# Patient Record
Sex: Female | Born: 1960 | Race: White | Hispanic: No | Marital: Single | Smoking: Former smoker
Health system: Southern US, Community
[De-identification: ages and names within clinical notes are randomized; demographics above are authoritative.]

## PROBLEM LIST (undated history)

## (undated) DIAGNOSIS — I2510 Atherosclerotic heart disease of native coronary artery without angina pectoris: Secondary | ICD-10-CM

## (undated) DIAGNOSIS — I509 Heart failure, unspecified: Secondary | ICD-10-CM

## (undated) DIAGNOSIS — E119 Type 2 diabetes mellitus without complications: Secondary | ICD-10-CM

## (undated) DIAGNOSIS — F200 Paranoid schizophrenia: Secondary | ICD-10-CM

## (undated) DIAGNOSIS — F79 Unspecified intellectual disabilities: Secondary | ICD-10-CM

## (undated) DIAGNOSIS — E785 Hyperlipidemia, unspecified: Secondary | ICD-10-CM

## (undated) DIAGNOSIS — I10 Essential (primary) hypertension: Secondary | ICD-10-CM

## (undated) DIAGNOSIS — E079 Disorder of thyroid, unspecified: Secondary | ICD-10-CM

## (undated) DIAGNOSIS — I219 Acute myocardial infarction, unspecified: Secondary | ICD-10-CM

## (undated) HISTORY — PX: FOOT SURGERY: SHX648

## (undated) HISTORY — DX: Type 2 diabetes mellitus without complications: E11.9

## (undated) HISTORY — DX: Acute myocardial infarction, unspecified: I21.9

---

## 2003-11-11 ENCOUNTER — Other Ambulatory Visit: Payer: Self-pay

## 2004-08-06 ENCOUNTER — Inpatient Hospital Stay: Payer: Self-pay | Admitting: Unknown Physician Specialty

## 2005-03-25 ENCOUNTER — Inpatient Hospital Stay: Payer: Self-pay | Admitting: Psychiatry

## 2005-04-25 ENCOUNTER — Inpatient Hospital Stay: Payer: Self-pay | Admitting: Internal Medicine

## 2005-04-25 ENCOUNTER — Other Ambulatory Visit: Payer: Self-pay

## 2005-04-26 ENCOUNTER — Other Ambulatory Visit: Payer: Self-pay

## 2005-05-10 ENCOUNTER — Emergency Department: Payer: Self-pay | Admitting: General Practice

## 2006-05-08 ENCOUNTER — Other Ambulatory Visit: Payer: Self-pay

## 2006-05-09 ENCOUNTER — Inpatient Hospital Stay: Payer: Self-pay | Admitting: Internal Medicine

## 2007-01-20 ENCOUNTER — Emergency Department: Payer: Self-pay | Admitting: Emergency Medicine

## 2007-03-08 DIAGNOSIS — E7220 Disorder of urea cycle metabolism, unspecified: Secondary | ICD-10-CM | POA: Insufficient documentation

## 2007-03-08 DIAGNOSIS — F319 Bipolar disorder, unspecified: Secondary | ICD-10-CM | POA: Insufficient documentation

## 2007-03-08 DIAGNOSIS — R000 Tachycardia, unspecified: Secondary | ICD-10-CM

## 2007-03-08 DIAGNOSIS — I10 Essential (primary) hypertension: Secondary | ICD-10-CM | POA: Insufficient documentation

## 2007-03-08 DIAGNOSIS — I255 Ischemic cardiomyopathy: Secondary | ICD-10-CM | POA: Insufficient documentation

## 2007-05-07 DIAGNOSIS — M5410 Radiculopathy, site unspecified: Secondary | ICD-10-CM | POA: Insufficient documentation

## 2007-05-22 ENCOUNTER — Encounter: Payer: Self-pay | Admitting: Family Medicine

## 2007-06-13 ENCOUNTER — Ambulatory Visit: Payer: Self-pay | Admitting: Family Medicine

## 2008-05-21 ENCOUNTER — Ambulatory Visit: Payer: Self-pay | Admitting: Family Medicine

## 2008-06-13 ENCOUNTER — Ambulatory Visit: Payer: Self-pay | Admitting: Family Medicine

## 2009-07-15 ENCOUNTER — Ambulatory Visit: Payer: Self-pay | Admitting: Family Medicine

## 2009-07-17 ENCOUNTER — Ambulatory Visit: Payer: Self-pay | Admitting: Family Medicine

## 2009-07-21 DIAGNOSIS — R928 Other abnormal and inconclusive findings on diagnostic imaging of breast: Secondary | ICD-10-CM | POA: Insufficient documentation

## 2010-02-01 ENCOUNTER — Emergency Department: Payer: Self-pay | Admitting: Emergency Medicine

## 2010-07-20 ENCOUNTER — Ambulatory Visit: Payer: Self-pay | Admitting: Family Medicine

## 2010-08-03 ENCOUNTER — Ambulatory Visit: Payer: Self-pay | Admitting: Emergency Medicine

## 2010-10-25 ENCOUNTER — Emergency Department: Payer: Self-pay | Admitting: Emergency Medicine

## 2011-04-06 ENCOUNTER — Observation Stay: Payer: Self-pay | Admitting: Internal Medicine

## 2011-04-06 LAB — COMPREHENSIVE METABOLIC PANEL
Alkaline Phosphatase: 76 U/L (ref 50–136)
BUN: 22 mg/dL — ABNORMAL HIGH (ref 7–18)
Bilirubin,Total: 0.2 mg/dL (ref 0.2–1.0)
Calcium, Total: 9.5 mg/dL (ref 8.5–10.1)
Chloride: 106 mmol/L (ref 98–107)
Co2: 26 mmol/L (ref 21–32)
EGFR (African American): >60
EGFR (Non-African Amer.): 51 — ABNORMAL LOW
Potassium: 4.4 mmol/L (ref 3.5–5.1)
SGOT(AST): 12 U/L — ABNORMAL LOW (ref 15–37)
SGPT (ALT): 12 U/L
Total Protein: 6.6 g/dL (ref 6.4–8.2)

## 2011-04-06 LAB — CBC
HGB: 10.6 g/dL — ABNORMAL LOW (ref 12.0–16.0)
MCHC: 32.8 g/dL (ref 32.0–36.0)
Platelet: 409 10*3/uL (ref 150–440)
RDW: 14.6 % — ABNORMAL HIGH (ref 11.5–14.5)
WBC: 8.5 10*3/uL (ref 3.6–11.0)

## 2011-04-06 LAB — CK TOTAL AND CKMB (NOT AT ARMC)
CK, Total: 35 U/L (ref 21–215)
CK-MB: <0.5 ng/mL — ABNORMAL LOW (ref 0.5–3.6)

## 2011-04-06 LAB — TROPONIN I
Troponin-I: <0.02 ng/mL
Troponin-I: <0.02 ng/mL

## 2011-04-07 LAB — BASIC METABOLIC PANEL
Anion Gap: 7 (ref 7–16)
BUN: 21 mg/dL — ABNORMAL HIGH (ref 7–18)
Co2: 26 mmol/L (ref 21–32)
EGFR (African American): >60
EGFR (Non-African Amer.): 51 — ABNORMAL LOW
Glucose: 98 mg/dL (ref 65–99)
Osmolality: 282 (ref 275–301)

## 2011-04-07 LAB — LIPID PANEL
HDL Cholesterol: 48 mg/dL (ref 40–60)
VLDL Cholesterol, Calc: 23 mg/dL (ref 5–40)

## 2011-04-07 LAB — TROPONIN I: Troponin-I: <0.02 ng/mL

## 2011-04-07 LAB — CK TOTAL AND CKMB (NOT AT ARMC)
CK, Total: 30 U/L (ref 21–215)
CK-MB: <0.5 ng/mL — ABNORMAL LOW (ref 0.5–3.6)

## 2011-04-13 DIAGNOSIS — K219 Gastro-esophageal reflux disease without esophagitis: Secondary | ICD-10-CM | POA: Insufficient documentation

## 2011-04-13 DIAGNOSIS — K5900 Constipation, unspecified: Secondary | ICD-10-CM | POA: Insufficient documentation

## 2011-04-13 DIAGNOSIS — E669 Obesity, unspecified: Secondary | ICD-10-CM | POA: Insufficient documentation

## 2011-08-27 ENCOUNTER — Emergency Department: Payer: Self-pay | Admitting: Emergency Medicine

## 2011-08-27 LAB — CK TOTAL AND CKMB (NOT AT ARMC)
CK, Total: 34 U/L (ref 21–215)
CK-MB: 0.5 ng/mL (ref 0.5–3.6)

## 2011-08-27 LAB — COMPREHENSIVE METABOLIC PANEL
Albumin: 3.4 g/dL (ref 3.4–5.0)
Alkaline Phosphatase: 115 U/L (ref 50–136)
Anion Gap: 7 (ref 7–16)
BUN: 19 mg/dL — ABNORMAL HIGH (ref 7–18)
Bilirubin,Total: 0.2 mg/dL (ref 0.2–1.0)
Calcium, Total: 9.4 mg/dL (ref 8.5–10.1)
Co2: 25 mmol/L (ref 21–32)
Creatinine: 1.2 mg/dL (ref 0.60–1.30)
EGFR (Non-African Amer.): 52 — ABNORMAL LOW
Glucose: 109 mg/dL — ABNORMAL HIGH (ref 65–99)
Potassium: 4.6 mmol/L (ref 3.5–5.1)
SGOT(AST): 12 U/L — ABNORMAL LOW (ref 15–37)
SGPT (ALT): 16 U/L
Sodium: 137 mmol/L (ref 136–145)

## 2011-08-27 LAB — TROPONIN I: Troponin-I: <0.02 ng/mL

## 2011-08-27 LAB — CBC
HCT: 33.6 % — ABNORMAL LOW (ref 35.0–47.0)
HGB: 10.6 g/dL — ABNORMAL LOW (ref 12.0–16.0)
MCH: 27.5 pg (ref 26.0–34.0)
Platelet: 374 10*3/uL (ref 150–440)
RBC: 3.84 10*6/uL (ref 3.80–5.20)
RDW: 15.1 % — ABNORMAL HIGH (ref 11.5–14.5)

## 2011-09-16 ENCOUNTER — Ambulatory Visit: Payer: Self-pay | Admitting: Family Medicine

## 2012-01-06 ENCOUNTER — Emergency Department: Payer: Self-pay | Admitting: Emergency Medicine

## 2012-03-22 ENCOUNTER — Emergency Department: Payer: Self-pay | Admitting: Emergency Medicine

## 2012-03-22 LAB — CBC
HGB: 11.2 g/dL — ABNORMAL LOW (ref 12.0–16.0)
MCH: 26.9 pg (ref 26.0–34.0)
MCHC: 31.5 g/dL — ABNORMAL LOW (ref 32.0–36.0)
MCV: 85 fL (ref 80–100)
Platelet: 415 10*3/uL (ref 150–440)
RBC: 4.14 10*6/uL (ref 3.80–5.20)

## 2012-03-22 LAB — BASIC METABOLIC PANEL
Anion Gap: 8 (ref 7–16)
Chloride: 109 mmol/L — ABNORMAL HIGH (ref 98–107)
Co2: 22 mmol/L (ref 21–32)
Creatinine: 1.15 mg/dL (ref 0.60–1.30)
EGFR (African American): >60
EGFR (Non-African Amer.): 55 — ABNORMAL LOW
Sodium: 139 mmol/L (ref 136–145)

## 2012-03-22 LAB — TROPONIN I: Troponin-I: <0.02 ng/mL

## 2012-03-22 LAB — CK TOTAL AND CKMB (NOT AT ARMC): CK-MB: <0.5 ng/mL — ABNORMAL LOW (ref 0.5–3.6)

## 2012-04-09 ENCOUNTER — Ambulatory Visit: Payer: Self-pay | Admitting: Internal Medicine

## 2012-06-29 ENCOUNTER — Ambulatory Visit: Payer: Self-pay | Admitting: Family Medicine

## 2012-06-29 DIAGNOSIS — M79671 Pain in right foot: Secondary | ICD-10-CM | POA: Insufficient documentation

## 2012-08-30 DIAGNOSIS — Z0000 Encounter for general adult medical examination without abnormal findings: Secondary | ICD-10-CM | POA: Insufficient documentation

## 2012-10-09 ENCOUNTER — Ambulatory Visit: Payer: Self-pay | Admitting: Family Medicine

## 2013-01-17 DIAGNOSIS — W19XXXA Unspecified fall, initial encounter: Secondary | ICD-10-CM | POA: Insufficient documentation

## 2013-01-24 ENCOUNTER — Encounter: Payer: Self-pay | Admitting: Podiatrist

## 2013-01-24 ENCOUNTER — Ambulatory Visit (INDEPENDENT_AMBULATORY_CARE_PROVIDER_SITE_OTHER): Payer: Medicare Other | Admitting: Podiatrist

## 2013-01-24 VITALS — BP 99/50 | HR 82 | Resp 16 | Ht 68.0 in | Wt 185.0 lb

## 2013-01-24 DIAGNOSIS — B351 Tinea unguium: Secondary | ICD-10-CM

## 2013-01-24 DIAGNOSIS — M79609 Pain in unspecified limb: Secondary | ICD-10-CM

## 2013-01-24 NOTE — Progress Notes (Signed)
Plantar fasciitis left.  General swelling, pulses ok.  Pes planus Subjective: Candida presents today for generalized diabetic foot examination and for care of painful toenails. She states it at work her feet are hurting and they or swelling. She relates heel pain on the left heel.  Objective: Vascular status is intact with palpable and symmetric pedal pulses at 2+ out of 4 DP and PT bilateral. She does have decreased neurological sensation via Semmes Weinstein monofilament at 3/5 sites bilateral. She has a pes planovalgus foot type bilaterally with discomfort at the plantar medial arch left. Mild swelling is noted bilateral. Patient's toenails bilateral are thickened, discolored, dystrophic, clinically mycotic and painful.  Assessment: Diabetes with neuropathy, plantar fasciitis left, painful mycotic nails  Plan: Debrided the patient's toenails without complication. Recommended diabetic shoes and inserts and a prescription was written for her. She will obtain these to [facility]. She will be seen back for recheck as needed.

## 2013-01-24 NOTE — Patient Instructions (Signed)
Diabetes and Foot Care Diabetes may cause you to have problems because of poor blood supply (circulation) to your feet and legs. This may cause the skin on your feet to become thinner, break easier, and heal more slowly. Your skin may become dry, and the skin may peel and crack. You may also have nerve damage in your legs and feet causing decreased feeling in them. You may not notice minor injuries to your feet that could lead to infections or more serious problems. Taking care of your feet is one of the most important things you can do for yourself.  HOME CARE INSTRUCTIONS  Wear shoes at all times, even in the house. Do not go barefoot. Bare feet are easily injured.  Check your feet daily for blisters, cuts, and redness. If you cannot see the bottom of your feet, use a mirror or ask someone for help.  Wash your feet with warm water (do not use hot water) and mild soap. Then pat your feet and the areas between your toes until they are completely dry. Do not soak your feet as this can dry your skin.  Apply a moisturizing lotion or petroleum jelly (that does not contain alcohol and is unscented) to the skin on your feet and to dry, brittle toenails. Do not apply lotion between your toes.  Trim your toenails straight across. Do not dig under them or around the cuticle. File the edges of your nails with an emery board or nail file.  Do not cut corns or calluses or try to remove them with medicine.  Wear clean socks or stockings every day. Make sure they are not too tight. Do not wear knee-high stockings since they may decrease blood flow to your legs.  Wear shoes that fit properly and have enough cushioning. To break in new shoes, wear them for just a few hours a day. This prevents you from injuring your feet. Always look in your shoes before you put them on to be sure there are no objects inside.  Do not cross your legs. This may decrease the blood flow to your feet.  If you find a minor scrape,  cut, or break in the skin on your feet, keep it and the skin around it clean and dry. These areas may be cleansed with mild soap and water. Do not cleanse the area with peroxide, alcohol, or iodine.  When you remove an adhesive bandage, be sure not to damage the skin around it.  If you have a wound, look at it several times a day to make sure it is healing.  Do not use heating pads or hot water bottles. They may burn your skin. If you have lost feeling in your feet or legs, you may not know it is happening until it is too late.  Make sure your health care provider performs a complete foot exam at least annually or more often if you have foot problems. Report any cuts, sores, or bruises to your health care provider immediately. SEEK MEDICAL CARE IF:   You have an injury that is not healing.  You have cuts or breaks in the skin.  You have an ingrown nail.  You notice redness on your legs or feet.  You feel burning or tingling in your legs or feet.  You have pain or cramps in your legs and feet.  Your legs or feet are numb.  Your feet always feel cold. SEEK IMMEDIATE MEDICAL CARE IF:   There is increasing redness,   swelling, or pain in or around a wound.  There is a red line that goes up your leg.  Pus is coming from a wound.  You develop a fever or as directed by your health care provider.  You notice a bad smell coming from an ulcer or wound. Document Released: 03/04/2000 Document Revised: 11/07/2012 Document Reviewed: 08/14/2012 ExitCare Patient Information 2014 [employer].  

## 2013-02-06 ENCOUNTER — Encounter: Payer: Self-pay | Admitting: Family Medicine

## 2013-02-18 ENCOUNTER — Encounter: Payer: Self-pay | Admitting: Family Medicine

## 2013-03-17 DIAGNOSIS — D638 Anemia in other chronic diseases classified elsewhere: Secondary | ICD-10-CM | POA: Insufficient documentation

## 2013-03-21 ENCOUNTER — Encounter: Payer: Self-pay | Admitting: Family Medicine

## 2013-08-30 DIAGNOSIS — G4700 Insomnia, unspecified: Secondary | ICD-10-CM | POA: Insufficient documentation

## 2013-09-04 ENCOUNTER — Inpatient Hospital Stay: Payer: Self-pay | Admitting: Psychiatry

## 2013-09-04 LAB — CBC
HCT: 35 % (ref 35.0–47.0)
HGB: 10.9 g/dL — AB (ref 12.0–16.0)
MCH: 26.4 pg (ref 26.0–34.0)
MCHC: 31.1 g/dL — ABNORMAL LOW (ref 32.0–36.0)
MCV: 85 fL (ref 80–100)
PLATELETS: 319 10*3/uL (ref 150–440)
RBC: 4.12 10*6/uL (ref 3.80–5.20)
RDW: 15.3 % — ABNORMAL HIGH (ref 11.5–14.5)
WBC: 5.5 10*3/uL (ref 3.6–11.0)

## 2013-09-04 LAB — URINALYSIS, COMPLETE
BACTERIA: NONE SEEN
BILIRUBIN, UR: NEGATIVE
Blood: NEGATIVE
Glucose,UR: NEGATIVE mg/dL (ref 0–75)
KETONE: NEGATIVE
Nitrite: NEGATIVE
Ph: 6 (ref 4.5–8.0)
Protein: NEGATIVE
RBC,UR: <1 /HPF (ref 0–5)
Specific Gravity: 1.004 (ref 1.003–1.030)
Squamous Epithelial: <1
WBC UR: 2 /HPF (ref 0–5)

## 2013-09-04 LAB — DRUG SCREEN, URINE
AMPHETAMINES, UR SCREEN: NEGATIVE (ref ?–1000)
Barbiturates, Ur Screen: NEGATIVE (ref ?–200)
Benzodiazepine, Ur Scrn: NEGATIVE (ref ?–200)
CANNABINOID 50 NG, UR ~~LOC~~: NEGATIVE (ref ?–50)
COCAINE METABOLITE, UR ~~LOC~~: NEGATIVE (ref ?–300)
MDMA (Ecstasy)Ur Screen: NEGATIVE (ref ?–500)
METHADONE, UR SCREEN: NEGATIVE (ref ?–300)
OPIATE, UR SCREEN: NEGATIVE (ref ?–300)
PHENCYCLIDINE (PCP) UR S: NEGATIVE (ref ?–25)
Tricyclic, Ur Screen: NEGATIVE (ref ?–1000)

## 2013-09-04 LAB — COMPREHENSIVE METABOLIC PANEL
ALT: 11 U/L — AB (ref 12–78)
Albumin: 2.9 g/dL — ABNORMAL LOW (ref 3.4–5.0)
Alkaline Phosphatase: 82 U/L
Anion Gap: 7 (ref 7–16)
BUN: 20 mg/dL — AB (ref 7–18)
Bilirubin,Total: 0.2 mg/dL (ref 0.2–1.0)
Calcium, Total: 9.7 mg/dL (ref 8.5–10.1)
Chloride: 105 mmol/L (ref 98–107)
Co2: 24 mmol/L (ref 21–32)
Creatinine: 0.95 mg/dL (ref 0.60–1.30)
EGFR (African American): >60
EGFR (Non-African Amer.): >60
GLUCOSE: 135 mg/dL — AB (ref 65–99)
OSMOLALITY: 277 (ref 275–301)
POTASSIUM: 4.5 mmol/L (ref 3.5–5.1)
SGOT(AST): 11 U/L — ABNORMAL LOW (ref 15–37)
SODIUM: 136 mmol/L (ref 136–145)
Total Protein: 7.1 g/dL (ref 6.4–8.2)

## 2013-09-04 LAB — ETHANOL: Ethanol: <3 mg/dL

## 2013-09-04 LAB — TSH: THYROID STIMULATING HORM: 1.9 u[IU]/mL

## 2013-09-04 LAB — ACETAMINOPHEN LEVEL: Acetaminophen: <2 ug/mL

## 2013-09-04 LAB — LITHIUM LEVEL: Lithium: <0.2 mmol/L — ABNORMAL LOW

## 2013-09-04 LAB — SALICYLATE LEVEL

## 2013-10-04 ENCOUNTER — Ambulatory Visit: Payer: Self-pay | Admitting: Family Medicine

## 2013-10-09 DIAGNOSIS — E782 Mixed hyperlipidemia: Secondary | ICD-10-CM | POA: Insufficient documentation

## 2013-10-09 DIAGNOSIS — R0602 Shortness of breath: Secondary | ICD-10-CM | POA: Insufficient documentation

## 2013-12-05 ENCOUNTER — Inpatient Hospital Stay: Payer: Self-pay | Admitting: Internal Medicine

## 2013-12-05 LAB — CBC
HCT: 35.2 % (ref 35.0–47.0)
HGB: 11.1 g/dL — ABNORMAL LOW (ref 12.0–16.0)
MCH: 27.8 pg (ref 26.0–34.0)
MCHC: 31.6 g/dL — AB (ref 32.0–36.0)
MCV: 88 fL (ref 80–100)
Platelet: 399 10*3/uL (ref 150–440)
RBC: 4 10*6/uL (ref 3.80–5.20)
RDW: 16.1 % — ABNORMAL HIGH (ref 11.5–14.5)
WBC: 8 10*3/uL (ref 3.6–11.0)

## 2013-12-05 LAB — COMPREHENSIVE METABOLIC PANEL
ALT: 13 U/L — AB
ANION GAP: 5 — AB (ref 7–16)
Albumin: 3.2 g/dL — ABNORMAL LOW (ref 3.4–5.0)
Alkaline Phosphatase: 101 U/L
BILIRUBIN TOTAL: 0.2 mg/dL (ref 0.2–1.0)
BUN: 20 mg/dL — ABNORMAL HIGH (ref 7–18)
Calcium, Total: 9.7 mg/dL (ref 8.5–10.1)
Chloride: 107 mmol/L (ref 98–107)
Co2: 26 mmol/L (ref 21–32)
Creatinine: 1.16 mg/dL (ref 0.60–1.30)
EGFR (African American): >60
EGFR (Non-African Amer.): 54 — ABNORMAL LOW
Glucose: 122 mg/dL — ABNORMAL HIGH (ref 65–99)
Osmolality: 280 (ref 275–301)
Potassium: 4.4 mmol/L (ref 3.5–5.1)
SGOT(AST): 20 U/L (ref 15–37)
SODIUM: 138 mmol/L (ref 136–145)
TOTAL PROTEIN: 7.3 g/dL (ref 6.4–8.2)

## 2013-12-05 LAB — TROPONIN I: Troponin-I: <0.02 ng/mL

## 2013-12-05 LAB — D-DIMER(ARMC): D-DIMER: 540 ng/mL

## 2013-12-13 DIAGNOSIS — R079 Chest pain, unspecified: Secondary | ICD-10-CM | POA: Insufficient documentation

## 2013-12-18 DIAGNOSIS — G4733 Obstructive sleep apnea (adult) (pediatric): Secondary | ICD-10-CM | POA: Insufficient documentation

## 2013-12-20 DIAGNOSIS — Z5181 Encounter for therapeutic drug level monitoring: Secondary | ICD-10-CM | POA: Insufficient documentation

## 2014-01-15 ENCOUNTER — Ambulatory Visit: Payer: Self-pay | Admitting: Family Medicine

## 2014-04-23 ENCOUNTER — Inpatient Hospital Stay: Payer: Self-pay | Admitting: Psychiatry

## 2014-04-23 LAB — ETHANOL

## 2014-04-23 LAB — URINALYSIS, COMPLETE
BLOOD: NEGATIVE
Bacteria: NONE SEEN
Bilirubin,UR: NEGATIVE
Glucose,UR: NEGATIVE mg/dL (ref 0–75)
Ketone: NEGATIVE
Leukocyte Esterase: NEGATIVE
NITRITE: NEGATIVE
PH: 7 (ref 4.5–8.0)
PROTEIN: NEGATIVE
RBC, UR: NONE SEEN /HPF (ref 0–5)
Specific Gravity: 1.003 (ref 1.003–1.030)
Squamous Epithelial: <1
WBC UR: <1 /HPF (ref 0–5)

## 2014-04-23 LAB — COMPREHENSIVE METABOLIC PANEL
ALBUMIN: 3.1 g/dL — AB (ref 3.4–5.0)
ALK PHOS: 125 U/L — AB (ref 46–116)
Anion Gap: 5 — ABNORMAL LOW (ref 7–16)
BUN: 14 mg/dL (ref 7–18)
Bilirubin,Total: 0.2 mg/dL (ref 0.2–1.0)
Calcium, Total: 9.6 mg/dL (ref 8.5–10.1)
Chloride: 107 mmol/L (ref 98–107)
Co2: 27 mmol/L (ref 21–32)
Creatinine: 1.08 mg/dL (ref 0.60–1.30)
EGFR (African American): >60
GFR CALC NON AF AMER: 56 — AB
GLUCOSE: 161 mg/dL — AB (ref 65–99)
OSMOLALITY: 281 (ref 275–301)
Potassium: 4.6 mmol/L (ref 3.5–5.1)
SGOT(AST): 15 U/L (ref 15–37)
SGPT (ALT): 13 U/L — ABNORMAL LOW (ref 14–63)
SODIUM: 139 mmol/L (ref 136–145)
Total Protein: 7.2 g/dL (ref 6.4–8.2)

## 2014-04-23 LAB — SALICYLATE LEVEL

## 2014-04-23 LAB — DRUG SCREEN, URINE

## 2014-04-23 LAB — CBC
HCT: 36.1 % (ref 35.0–47.0)
HGB: 11.3 g/dL — ABNORMAL LOW (ref 12.0–16.0)
MCH: 27.5 pg (ref 26.0–34.0)
MCHC: 31.3 g/dL — ABNORMAL LOW (ref 32.0–36.0)
MCV: 88 fL (ref 80–100)
PLATELETS: 406 10*3/uL (ref 150–440)
RBC: 4.11 10*6/uL (ref 3.80–5.20)
RDW: 14.5 % (ref 11.5–14.5)
WBC: 8.3 10*3/uL (ref 3.6–11.0)

## 2014-04-23 LAB — TSH: THYROID STIMULATING HORM: 2.45 u[IU]/mL

## 2014-04-23 LAB — ACETAMINOPHEN LEVEL: Acetaminophen: <2 ug/mL

## 2014-04-23 LAB — LITHIUM LEVEL: LITHIUM: 1.23 mmol/L — AB

## 2014-04-24 LAB — LITHIUM LEVEL: Lithium: 0.71 mmol/L

## 2014-04-28 LAB — LITHIUM LEVEL: LITHIUM: 0.52 mmol/L — AB

## 2014-05-14 DIAGNOSIS — R600 Localized edema: Secondary | ICD-10-CM | POA: Insufficient documentation

## 2014-05-14 DIAGNOSIS — I5022 Chronic systolic (congestive) heart failure: Secondary | ICD-10-CM | POA: Insufficient documentation

## 2014-05-14 DIAGNOSIS — I5042 Chronic combined systolic (congestive) and diastolic (congestive) heart failure: Secondary | ICD-10-CM | POA: Insufficient documentation

## 2014-05-14 DIAGNOSIS — I071 Rheumatic tricuspid insufficiency: Secondary | ICD-10-CM | POA: Insufficient documentation

## 2014-05-26 DIAGNOSIS — E1140 Type 2 diabetes mellitus with diabetic neuropathy, unspecified: Secondary | ICD-10-CM | POA: Insufficient documentation

## 2014-06-12 ENCOUNTER — Ambulatory Visit (INDEPENDENT_AMBULATORY_CARE_PROVIDER_SITE_OTHER): Payer: Medicare Other | Admitting: Podiatry

## 2014-06-12 DIAGNOSIS — B351 Tinea unguium: Secondary | ICD-10-CM

## 2014-06-12 DIAGNOSIS — M79676 Pain in unspecified toe(s): Secondary | ICD-10-CM

## 2014-06-12 NOTE — Patient Instructions (Signed)
Diabetes and Foot Care Diabetes may cause you to have problems because of poor blood supply (circulation) to your feet and legs. This may cause the skin on your feet to become thinner, break easier, and heal more slowly. Your skin may become dry, and the skin may peel and crack. You may also have nerve damage in your legs and feet causing decreased feeling in them. You may not notice minor injuries to your feet that could lead to infections or more serious problems. Taking care of your feet is one of the most important things you can do for yourself.  HOME CARE INSTRUCTIONS  Wear shoes at all times, even in the house. Do not go barefoot. Bare feet are easily injured.  Check your feet daily for blisters, cuts, and redness. If you cannot see the bottom of your feet, use a mirror or ask someone for help.  Wash your feet with warm water (do not use hot water) and mild soap. Then pat your feet and the areas between your toes until they are completely dry. Do not soak your feet as this can dry your skin.  Apply a moisturizing lotion or petroleum jelly (that does not contain alcohol and is unscented) to the skin on your feet and to dry, brittle toenails. Do not apply lotion between your toes.  Trim your toenails straight across. Do not dig under them or around the cuticle. File the edges of your nails with an emery board or nail file.  Do not cut corns or calluses or try to remove them with medicine.  Wear clean socks or stockings every day. Make sure they are not too tight. Do not wear knee-high stockings since they may decrease blood flow to your legs.  Wear shoes that fit properly and have enough cushioning. To break in new shoes, wear them for just a few hours a day. This prevents you from injuring your feet. Always look in your shoes before you put them on to be sure there are no objects inside.  Do not cross your legs. This may decrease the blood flow to your feet.  If you find a minor scrape,  cut, or break in the skin on your feet, keep it and the skin around it clean and dry. These areas may be cleansed with mild soap and water. Do not cleanse the area with peroxide, alcohol, or iodine.  When you remove an adhesive bandage, be sure not to damage the skin around it.  If you have a wound, look at it several times a day to make sure it is healing.  Do not use heating pads or hot water bottles. They may burn your skin. If you have lost feeling in your feet or legs, you may not know it is happening until it is too late.  Make sure your health care provider performs a complete foot exam at least annually or more often if you have foot problems. Report any cuts, sores, or bruises to your health care provider immediately. SEEK MEDICAL CARE IF:   You have an injury that is not healing.  You have cuts or breaks in the skin.  You have an ingrown nail.  You notice redness on your legs or feet.  You feel burning or tingling in your legs or feet.  You have pain or cramps in your legs and feet.  Your legs or feet are numb.  Your feet always feel cold. SEEK IMMEDIATE MEDICAL CARE IF:   There is increasing redness,   swelling, or pain in or around a wound.  There is a red line that goes up your leg.  Pus is coming from a wound.  You develop a fever or as directed by your health care provider.  You notice a bad smell coming from an ulcer or wound. Document Released: 03/04/2000 Document Revised: 11/07/2012 Document Reviewed: 08/14/2012 ExitCare Patient Information 2015 [employer]. This information is not intended to replace advice given to you by your health care provider. Make sure you discuss any questions you have with your health care provider.  

## 2014-06-15 NOTE — Progress Notes (Signed)
Patient ID: Elizabeth Gutierrez, female   DOB: 02-22-1961, 54 y.o.   MRN: [id number]  Subjective: 54 y.o.-year-old female returns the office today for painful, elongated, thickened toenails which she is unable to trim herself. Denies any redness or drainage around the nails. Elizabeth Gutierrez states that she gets intermittent pain to her feet and she believes it may be her shoe gear. She is inquiring about possible diabetic shoes. She denies any history of injury or trauma and she denies any swelling or redness. She denies any pain to her feet at this time. Denies any acute changes since last appointment and no new complaints today. Denies any systemic complaints such as fevers, chills, nausea, vomiting.   Objective: AAO 3, NAD DP/PT pulses palpable, CRT less than 3 seconds Protective sensation decreased with Simms Weinstein monofilament, vibratory sensation decreased, Achilles tendon reflex intact.  Nails hypertrophic, dystrophic, elongated, brittle, discolored 10. There is tenderness overlying the nails 1-5 bilaterally. There is no surrounding erythema or drainage along the nail sites. No open lesions or pre-ulcerative lesions are identified. No other areas of tenderness bilateral lower extremities at this time. There is a decrease in medial arch upon weightbearing bilaterally and hammertoe contractures present. No overlying edema, erythema, increased warmth. MMT 5/5, ROM WNL No pain with calf compression, swelling, warmth, erythema.  Assessment: Patient presents with symptomatic onychomycosis  Plan: -Treatment options including alternatives, risks, complications were discussed -Nails sharply debrided 10 without complication/bleeding. -Paperwork was completed for precertification of diabetic shoes. -Discussed daily foot inspection. If there are any changes, to call the office immediately.  -Follow-up in 3 months or sooner if any problems are to arise. In the meantime, encouraged to call the office with any  questions, concerns, changes symptoms.

## 2014-07-08 ENCOUNTER — Telehealth: Payer: Self-pay | Admitting: Podiatry

## 2014-07-08 NOTE — Telephone Encounter (Signed)
LEFT MESSAGE FOR PATIENT TO CALL AND SET UP APPT TO BE SCAN FOR DIABETIC SHOES

## 2014-07-12 NOTE — H&P (Signed)
[name], Elizabeth Gutierrez MR#:  [id number] DATE OF BIRTH:  06-Oct-1960  DATE OF ADMISSION:  12/05/2013  PRIMARY CARE PHYSICIAN: Denton Lank, MD  CHIEF COMPLAINT: Left-sided chest pain.   HISTORY OF PRESENT ILLNESS: This is a 54 year old Caucasian female patient with history of schizophrenia, diabetes, hypertension, CAD, hyperlipidemia, hypothyroidism, and ischemic cardiomyopathy presents to the Emergency Room from her group home with complaints of left-sided chest pain. The patient has not had any chest pains recently, although she is a poor historian, states that she has this chest pain which started earlier today without any shortness of breath, nausea, vomiting. No diarrhea or constipation. Her aide who takes care of her at the group home mentions that she has not mentioned chest pain in months other than today. Reviewing old records, patient had an non-ST segment elevation myocardial infarction with troponin as high as 30 in 2008 when cardiac catheterization was advised by Dr. Nehemiah Massed, but the patient refused this and was discharged home. After this, she again was admitted in 2013, had a Myoview Lexiscan test when EF was 40% with septal and apical fixed hypokinesis and no reversible ischemia found, and was discharged back home. She continues to be on aspirin, statin, and beta blocker, seems to have done well until today when she had chest pain. Today, she has had 2 sets of troponin with less than 0.02 but a CTA of the chest which was done for pulmonary embolism has suggested and LAD calcified plaque and the patient is being admitted to the hospitalist service. Presently, the patient still continues to complain of some mild left-sided chest pain. With her being a poor historian, it is difficult to quantify how bad the pain is and the quality.   PAST MEDICAL HISTORY:  1.  Diabetes mellitus type 2.  2.  Hypertension.  3.  Schizophrenia. 4.  Hyperlipidemia. 5.  Ischemic cardiomyopathy with EF of  40%.  6.  Hypothyroidism.   ALLERGIES: BEE STINGS. NO DRUG-RELATED ALLERGIES.   SOCIAL HISTORY: The patient lives in a group home. She used to use snuff in the past but has quit. No alcohol. No illicit drug use. Ambulates on her own.   CODE STATUS: Full code. She does have her mother who lives in [place], but the patient makes her own decisions.   FAMILY HISTORY: Diabetes and stomach cancer in her grandmother.   REVIEW OF SYSTEMS:  CONSTITUTIONAL: No fever, fatigue, weakness.  EYES: No blurry vision, tinnitus, hearing loss.  RESPIRATORY: No cough, wheeze, hemoptysis.  CARDIOVASCULAR: Has chest pain. No edema.  GASTROINTESTINAL: No nausea, vomiting, diarrhea, abdominal pain.  GENITOURINARY: No dysuria, hematuria or frequency.  ENDOCRINE: No polyuria, nocturia, thyroid problems.  HEMATOLOGIC AND LYMPHATIC: No anemia, easy bruising, bleeding. INTEGUMENTARY: No acne, rash, lesion.  MUSCULOSKELETAL: Has arthritis.  NEUROLOGIC: No focal numbness, weakness.  PSYCHIATRIC: Has schizoaffective disorder, schizophrenia.   HOME MEDICATIONS:  1.  Aspirin 81 mg daily.  2.  Lipitor 20 mg daily.  3.  Abilify 30 mg daily.  4.  Actos 45 mg daily.  5.  Ambien 10 mg oral at bedtime.  6.  Ativan 0.5 mg b.i.d.  7.  Benztropine 0.5 mg b.i.d.  8.  Coreg 3.125 mg 2 times a day.  9.  Colace 100 mg 2 times a day.  10.  Fluphenazine injection intramuscular every 2 weeks 1 mL.  11.  Lasix 20 mg daily.  12.  Lithium 450 mg b.i.d.  13.  Meloxicam 7.5 mg daily.  14.  Metformin 500 mg once a day.  15.  MiraLax 17 grams oral on Monday, Wednesday, Friday.  16.  Prilosec 20 daily.  17.  Senokot 1 tablet. 18.  Sertraline 100 mg daily.  19.  Synthroid 50 mcg daily.   PHYSICAL EXAMINATION:  VITAL SIGNS: Shows temperature 98.8, pulse of 61, blood pressure 116/69, saturating 99% on room air.  GENERAL: Obese, Caucasian, female patient lying in bed, seems comfortable at this point.  PSYCHIATRIC: Alert, oriented  with labile affect.  HEENT: Atraumatic, normocephalic. Oral mucosa moist and pink. External ears and nose normal. No pallor. No icterus.  NECK: Supple. No thyromegaly or palpable lymph nodes. Trachea midline. No carotid bruit or JVD.  CARDIOVASCULAR: S1, S2, without any murmurs. Peripheral pulses 2+. The patient does have some pleuritic component with chest wall tenderness in the left upper chest, although this is not reproducible on repetition.  RESPIRATORY: Normal work of breathing. Clear to auscultation.  GASTROINTESTINAL: Soft abdomen, nontender. Bowel sounds present. No hepatosplenomegaly palpable. SKIN: Warm and dry. No petechia, no ulcers found.  MUSCULOSKELETAL: No joint tenderness, enlarged joints. Normal muscle tone.  NEUROLOGICAL: Motor strength 5/5 in upper and lower extremities. Sensation is intact all over.  LYMPHATIC: No cervical lymphadenopathy.   LABORATORY AND IMAGING STUDIES: Show a CT of the chest with no evidence of pulmonary embolism. Has CAD with calcified plaque in the LAD. Probable hepatic steatosis and some cholelithiasis.  Troponin x 2 less than 0.02.   Glucose 132, BUN 20, creatinine 1.16, sodium 138, potassium 4.4. AST, ALT, alkaline phosphatase, bilirubin normal. Lithium level less than 0.2. TSH 1.9. Urine drug screen negative.   WBC 8, hemoglobin 11.1, platelets of 399,000 with D-dimer elevated at 540. Urinalysis normal.   Chest x-ray showed no acute abnormalities other than some mild cardiomegaly.   EKG showed normal sinus rhythm with left axis deviation, does have poor R wave progression with T-wave flattening in the lateral leads.   ASSESSMENT AND PLAN:  1.  Atypical chest pain in a patient with prior history of non-ST segment elevation myocardial infarction when she refused cardiac catheterization. The patient seems to have recurrent chest pain at this point. Her cardiac enzymes are normal, but would admit the patient onto the telemetry floor, considering  her risk factors. We will consult cardiology to consider cardiac catheterization. Initially, the patient was tearful when I suggested cardiac catheterization, but on explaining the procedure her main concerns were that she would be left with surgical marks or this would be an open thoracotomy procedure, but she has agreed for a cardiac catheterization if needed. We will await cardiology input. We will check another set of troponin, continue the aspirin, statin, beta blocker and ACE inhibitor the patient is on.  2.  Chronic systolic congestive heart failure with ejection fraction of 40%. Does not seem fluid overloaded. Continue her Lasix.  3.  Diabetes mellitus. Continue the patient's Actos, sliding scale insulin, diabetic diet.  4.  Hypertension. Continue medications.  5.  Deep vein thrombosis prophylaxis, put her on Lovenox.   CODE STATUS: Full code.   TIME SPENT TODAY ON THIS CASE: Forty minutes.   ____________________________ Leia Alf Treydon Henricks, MD srs:TT D: 12/05/2013 19:19:20 ET T: 12/05/2013 19:55:50 ET JOB#: [id number]  cc: Alveta Heimlich R. Darvin Neighbours, MD, <Dictator> Sarah "Sallie" Posey Pronto, MD Corey Skains, MD Neita Carp MD ELECTRONICALLY SIGNED 12/07/2013 14:33

## 2014-07-12 NOTE — Discharge Summary (Signed)
[name], Elizabeth Gutierrez MR#:  [id number] DATE OF BIRTH:  [birth year]  DATE OF ADMISSION:  09/04/2013 DATE OF DISCHARGE:  09/06/2013  HOSPITAL COURSE: See dictated history and physical for details of admission. This 54 year old woman with a history of schizoaffective disorder and probable developmental disability was brought to the Emergency Room saying that she was having suicidal thoughts and psychotic symptoms. She was complaining that she wanted to leave her group home. She was admitted to the hospital and treated with her usual psychiatric medicine as well as individual and group psychotherapy. The patient did not show any dangerous behavior in the hospital. She has denied suicidal ideation for the last couple of days. She is mostly focused on complaining about living at [facility] and saying that she wishes that she could live independently. It is documented that this is a chronic complaint of hers that has been going on for a long time. She is not currently showing or reporting any psychotic symptoms. She is tolerating medicine well. There does not seem to be acute dangerousness. At this point, she seems to have calmed down and is no longer indicating suicidality. We recommend discharge back home to a safe environment, and she will follow up with [facility].   MENTAL STATUS EXAM AT DISCHARGE: Slightly disheveled woman, looks her stated age, cooperative with the interview. Eye contact good. Psychomotor activity normal. Speech is a little bit hard to follow at times, especially when she starts crying but usually understandable. Affect is blunted, at times tearful when she is complaining about not being able to live independently but it rapidly comes back to normal. Mood is stated as okay. Thoughts are simplistic, slow and concrete. Denies hallucinations. No evidence of delusions. Denies suicidal or homicidal ideation. Alert and oriented x 4. Fund of knowledge impaired.   LABORATORY RESULTS: Admission labs  included a CBC with only a slightly low hemoglobin at 10.9, otherwise normal. Chemistry panel: Elevated glucose at 135, elevated BUN at 20. Low ALT and AST. Low albumin at 2.9. Lithium level checked which was less than 2. All of the drug screen was negative. Urinalysis negative.   DISCHARGE MEDICATIONS: Meloxicam 7.5 mg once a day, benazepril 10 mg once a day, sertraline 100 mg once a day, metformin 850 mg twice a day, pioglitazone 45 mg once a day, atorvastatin 20 mg at night, benztropine 0.5 mg twice a day, aspirin 81 mg once a day, lithium extended release 450 mg twice a day, fluphenazine decanoate 25 mg intramuscularly every 2 weeks, aripiprazole 30 mg once a day, Ativan 0.5 mg 2 times a day, carvedilol 3.125 mg twice a day, Lasix 20 mg once a day, Senna 8.6 mg at night, docusate 100 mg twice a day, MiraLAX 17 g once a day, omeprazole 40 mg once a day, levothyroxine 100 mcg once a day.   DISPOSITION: Discharge back to her group home. Follow up with [facility].   DIAGNOSIS, PRINCIPAL AND PRIMARY:  AXIS I: Schizoaffective disorder, depressed.   SECONDARY DIAGNOSES:  AXIS I: No further.  AXIS II: Rule out developmental disability.  AXIS III: Hypertension, diabetes, dyslipidemia, chronic pain, gastric reflux symptoms, hypothyroidism.  AXIS IV: Moderate to severe from complaining about her living situation.  AXIS V: Functioning at time of discharge 55.    ____________________________ Gonzella Lex, MD jtc:gb D: 09/06/2013 14:36:42 ET T: 09/06/2013 21:12:17 ET JOB#: [id number]  cc: Gonzella Lex, MD, <Dictator> Gonzella Lex MD ELECTRONICALLY SIGNED 09/10/2013 17:10

## 2014-07-12 NOTE — H&P (Signed)
[name], YEARWOOD MR#:  [id number] DATE OF BIRTH:  01/08/1961  DATE OF ADMISSION:  09/04/2013  IDENTIFYING INFORMATION AND CHIEF COMPLAINT: 54- year-old woman came to the Emergency Room stating that she was having thoughts about hurting someone else or hurting herself. Chief complaint, "I'm not feeling good."   HISTORY OF PRESENT ILLNESS: Information obtained from the patient and the chart. The patient reports for the last 2 or 3 weeks, her mood has been bad. She feels depressed all the time. She says she has been having thoughts about hurting herself or hurting other people. She is a little vague about this, but repeats it several times. She has not been sleeping well, getting only a couple of hours of sleep at night. Her appetite has been poor. She says that sometimes she feels like she hears voices, but she is not entirely sure about it. She has been compliant with all of her medications. She has complaints about her group home, but those are chronic. It is not clear what, if any, things might have happened to set this current episode off.   PAST PSYCHIATRIC HISTORY: The patient has had a diagnosis of schizophrenia in the past and had a couple of admissions here to the hospital a few years ago. Since that time, apparently, she has stayed out of the hospital. She is treated by a psychiatrist at [facility] and is currently taking Abilify. There have not been any recent changes to that. She also gets a Prolixin Decanoate shot every two weeks. She does have a past history of suicide attempts and no clear history of violence.   FAMILY HISTORY: None identified.   SOCIAL HISTORY: Lives in [facility]. Has chronic dissatisfaction with it. That is nothing new.   PAST MEDICAL HISTORY: The patient has dyslipidemia, high blood pressure, chronic constipation, hypothyroidism, some chronic pain, gastric reflux symptoms, and diabetes. Has been compliant with medicine.   SUBSTANCE ABUSE  HISTORY: Used to have a problem with drinking in the past and also used to have a heavy problem with using tobacco in the past, but says that she stopped both of those and has not done it in a long time.   CURRENT MEDICATIONS: Sertraline 100 mg per day, senna 1 tablet at night, MiraLax 17 grams per day, pioglitazone 45 mg per day, omeprazole 20 mg twice a day, metformin 850 mg twice a day, meloxicam 7.5 mg daily, Ativan 0.5 mg b.i.d., lithium carbonate 450 mg b.i.d., Synthroid 0.1 mg daily, Lasix 20 mg per day, docusate 100 mg twice a day, carvedilol 3.125 mg twice a day, Cogentin 0.5 mg twice a day, Lotensin 10 mg a day, atorvastatin 20 mg per day, aspirin 81 mg a day, aripiprazole 30 mg per day, and a Prolixin Decanoate shot 25 mg every two weeks with the last dose having been given on 11th of June.   ALLERGIES: BEE STINGS.   REVIEW OF SYSTEMS: Depressed mood, thoughts about hurting others and hurting herself, feeling anxious. Poor sleep, poor appetite. No other specific physical symptoms.   MENTAL STATUS EXAM: Somewhat disheveled woman, looks depressed and down. Cooperative with the interview. Eye contact intermittent. Psychomotor activity sluggish. Speech decreased in total amount and quiet. Affect flat and mildly dysphoric.  Mood stated as depressed. Thoughts appear slow and a little disorganized, and making obviously bizarre statements. Endorses some hallucinations that she has trouble describing. Endorses suicidal and homicidal ideation. Short-term memory impaired, only one out of three objects. Longer-term memory  a bit impaired as well. Alert and oriented x 4. Decreased normal fund of knowledge.   PHYSICAL EXAMINATION:  GENERAL: Older woman, but looks older than her stated age.  SKIN: No acute skin lesions.  HEENT: Pupils equal and reactive. Face symmetric. Oral mucosa dry.  NECK AND BACK: Nontender.  NEUROLOGIC: Full range of motion at all extremities when she tries, but she walks with a  staggering gait. Strength and reflexes symmetric and normal to testing. Cranial nerves symmetric and normal.  LUNGS: Clear, no wheezes.  HEART: Regular rate and rhythm.  ABDOMEN: Soft, nontender, normal bowel sounds.  VITAL SIGNS: Include a pulse of 87, respirations 18, blood pressure 92/59, temperature  98.2.   LABORATORY RESULTS: Chemistry shows glucose slightly elevated at 135, BUN elevated at 20. ALT and AST both low at 11, alcohol level negative. Salicylates and acetaminophen negative. TSH 1.9, which is normal. CBC shows a low hemoglobin at 10.9, otherwise, unremarkable.   ASSESSMENT: This is a 54 year old woman with a history of schizophrenia or schizoaffective disorder who has been depressed for the last couple of weeks. It does not seem like there have been any changes to her medicine. It is not clear to me why she would be getting more depressed. No new stresses identified. No substance abuse identified. No medicine noncompliance. Needs hospitalization for suicidal and homicidal ideation.   TREATMENT PLAN: Admit to psychiatry on current suicide precautions. Continue current medicines. I am also going to add Seroquel at night both for the sleep and to see if that helps more with her mood. Engage her in groups and activities on the unit. Daily supportive and educational counseling, Work with her group home about return home.   DIAGNOSIS, PRINCIPAL AND PRIMARY: AXIS I: Schizoaffective disorder, depressed.   SECONDARY DIAGNOSES:  AXIS I: No further.  AXIS II: No diagnosis. AXIS III: Hypertension, diabetes, hypothyroidism, chronic pain, gastric reflux symptoms, and dyslipidemia.  AXIS IV: Moderate and chronic from being in a group home.  AXIS V: Functioning at time of evaluation is 35.     ____________________________ Gonzella Lex, MD jtc:ts D: 09/04/2013 16:56:55 ET T: 09/04/2013 17:13:50 ET JOB#: [id number]  cc: Gonzella Lex, MD, <Dictator> Gonzella Lex MD ELECTRONICALLY  SIGNED 09/10/2013 17:10

## 2014-07-12 NOTE — Consult Note (Signed)
[name], Elizabeth Gutierrez MR#:  [id number] DATE OF BIRTH:  01-23-1961  DATE OF CONSULTATION:  12/06/2013  REFERRING PHYSICIAN:  Hillary Bow, MD CONSULTING PHYSICIAN:  Isaias Cowman, MD  PRIMARY CARE PHYSICIAN: Denton Lank, MD  CHIEF COMPLAINT: Chest pain.  HISTORY OF PRESENT ILLNESS: The patient is a 54 year old female with history of schizophrenia, coronary artery disease, hypertension and hyperlipidemia. The patient apparently had a non-ST elevation myocardial infarction in 2008 at which time she refused cardiac catheterization. The patient presented to [facility] Emergency Room with left-sided chest pain. She describes chest discomfort unrelated to exertion which is positional in nature when she reaches from her left side to her right side. The patient presented to [facility] Emergency Room where chest CT was performed, which revealed coronary artery calcifications, and the patient was admitted to telemetry. The patient currently denies chest pain. Troponin has remained negative.   PAST MEDICAL HISTORY: 1.  Non-ST elevation myocardial infarction in 2008. 2.  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  3.  Hypertension.  4.  Hyperlipidemia.  5.  Type 2 diabetes.  6.  Schizophrenia.  7.  Hypothyroidism.   MEDICATIONS ON ADMISSION: Aspirin 81 mg daily, Lipitor 20 mg daily, carvedilol 3.125 mg b.i.d., furosemide 20 mg daily, Abilify 30 mg daily, Actos 40 mg daily, Ambien 10 mg at bedtime, Ativan 0.5 mg b.i.d., benztropine 0.5 mg b.i.d., Colace 100 mg b.i.d., fluphenazine injection intramuscular every 2 weeks 1 mL, lithium 450 mg b.i.d., meloxicam 7.5 mg daily, metformin 500 mg daily, MiraLax 17 grams on Monday, Wednesday and Friday, Prilosec 20 mg daily, Senokot 1 daily, sertraline 100 mg daily, Synthroid 50 mcg daily.  SOCIAL HISTORY: The patient currently lives in a group home. She denies tobacco abuse.   FAMILY HISTORY: No immediate family history of coronary artery disease or  myocardial infarction.   REVIEW OF SYSTEMS: CONSTITUTIONAL: No fever or chills.  EYES: No blurry vision.  EARS: No hearing loss.  RESPIRATORY: No shortness of breath.  CARDIOVASCULAR: Positional chest pain, as described above.  GASTROINTESTINAL: No nausea, vomiting, or diarrhea.  GENITOURINARY: No dysuria or hematuria.  ENDOCRINE: No polyuria or polydipsia.  MUSCULOSKELETAL: No arthralgias or myalgias.  NEUROLOGICAL: No focal muscle weakness or numbness.  PSYCHOLOGICAL: No depression or anxiety.   PHYSICAL EXAMINATION: VITAL SIGNS: Blood pressure 100/62, pulse 64, respirations 18, temperature 98.1, pulse ox 95%.  HEENT: Pupils equal and reactive to light and accommodation.  NECK: Supple without thyromegaly.  LUNGS: Clear.  HEART: Normal JVP. Normal PMI. Regular rate and rhythm. Normal S1, S2. No appreciable gallop, murmur, or rub.  ABDOMEN: Soft and nontender. Pulses were intact bilaterally.  MUSCULOSKELETAL: Normal muscle tone.  NEUROLOGIC: The patient is alert and oriented x3. Motor and sensory are both grossly intact.   IMPRESSION: A 54 year old female with known history of coronary artery disease with prior myocardial infarction at which time she refused cardiac catheterization. The patient presents with chest pain which sounds very atypical and likely to be noncardiac with negative troponin.   RECOMMENDATIONS: 1.  Continue present medications.  2.  Would defer full dose anticoagulation.  3.  Who would defer cardiac catheterization at this time. The patient just ate a full breakfast.  4.  May consider a functional study, which could be done as outpatient.  ____________________________ Isaias Cowman, MD ap:sb D: 12/06/2013 08:20:47 ET T: 12/06/2013 08:33:29 ET JOB#: [id number]  cc: Isaias Cowman, MD, <Dictator> Isaias Cowman MD ELECTRONICALLY SIGNED 12/17/2013 12:15

## 2014-07-12 NOTE — Discharge Summary (Signed)
[name], Elizabeth Gutierrez MR#:  [id number] DATE OF BIRTH:  1960/12/22  DATE OF ADMISSION:  12/05/2013 DATE OF DISCHARGE:  12/06/2013  ADMITTING PHYSICIAN: Srikar R. Sudini, MD.   DISCHARGING PHYSICIAN: Gladstone Lighter, MD.   PRIMARY CARE PHYSICIAN: Dr. Baltazar Apo.   CONSULTATIONS IN HOSPITAL: Dr. Saralyn Pilar cardiology consultation.   DISCHARGE DIAGNOSES:  1.  Chest pain secondary to costochondritis.  2.  History of coronary artery disease with history of NSTEMI in the past.   3.  Hypertension.  4.  Diabetes mellitus.  5.  Hyperlipidemia.  6.  Schizophrenia and bipolar disorder with depression.  7.  Hypothyroidism.   DISCHARGE HOME MEDICATIONS:  1.  Metformin 500 mg p.o. daily.  2.  Prilosec 20 mg 2 capsules p.o. daily.  3.  Meloxicam 7.5 mg p.o. daily.  4.  Aspirin 81 mg p.o. daily.  5.  MiraLax p.r.n. for constipation Monday, Wednesday, Friday.  6.  Actos 45 mg p.o. daily.  7.  Lasix 20 mg p.o. daily.  8.  Ambien 10 mg at bedtime for sleep.  9.  Sertraline 100 mg p.o. daily.  10.   Synthroid 50 mcg p.o. daily.  11.   Colace 100 mg p.o. b.i.d.  12.   Benztropine 0.5 mg p.o. b.i.d.  13.   Lithium 450 mg p.o. b.i.d.  14.   Coreg 3.125 mg p.o. b.i.d.  15.   Fluphenazine decanoate 25 mg per mL injectable solution via intramuscular every 2 weeks.  16.   Senokot 1 tablet p.o. at bedtime.  17.   Lipitor 20 mg p.o. daily.  18.   Ativan 0.5 mg 2 tablets at bedtime.  19.   Abilify 30 mg p.o. daily.  20.   Atrovent 0.5 mg once a day p.r.n.  21.   Ibuprofen 400 mg q. 8 hours p.r.n. costochondritis pain.   DISCHARGE DIET: Low-sodium and ADA 1800 calorie diet.   DISCHARGE ACTIVITY: As tolerated.    FOLLOWUP INSTRUCTIONS: 1.  Outpatient follow up with Dr. Saralyn Pilar in 1 week for outpatient stress test.  2.  PCP followup in 1 to 2 weeks.   LABORATORIES AND IMAGING STUDIES PRIOR TO DISCHARGE: WBC 8.0, hemoglobin 11.1, hematocrit 35.2, platelet count 399,000.   Sodium 138,  potassium 4.4, chloride 107, bicarbonate 26, BUN 20, creatinine 1.1, glucose 122, and calcium of 9.7.   ALT 13, AST 20, alkaline phosphatase 101, total bilirubin 0.2, albumin of 3.2. Troponin is negative.   CT chest with contrast showing no evidence of PE. Coronary atherosclerosis with calcified plaque in the distribution of LAD and hepatic steatosis, cholelithiasis may be noted.   BRIEF HOSPITAL COURSE: Elizabeth Gutierrez is a 54 year old female with history of schizophrenia, diabetes mellitus, hypertension, bipolar disorder with depression, decreased cognition, and history of non-STEMI, at which time she has refused cardiac catheterization in 2008. Admitted to the hospital secondary to chest pain.  1.  Chest pain, likely costochondritis. The patient was lifting boxes 2 days ago and developed chest pain. It is tender to touch and hurts with position change. Seen by cardiologist. Dion Saucier out for MI. Dr. Saralyn Pilar recommended outpatient cath. She does have coronary atherosclerosis and history of non-STEMI. The CT chest does not indicate that she is having an acute infarction at this time. Her chest pain is clearly atypical and musculoskeletal in nature. She will be discharged on Motrin p.r.n.  2.  Diabetes mellitus. The patient is on metformin and Actos.  3.  Hypertension. Continue her home medications. She has  been stable.  4.  Schizophrenia. Continue on her psych medications. The patient is from [facility] group home.   Her mother was updated of the discharge plan. The patient can have outpatient stress and follow up with Dr. Saralyn Pilar.   DISCHARGE CONDITION: Stable.   DISCHARGE DISPOSITION: To group home.   TIME SPENT ON DISCHARGE: 40 minutes.    ____________________________ Gladstone Lighter, MD rk:at D: 12/06/2013 13:26:11 ET T: 12/06/2013 14:12:38 ET JOB#: [id number]  cc: Gladstone Lighter, MD, <Dictator> Sarah "Sallie" Posey Pronto, MD Isaias Cowman, MD Gladstone Lighter MD ELECTRONICALLY  SIGNED 12/19/2013 9:50

## 2014-07-13 NOTE — Discharge Summary (Signed)
[name], Elizabeth Gutierrez MR#:  [id number] DATE OF BIRTH:  [birth year]  DATE OF ADMISSION:  04/06/2011 DATE OF DISCHARGE:  04/07/2011   ADDENDUM:   ADMITTING DIAGNOSIS: Chest pain.   DISCHARGE DIAGNOSES: 1. Chest pain of unclear etiology, likely gastrointestinal.  2. Status post Myoview. No ischemia noted.  3. Gallstones. 4. Severe constipation.  5. Hypertension.  6. Hyperlipidemia.  7. Diabetes mellitus, type II. Hemoglobin A1c 7.0.   8. Schizoaffective disorder. 9. History of hypothyroidism.   DISCHARGE CONDITION: Stable.   DISCHARGE MEDICATION LIST: The same as the patient's medication list.   ADDITIONAL MEDICATIONS ADDED:  1. Colace 100 mg p.o. twice daily.  2. Senna 1 tablet p.o. once daily. 3. MiraLAX 17 grams p.o. daily as needed. 4. Omeprazole 40 mg p.o. daily.   DIET: 1800 ADA, low sodium, low fat, high fiber diet.  FOLLOW-UP: Follow-up appointment with Dr. Baltazar Apo in two days after discharge.   The patient's chest pain was evaluated with Myoview and no ischemia was noted. It was unclear why the patient had chest pain, however, it looks very likely it was noncardiac in origin and suggested gastrointestinal. For this reason, omeprazole was started for this patient who is taking nonsteroidal anti-inflammatory medications. It is suggested for the patient to see gastroenterologist or even surgeon to evaluate her for gallstone disease if she experiences more discomfort in her chest or her abdomen. Because she was noted to have significant constipation on her x-rays, also medications to help her with stool movement were prescribed such as Colace, Senna, as well as MiraLAX. The patient had a bowel movement on day of discharge.   On day of discharge, the patient's vital signs are stable with temperature of 97.5, pulse 77, respiration rate 20, blood pressure 101/75, saturation 97 to 98% on room air at rest.   TIME SPENT: 40 minutes.  ____________________________ Theodoro Grist, MD rv:drc D: 04/07/2011 19:14:25 ET T: 04/08/2011 09:37:35 ET JOB#: [id number]  cc: Theodoro Grist, MD, <Dictator> Sarah "Sallie" Posey Pronto, MD Theodoro Grist MD ELECTRONICALLY SIGNED 04/18/2011 7:49

## 2014-07-13 NOTE — Discharge Summary (Signed)
[name], Elizabeth Gutierrez MR#:  [id number] DATE OF BIRTH:  May 24, 1960  DATE OF ADMISSION:  04/06/2011 DATE OF DISCHARGE:  04/07/2011  ADMITTING DIAGNOSIS: Chest pain.   DISCHARGE DIAGNOSES:  1. Chest pain, status post Myoview; no ischemia noted per verbal report.  2. History of hypertension.  3. Hyperlipidemia.  4. Diabetes mellitus type 2, hemoglobin A1c 7.0.  5. Constipation, severe. 6. History of schizoaffective disorder. 7. Hypothyroidism, stable.   DISCHARGE CONDITION: Stable.   DISCHARGE MEDICATIONS: Patient is to resume her outpatient medications which are:  1. Actos 45 mg p.o. daily.  2. Furosemide 20 mg p.o. daily.  3. Abilify 20 mg p.o. daily.  4. Sertraline 100 mg p.o. daily.  5. Perphenazine 25 mg injectable solution intramuscular every two weeks.  6. Levothyroxine 100 mcg p.o. daily.  7. Benazepril 10 mg p.o. daily.  8. Aspirin 81 mg p.o. daily.  9. Tylenol 650 mg p.o. every four hours as needed.  10. Diabetic Tuss DM 10 mg/100 mg in 5 mL oral liquid 2 tsp orally every six hours as needed for cough.  11. TUMS 1 to 2 tablets every four hours as needed.  12. Meloxicam 7.5 mg p.o. daily.  13. Metformin 850 mg p.o. twice daily.  14. Lorazepam 0.5 mg twice daily.  15. Benztropine 0.5 mg twice daily.  16. Lithium 450 mg p.o. twice daily. 17. Carvedilol 3.125 mg p.o. twice daily.  18. Atorvastatin 20 mg p.o. daily at bedtime.  19. True Track Smart System use as directed. Check blood sugars at 8:00 a.m., 4:00 p.m. and as needed if patient has symptoms for low blood sugars.   ADDITIONAL MEDICATIONS:  1. Colace 100 mg p.o. twice daily.  2. Senna 1 tablet p.o. once daily. 3. MiraLax 17 grams p.o. daily as needed. 4. Omeprazole 40 mg p.o. daily.   HOME OXYGEN: None.   DIET: 1800 ADA, low sodium, low fat, high fiber diet.  ACTIVITY LIMITATIONS: As tolerated.   FOLLOW UP: Follow-up appointment with Dr. Denton Lank in two days after discharge.    CONSULTANT: Care  management.   LABORATORY, DIAGNOSTIC AND RADIOLOGICAL DATA: Chest x-ray, portable single view 04/06/2011 showed hyperinflation, no acute cardiopulmonary disease or significant interval change. Abdomen complete three or more views of abdomen 04/05/2011: Significant amount of residual fecal material, correlate for constipation, cholelithiasis.   Lab data revealed glucose 112, BUN 22, otherwise unremarkable BMP. Patient's liver enzymes were also unremarkable. Cardiac enzymes, first set, as well as subsequent two more sets were within normal limits. Lithium was slightly elevated at 1.35. Repeated study 04/07/2011 showed therapeutic lithium level at 0.95. White blood cell count was normal at 8.5, hemoglobin 10.6, platelet count 409. Patient's chest x-ray was unremarkable. Patient's EKG according to Dr. Serita Grit note showed normal sinus rhythm with left axis deviation, anterolateral infarct, no acute ST-T changes, however, were noted.   HISTORY OF PRESENT ILLNESS: Patient is 54 year old female with history of mental retardation, diabetes mellitus who presented to the hospital with complaints of substernal chest pains. Please refer to Dr. Serita Grit admission note on 04/06/2011. On arrival to the hospital it appeared that patient was complaining of 10 to 20 minute history of substernal chest pain. She reported pain being on right as well as on the left side of the chest. She denied any shortness of breath, however, it was difficult to say how reliable she was. On arrival to the Emergency Room, patient's temperature was 98.4, pulse 84, respiratory rate 20, blood pressure 145/68, saturation  96% on room air. Physical examination was unremarkable.   HOSPITAL COURSE: Patient was admitted to the hospital. Her cardiac enzymes were cycled and she underwent cardiac stress test today, on 04/07/2011. Verbal report of that stress test was unremarkable for inducible ischemia. It was felt that patient's chest pain possibly is  noncardiac even gastrointestinal possible. So for this reason patient was given omeprazole daily to be taken for possible gastrointestinal pain. Advised to have gastroenterology consultation versus consultation with surgeon for cholelithiasis evaluation, cholecystectomy as outpatient for the concern that patient's chest pain could have been related to gallblader dysmotility.   In regards to hypertension, hyperlipidemia, diabetes mellitus, schizoaffective disorder as well as hypothyroidism, there was no significant new recommendations given. Patient was noted to be constipated as mentioned above. Her x-ray done during this admission revealed significant stool impaction. Patient was given medications to improve her constipation and she in fact had bowel movement on the day of discharge, 04/07/2011.  (dictation cut off here)  ____________________________ Theodoro Grist, MD rv:cms D: 04/07/2011 19:09:26 ET T: 04/08/2011 09:19:32 ET JOB#: [id number]  cc: Theodoro Grist, MD, <Dictator> Sarah "Sallie" Posey Pronto, MD [place] MD ELECTRONICALLY SIGNED 05/01/2011 20:14

## 2014-07-13 NOTE — H&P (Signed)
[name], Elizabeth Gutierrez MR#:  [id number] DATE OF BIRTH:  03/08/1961  DATE OF ADMISSION:  04/06/2011  PRIMARY CARE PROVIDER: Denton Lank, MD at [facility] DOCTOR: Dr. Robet Leu   CHIEF COMPLAINT: Substernal chest pain.   HISTORY OF PRESENT ILLNESS: The patient is a 54 year old white female with history of hypertension, diabetes, hyperlipidemia, schizoaffective disorder, history of ischemic cardiomyopathy per her records, however, no details of this are available, who presents with complaint of substernal chest pain that lasted about 10 to 20 minutes today. The patient is a vague historian. She also reports that the pain was on the right side of the chest as well as left side of the chest. She did not have any associated shortness of breath. She was just resting when this pain happened. Now the pain is resolved. She denies any chest pain on exertion or dyspnea on exertion. The patient reports that she had a stress test a long time ago. She is not sure when that was. She, otherwise, denies any fevers or chills No cough. No radiation of the pain to neck, jaw, or any other place.   PAST MEDICAL HISTORY:  1. Type II diabetes.  2. Hypertension.  3. Hyperlipidemia.  4. Schizoaffective disorder.  5. Previous history of valproic acid related hyperammonia.  6. Hypothyroidism.   7. Ischemic cardiomyopathy.   ALLERGIES: Allergy to no medication but states allergy to bee stings.   CURRENT MEDICATIONS:  1. TUMS 1 to 2 tabs p.r.n.  2. Tylenol 650 2 tabs q.4 p.r.n.  3. Diabetic Tussin q.6 p.r.n.  4. Actos 45 1 tab p.o. daily.  5. Lasix 20 1 tab p.o. daily.  6. Abilify 20 daily.  7. Zoloft 100 daily.  8. Prolixin 25 mg sub-Q q.2 weeks.  9. Levoxyl 100 mcg daily.  10. Lotensin 10 p.o. daily.  11. Aspirin 81 one tab p.o. daily.  12. Meloxicam 7.5 1 tab p.o. daily.  13. Glucophage 850 1 tab p.o. b.i.d.  14. Ativan 0.5 one tab p.o. b.i.d.  15. Cogentin 0.5 one tab p.o.  b.i.d.  16. Lithium ER 400 one tab p.o. b.i.d.  17. Coreg 3.125 one tab p.o. b.i.d.  18. Lipitor 20 one tab p.o. daily.   SOCIAL HISTORY: Currently lives in a group home. She dips snuff. No alcohol. No drug abuse.   FAMILY HISTORY: Significant for diabetes and stomach cancer in grandmother.  REVIEW OF SYSTEMS: CONSTITUTIONAL: Denies any fevers, fatigue, weakness. Complains of chest pain. No weight loss. No weight gain. EYES: No blurred or double vision. No pain. No redness. No inflammation. No glaucoma. No cataracts. ENT: No tinnitus. No ear pain. No hearing loss. No seasonal or year-round allergies. No nasal discharge. No redness of the oropharynx. RESPIRATORY: No cough. No wheezing. No hemoptysis. No dyspnea. CARDIOVASCULAR: Complains of chest pain as above. No orthopnea. Complains of occasional edema. No arrhythmia. No palpitations. No syncope. GI: No nausea, vomiting, or diarrhea. Complains of abdominal distention. No hematemesis. No melena. Denies any changes in bowel habits. GU: Denies any dysuria, hematuria, renal calculus, frequency, or incontinence. ENDOCRINE: Denies any polyuria, nocturia, or thyroid problems. Denies any increased sweating, heat or cold intolerance, or thirst. HEME/LYMPH: Denies any anemia, easy bruisability, bleeding, or swollen glands. SKIN: No acne. No rash. No changes in mole, hair or skin. MUSCULOSKELETAL: Denies any pain in the neck, back, or shoulder. NEUROLOGIC: No numbness. No cerebrovascular accident. No transient ischemic attack. PSYCHIATRIC: Has schizoaffective disorder.  PHYSICAL EXAMINATION:   VITAL SIGNS: Temperature 98.4, pulse 84, respirations 20, blood pressure 145/68, O2 96% on room air.   GENERAL: The patient is a well developed, well nourished female in no acute distress.   HEENT: Pupils are equal, round, reactive to light and accommodation. Sclerae no conjunctival pallor. No icterus. Extraocular movements intact. Oropharynx is clear without any  exudate.   NECK: There is no thyromegaly. No carotid bruits.   CARDIOVASCULAR: Regular rate and rhythm. No murmurs, rubs, clicks, gallops. PMI not displaced.   LUNGS: Clear to auscultation bilaterally without any rales, rhonchi, or wheezing.   ABDOMEN: Soft, nontender, nondistended. Positive bowel sounds x4.   EXTREMITIES: No clubbing, cyanosis, or edema.   NEUROLOGICAL: Awake, alert, oriented x3. No focal deficits.   SKIN: There is no rash.   LYMPHATICS: No lymph nodes palpable.   MUSCULOSKELETAL: There is no erythema or swelling.   VASCULAR: Good DP, PT pulses.   SKIN: There is no rash.   NEUROLOGIC: Awake, alert, oriented x3. No focal deficits.  LABORATORY, DIAGNOSTIC, AND RADIOLOGICAL DATA: Glucose 112 BUN 22, creatinine 1.20, sodium 143, potassium 4.4, chloride 106, CO2 26. LFTs showed albumin of 3.2, AST 12. CK-MB less than 0.5. Troponin less than 0.02. WBC 8.5, hemoglobin 10.6, platelet count 409. EKG normal sinus rhythm with left axis deviation, anterolateral infarct.   ASSESSMENT AND PLAN: The patient is a 54 year old white female with a history of diabetes, ischemic cardiomyopathy, hypertension, hyperlipidemia, and schizoaffective disorder who presents to the ED with chest pain.  1. Chest pain, sounds atypical. EKG and cardiac enzymes currently negative. At this time I will place her on observation. Place her on aspirin. Check serial enzymes. Will do a stress MIBI in the a.m.  2. Abdominal distention possibly related to gas, constipation. I'll check an abdominal x-ray.  3. Diabetes, type II. Will continue her home medications with Actos and metformin.  4. Hypothyroidism. Continue Levoxyl as taking at home.  5. Schizoaffective disorder. Continue Abilify, Zoloft, and Ativan as taking at home.   TIME SPENT: 35 minutes.   ____________________________ Lafonda Mosses Posey Pronto, MD shp:drc D: 04/06/2011 16:42:51 ET T: 04/06/2011 17:21:00 ET JOB#: [id number]  cc: Madasyn Heath H. Posey Pronto,  MD, <Dictator> Sarah "Sallie" Posey Pronto, MD Alric Seton MD ELECTRONICALLY SIGNED 04/27/2011 8:14

## 2014-07-13 NOTE — Discharge Summary (Signed)
PATIENT NAME:  KAYLYNE, AXTON MR#:  [id number] DATE OF BIRTH:  1961-01-30  DATE OF ADMISSION:  04/06/2011 DATE OF DISCHARGE:  04/08/2011  ADDENDUM  Patient was not discharged on 04/07/2011 because of some transportation issues from group home. Today, 04/08/2011, she feels satisfactory, does not complain of any significant discomfort. She denies any chest pains or abdominal pains. Her vitals were stable. Her temperature is 98.2, pulse 77, respiration rate 20, blood pressure 114/70, saturation was 94% on room air at rest. She was informed about gallstones in her abdomen and refused to discuss surgical options at this point. She was, however, told that if she does have some pain she may need to consider surgery in the future.   ____________________________ Theodoro Grist, MD rv:cms D: 04/08/2011 19:29:46 ET T: 04/09/2011 11:52:56 ET JOB#: [id number]  cc: Theodoro Grist, MD, <Dictator> Sarah "Sallie" Posey Pronto, MD Theodoro Grist MD ELECTRONICALLY SIGNED 04/18/2011 7:49

## 2014-07-20 NOTE — Consult Note (Signed)
[name], Elizabeth Gutierrez MR#:  [id number] DATE OF BIRTH:  June 18, 1960  DATE OF CONSULTATION:  04/23/2014  REFERRING PHYSICIAN:   CONSULTING PHYSICIAN:  Gonzella Lex, MD  IDENTIFYING INFORMATION AND REASON FOR CONSULT: A 54 year old woman with a history of schizoaffective disorder, who presents voluntarily to the hospital.   CHIEF COMPLAINT: "I'm having bad thoughts."   HISTORY OF PRESENT ILLNESS: Information obtained from the patient and the chart. The patient states for the last 2 weeks, she has been hallucinating and having bad thoughts. Says she hears voices all the time. They talk about her and accuse her of bad things. She is having feelings like people at the group home are blaming her for things and are out to get her. She is sleeping poorly, less than usual. No complaints about her appetite. Mood is feeling nervous and depressed. Says she has had some thoughts about killing herself with no specific plan. No homicidal ideation. She claims to be fully compliant with all of her prescribed medications and does not think there has been any change to any of her medicines recently. She cannot cite any change in her life circumstances or recent trauma or stressful event that could have made her feel worse. Denies that she has been drinking or abusing drugs.   PAST PSYCHIATRIC HISTORY: Long history of schizoaffective disorder. Several prior hospitalizations, last here in the psychiatric ward, June 2015. She was on essentially the same medicine at that time that she is taking now. The patient tends to respond fairly well to treatment in the hospital. She has a distant past history of suicide attempts. No history of violence. Has been maintained on 2 antipsychotics and lithium for years, it looks like.   PAST MEDICAL HISTORY: The patient has diabetes on oral medication. Also has high blood pressure, hypothyroidism, dyslipidemia, some chronic pain.   SOCIAL HISTORY: Her mother is living and she  stays in contact with her. Otherwise, does not have much contact with other relatives. Lives at a group home and has been there for a couple of years. Does not have any specific complaint about it.   SUBSTANCE ABUSE HISTORY: No history of alcohol or drug problems.   FAMILY HISTORY: She does not know of any family history of mental health problems.  MEDICATIONS ON ADMISSION: Aspirin 81 mg per day, meloxicam 7.5 mg per day, Paxil 40 mg per day, carvedilol 3.125 mg twice a day, Cogentin 0.5 mg twice a day, lithium carbonate extended release 450 mg twice a day. Prolixin Decanoate 25 mg every 2 weeks, last dose given January 27. Actos 45 mg once a day. Ativan 1 mg at night and 0.5 mg in the morning. Lipitor 25 mg at bedtime, metformin 1000 mg extended release per day, Synthroid 75 mcg per day, Abilify 30 mg per day, Zoloft 100 mg per day, Lasix 20 mg per day, MiraLax 17.5 grams 3 times a week.   ALLERGIES: BEE STINGS.    REVIEW OF SYSTEMS: Auditory hallucinations, "bad thoughts." Vague suicidal ideation. Poor sleep. No specific physical complaints.   MENTAL STATUS EXAMINATION: Disheveled woman, looks her stated age or older, cooperative with the interview. Eye contact good. Psychomotor activity a little fidgety. Speech is slow, decreased in total amount, but is able to hold her normalish conversation. Her affect is a little constricted. Mood stated as being bad. Thoughts are scattered and somewhat simple and repetitive. Did not make any obviously delusional statements, but claims to have some paranoid feelings. States she  has auditory hallucinations. No visual hallucinations. Endorses suicidal ideation without specific plan. No homicidal ideation. She is alert and oriented x 4. Remembers 3/3 objects immediately, cannot remember any of them at 3 minutes, but does not seem to be making much of an effort. Judgment and insight always have some chronic impairment. Intelligence is probably low average.    LABORATORY RESULTS: Urinalysis is unremarkable. Drug screen is all negative. TSH normal. CBC: Slightly low hemoglobin, otherwise normal. Alcohol level negative. Chemistry panel: Elevated alkaline phosphatase 125, low ALT at 13.   VITAL SIGNS: Blood pressure 112/57, respirations 18, pulse 69, temperature 98.7.   No lithium level seems to have been done.   ASSESSMENT: This is a 54 year old woman with a history of schizoaffective disorder, who presents to the hospital voluntarily, but saying she is having paranoia, hallucinations and suicidal ideation.   I forgot to mention on her medicines previously that she gets a Prolixin Decanoate shot regularly, and it looks like the last one was given on January 27.   She does not have any obvious reason for decompensation, but he is having psychosis and suicidal thoughts requiring hospitalization to stabilize her.   TREATMENT PLAN: Admit to psychiatry. Suicide (Dictation Anomaly) << MISSING TEXT>> precautions in place. Continue current medications. I am also going to go ahead and give her another Prolixin Decanoate shot since it has been about a week since her last one and she is still having psychotic symptoms. Primary treatment team can work on further evaluation and treatment planning. Psychoeducation completed.   DIAGNOSIS, PRINCIPAL AND PRIMARY:  AXIS I: Schizoaffective disorder, depressed.   SECONDARY DIAGNOSES:  AXIS I: Deferred.  AXIS II: Deferred.  AXIS III: Hypothyroid, hypertension, diabetes, dyslipidemia.   ____________________________ Gonzella Lex, MD jtc:JT D: 04/23/2014 14:29:34 ET T: 04/23/2014 14:42:37 ET JOB#: [id number]  cc: Gonzella Lex, MD, <Dictator> Gonzella Lex MD ELECTRONICALLY SIGNED 04/30/2014 17:26

## 2014-07-29 NOTE — H&P (Signed)
[name], Elizabeth Gutierrez [id number] OF BIRTH:  1960/07/21 OF ADMISSION:  02/03/2016OF ASSESSMENT: 04/24/2104 INFORMATION: A 54 year old woman with a history of schizoaffective disorder, who presents voluntarily to the hospital. Pt has a legal guardian. COMPLAINT: "I'm having bad thoughts."  OF PRESENT ILLNESS:  Per information obtained in the ER on 04/23/14 : Pt. presented to ER from [facility] [phone]), which is managed by [name]. According to the pt. she has been having thoughts of hurting herself and others and having hallucinations telling to kill herself and others. She reported asking staff from [facility] to bring her to the ER in order to get help. She admitted she has been verbally aggressive towards staff and other residents. She says she has been telling them to "Shut the hell and calling them stupid little bitches..." She states she don't want to have these behaviors but she is easily agitated.  to the [facility] [place]) the pt. behaviors have been getting worse the last three weeks. She has been easily agitated and increase paranoia. She has been Museum/gallery curator and residents of talking about her. The also reports the pt. has started hitting himself in the head. Staff states she started working there August 2015 and never seen the pt. hit herself. She has witness her responding to internal stimuli but its been one to two times every other week. Now it's an ongoing thing. the ER her lithium level was 1.23.  patient appeared somewhat confused.  She required to have questions repeated multiple tims as she was very distractible.   she denied having SI, HI or A/VH.  She c/o poor sleep, appetite, energy and concentration.  States she wants to be discharge to her own apartment and does not want to return to the [facility].  use: negcigarettes but unable to quantify PSYCHIATRIC HISTORY: Long history of schizoaffective disorder. Several prior hospitalizations, last here in the psychiatric ward,  June 2015. She was on essentially the same medicine at that time that she is taking now. The patient tends to respond fairly well to treatment in the hospital. She has a distant past history of suicide attempts. No history of violence. Has been maintained on 2 antipsychotics and lithium for years, it looks like.  MEDICAL HISTORY: The patient has diabetes on oral medication. Also has high blood pressure, hypothyroidism, dyslipidemia, some chronic pain.  HISTORY: Her mother is living and she stays in contact with her. Otherwise, does not have much contact with other relatives. Lives at a group home and has been there for a couple of years. Does not have any specific complaint about it.  HISTORY: She does not know of any family history of mental health problems. ON ADMISSION: Aspirin 81 mg per day, meloxicam 7.5 mg per day, Paxil 40 mg per day, carvedilol 3.125 mg twice a day, Cogentin 0.5 mg twice a day, lithium carbonate extended release 450 mg twice a day. Prolixin Decanoate 25 mg every 2 weeks, last dose given January 27. Actos 45 mg once a day. Ativan 1 mg at night and 0.5 mg in the morning. Lipitor 25 mg at bedtime, metformin 1000 mg extended release per day, Synthroid 75 mcg per day, Abilify 30 mg per day, Zoloft 100 mg per day, Lasix 20 mg per day, MiraLax 17.5 grams 3 times a week.  Prolixin Decanoate shot regularly, and it looks like the last one was given on January 27.    ALLERGIES: BEE STINGS.    MENTAL STATUS EXAMINATION: white female who  appears older than stated age.  Poor dental hygiene and multiple dental pieces missing.pleasant and cooperativeactivity: mildly decreasedcontact: wnlslurred.  process: concretecontent: neg for SI, HI, or A/V H. euthymiccongruent, fairfairexam:and oriented times 4of knowledge is averageand concentration: grossly intact.   OF SYSTEMS:no weight loss, fever, chills, weakness or fatigue.no visual loos, blurred vision, double vision or yellow sclerae.  Ears, nose  and throat: no hearing loos, sneezing, congestion, runny nose or sore throat.no rash or itchingno chest pain, chest pressure or discomfort.  No palpitations or edema.no shortness of breath, cough or sputum.no anorexia, nausea, vomiting or diarrhea. No abdominal pain or blood.no burning on urination.no muscle, back or joint pain/stiffness.no anemia, bleeding or bruising.no enlarged nodes. No h/o splenectomy.see history of present illness.no headache, dizziness, syncope, paralysis, ataxia, numbness or tingling in extremities.  No change in bowel or bladder control.no reports of sweating, cold or heat intolerance.  No polyuria or poly- dipsia.no history of asthma, hives, eczema or rhinitis.  PHYSICAL EXAMINATION: APPEARANCE: The patient is alert, oriented.  Patient is in no acute distress. normal gait, nl muscular tone, no involuntary movements.  Nursing and Ancillary Notes:  Nursing and Ancillary Notes: **Vital Signs.:   04-Feb-16 06:54   Vital Signs Type: Routine   Temperature Temperature (F): 98.6   Celsius: 37   Pulse Pulse: 70   Respirations Respirations: 18   Systolic BP Systolic BP: 500   Diastolic BP (mmHg) Diastolic BP (mmHg): 69   Pulse Pulse Sitting: 70    Labs:  Lab Results: Thyroid:  03-Feb-16 10:09   Thyroid Stimulating Hormone 2.45 (0.45-4.50= International Unit) -----------------------patients have reference for TSH: - - - - - - - - - - first trimetser: 0.36 - 2.50 uIU/mL)  Hepatic:  03-Feb-16 10:09   Bilirubin, Total 0.2  Alkaline Phosphatase  125  SGPT (ALT)  13  SGOT (AST) 15  Total Protein, Serum 7.2  Albumin, Serum  3.1  TDMs:  03-Feb-16 10:09   Lithium, Serum  1.23 (0.60-1.20>= 1.50 mmol/L)  General Ref:  03-Feb-16 10:09   Acetaminophen, Serum < 2 (10-30TOXIC: > 200 mcg/mL > 50 mcg/mL at 12 hr after ingestion > 300 mcg/mL at 4 hr after ingestion)  Salicylates, Serum < 1.7 (0.0-2.82.8-20.0 mg/dL>30.0 mg/dL)  Routine Chem:  03-Feb-16 10:09    Glucose, Serum  161  BUN 14  Creatinine (comp) 1.08  Sodium, Serum 139  Potassium, Serum 4.6  Chloride, Serum 107  CO2, Serum 27  Calcium (Total), Serum 9.6  Osmolality (calc) 281  eGFR (African American) >60  eGFR (Non-African American)  56 (eGFR values <70mL/min/1.73 m2 may be an indication of chronicdisease (CKD).eGFR, using the MRDR Study equation, is useful in with stable renal function.eGFR calculation will not be reliable in acutely ill patientsserum creatinine is changing rapidly. It is not useful inon dialysis. The eGFR calculation may not be applicablepatients at the low and high extremes of body sizes, pregnantand vegetarians.)  Anion Gap  5  Ethanol, S. < 3 (Result(s) reported on 23 Apr 2014 at 10:58AM.)  Urine Drugs:  [id number] 91:69   Tricyclic Antidepressant, Ur Qual (comp) NEGATIVE (Result(s) reported on 23 Apr 2014 at 11:08AM.)  Amphetamines, Urine Qual. NEGATIVE  MDMA, Urine Qual. NEGATIVE  Cocaine Metabolite, Urine Qual. NEGATIVE  Opiate, Urine qual NEGATIVE  Phencyclidine, Urine Qual. NEGATIVE  Cannabinoid, Urine Qual. NEGATIVE  Barbiturates, Urine Qual. NEGATIVE  Benzodiazepine, Urine Qual. NEGATIVE (-----------------URINE DRUG SCREEN provides only a preliminary, unconfirmedtest result and should not be used for non-medical  Clinical consideration and professional  judgment should be to any positive drug screen result due to possiblesubstances.  A more specific alternate chemical methodbe used in order to obtain a confirmed analytical result.  Gasspectrometry (GC/MS) is the preferredmethod.)  Methadone, Urine Qual. NEGATIVE  Routine UA:  03-Feb-16 10:09   Color (UA) Colorless  Clarity (UA) Clear  Glucose (UA) Negative  Bilirubin (UA) Negative  Ketones (UA) Negative  Specific Gravity (UA) 1.003  Blood (UA) Negative  pH (UA) 7.0  Protein (UA) Negative  Nitrite (UA) Negative  Leukocyte Esterase (UA) Negative (Result(s) reported on 23 Apr 2014 at 11:09AM.)  RBC  (UA) NONE SEEN  WBC (UA) <1 /HPF  Bacteria (UA) NONE SEEN  Epithelial Cells (UA) <1 /HPF (Result(s) reported on 23 Apr 2014 at 11:09AM.)  Routine Hem:  03-Feb-16 10:09   WBC (CBC) 8.3  RBC (CBC) 4.11  Hemoglobin (CBC)  11.3  Hematocrit (CBC) 36.1  Platelet Count (CBC) 406 (Result(s) reported on 23 Apr 2014 at 10:34AM.)  MCV 88  MCH 27.5  MCHC  31.3  RDW 14.5    DIAGNOSISI: Schizoaffective disorder, depressed. III: Hypothyroid, hypertension, diabetes, dyslipidemia. This is a 55 year old woman with a history of schizoaffective disorder, who presents to the hospital voluntarily, but saying she is having paranoia, hallucinations and suicidal ideation.   Lithium level elevated.  At examination pt appears confused.  Among medications listed noted Mobic, aspirin and Lasix all medication which can increase lithium level.  Recent changes in behavior could have been cause by lithium toxicity. PLAN: continue abilify 30 mg po q day and prolixin decanoate 25 mg q 2 weeks, last dose given 2/3 in our ER.  Appears that pt last received prolixin dec on January 7. continue benztropine 0.5 mg po bid per med reconciliation pt is only on sertraline.  During ER assessment it was reported pt was also on paxil 40 mg. Will continue only sertraline 100 mg po q day  stabilization: now mild lithium toxicity---confusion but no other symptoms.  Will d/c lithium for now as her last level was 1.23.  Will recheck level again now. continue carvedilol 3.125 mg twice a day disease: will hold dose of ASA for now Lipitor 25 mg at bedtime continue actos 45 mg and metformin XR 1000 mg po q day continue Synthroid 75 mcg per day will change Ativan to klonopin with the plan of tapering off benzos.  Due to confusion will d/c Mobic as it can increase lithium level will d/c Lasix as it ca increase lithium level.  Constipation: continue MiraLax 17.5 grams 3 times a week.      Electronic Signatures: Hildred Priest  (MD) (Signed on 04-Feb-16 15:03)  Authored   Last Updated: 04-Feb-16 15:06 by Hildred Priest (MD)

## 2014-08-06 DIAGNOSIS — K2100 Gastro-esophageal reflux disease with esophagitis, without bleeding: Secondary | ICD-10-CM | POA: Insufficient documentation

## 2014-09-04 ENCOUNTER — Ambulatory Visit
Admission: RE | Admit: 2014-09-04 | Discharge: 2014-09-04 | Disposition: A | Discharge status: Home / Self Care | Payer: Medicare Other | Source: Ambulatory Visit | Attending: Family Medicine | Admitting: Family Medicine

## 2014-09-04 ENCOUNTER — Other Ambulatory Visit: Payer: Self-pay | Admitting: Family Medicine

## 2014-09-04 DIAGNOSIS — M79671 Pain in right foot: Secondary | ICD-10-CM

## 2014-09-04 DIAGNOSIS — M19071 Primary osteoarthritis, right ankle and foot: Secondary | ICD-10-CM | POA: Insufficient documentation

## 2014-09-11 ENCOUNTER — Ambulatory Visit (INDEPENDENT_AMBULATORY_CARE_PROVIDER_SITE_OTHER): Payer: Medicare Other | Admitting: Podiatry

## 2014-09-11 DIAGNOSIS — M79676 Pain in unspecified toe(s): Secondary | ICD-10-CM

## 2014-09-11 DIAGNOSIS — M79671 Pain in right foot: Secondary | ICD-10-CM

## 2014-09-11 DIAGNOSIS — B351 Tinea unguium: Secondary | ICD-10-CM | POA: Diagnosis not present

## 2014-09-11 NOTE — Progress Notes (Signed)
Patient ID: Elizabeth Gutierrez, female   DOB: [birth year], 54 y.o.   MRN: [id number]  Subjective: 54 y.o.-year-old female returns the office today for painful, elongated, thickened toenails which she is unable to trim herself. Denies any redness or drainage around the nails. She states that she has been having some pain to her right foot and ankle which has been ongoing for several months. She said that she has no pain at rest or with palpation to the area although she does have pain to her foot when walking. She denies any history of injury, and she denies any change or increase activities and come onto the symptoms. The majority the pain is localized to the top and outside aspect of her foot. Had X-ray recently for R ankle/midfoot symptoms. Denies any other acute changes since last appointment and no new complaints today. Denies any systemic complaints such as fevers, chills, nausea, vomiting.   Objective: AAO 3, NAD DP/PT pulses palpable, CRT less than 3 seconds Protective sensation decreased with Simms Weinstein monofilament Nails hypertrophic, dystrophic, elongated, brittle, discolored 10. There is tenderness overlying the nails 1-5 bilaterally. There is no surrounding erythema or drainage along the nail sites. No open lesions or pre-ulcerative lesions are identified. There is a decrease in medial arch height upon weightbearing bilaterally with hammertoe contractures present. There is currently no areas of tenderness overlying the foot/ankle bilaterally there is no pain or crepitation with ankle, subtalar, midtarsal, MTPJ range of motion bilaterally. There is mild edema to the right foot without any associated erythema or increase in warmth.  No other areas of tenderness bilateral lower extremities at this time No pain with calf compression, swelling, warmth, erythema.  Assessment: Patient presents with symptomatic onychomycosis; right foot pain likely biomechanical in nature/arthritis     Plan: -Treatment options including alternatives, risks, complications were discussed -Previous x-rays which were recently obtained at the hospital on 09/04/2014 were reviewed which revealed no acute abnormalities to the ankle and there is mild degenerative changes in the midfoot as well as a small calcaneal spur.  -At today's appointment she was scanned and fitted for diabetic shoes.  -Nail sharply debrided 10 without complication/bleeding.  -Discussed daily foot inspection. If there are any changes, to call the office immediately.  -Follow-up in 3 months or sooner if any problems are to arise. In the meantime, encouraged to call the office with any questions, concerns, changes symptoms. If the right foot/ankle pain is not resolved within the next 3-4 weeks, recommended her/caregiver to call for earlier follow-up.

## 2014-09-15 ENCOUNTER — Encounter: Payer: Self-pay | Admitting: Podiatry

## 2014-10-02 ENCOUNTER — Encounter: Payer: Self-pay | Admitting: Podiatry

## 2014-10-02 ENCOUNTER — Ambulatory Visit (INDEPENDENT_AMBULATORY_CARE_PROVIDER_SITE_OTHER): Payer: Medicare Other | Admitting: Podiatry

## 2014-10-02 DIAGNOSIS — M2042 Other hammer toe(s) (acquired), left foot: Secondary | ICD-10-CM

## 2014-10-02 DIAGNOSIS — Q6650 Congenital pes planus, unspecified foot: Secondary | ICD-10-CM

## 2014-10-02 DIAGNOSIS — M2142 Flat foot [pes planus] (acquired), left foot: Secondary | ICD-10-CM

## 2014-10-02 DIAGNOSIS — L84 Corns and callosities: Secondary | ICD-10-CM | POA: Diagnosis not present

## 2014-10-02 DIAGNOSIS — M2041 Other hammer toe(s) (acquired), right foot: Secondary | ICD-10-CM

## 2014-10-02 DIAGNOSIS — E1149 Type 2 diabetes mellitus with other diabetic neurological complication: Secondary | ICD-10-CM

## 2014-10-02 DIAGNOSIS — M19079 Primary osteoarthritis, unspecified ankle and foot: Secondary | ICD-10-CM

## 2014-10-02 DIAGNOSIS — E1140 Type 2 diabetes mellitus with diabetic neuropathy, unspecified: Secondary | ICD-10-CM | POA: Diagnosis not present

## 2014-10-02 DIAGNOSIS — M2141 Flat foot [pes planus] (acquired), right foot: Secondary | ICD-10-CM

## 2014-10-02 NOTE — Patient Instructions (Signed)

## 2014-10-05 NOTE — Progress Notes (Signed)
Patient ID: Elizabeth Gutierrez, female   DOB: October 27, 1960, 54 y.o.   MRN: [id number]  Patient presents today to pick of diabetic shoes. The patient try the shoes on and he appeared to be fitting well the any high-pressure areas and are comfortable. Written and oral break-in instructions were discussed with the patient. Also discussed with her that if she was noticed any increase in redness or any high-pressure areas of stop wearing the shoes and call the office. She understood this. She denies any acute changes since last appointment. No other complaints at this time. She'll follow-up as scheduled for routine care or sooner if any palms are to arise. In the meantime I encouraged her to call the office with any questions, concerns, change in symptoms.

## 2014-11-24 ENCOUNTER — Emergency Department
Admission: EM | Admit: 2014-11-24 | Discharge: 2014-11-24 | Disposition: A | Discharge status: Home / Self Care | Payer: Medicare Other | Attending: Emergency Medicine | Admitting: Emergency Medicine

## 2014-11-24 ENCOUNTER — Encounter: Payer: Self-pay | Admitting: Emergency Medicine

## 2014-11-24 DIAGNOSIS — S6991XA Unspecified injury of right wrist, hand and finger(s), initial encounter: Secondary | ICD-10-CM | POA: Diagnosis present

## 2014-11-24 DIAGNOSIS — Z79811 Long term (current) use of aromatase inhibitors: Secondary | ICD-10-CM | POA: Insufficient documentation

## 2014-11-24 DIAGNOSIS — Y998 Other external cause status: Secondary | ICD-10-CM | POA: Insufficient documentation

## 2014-11-24 DIAGNOSIS — S20311A Abrasion of right front wall of thorax, initial encounter: Secondary | ICD-10-CM | POA: Insufficient documentation

## 2014-11-24 DIAGNOSIS — Z791 Long term (current) use of non-steroidal anti-inflammatories (NSAID): Secondary | ICD-10-CM | POA: Insufficient documentation

## 2014-11-24 DIAGNOSIS — Z79899 Other long term (current) drug therapy: Secondary | ICD-10-CM | POA: Insufficient documentation

## 2014-11-24 DIAGNOSIS — Y92009 Unspecified place in unspecified non-institutional (private) residence as the place of occurrence of the external cause: Secondary | ICD-10-CM | POA: Diagnosis not present

## 2014-11-24 DIAGNOSIS — E119 Type 2 diabetes mellitus without complications: Secondary | ICD-10-CM | POA: Insufficient documentation

## 2014-11-24 DIAGNOSIS — S60811A Abrasion of right wrist, initial encounter: Secondary | ICD-10-CM | POA: Diagnosis not present

## 2014-11-24 DIAGNOSIS — T07XXXA Unspecified multiple injuries, initial encounter: Secondary | ICD-10-CM

## 2014-11-24 DIAGNOSIS — Z7982 Long term (current) use of aspirin: Secondary | ICD-10-CM | POA: Insufficient documentation

## 2014-11-24 DIAGNOSIS — Y9389 Activity, other specified: Secondary | ICD-10-CM | POA: Diagnosis not present

## 2014-11-24 MED ORDER — BACITRACIN ZINC 500 UNIT/GM EX OINT
1.0000 "application " | TOPICAL_OINTMENT | Freq: Two times a day (BID) | CUTANEOUS | Status: DC
  Administered 2014-11-24: 1 via TOPICAL
  Filled 2014-11-24: qty 0.9
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
## 2014-11-24 NOTE — Discharge Instructions (Signed)

## 2014-12-01 ENCOUNTER — Ambulatory Visit
Admission: RE | Admit: 2014-12-01 | Discharge: 2014-12-01 | Disposition: A | Discharge status: Home / Self Care | Payer: Medicare Other | Source: Ambulatory Visit | Attending: Family Medicine | Admitting: Family Medicine

## 2014-12-01 ENCOUNTER — Other Ambulatory Visit: Payer: Self-pay | Admitting: Family Medicine

## 2014-12-01 DIAGNOSIS — M47816 Spondylosis without myelopathy or radiculopathy, lumbar region: Secondary | ICD-10-CM | POA: Diagnosis not present

## 2014-12-01 DIAGNOSIS — M545 Low back pain: Secondary | ICD-10-CM | POA: Diagnosis present

## 2014-12-01 DIAGNOSIS — I2510 Atherosclerotic heart disease of native coronary artery without angina pectoris: Secondary | ICD-10-CM | POA: Diagnosis not present

## 2014-12-01 DIAGNOSIS — M25551 Pain in right hip: Secondary | ICD-10-CM

## 2014-12-01 DIAGNOSIS — M8580 Other specified disorders of bone density and structure, unspecified site: Secondary | ICD-10-CM | POA: Insufficient documentation

## 2014-12-07 ENCOUNTER — Encounter: Payer: Self-pay | Admitting: Emergency Medicine

## 2014-12-07 ENCOUNTER — Emergency Department: Payer: Medicare Other

## 2014-12-07 ENCOUNTER — Emergency Department
Admission: EM | Admit: 2014-12-07 | Discharge: 2014-12-07 | Disposition: A | Discharge status: Home / Self Care | Payer: Medicare Other | Attending: Emergency Medicine | Admitting: Emergency Medicine

## 2014-12-07 DIAGNOSIS — S99911A Unspecified injury of right ankle, initial encounter: Secondary | ICD-10-CM | POA: Diagnosis present

## 2014-12-07 DIAGNOSIS — Z7982 Long term (current) use of aspirin: Secondary | ICD-10-CM | POA: Diagnosis not present

## 2014-12-07 DIAGNOSIS — E119 Type 2 diabetes mellitus without complications: Secondary | ICD-10-CM | POA: Diagnosis not present

## 2014-12-07 DIAGNOSIS — Z79899 Other long term (current) drug therapy: Secondary | ICD-10-CM | POA: Insufficient documentation

## 2014-12-07 DIAGNOSIS — Y998 Other external cause status: Secondary | ICD-10-CM | POA: Diagnosis not present

## 2014-12-07 DIAGNOSIS — Z791 Long term (current) use of non-steroidal anti-inflammatories (NSAID): Secondary | ICD-10-CM | POA: Diagnosis not present

## 2014-12-07 DIAGNOSIS — Y92000 Kitchen of unspecified non-institutional (private) residence as  the place of occurrence of the external cause: Secondary | ICD-10-CM | POA: Insufficient documentation

## 2014-12-07 DIAGNOSIS — S93401A Sprain of unspecified ligament of right ankle, initial encounter: Secondary | ICD-10-CM

## 2014-12-07 DIAGNOSIS — Y9389 Activity, other specified: Secondary | ICD-10-CM | POA: Diagnosis not present

## 2014-12-07 DIAGNOSIS — X58XXXA Exposure to other specified factors, initial encounter: Secondary | ICD-10-CM | POA: Diagnosis not present

## 2014-12-07 DIAGNOSIS — M25471 Effusion, right ankle: Secondary | ICD-10-CM

## 2014-12-07 HISTORY — DX: Atherosclerotic heart disease of native coronary artery without angina pectoris: I25.10

## 2014-12-07 NOTE — ED Notes (Signed)
Pain and swelling to right ankle
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
## 2014-12-07 NOTE — Discharge Instructions (Signed)

## 2014-12-09 ENCOUNTER — Other Ambulatory Visit: Payer: Self-pay | Admitting: Family Medicine

## 2014-12-09 DIAGNOSIS — Z1231 Encounter for screening mammogram for malignant neoplasm of breast: Secondary | ICD-10-CM

## 2014-12-12 ENCOUNTER — Emergency Department: Payer: Medicare Other

## 2014-12-12 ENCOUNTER — Emergency Department
Admission: EM | Admit: 2014-12-12 | Discharge: 2014-12-12 | Disposition: A | Discharge status: Home / Self Care | Payer: Medicare Other | Attending: Emergency Medicine | Admitting: Emergency Medicine

## 2014-12-12 DIAGNOSIS — Z79899 Other long term (current) drug therapy: Secondary | ICD-10-CM | POA: Diagnosis not present

## 2014-12-12 DIAGNOSIS — E119 Type 2 diabetes mellitus without complications: Secondary | ICD-10-CM | POA: Diagnosis not present

## 2014-12-12 DIAGNOSIS — L03116 Cellulitis of left lower limb: Secondary | ICD-10-CM | POA: Diagnosis not present

## 2014-12-12 DIAGNOSIS — Z791 Long term (current) use of non-steroidal anti-inflammatories (NSAID): Secondary | ICD-10-CM | POA: Insufficient documentation

## 2014-12-12 DIAGNOSIS — Z792 Long term (current) use of antibiotics: Secondary | ICD-10-CM | POA: Diagnosis not present

## 2014-12-12 DIAGNOSIS — E860 Dehydration: Secondary | ICD-10-CM | POA: Diagnosis not present

## 2014-12-12 DIAGNOSIS — Z7982 Long term (current) use of aspirin: Secondary | ICD-10-CM | POA: Diagnosis not present

## 2014-12-12 DIAGNOSIS — R2242 Localized swelling, mass and lump, left lower limb: Secondary | ICD-10-CM | POA: Diagnosis present

## 2014-12-12 LAB — COMPREHENSIVE METABOLIC PANEL
ALT: 9 U/L — AB (ref 14–54)
ANION GAP: 5 (ref 5–15)
AST: 12 U/L — ABNORMAL LOW (ref 15–41)
Albumin: 3 g/dL — ABNORMAL LOW (ref 3.5–5.0)
Alkaline Phosphatase: 63 U/L (ref 38–126)
BUN: 25 mg/dL — ABNORMAL HIGH (ref 6–20)
CHLORIDE: 108 mmol/L (ref 101–111)
CO2: 24 mmol/L (ref 22–32)
CREATININE: 1.04 mg/dL — AB (ref 0.44–1.00)
Calcium: 9.5 mg/dL (ref 8.9–10.3)
GFR calc non Af Amer: 60 mL/min — ABNORMAL LOW (ref >60)
Glucose, Bld: 128 mg/dL — ABNORMAL HIGH (ref 65–99)
Potassium: 4.7 mmol/L (ref 3.5–5.1)
SODIUM: 137 mmol/L (ref 135–145)
Total Bilirubin: 0.5 mg/dL (ref 0.3–1.2)
Total Protein: 6.5 g/dL (ref 6.5–8.1)

## 2014-12-12 LAB — CBC WITH DIFFERENTIAL/PLATELET
BASOS ABS: 0 10*3/uL (ref 0–0.1)
BASOS PCT: 1 %
EOS ABS: 0.1 10*3/uL (ref 0–0.7)
EOS PCT: 2 %
HCT: 31.2 % — ABNORMAL LOW (ref 35.0–47.0)
Hemoglobin: 10.2 g/dL — ABNORMAL LOW (ref 12.0–16.0)
LYMPHS ABS: 1.2 10*3/uL (ref 1.0–3.6)
Lymphocytes Relative: 15 %
MCH: 28.2 pg (ref 26.0–34.0)
MCHC: 32.6 g/dL (ref 32.0–36.0)
MCV: 86.3 fL (ref 80.0–100.0)
Monocytes Absolute: 0.8 10*3/uL (ref 0.2–0.9)
Monocytes Relative: 10 %
Neutro Abs: 5.9 10*3/uL (ref 1.4–6.5)
Neutrophils Relative %: 72 %
PLATELETS: 368 10*3/uL (ref 150–440)
RBC: 3.61 MIL/uL — AB (ref 3.80–5.20)
RDW: 15.3 % — ABNORMAL HIGH (ref 11.5–14.5)
WBC: 8 10*3/uL (ref 3.6–11.0)

## 2014-12-12 LAB — PROTIME-INR
INR: 0.89
Prothrombin Time: 12.2 seconds (ref 11.4–15.0)

## 2014-12-12 MED ORDER — SULFAMETHOXAZOLE-TRIMETHOPRIM 800-160 MG PO TABS: 1.0000 | ORAL_TABLET | Freq: Two times a day (BID) | ORAL | Status: DC

## 2014-12-12 MED ORDER — CEPHALEXIN 500 MG PO CAPS: 500.0000 mg | ORAL_CAPSULE | Freq: Three times a day (TID) | ORAL | Status: AC

## 2014-12-12 NOTE — ED Notes (Signed)
Pt has swelling to LLE that started last week. Redness noted from behind knee down to ankle. Swelling noted starting in knee and going to ankle.
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
## 2014-12-12 NOTE — ED Provider Notes (Signed)
[facility] Emergency Department Provider Note ____________________________________________  Time seen: Approximately 1:00 PM  I have reviewed the triage vital signs and the nursing notes.   HISTORY  Chief Complaint Leg Swelling  History obtained from patient and from caregiver.   HPI Elizabeth Gutierrez is a 54 y.o. female presenting with left lower leg swelling, erythema, and pain. Patient states that leg has been swollen for a few weeks, but has become much more swollen, painful, red, and warm to the touch since last Saturday. Pain is 8/10 tight pain that is occasionally sharp. She states that the pain is located from the knee down to the ankle and worsens with movement, particularly dorsiflexion. She finds it increasingly difficult to walk on the leg, causing her to limp. Patient has tried Ibuprofen, "fluid pills," and elevating the leg, which have not resolved the symptoms. Patient states she fell on Saturday, but caregiver did not know that this fall occurred. Patient denies any recent insect bites or cut to the leg. No other known trauma to the leg. Patient denies history of DVT or cellulitis. No recent long travel involving sitting for long periods of time. Patient is not on any OCP and has denies history of cancer.   Patient was seen in ED on Sunday for right ankle pain and was given a splint and Ibuprofen and told to follow up with orthopedics. Patient denies fever, chills, chest pain, SOB, nausea, vomiting, diarrhea, and constipation.    Past Medical History  Diagnosis Date  . Diabetes   . Heart attack   . Coronary artery disease     There are no active problems to display for this patient.   Past Surgical History  Procedure Laterality Date  . Foot surgery Right     Current Outpatient Rx  Name  Route  Sig  Dispense  Refill  . acetaminophen (TYLENOL) 325 MG tablet   Oral   Take 325 mg by mouth every 4 (four) hours as needed. 2 TABLETS BY MOUTH EVERY  FOUR HOURS AS NEEDED         . ARIPiprazole (ABILIFY) 30 MG tablet   Oral   Take 30 mg by mouth daily.         Marland Kitchen aspirin 81 MG tablet   Oral   Take 81 mg by mouth daily.         Marland Kitchen atorvastatin (LIPITOR) 20 MG tablet   Oral   Take 20 mg by mouth every evening.         . benazepril (LOTENSIN) 5 MG tablet   Oral   Take 5 mg by mouth daily.         . benztropine (COGENTIN) 0.5 MG tablet   Oral   Take 0.5 mg by mouth 2 (two) times daily.         . calcium carbonate (TUMS - DOSED IN MG ELEMENTAL CALCIUM) 500 MG chewable tablet   Oral   Chew 1 tablet by mouth every 4 (four) hours as needed for indigestion or heartburn.         . carvedilol (COREG) 3.125 MG tablet   Oral   Take 3.125 mg by mouth 2 (two) times daily with a meal.         . cephALEXin (KEFLEX) 500 MG capsule   Oral   Take 1 capsule (500 mg total) by mouth 3 (three) times daily.   30 capsule   0   . docusate sodium (COLACE) 100 MG capsule  Oral   Take 100 mg by mouth 2 (two) times daily.         Marland Kitchen FLUPHENAZINE DECANOATE IJ   Injection   Inject 25 mg as directed. INJECT INTO THE MUSCLE EVERY TWO WEEKS         . furosemide (LASIX) 20 MG tablet   Oral   Take 20 mg by mouth daily.         Marland Kitchen glucose blood (TRUETRACK TEST) test strip   Other   1 each by Other route as needed for other. Use as instructed         . guaifenesin (ROBITUSSIN) 100 MG/5ML syrup   Oral   Take 200 mg by mouth every 6 (six) hours as needed for cough.         . Lancets MISC   Does not apply   by Does not apply route.         Marland Kitchen levothyroxine (SYNTHROID, LEVOTHROID) 50 MCG tablet   Oral   Take 50 mcg by mouth daily before breakfast.         . LORazepam (ATIVAN) 0.5 MG tablet   Oral   Take 0.5 mg by mouth 2 (two) times daily.         . meloxicam (MOBIC) 7.5 MG tablet   Oral   Take 7.5 mg by mouth daily.         . metFORMIN (GLUCOPHAGE) 850 MG tablet   Oral   Take 850 mg by mouth 2 (two)  times daily with a meal.         . NITROGLYCERIN SL   Sublingual   Place 0.4 mg under the tongue. 1 SL EVERY 5 MIN AS NEEDED FOR CHEST PAIN         . omeprazole (PRILOSEC) 40 MG capsule   Oral   Take 40 mg by mouth daily.         . polyethylene glycol powder (MIRALAX) powder   Oral   Take 1 Container by mouth once. 17 G OF POWDER , 1 HEAPING TABLESPOON DISSOLVED IN 8 OZ OF WATER , ONCE DAILY 3 DAYS PER WEEK         . senna (SENOKOT) 8.6 MG tablet   Oral   Take 1 tablet by mouth daily. 1-2 BY MOUTH AT BEDTIME         . sertraline (ZOLOFT) 100 MG tablet   Oral   Take 100 mg by mouth daily.         Marland Kitchen sulfamethoxazole-trimethoprim (BACTRIM DS,SEPTRA DS) 800-160 MG per tablet   Oral   Take 1 tablet by mouth 2 (two) times daily.   20 tablet   0     Allergies Review of patient's allergies indicates no known allergies.  No family history on file.  Social History Social History  Substance Use Topics  . Smoking status: Never Smoker   . Smokeless tobacco: Former Systems developer  . Alcohol Use: No    Review of Systems Constitutional: No fever/chills Eyes: No visual changes. ENT: No sore throat. Cardiovascular: Denies chest pain. Respiratory: Denies shortness of breath. Gastrointestinal:  No abdominal pain.  No nausea, no vomiting.  No diarrhea.  No constipation. Genitourinary: Negative for dysuria. Musculoskeletal: Negative for back pain. Skin: Negative for rash. Positive for erythema and swelling of the left lower extremity.  Neurological: Negative for headaches, focal weakness or numbness. Gait is unstable due to the pain in the left leg.  Hematological/Lymphatic:no history of DVT or cellulitis.  Allergic/Immunilogical: no known allergies 10-point ROS otherwise negative.  ____________________________________________   PHYSICAL EXAM:  VITAL SIGNS: ED Triage Vitals  Enc Vitals Group     BP 12/12/14 1119 108/80 mmHg     Pulse Rate 12/12/14 1119 74     Resp  12/12/14 1119 18     Temp 12/12/14 1119 98.5 F (36.9 C)     Temp Source 12/12/14 1119 Oral     SpO2 12/12/14 1119 100 %     Weight 12/12/14 1119 195 lb (88.451 kg)     Height 12/12/14 1119 5\' 8"  (1.727 m)     Head Cir --      Peak Flow --      Pain Score 12/12/14 1120 8     Pain Loc --      Pain Edu? --      Excl. in [place]? --     Constitutional: Alert and oriented. Well appearing and in no acute distress. Eyes: Conjunctivae are normal. PERRL. EOMI. Head: Atraumatic. Nose: No congestion/rhinnorhea. Mouth/Throat: Mucous membranes are moist.  Oropharynx non-erythematous. Neck: No stridor.   Hematological/Lymphatic/Immunilogical: No cervical lymphadenopathy. Cardiovascular: Normal rate, regular rhythm. Grossly normal heart sounds.  Good peripheral circulation. Respiratory: Normal respiratory effort.  No retractions. Lungs CTAB. Gastrointestinal: Soft and nontender. No distention. No abdominal bruits. No CVA tenderness. Musculoskeletal: Left lower leg is edematous from knee to ankle with erythema and warm to the touch. Left lower leg 17 cm at mid-calf and 9 cm at ankle. Right lower leg 15 cm at mid-calf and 8 cm at ankle. Reduced ROM to left ankle. Strength 5/5 bilaterally.  Neurologic:  Normal speech and language. No gross focal neurologic deficits are appreciated. Some gait instability due to pain in left leg. Sensation intact bilaterally.  Skin:  Skin is dry and intact. No rash noted. Erythema and edema to left lower leg that is also warm to the touch.  Psychiatric: Mood and affect are normal. Speech and behavior are normal.  ____________________________________________   LABS (all labs ordered are listed, but only abnormal results are displayed)  Labs Reviewed  CBC WITH DIFFERENTIAL/PLATELET - Abnormal; Notable for the following:    RBC 3.61 (*)    Hemoglobin 10.2 (*)    HCT 31.2 (*)    RDW 15.3 (*)    All other components within normal limits  COMPREHENSIVE METABOLIC PANEL -  Abnormal; Notable for the following:    Glucose, Bld 128 (*)    BUN 25 (*)    Creatinine, Ser 1.04 (*)    Albumin 3.0 (*)    AST 12 (*)    ALT 9 (*)    GFR calc non Af Amer 60 (*)    All other components within normal limits  PROTIME-INR   ____________________________________________  EKG  None ____________________________________________  RADIOLOGY  US Venous Img Lower Unilateral Left:  IMPRESSION: 1. No evidence for occlusive deep vein thrombosis. 2. Fluid collection within the right popliteal fossa. ____________________________________________   PROCEDURES  Procedure(s) performed: None  Critical Care performed: No  ____________________________________________   INITIAL IMPRESSION / ASSESSMENT AND PLAN / ED COURSE  Pertinent labs & imaging results that were available during my care of the patient were reviewed by me and considered in my medical decision making (see chart for details).  Patient will start Bactrim and Keflex. She is to follow-up with her primary care provider on Monday for recheck. She was given strict ER return precautions for the weekend. She was advised to increase the amount  of fluids that she is drinking due to mild dehydration. Patient's caregiver is aware of the instructions and orders were for the care home chart indicating the need for increase fluid intake as well as antibiotic orders and follow-up with primary care on Monday ____________________________________________   FINAL CLINICAL IMPRESSION(S) / ED DIAGNOSES  Final diagnoses:  Cellulitis of left leg  Dehydration      Victorino Dike, FNP 12/12/14 1711  Ahmed Prima, MD 12/22/14 2329

## 2014-12-12 NOTE — Discharge Instructions (Signed)

## 2014-12-15 ENCOUNTER — Ambulatory Visit (INDEPENDENT_AMBULATORY_CARE_PROVIDER_SITE_OTHER): Payer: Medicare Other | Admitting: Podiatry

## 2014-12-15 DIAGNOSIS — B351 Tinea unguium: Secondary | ICD-10-CM | POA: Diagnosis not present

## 2014-12-15 DIAGNOSIS — E1140 Type 2 diabetes mellitus with diabetic neuropathy, unspecified: Secondary | ICD-10-CM

## 2014-12-15 DIAGNOSIS — M79676 Pain in unspecified toe(s): Secondary | ICD-10-CM

## 2014-12-15 DIAGNOSIS — E1149 Type 2 diabetes mellitus with other diabetic neurological complication: Secondary | ICD-10-CM

## 2014-12-15 NOTE — Progress Notes (Signed)
Patient ID: LYRIQUE HAKIM, female   DOB: 1960-11-30, 54 y.o.   MRN: [id number]  Subjective: 54 y.o.-year-old female returns the office today for painful, elongated, thickened toenails which she is unable to trim herself. Denies any redness or drainage around the nails. She states that she has been having some pain to her right foot and ankle which has been ongoing for several months. She said that she has no pain at rest or with palpation to the area although she does have pain to her foot when walking. She denies any history of injury, and she denies any change or increase activities and come onto the symptoms. The majority the pain is localized to the top and outside aspect of her foot. Had X-ray recently for R ankle/midfoot symptoms. Denies any other acute changes since last appointment and no new complaints today. Denies any systemic complaints such as fevers, chills, nausea, vomiting. On oral antibiotic for recent L leg cellulitis. Diagnosis.  Objective: AAO 3, NAD DP/PT pulses palpable, CRT less than 3 seconds Protective sensation decreased with Simms Weinstein monofilament Nails hypertrophic, dystrophic, elongated, brittle, discolored 10. There is tenderness overlying the nails 1-5 bilaterally. There is no surrounding erythema or drainage along the nail sites. No open lesions or pre-ulcerative lesions are identified. There is a decrease in medial arch height upon weightbearing bilaterally with hammertoe contractures present. There is currently no areas of tenderness overlying the foot/ankle bilaterally there is no pain or crepitation with ankle, subtalar, midtarsal, MTPJ range of motion bilaterally. There is mild edema to the right foot without any associated erythema or increase in warmth.  No other areas of tenderness bilateral lower extremities at this time No pain with calf compression, swelling, warmth, erythema.  Assessment: Patient presents with symptomatic onychomycosis; recent L leg  cellulitis.    Plan: -Treatment options including alternatives, risks, complications were discussed -Previous x-rays which were recently obtained at the hospital on 09/04/2014 were reviewed which revealed no acute abnormalities to the ankle and there is mild degenerative changes in the midfoot as well as a small calcaneal spur.  -At today's appointment she was scanned and fitted for diabetic shoes.  -Nail sharply debrided 10 without complication/bleeding.  -Discussed daily foot inspection. If there are any changes, to call the office immediately.  -Follow-up in 3 months or sooner if any problems are to arise. In the meantime, encouraged to call the office with any questions, concerns, changes symptoms. If the right foot/ankle pain is not resolved within the next 3-4 weeks, recommended her/caregiver to call for earlier follow-up.  - taking oral AB for L leg cellulitis  Roselind Messier DPM

## 2014-12-18 ENCOUNTER — Other Ambulatory Visit: Payer: Self-pay | Admitting: Family Medicine

## 2014-12-18 ENCOUNTER — Ambulatory Visit
Admission: RE | Admit: 2014-12-18 | Discharge: 2014-12-18 | Disposition: A | Discharge status: Home / Self Care | Payer: Medicare Other | Source: Ambulatory Visit | Attending: Family Medicine | Admitting: Family Medicine

## 2014-12-18 DIAGNOSIS — Z1231 Encounter for screening mammogram for malignant neoplasm of breast: Secondary | ICD-10-CM | POA: Diagnosis present

## 2015-01-05 DIAGNOSIS — G4733 Obstructive sleep apnea (adult) (pediatric): Secondary | ICD-10-CM | POA: Insufficient documentation

## 2015-03-17 ENCOUNTER — Encounter: Payer: Self-pay | Admitting: Sports Medicine

## 2015-03-17 ENCOUNTER — Ambulatory Visit (INDEPENDENT_AMBULATORY_CARE_PROVIDER_SITE_OTHER): Payer: Medicare Other | Admitting: Sports Medicine

## 2015-03-17 DIAGNOSIS — M79676 Pain in unspecified toe(s): Secondary | ICD-10-CM | POA: Diagnosis not present

## 2015-03-17 DIAGNOSIS — B351 Tinea unguium: Secondary | ICD-10-CM | POA: Diagnosis not present

## 2015-03-17 DIAGNOSIS — E1149 Type 2 diabetes mellitus with other diabetic neurological complication: Secondary | ICD-10-CM | POA: Diagnosis not present

## 2015-03-17 NOTE — Progress Notes (Signed)
Patient ID: XANDRIA LECHTENBERG, female   DOB: May 07, 1960, 54 y.o.   MRN: [id number] Subjective: KELY MAGER is a 54 y.o. female patient with history of type 2 diabetes who presents to office today complaining of long, painful nails  while ambulating in shoes; unable to trim. Patient states that the glucose reading this morning was not recorded. Patient denies any new changes in medication or new problems. Patient denies any new cramping, numbness, burning or tingling in the legs.  There are no active problems to display for this patient.  Current Outpatient Prescriptions on File Prior to Visit  Medication Sig Dispense Refill  . acetaminophen (TYLENOL) 325 MG tablet Take 325 mg by mouth every 4 (four) hours as needed. 2 TABLETS BY MOUTH EVERY FOUR HOURS AS NEEDED    . ARIPiprazole (ABILIFY) 30 MG tablet Take 30 mg by mouth daily.    Marland Kitchen aspirin 81 MG tablet Take 81 mg by mouth daily.    Marland Kitchen atorvastatin (LIPITOR) 20 MG tablet Take 20 mg by mouth every evening.    . benazepril (LOTENSIN) 5 MG tablet Take 5 mg by mouth daily.    . benztropine (COGENTIN) 0.5 MG tablet Take 0.5 mg by mouth 2 (two) times daily.    . calcium carbonate (TUMS - DOSED IN MG ELEMENTAL CALCIUM) 500 MG chewable tablet Chew 1 tablet by mouth every 4 (four) hours as needed for indigestion or heartburn.    . carvedilol (COREG) 3.125 MG tablet Take 3.125 mg by mouth 2 (two) times daily with a meal.    . docusate sodium (COLACE) 100 MG capsule Take 100 mg by mouth 2 (two) times daily.    Marland Kitchen FLUPHENAZINE DECANOATE IJ Inject 25 mg as directed. INJECT INTO THE MUSCLE EVERY TWO WEEKS    . furosemide (LASIX) 20 MG tablet Take 20 mg by mouth daily.    Marland Kitchen glucose blood (TRUETRACK TEST) test strip 1 each by Other route as needed for other. Use as instructed    . guaifenesin (ROBITUSSIN) 100 MG/5ML syrup Take 200 mg by mouth every 6 (six) hours as needed for cough.    . Lancets MISC by Does not apply route.    Marland Kitchen levothyroxine (SYNTHROID,  LEVOTHROID) 50 MCG tablet Take 50 mcg by mouth daily before breakfast.    . LORazepam (ATIVAN) 0.5 MG tablet Take 0.5 mg by mouth 2 (two) times daily.    . meloxicam (MOBIC) 7.5 MG tablet Take 7.5 mg by mouth daily.    . metFORMIN (GLUCOPHAGE) 850 MG tablet Take 850 mg by mouth 2 (two) times daily with a meal.    . NITROGLYCERIN SL Place 0.4 mg under the tongue. 1 SL EVERY 5 MIN AS NEEDED FOR CHEST PAIN    . omeprazole (PRILOSEC) 40 MG capsule Take 40 mg by mouth daily.    . polyethylene glycol powder (MIRALAX) powder Take 1 Container by mouth once. 17 G OF POWDER , 1 HEAPING TABLESPOON DISSOLVED IN 8 OZ OF WATER , ONCE DAILY 3 DAYS PER WEEK    . senna (SENOKOT) 8.6 MG tablet Take 1 tablet by mouth daily. 1-2 BY MOUTH AT BEDTIME    . sertraline (ZOLOFT) 100 MG tablet Take 100 mg by mouth daily.    Marland Kitchen sulfamethoxazole-trimethoprim (BACTRIM DS,SEPTRA DS) 800-160 MG per tablet Take 1 tablet by mouth 2 (two) times daily. 20 tablet 0   No current facility-administered medications on file prior to visit.   No Known Allergies   Objective: General: Patient  is awake, alert, and oriented x 3 and in no acute distress.  Integument: Skin is warm, dry and supple bilateral. Nails are tender, long, thickened and  dystrophic with subungual debris, consistent with onychomycosis, 1-5 bilateral. No signs of infection. No open lesions or preulcerative lesions present bilateral. Remaining integument unremarkable.  Vasculature:  Dorsalis Pedis pulse 2/4 bilateral. Posterior Tibial pulse  1/4 bilateral.  Capillary fill time <3 sec 1-5 bilateral. Scant hair growth to the level of the digits. Temperature gradient within normal limits. Mild varicosities present bilateral. Trace edema present bilateral.   Neurology: The patient has diminished sensation measured with a 5.07/10g Semmes Weinstein Monofilament at all pedal sites bilateral . Vibratory sensation diminished bilateral with tuning fork. No Babinski sign present  bilateral.   Musculoskeletal: Asymptomatic planus and hammertoe gross pedal deformities noted bilateral. Muscular strength 5/5 in all lower extremity muscular groups bilateral without pain or limitation on range of motion . No tenderness with calf compression bilateral.  Assessment and Plan: Problem List Items Addressed This Visit    None    Visit Diagnoses    Dermatophytosis of nail    -  Primary    Pain of toe, unspecified laterality        Type II diabetes mellitus with neurological manifestations ([place])          -Examined patient. -Discussed and educated patient on diabetic foot care, especially with  regards to the vascular, neurological and musculoskeletal systems.  -Stressed the importance of good glycemic control and the detriment of not  controlling glucose levels in relation to the foot. -Mechanically debrided all nails 1-5 bilateral using sterile nail nipper and filed with dremel without incident  -Recommend ice and elevation as needed for edema control -Recommend good supportive shoes daily for foot type -Answered all patient questions -Patient to return in 3 months for at risk foot care -Patient advised to call the office if any problems or questions arise in the meantime.  Landis Martins, DPM

## 2015-03-18 ENCOUNTER — Ambulatory Visit: Payer: Medicare Other | Admitting: Podiatry

## 2015-05-04 ENCOUNTER — Ambulatory Visit: Payer: Medicare Other | Attending: Orthopedic Surgery | Admitting: Physical Therapy

## 2015-05-04 DIAGNOSIS — G8929 Other chronic pain: Secondary | ICD-10-CM | POA: Diagnosis present

## 2015-05-04 DIAGNOSIS — M5441 Lumbago with sciatica, right side: Secondary | ICD-10-CM | POA: Diagnosis present

## 2015-05-04 DIAGNOSIS — R262 Difficulty in walking, not elsewhere classified: Secondary | ICD-10-CM | POA: Diagnosis present

## 2015-05-05 ENCOUNTER — Other Ambulatory Visit: Payer: Self-pay | Admitting: Family Medicine

## 2015-05-05 DIAGNOSIS — M545 Low back pain: Secondary | ICD-10-CM

## 2015-05-05 NOTE — Therapy (Signed)
[place] [facility] [address], [place], [address] Phone: [phone]   Fax:  [phone]  Physical Therapy Evaluation  Patient Details  Name: Elizabeth Gutierrez MRN: [id number] Date of Birth: Apr 20, 1960 No Data Recorded  Encounter Date: 05/04/2015      PT End of Session - 05/04/15 1125    Visit Number 1   Number of Visits 9   Date for PT Re-Evaluation 06/08/15   PT Start Time 1003   PT Stop Time 1100   PT Time Calculation (min) 57 min   Activity Tolerance Patient tolerated treatment well   Behavior During Therapy [facility] for tasks assessed/performed      Past Medical History  Diagnosis Date  . Diabetes ([place])   . Heart attack ([place])   . Coronary artery disease     Past Surgical History  Procedure Laterality Date  . Foot surgery Right     There were no vitals filed for this visit.  Visit Diagnosis:  Chronic low back pain with right-sided sciatica, unspecified back pain laterality - Plan: PT plan of care cert/re-cert  Difficulty walking - Plan: PT plan of care cert/re-cert      Subjective Assessment - 05/04/15 1119    Subjective Patient reports she began to have R hip pain that radiates down her leg, leading to a "stinging" sensation in her calf. She reports it hurts worst first thing in the morning, then seems to improve throughout the day. She reports it hurts to lay on it, and she has pain every morning. Occasionally she has trouble sleeping because of it. Patient sleeps on her L side.    Patient is accompained by: --  Group home representative.    Limitations Standing;Walking   Patient Stated Goals To have less pain    Currently in Pain? Yes   Pain Score 5    Pain Location Leg   Pain Orientation Right   Pain Descriptors / Indicators --  Stinging   Pain Type Chronic pain   Pain Radiating Towards R calf, occasionally R groin area.    Pain Onset More than a month ago   Pain Frequency Intermittent    Aggravating Factors  Worst first thing in the morning, sit to stand transfer   Pain Relieving Factors Walking, hot shower head.             [facility] PT Assessment - 05/04/15 1149    Assessment   Medical Diagnosis --  OA of spine with radiculopathy, lumbar region   Referring Provider --  Rachelle Hora    Precautions   Precautions None   Restrictions   Weight Bearing Restrictions No   Balance Screen   Has the patient fallen in the past 6 months --  She denies, but her chart indicates she has?   Home Environment   Living Environment Group home   Prior Function   Level of Independence Independent   Vocation Requirements --  Sounds like she works for [employer]? Unclear duties?   Cognition   Overall Cognitive Status History of cognitive impairments - at baseline   Observation/Other Assessments   Modified [place] 32   Sensation   Light Touch Not tested   Sit to Stand   Comments --  Initially painful   Posture/Postural Control   Posture Comments --  Swayback, with L Lateral shift in gait   Strength   Overall Strength Comments --  Knee flexors, extensors 5/5, 5/5 at ankles.  Slump test   Findings Negative   Straight Leg Raise   Findings Negative   Saralyn Pilar (FABER) Test   Findings Negative   Ely's Test   Findings Negative   Ambulation/Gait   Gait Pattern --  Knees flexed bilaterally during ambulation. L lateral shift      Manual treatment  Lumbar CPAs grade III-IV for 30" at each level from T10 inferiorly to L5. Patient reports these feels good, no joint signs. No change in symptoms with sit to stand  Piriformis stretching in supine x 10 bilaterally with PT directed tx, no real change in symptoms. L Lateral shift correction in standing glides x 30" bouts for 3 repetitions (patient reported no pain afterwards)  Attempted gait training with quad cane, did improve lateral shift, however patient demonstrates poor understanding of sequencing and is unable to change gait  pattern with cuing.                     PT Education - 05/04/15 1140    Education provided Yes   Education Details Educated patient to sleep with a pillow between her legs, log rolling to enter/exit bed.    Person(s) Educated Patient;Caregiver(s)   Methods Explanation;Demonstration   Comprehension Verbalized understanding;Returned demonstration;Need further instruction             PT Long Term Goals - 05/04/15 1136    PT LONG TERM GOAL #1   Title Patient will complete 5 sit to stands with no increase in pain and no use of hands to demonstrate increased tolerance for OOB mobility.    Baseline Pain with sit to stand, requires use of UEs.    Time 4   Period Weeks   Status New   PT LONG TERM GOAL #2   Title Patient will report an ODI score of less than 20% disability to demonstrate improved tolerance for ADLs.    Baseline 32% without items 7, 8, 9.   Time 4   Period Weeks   Status New   PT LONG TERM GOAL #3   Title Patient will report worst pain of less than 4/10 on VAS scale to demonstrate increased tolerance for ADLs.    Baseline 7/10   Time 4   Period Weeks   Status New   PT LONG TERM GOAL #4   Title Patient will report no pain in the morning for at least 3 consecutive days to demonstrate improved tolerance for sleep to complete ADLs.    Baseline Pain with waking every morning.    Time 4   Period Weeks   Status New               Plan - 05/04/15 1130    Clinical Impression Statement Patient presents with R sided hip pain, with L lateral shift and swayback lumbar pattern. Patient initially aggravated in examination, and appears to apply tension in sleeping as she sleeps on L side. Educated patient to utilize pillow between legs during sleep. Patient responded quite well to lateral shift correction and reported no pain with sit to stand transfer or gait afterwards. Patient would benefit from continued skilled PT to monitor and progress treatment to  allow patient to return to full ADL participation.    Pt will benefit from skilled therapeutic intervention in order to improve on the following deficits Abnormal gait;Difficulty walking;Obesity;Pain;Postural dysfunction;Decreased strength;Impaired flexibility   Rehab Potential Fair   Clinical Impairments Affecting Rehab Potential Cognitive status   PT Frequency 2x / week  PT Duration 4 weeks   PT Treatment/Interventions Moist Heat;Therapeutic exercise;Therapeutic activities;Balance training;Taping;Patient/family education;Gait training   PT Next Visit Plan Re-assess lateral shift, progress stretching program as tolerated.   PT Home Exercise Plan Pillow between her legs while sleeping.    Consulted and Agree with Plan of Care Patient;Family member/caregiver   Family Member Consulted Caregiver from group home.           G-Codes - 2015/05/11 1135    Functional Assessment Tool Used Modified ODI   Functional Limitation Mobility: Walking and moving around   Mobility: Walking and Moving Around Current Status [phone]) At least 20 percent but less than 40 percent impaired, limited or restricted   Mobility: Walking and Moving Around Goal Status [phone]) At least 1 percent but less than 20 percent impaired, limited or restricted       Problem List There are no active problems to display for this patient.  Kerman Passey, PT, DPT    05/05/2015, 1:54 PM  [place] [facility] [address], [place], [address] Phone: [phone]   Fax:  [phone]  Name: Elizabeth Gutierrez MRN: [id number] Date of Birth: 04/23/1960

## 2015-05-07 ENCOUNTER — Ambulatory Visit: Payer: Medicare Other | Admitting: Physical Therapy

## 2015-05-07 DIAGNOSIS — G8929 Other chronic pain: Secondary | ICD-10-CM

## 2015-05-07 DIAGNOSIS — R262 Difficulty in walking, not elsewhere classified: Secondary | ICD-10-CM

## 2015-05-07 DIAGNOSIS — M5441 Lumbago with sciatica, right side: Secondary | ICD-10-CM | POA: Diagnosis not present

## 2015-05-07 NOTE — Therapy (Signed)
[place] [facility] [address], [place], [address] Phone: [phone]   Fax:  [phone]  Physical Therapy Treatment  Patient Details  Name: Elizabeth Gutierrez MRN: [id number] Date of Birth: 05-Jan-1961 No Data Recorded  Encounter Date: 05/07/2015      PT End of Session - 05/07/15 1637    Visit Number 2   Number of Visits 9   Date for PT Re-Evaluation 06/08/15   PT Start Time 1125   PT Stop Time 1203   PT Time Calculation (min) 38 min   Activity Tolerance Patient tolerated treatment well   Behavior During Therapy [facility] for tasks assessed/performed      Past Medical History  Diagnosis Date  . Diabetes ([place])   . Heart attack ([place])   . Coronary artery disease     Past Surgical History  Procedure Laterality Date  . Foot surgery Right     There were no vitals filed for this visit.  Visit Diagnosis:  Chronic low back pain with right-sided sciatica, unspecified back pain laterality  Difficulty walking      Subjective Assessment - 05/07/15 1126    Subjective Patient reports she has felt a little better since her first session. It sounds like less stinging in the calf, but increase pain in her hip. She reports she continues to have R hip pain.    Limitations Standing;Walking   Patient Stated Goals To have less pain    Currently in Pain? Yes   Pain Score 5    Pain Location Hip   Pain Orientation Right   Pain Type Chronic pain   Pain Radiating Towards R TA region, groin   Pain Onset More than a month ago   Pain Frequency Intermittent   Aggravating Factors  Worst first thing in the morning, sit to stand.    Pain Relieving Factors Walking, hot shower head.      Manual Therapy  L Lateral shift correction manually x 30" bouts x2 (went for walk decreased from 5 to 4). 2 additional bouts at the end of the session with reports of decreased symptoms  Long axis distraction to RLE x 30" bouts for 3 repetitions with  reports of relief of symptoms.   TherEx  Single knee to chest x 10 repetitions for 3 sets with breaks between each. Increased to self knee to chest, initially therapist driven.  Piriformis stretching x 8 for 2 sets   Patient reported 2-3/10 pain leaving session with increased gait speed and stride length noted after session.                            PT Education - 05/07/15 1636    Education provided Yes   Education Details Provided single knee to chest for HEP.    Person(s) Educated Patient;Caregiver(s)   Methods Explanation;Demonstration;Handout   Comprehension Verbalized understanding;Returned demonstration;Need further instruction             PT Long Term Goals - 05/04/15 1136    PT LONG TERM GOAL #1   Title Patient will complete 5 sit to stands with no increase in pain and no use of hands to demonstrate increased tolerance for OOB mobility.    Baseline Pain with sit to stand, requires use of UEs.    Time 4   Period Weeks   Status New   PT LONG TERM GOAL #2   Title Patient will report  an ODI score of less than 20% disability to demonstrate improved tolerance for ADLs.    Baseline 32% without items 7, 8, 9.   Time 4   Period Weeks   Status New   PT LONG TERM GOAL #3   Title Patient will report worst pain of less than 4/10 on VAS scale to demonstrate increased tolerance for ADLs.    Baseline 7/10   Time 4   Period Weeks   Status New   PT LONG TERM GOAL #4   Title Patient will report no pain in the morning for at least 3 consecutive days to demonstrate improved tolerance for sleep to complete ADLs.    Baseline Pain with waking every morning.    Time 4   Period Weeks   Status New               Plan - 05/07/15 1637    Clinical Impression Statement Patient reports and demonstrates decreased pain after session. She does demonstrate significant variability in gait pattern throughout ambulation, as she sometimes demonstrates antalgic  pattern with decreased step length, other times she ambulates at self selected speed with no antalgia noted. Patient appears to have decreased hip mobility and decreased lateral shift noted in this session, which appears to relieve pain after tx.    Pt will benefit from skilled therapeutic intervention in order to improve on the following deficits Abnormal gait;Difficulty walking;Obesity;Pain;Postural dysfunction;Decreased strength;Impaired flexibility   Rehab Potential Fair   Clinical Impairments Affecting Rehab Potential Cognitive status   PT Frequency 2x / week   PT Duration 4 weeks   PT Treatment/Interventions Moist Heat;Therapeutic exercise;Therapeutic activities;Balance training;Taping;Patient/family education;Gait training   PT Next Visit Plan Re-assess lateral shift, progress stretching program as tolerated.   PT Home Exercise Plan Single knee to chest    Consulted and Agree with Plan of Care Patient;Family member/caregiver   Family Member Consulted Caregiver from group home.         Problem List There are no active problems to display for this patient.  Kerman Passey, PT, DPT    05/07/2015, 6:01 PM  [place] [facility] [address] [place], [address] Phone: [phone]   Fax:  [phone]  Name: Elizabeth Gutierrez MRN: [id number] Date of Birth: 1960-10-28

## 2015-05-11 ENCOUNTER — Ambulatory Visit: Payer: Medicare Other | Admitting: Physical Therapy

## 2015-05-11 DIAGNOSIS — M5441 Lumbago with sciatica, right side: Secondary | ICD-10-CM | POA: Diagnosis not present

## 2015-05-11 DIAGNOSIS — R262 Difficulty in walking, not elsewhere classified: Secondary | ICD-10-CM

## 2015-05-11 DIAGNOSIS — G8929 Other chronic pain: Secondary | ICD-10-CM

## 2015-05-11 NOTE — Therapy (Signed)
[place] [facility] [address] [place], [address] Phone: [phone]   Fax:  [phone]  Physical Therapy Treatment  Patient Details  Name: Elizabeth Gutierrez MRN: [id number] Date of Birth: Oct 04, 1960 No Data Recorded  Encounter Date: 05/11/2015      PT End of Session - 05/11/15 0859    Visit Number 3   Number of Visits 9   Date for PT Re-Evaluation 06/08/15   PT Start Time 0857   PT Stop Time 0940   PT Time Calculation (min) 43 min   Activity Tolerance Patient tolerated treatment well;Patient limited by pain   Behavior During Therapy [facility] for tasks assessed/performed      Past Medical History  Diagnosis Date  . Diabetes ([place])   . Heart attack ([place])   . Coronary artery disease     Past Surgical History  Procedure Laterality Date  . Foot surgery Right     There were no vitals filed for this visit.  Visit Diagnosis:  Chronic low back pain with right-sided sciatica, unspecified back pain laterality  Difficulty walking      Subjective Assessment - 05/11/15 0901    Subjective Patient reports she has completed her HEP several times over the weekend. She reports some improvement, and increase in pain when she did not complete HEP (single knee to chest).   Patient is accompained by: --  Group home representative    Limitations Standing;Walking   Patient Stated Goals To have less pain    Currently in Pain? Yes   Pain Score 3    Pain Location Hip   Pain Orientation Right   Pain Descriptors / Indicators --  Stinging   Pain Type Chronic pain   Pain Radiating Towards R tibialis anterior and groin regions    Pain Onset More than a month ago   Pain Frequency Intermittent   Aggravating Factors  Worst first thing in the morning, sit to stand transition   Pain Relieving Factors Walking, hot shower head.       Manual Therapy  Long axis distractions grade IV on RLE, patient reports this "stretchhed out all  her muscles" and did not have any complaints of pain during treatment. She began to ambulate with severe difficulty, requiring HHA and demonstrating antalgic gait pattern. Attempted prone CPA at L5, patient began to cry and exclaimed significant pain, which radiated down her RLE into her calf, it appears this reproduced her pain symptoms.  Patient encouraged to perform standing extensions with HHA x 8 (began by stating this felt good, then patient reported increased pain with repetition.) Prone on elbows with soft tissue mobilization and ischemic trigger point release in her gluteal region by greater trochanter, gluteal fold, gluteal origin. Patient reported decreased resting symptoms, however antalgic gait pattern with patient holding on to objects for support noted afterwards.   TherEx  Single knee to chest RLE x 8 for 2 sets with 5" holds. Patient reports feeling good while completing this exercise, then begins to complain of intense pain while sitting up.  Supine piriformis stretching x 30" for 3 bouts with low angle of knee flexion as patient complain of pain in R groin area with leg elevated.    ** Patient walked out of clinic reporting thins "didn't hurt" with no antalgic pattern noted, it appeared resumed to her baseline gait pattern.  PT Education - 05/11/15 1618    Education provided Yes   Education Details Continue to perform HEP as instructed, monitor for symptoms throughout the day.    Person(s) Educated Patient;Caregiver(s)   Methods Explanation;Demonstration   Comprehension Verbalized understanding;Returned demonstration;Need further instruction             PT Long Term Goals - 05/04/15 1136    PT LONG TERM GOAL #1   Title Patient will complete 5 sit to stands with no increase in pain and no use of hands to demonstrate increased tolerance for OOB mobility.    Baseline Pain with sit to stand, requires use of UEs.    Time 4   Period  Weeks   Status New   PT LONG TERM GOAL #2   Title Patient will report an ODI score of less than 20% disability to demonstrate improved tolerance for ADLs.    Baseline 32% without items 7, 8, 9.   Time 4   Period Weeks   Status New   PT LONG TERM GOAL #3   Title Patient will report worst pain of less than 4/10 on VAS scale to demonstrate increased tolerance for ADLs.    Baseline 7/10   Time 4   Period Weeks   Status New   PT LONG TERM GOAL #4   Title Patient will report no pain in the morning for at least 3 consecutive days to demonstrate improved tolerance for sleep to complete ADLs.    Baseline Pain with waking every morning.    Time 4   Period Weeks   Status New               Plan - 05/11/15 1545    Clinical Impression Statement Patient continues to demonstrate inconsistent reports of pain throughout session. There does seem to be a mechanical component to her pain as she became tearful after attempt at L5 joint mobilization. Her gait is particularly inconsistent as she demonstrates significant antalgia (holding on to objects, etc.) throughout the session, then as patient was leaving she demonstrates her baseline gait pattern. Decreased lateral shift noted, though this may require additional treatment in follow up sessions. Increased focus on functional tasks in future    Pt will benefit from skilled therapeutic intervention in order to improve on the following deficits Abnormal gait;Difficulty walking;Obesity;Pain;Postural dysfunction;Decreased strength;Impaired flexibility   Rehab Potential Fair   Clinical Impairments Affecting Rehab Potential Cognitive status   PT Frequency 2x / week   PT Duration 4 weeks   PT Treatment/Interventions Moist Heat;Therapeutic exercise;Therapeutic activities;Balance training;Taping;Patient/family education;Gait training   PT Next Visit Plan Re-assess lateral shift, progress stretching program as tolerated.   PT Home Exercise Plan Single knee  to chest    Consulted and Agree with Plan of Care Patient;Family member/caregiver   Family Member Consulted Caregiver from group home.         Problem List There are no active problems to display for this patient.  Kerman Passey, PT, DPT    05/11/2015, 4:26 PM  [place] [facility] [address], [place], [address] Phone: [phone]   Fax:  [phone]  Name: Elizabeth Gutierrez MRN: [id number] Date of Birth: 1960/10/04

## 2015-05-14 ENCOUNTER — Encounter: Payer: Medicare Other | Admitting: Physical Therapy

## 2015-05-14 ENCOUNTER — Ambulatory Visit: Payer: Medicare Other | Admitting: Physical Therapy

## 2015-05-14 DIAGNOSIS — M5441 Lumbago with sciatica, right side: Principal | ICD-10-CM

## 2015-05-14 DIAGNOSIS — G8929 Other chronic pain: Secondary | ICD-10-CM

## 2015-05-14 DIAGNOSIS — R262 Difficulty in walking, not elsewhere classified: Secondary | ICD-10-CM

## 2015-05-14 NOTE — Therapy (Signed)
[place] [facility] [address] [place], [address] Phone: [phone]   Fax:  [phone]  Physical Therapy Treatment  Patient Details  Name: Elizabeth Gutierrez MRN: [id number] Date of Birth: [birth year] No Data Recorded  Encounter Date: 05/14/2015      PT End of Session - 05/14/15 1110    Visit Number 4   Number of Visits 9   Date for PT Re-Evaluation 06/08/15   PT Start Time 1030   PT Stop Time 1109   PT Time Calculation (min) 39 min   Activity Tolerance Patient tolerated treatment well   Behavior During Therapy [facility] for tasks assessed/performed      Past Medical History  Diagnosis Date  . Diabetes ([place])   . Heart attack ([place])   . Coronary artery disease     Past Surgical History  Procedure Laterality Date  . Foot surgery Right     There were no vitals filed for this visit.  Visit Diagnosis:  Chronic low back pain with right-sided sciatica, unspecified back pain laterality  Difficulty walking      Subjective Assessment - 05/14/15 1053    Subjective Patient reports she has done her home exercises, she still has pain in the mornings. She ambulates with no obvious antalgia or pain/delay with sit to stand.    Limitations Standing;Walking   Patient Stated Goals To have less pain    Currently in Pain? Yes  Patient reports she still has morning pain, no limping in this session      Soccer game in hallway with goals, asking patient to complete squats to get ball, walking throughout the gym, single leg stance to kick the ball x 10 minutes for strengthening, balance, assessment of pain avoidance behaviors. No avoidance of single leg stance, squatting, kicking, etc.   Single leg stance x 1 minute bilaterally with 1-2" tops for SLS bilaterally as patient lost balance laterally (it appears due to poor effort)   Sit to stand with 5# DB (challenging not painful) for 2 sets x 5 repetitions.   Kicking ball in  abduction x 10 bilaterally for SLS and hip abductor strengthening   Ball touching on blue foam pad x 10 with intermittent use of hands   Ball passing drill to promote SLS (difficult for patient to follow instructions)   ** Patient reported no pain throughout session and fully participated throughout session.                         PT Education - 05/14/15 1114    Education provided Yes   Education Details Educated patient to attempt sit to stand with book or a weight at home and improve single leg stance.    Person(s) Educated Patient;Caregiver(s)   Methods Explanation;Demonstration   Comprehension Verbalized understanding;Returned demonstration;Verbal cues required;Tactile cues required;Need further instruction             PT Long Term Goals - 05/04/15 1136    PT LONG TERM GOAL #1   Title Patient will complete 5 sit to stands with no increase in pain and no use of hands to demonstrate increased tolerance for OOB mobility.    Baseline Pain with sit to stand, requires use of UEs.    Time 4   Period Weeks   Status New   PT LONG TERM GOAL #2   Title Patient will report an ODI score of less than 20% disability to demonstrate  improved tolerance for ADLs.    Baseline 32% without items 7, 8, 9.   Time 4   Period Weeks   Status New   PT LONG TERM GOAL #3   Title Patient will report worst pain of less than 4/10 on VAS scale to demonstrate increased tolerance for ADLs.    Baseline 7/10   Time 4   Period Weeks   Status New   PT LONG TERM GOAL #4   Title Patient will report no pain in the morning for at least 3 consecutive days to demonstrate improved tolerance for sleep to complete ADLs.    Baseline Pain with waking every morning.    Time 4   Period Weeks   Status New               Plan - 05/14/15 1111    Clinical Impression Statement Patient demonstrates significantly improved gait pattern with minimal if any antalgia noted, ability to complete  squats to pick up objects, and tolerance for single leg stance bilaterally in this session. Minimial if any reports of pain throughout the session. It appears patient is returning to her baseline level of mobility and function, would recommend perhaps 2 additional visits to track progress and provide strengthening activities for her R hip prior to discharge.    Pt will benefit from skilled therapeutic intervention in order to improve on the following deficits Abnormal gait;Difficulty walking;Obesity;Pain;Postural dysfunction;Decreased strength;Impaired flexibility   Rehab Potential Fair   Clinical Impairments Affecting Rehab Potential Cognitive status   PT Frequency 2x / week   PT Duration 4 weeks   PT Treatment/Interventions Moist Heat;Therapeutic exercise;Therapeutic activities;Balance training;Taping;Patient/family education;Gait training   PT Next Visit Plan Re-assess lateral shift, progress stretching program as tolerated.   PT Home Exercise Plan Single knee to chest    Consulted and Agree with Plan of Care Patient;Family member/caregiver   Family Member Consulted Caregiver from group home.         Problem List There are no active problems to display for this patient.  Kerman Passey, PT, DPT    05/14/2015, 2:10 PM  [place] [address], [place], [address] Phone: [phone]   Fax:  [phone]  Name: Elizabeth Gutierrez MRN: [id number] Date of Birth: 1960-07-14

## 2015-05-18 ENCOUNTER — Ambulatory Visit: Payer: Medicare Other | Admitting: Physical Therapy

## 2015-05-18 DIAGNOSIS — M5441 Lumbago with sciatica, right side: Principal | ICD-10-CM

## 2015-05-18 DIAGNOSIS — R262 Difficulty in walking, not elsewhere classified: Secondary | ICD-10-CM

## 2015-05-18 DIAGNOSIS — G8929 Other chronic pain: Secondary | ICD-10-CM

## 2015-05-18 NOTE — Therapy (Signed)
[facility] [address], [place], [address] Phone: [phone]   Fax:  [phone]  Physical Therapy Treatment  Patient Details  Name: Elizabeth Gutierrez MRN: [id number] Date of Birth: 10-14-1960 No Data Recorded  Encounter Date: 05/18/2015      PT End of Session - 05/18/15 0946    Visit Number 5   Number of Visits 9   Date for PT Re-Evaluation 06/08/15   PT Start Time 0855   PT Stop Time 0940   PT Time Calculation (min) 45 min   Equipment Utilized During Treatment Gait belt   Activity Tolerance Patient tolerated treatment well   Behavior During Therapy [facility] for tasks assessed/performed      Past Medical History  Diagnosis Date  . Diabetes ([place])   . Heart attack ([place])   . Coronary artery disease     Past Surgical History  Procedure Laterality Date  . Foot surgery Right     There were no vitals filed for this visit.  Visit Diagnosis:  Chronic low back pain with right-sided sciatica, unspecified back pain laterality  Difficulty walking      Subjective Assessment - 05/18/15 0903    Subjective Patient reports she had a good weekend, she walked around [employer] with "no pain" over the weekend. She reports she has pain only in the mornings and thinks it is from her bed.    Patient is accompained by: --  Group home representative    Limitations Standing;Walking   Patient Stated Goals To have less pain    Currently in Pain? --  Mild antalgia initially with standing, no additional pain observed as normal gait pattern for her displayed after-wards.      Single leg stance on blue foam pad, toe tapping ball x 5 bilaterally with no HHA.   Toe tapping to cones x 8 with ability to complete without pain, loss of balance.   Passing drills with soccer ball to increase single leg stance, agility, assessing for tolerance of single leg weight bearing on RLE. No avoidance noted, able to pass with PT  bilaterally with no increase in symptoms reported.   Soccer game in hallway x 5 minutes assessing for patient's speed with ambulation, dynamic balance, command following. No avoidance demonstrated until PT asked patient to get ball from precarious position under one of the gym machines, patient reported hip pain. She did not complain of pain again for remainder of "game".  32m walk time 7.6 seconds with no AD, with cuing to complete quickly, minimal if any antalgia noted. Mild knee flexion bilaterally, appears to be her baseline based off of last several sessions. Multiple bouts completed for both hip extensor strengthening and observation of avoidance behaviors. Pain on one bout, otherwise no complaints noted.   Stair ascent with bilateral HHA, unilateral, no HHA. Able to complete ascent in each condition with no pain, no loss of balance. Requires step to approach for descent, mild loss of balance without use of hands, though again no pain reported.   Seated HS stretch, piriformis stretching. X 10 for 5" holds with PT assistance for both condition, provided seated piriformis stretching for HEP. No complaints of pain after stretching completed.                            PT Education - 05/18/15 0946    Education provided Yes   Education Details Educated patient  on progress, piriformis stretch   Person(s) Educated Patient;Caregiver(s)   Methods Explanation;Demonstration   Comprehension Verbalized understanding;Returned demonstration;Need further instruction             PT Long Term Goals - 05/18/15 0948    PT LONG TERM GOAL #1   Title Patient will complete 5 sit to stands with no increase in pain and no use of hands to demonstrate increased tolerance for OOB mobility.    Baseline Pain with sit to stand, requires use of UEs.    Time 4   Period Weeks   Status Achieved   PT LONG TERM GOAL #2   Title Patient will report an ODI score of less than 20% disability to  demonstrate improved tolerance for ADLs.    Baseline 32% without items 7, 8, 9.   Time 4   Period Weeks   Status On-going   PT LONG TERM GOAL #3   Title Patient will report worst pain of less than 4/10 on VAS scale to demonstrate increased tolerance for ADLs.    Baseline 7/10   Time 4   Period Weeks   Status On-going   PT LONG TERM GOAL #4   Title Patient will report no pain in the morning for at least 3 consecutive days to demonstrate improved tolerance for sleep to complete ADLs.    Baseline Pain with waking every morning.    Time 4   Period Weeks   Status On-going               Plan - 05/18/15 0947    Clinical Impression Statement Patient displays no gait alterations in this session, she does demonstrate significant LE weakness with prolonged time to complete 5x sit to stand without use of UEs. She continues to complain of pain, however she seems to attribute it to her bed, in this session she displayed pain symptoms 2-3x, these clustered around times when patient did not want to complete a task (such as squatting to pick up a ball). Patient appears to be progressing well towards her mobility goals and would benefit from an HEP she can complete to prepare for discharge.    Pt will benefit from skilled therapeutic intervention in order to improve on the following deficits Abnormal gait;Difficulty walking;Obesity;Pain;Postural dysfunction;Decreased strength;Impaired flexibility   Rehab Potential Fair   Clinical Impairments Affecting Rehab Potential Cognitive status   PT Frequency 2x / week   PT Duration 4 weeks   PT Treatment/Interventions Moist Heat;Therapeutic exercise;Therapeutic activities;Balance training;Taping;Patient/family education;Gait training   PT Next Visit Plan Continue unilateral [place] activities, dynamic activities for LE strengthening.    PT Home Exercise Plan Seated piriformis stretch    Consulted and Agree with Plan of Care Patient;Family member/caregiver    Family Member Consulted Caregiver from group home.         Problem List There are no active problems to display for this patient.   Kerman Passey, PT, DPT    05/18/2015, 5:51 PM  [facility] [address] [place], [address] Phone: [phone]   Fax:  [phone]  Name: PELIN ODOHERTY MRN: [id number] Date of Birth: [birth year]

## 2015-05-21 ENCOUNTER — Ambulatory Visit
Admission: RE | Admit: 2015-05-21 | Discharge: 2015-05-21 | Disposition: A | Discharge status: Home / Self Care | Payer: Medicare Other | Source: Ambulatory Visit | Attending: Family Medicine | Admitting: Family Medicine

## 2015-05-21 ENCOUNTER — Ambulatory Visit: Payer: Medicare Other | Attending: Orthopedic Surgery | Admitting: Physical Therapy

## 2015-05-21 DIAGNOSIS — R262 Difficulty in walking, not elsewhere classified: Secondary | ICD-10-CM | POA: Insufficient documentation

## 2015-05-21 DIAGNOSIS — M545 Low back pain: Secondary | ICD-10-CM

## 2015-05-21 DIAGNOSIS — G8929 Other chronic pain: Secondary | ICD-10-CM | POA: Insufficient documentation

## 2015-05-21 DIAGNOSIS — M5416 Radiculopathy, lumbar region: Secondary | ICD-10-CM | POA: Insufficient documentation

## 2015-05-21 DIAGNOSIS — M5441 Lumbago with sciatica, right side: Secondary | ICD-10-CM | POA: Diagnosis present

## 2015-05-21 DIAGNOSIS — M4806 Spinal stenosis, lumbar region: Secondary | ICD-10-CM | POA: Diagnosis not present

## 2015-05-21 NOTE — Therapy (Signed)
[facility] [facility] [address], [place], [address] Phone: [phone]   Fax:  [phone]  Physical Therapy Treatment  Patient Details  Name: Elizabeth Gutierrez MRN: [id number] Date of Birth: Jul 24, 1960 No Data Recorded  Encounter Date: 05/21/2015      PT End of Session - 05/21/15 0948    Visit Number 6   Number of Visits 9   Date for PT Re-Evaluation 06/08/15   PT Start Time 0854   PT Stop Time 0937   PT Time Calculation (min) 43 min   Activity Tolerance Patient tolerated treatment well   Behavior During Therapy [facility] for tasks assessed/performed      Past Medical History  Diagnosis Date  . Diabetes ([place])   . Heart attack ([place])   . Coronary artery disease     Past Surgical History  Procedure Laterality Date  . Foot surgery Right     There were no vitals filed for this visit.  Visit Diagnosis:  Chronic low back pain with right-sided sciatica, unspecified back pain laterality  Difficulty walking      Subjective Assessment - 05/21/15 0904    Subjective Patient reports she will be getting an MRI of her lumbar spine today, she is hoping to get an injection "to make the pain go away". She reports trying some of the exercises at home. Of note, she displays decreased antalgia and minimal if any lateral shift in gait.    Patient is accompained by: --  Group-home representative   Limitations Standing;Walking   Patient Stated Goals To have less pain    Currently in Pain? --  Pain occurs just in the morning, she reports she has little to no pain while walking in today   Pain Radiating Towards R hip, radiating to R TA.        mODI - 26% (PT had to interpret many of her answers as she is cognitively impaired, unable to answer many of the questions without assistance.   Pain in R hip with "fast walking" cue, dissipates quickly with decreased gait speed.   Self single knee to chest x 5 for 2 sets no reports of  pain during treatment  PT directed manual stretching of piriformis 2 bouts x 30" with 3rd bout of 30" directed by patient with PT assistance, patient reports some "muscle stretching" pain laterally and pain in groin during treatment. She reports increased pain with standing and ambulating.   Able to complete standing marching, hip abductions., hip extensions x 5 bilaterally with LUE HHA, no reports of pain   Prone on elbows x 5 repetitions of 10" holds, reports mild stinging. Again with standing patient reports increased symptoms, however as she leaves clinic no antalgia to gait, no deficits noted in sit to stand.   Patient is able to dribble and pass soccer ball x 5 minutes bilaterally with very intermittent reports of pain, no pattern (kicking with R or L, dribbling etc) of consistent reproduction.                           PT Education - 05/21/15 1051    Education provided Yes   Education Details Instructed patient in single knee to chest, progress with therapy, discharge from PT.    Person(s) Educated Patient;Caregiver(s)   Methods Explanation;Demonstration;Handout;Verbal cues   Comprehension Verbalized understanding;Returned demonstration;Need further instruction  PT Long Term Goals - 2015-06-10 0949    PT LONG TERM GOAL #1   Title Patient will complete 5 sit to stands with no increase in pain and no use of hands to demonstrate increased tolerance for OOB mobility.    Baseline Pain with sit to stand, requires use of UEs.    Time 4   Period Weeks   Status Achieved   PT LONG TERM GOAL #2   Title Patient will report an ODI score of less than 20% disability to demonstrate improved tolerance for ADLs.    Baseline 32% without items 7, 8, 9. --> 26% on 10-Jun-2015 (with significant assistance from PT)    Time 4   Period Weeks   Status Partially Met   PT LONG TERM GOAL #3   Title Patient will report worst pain of less than 4/10 on VAS scale to  demonstrate increased tolerance for ADLs.    Baseline 7/10 at [place], now reports 9/10 pain in the morning then no pain during the day   Time 4   Period Weeks   Status Not Met   PT LONG TERM GOAL #4   Title Patient will report no pain in the morning for at least 3 consecutive days to demonstrate improved tolerance for sleep to complete ADLs.    Baseline Pain with waking every morning.    Time 4   Period Weeks   Status Not Met               Plan - June 10, 2015 0948    Clinical Impression Statement Patient continues to report intermittent periods of pain during session, and seems to complain of more pain after stretching and lying in prone. She does not display anywhere near the same level of antalgia during gait or sit to stand as first few sessions, though she reports increased pain  with increased gait speed. At this time, she appears to have improved a good deal with therapy, but may have reached her maximal level  of relief and function and appears appropriate for discharge given her stable levels of discomfort.    Pt will benefit from skilled therapeutic intervention in order to improve on the following deficits Abnormal gait;Difficulty walking;Obesity;Pain;Postural dysfunction;Decreased strength;Impaired flexibility   Rehab Potential Fair   Clinical Impairments Affecting Rehab Potential Cognitive status   PT Frequency 2x / week   PT Duration 4 weeks   PT Treatment/Interventions Moist Heat;Therapeutic exercise;Therapeutic activities;Balance training;Taping;Patient/family education;Gait training   PT Next Visit Plan Discharged on this visit    PT Home Exercise Plan See patient instructions.    Consulted and Agree with Plan of Care Patient;Family member/caregiver   Family Member Consulted Caregiver from group home.           G-Codes - 2015/06/10 1058    Functional Assessment Tool Used Modified ODI   Functional Limitation Mobility: Walking and moving around   Mobility: Walking  and Moving Around Current Status [phone]) At least 20 percent but less than 40 percent impaired, limited or restricted   Mobility: Walking and Moving Around Discharge Status [phone]) At least 20 percent but less than 40 percent impaired, limited or restricted      Problem List There are no active problems to display for this patient.  Kerin Ransom, PT, DPT    06-10-2015, 10:58 AM  [facility] [address] [place], [address] Phone: [phone]   Fax:  [phone]  Name: Elizabeth Weis  Gutierrez MRN: [id number] Date of Birth: June 06, 1960

## 2015-06-16 ENCOUNTER — Ambulatory Visit (INDEPENDENT_AMBULATORY_CARE_PROVIDER_SITE_OTHER): Payer: Medicare Other | Admitting: Sports Medicine

## 2015-06-16 ENCOUNTER — Encounter: Payer: Self-pay | Admitting: Sports Medicine

## 2015-06-16 DIAGNOSIS — E119 Type 2 diabetes mellitus without complications: Secondary | ICD-10-CM | POA: Insufficient documentation

## 2015-06-16 DIAGNOSIS — B351 Tinea unguium: Secondary | ICD-10-CM | POA: Diagnosis not present

## 2015-06-16 DIAGNOSIS — I2510 Atherosclerotic heart disease of native coronary artery without angina pectoris: Secondary | ICD-10-CM | POA: Insufficient documentation

## 2015-06-16 DIAGNOSIS — R600 Localized edema: Secondary | ICD-10-CM

## 2015-06-16 DIAGNOSIS — E1149 Type 2 diabetes mellitus with other diabetic neurological complication: Secondary | ICD-10-CM | POA: Diagnosis not present

## 2015-06-16 DIAGNOSIS — M79676 Pain in unspecified toe(s): Secondary | ICD-10-CM

## 2015-06-16 DIAGNOSIS — I10 Essential (primary) hypertension: Secondary | ICD-10-CM | POA: Insufficient documentation

## 2015-06-16 NOTE — Progress Notes (Signed)
Patient ID: KIRSTIN HOUSEN, female   DOB: 1960-05-14, 55 y.o.   MRN: [id number] Subjective: Elizabeth Gutierrez is a 55 y.o. female patient with history of type 2 diabetes who presents to office today complaining of long, painful nails  while ambulating in shoes; unable to trim. Patient states that the glucose reading this morning was good; around 140mg /dl. Patient denies any new changes in medication or new problems. Patient denies any new cramping, numbness, burning or tingling in the legs.  Patient Active Problem List   Diagnosis Date Noted  . CAD in native artery 06/16/2015  . Controlled type 2 diabetes mellitus without complication ([place]) 0000000  . Benign essential HTN 06/16/2015  . Obstructive apnea 01/05/2015  . Esophagitis, reflux 08/06/2014  . Edema leg 05/14/2014  . Chronic systolic heart failure ([place]) 05/14/2014  . TI (tricuspid incompetence) 05/14/2014  . Chest pain 12/13/2013  . Combined fat and carbohydrate induced hyperlipemia 10/09/2013  . Breath shortness 10/09/2013   Current Outpatient Prescriptions on File Prior to Visit  Medication Sig Dispense Refill  . acetaminophen (TYLENOL) 325 MG tablet Take 325 mg by mouth every 4 (four) hours as needed. 2 TABLETS BY MOUTH EVERY FOUR HOURS AS NEEDED    . ARIPiprazole (ABILIFY) 30 MG tablet Take 30 mg by mouth daily.    Marland Kitchen aspirin 81 MG tablet Take 81 mg by mouth daily.    Marland Kitchen atorvastatin (LIPITOR) 20 MG tablet Take 20 mg by mouth every evening.    . benazepril (LOTENSIN) 5 MG tablet Take 5 mg by mouth daily.    . benztropine (COGENTIN) 0.5 MG tablet Take 0.5 mg by mouth 2 (two) times daily.    . calcium carbonate (TUMS - DOSED IN MG ELEMENTAL CALCIUM) 500 MG chewable tablet Chew 1 tablet by mouth every 4 (four) hours as needed for indigestion or heartburn.    . carvedilol (COREG) 3.125 MG tablet Take 3.125 mg by mouth 2 (two) times daily with a meal.    . docusate sodium (COLACE) 100 MG capsule Take 100 mg by mouth 2 (two) times daily.     Marland Kitchen FLUPHENAZINE DECANOATE IJ Inject 25 mg as directed. INJECT INTO THE MUSCLE EVERY TWO WEEKS    . furosemide (LASIX) 20 MG tablet Take 20 mg by mouth daily.    Marland Kitchen glucose blood (TRUETRACK TEST) test strip 1 each by Other route as needed for other. Use as instructed    . guaifenesin (ROBITUSSIN) 100 MG/5ML syrup Take 200 mg by mouth every 6 (six) hours as needed for cough.    . Lancets MISC by Does not apply route.    Marland Kitchen levothyroxine (SYNTHROID, LEVOTHROID) 50 MCG tablet Take 50 mcg by mouth daily before breakfast.    . LORazepam (ATIVAN) 0.5 MG tablet Take 0.5 mg by mouth 2 (two) times daily.    . meloxicam (MOBIC) 7.5 MG tablet Take 7.5 mg by mouth daily.    . metFORMIN (GLUCOPHAGE) 850 MG tablet Take 850 mg by mouth 2 (two) times daily with a meal.    . NITROGLYCERIN SL Place 0.4 mg under the tongue. 1 SL EVERY 5 MIN AS NEEDED FOR CHEST PAIN    . omeprazole (PRILOSEC) 40 MG capsule Take 40 mg by mouth daily.    . polyethylene glycol powder (MIRALAX) powder Take 1 Container by mouth once. 17 G OF POWDER , 1 HEAPING TABLESPOON DISSOLVED IN 8 OZ OF WATER , ONCE DAILY 3 DAYS PER WEEK    . senna (SENOKOT) 8.6  MG tablet Take 1 tablet by mouth daily. 1-2 BY MOUTH AT BEDTIME    . sertraline (ZOLOFT) 100 MG tablet Take 100 mg by mouth daily.    Marland Kitchen sulfamethoxazole-trimethoprim (BACTRIM DS,SEPTRA DS) 800-160 MG per tablet Take 1 tablet by mouth 2 (two) times daily. 20 tablet 0   No current facility-administered medications on file prior to visit.   No Known Allergies   Objective: General: Patient is awake, alert, and oriented x 3 and in no acute distress.  Integument: Skin is warm, dry and supple bilateral. Nails are tender, long, thickened and  dystrophic with subungual debris, consistent with onychomycosis, 1-5 bilateral. No signs of infection. No open lesions or preulcerative lesions present bilateral, history of previous callus at medial hallux ipj on left. Remaining integument  unremarkable.  Vasculature:  Dorsalis Pedis pulse 2/4 bilateral. Posterior Tibial pulse  1/4 bilateral.  Capillary fill time <3 sec 1-5 bilateral. Scant hair growth to the level of the digits. Temperature gradient within normal limits. Mild varicosities present bilateral. Trace edema present bilateral.   Neurology: The patient has diminished sensation measured with a 5.07/10g Semmes Weinstein Monofilament at all pedal sites bilateral . Vibratory sensation diminished bilateral with tuning fork. No Babinski sign present bilateral.   Musculoskeletal: Asymptomatic planus and hammertoe gross pedal deformities noted bilateral. Muscular strength 5/5 in all lower extremity muscular groups bilateral without pain or limitation on range of motion . No tenderness with calf compression bilateral.  Assessment and Plan: Problem List Items Addressed This Visit    None    Visit Diagnoses    Dermatophytosis of nail    -  Primary    Pain of toe, unspecified laterality        Type II diabetes mellitus with neurological manifestations (H[facility])        Localized edema          -Examined patient. -Discussed and educated patient on diabetic foot care, especially with  regards to the vascular, neurological and musculoskeletal systems.  -Stressed the importance of good glycemic control and the detriment of not  controlling glucose levels in relation to the foot. -Mechanically debrided all nails 1-5 bilateral using sterile nail nipper and filed with dremel without incident  -Safe step diabetic shoe order form was completed; office to contact primary care for approval / certification;  Office to arrange shoe fitting and dispensing. -Recommend ice and elevation as needed for edema control -Recommend good supportive shoes daily for foot type -Answered all patient questions -Patient to return in 3 months for at risk foot care -Patient advised to call the office if any problems or questions arise in the  meantime.  Landis Martins, DPM

## 2015-07-09 ENCOUNTER — Ambulatory Visit: Payer: Medicare Other | Attending: Pain Medicine | Admitting: Pain Medicine

## 2015-07-09 ENCOUNTER — Encounter: Payer: Self-pay | Admitting: Pain Medicine

## 2015-07-09 VITALS — BP 123/86 | HR 69 | Temp 98.6°F | Resp 18 | Ht 68.0 in | Wt 209.0 lb

## 2015-07-09 DIAGNOSIS — M4806 Spinal stenosis, lumbar region: Secondary | ICD-10-CM | POA: Diagnosis not present

## 2015-07-09 DIAGNOSIS — M48062 Spinal stenosis, lumbar region with neurogenic claudication: Secondary | ICD-10-CM | POA: Insufficient documentation

## 2015-07-09 DIAGNOSIS — M51369 Other intervertebral disc degeneration, lumbar region without mention of lumbar back pain or lower extremity pain: Secondary | ICD-10-CM | POA: Insufficient documentation

## 2015-07-09 DIAGNOSIS — M1288 Other specific arthropathies, not elsewhere classified, other specified site: Secondary | ICD-10-CM | POA: Diagnosis not present

## 2015-07-09 DIAGNOSIS — M5126 Other intervertebral disc displacement, lumbar region: Secondary | ICD-10-CM | POA: Diagnosis not present

## 2015-07-09 DIAGNOSIS — M533 Sacrococcygeal disorders, not elsewhere classified: Secondary | ICD-10-CM | POA: Insufficient documentation

## 2015-07-09 DIAGNOSIS — M47896 Other spondylosis, lumbar region: Secondary | ICD-10-CM | POA: Diagnosis not present

## 2015-07-09 DIAGNOSIS — M545 Low back pain: Secondary | ICD-10-CM | POA: Diagnosis present

## 2015-07-09 DIAGNOSIS — E0940 Drug or chemical induced diabetes mellitus with neurological complications with diabetic neuropathy, unspecified: Secondary | ICD-10-CM

## 2015-07-09 DIAGNOSIS — M5136 Other intervertebral disc degeneration, lumbar region: Secondary | ICD-10-CM | POA: Diagnosis not present

## 2015-07-09 DIAGNOSIS — E1140 Type 2 diabetes mellitus with diabetic neuropathy, unspecified: Secondary | ICD-10-CM | POA: Insufficient documentation

## 2015-07-09 DIAGNOSIS — M47816 Spondylosis without myelopathy or radiculopathy, lumbar region: Secondary | ICD-10-CM

## 2015-07-09 DIAGNOSIS — M79606 Pain in leg, unspecified: Secondary | ICD-10-CM | POA: Diagnosis present

## 2015-07-09 DIAGNOSIS — M5416 Radiculopathy, lumbar region: Secondary | ICD-10-CM

## 2015-07-09 NOTE — Progress Notes (Signed)
Safety precautions to be maintained throughout the outpatient stay will include: orient to surroundings, keep bed in low position, maintain call bell within reach at all times, provide assistance with transfer out of bed and ambulation.  

## 2015-07-09 NOTE — Progress Notes (Signed)
Subjective:    Patient ID: Elizabeth Gutierrez, female    DOB: September 21, 1960, 55 y.o.   MRN: [id number]  HPI  The patient is a 55 year old female who comes to pain management Center at request of Dr. Posey Pronto for further evaluation and treatment of pain involving the lumbar and lower extremity region. The patient is with insidious onset of pain approximately 1 year ago. The patient denies any trauma change in events of daily living that may have caused lower back and lower extremity pain. The patient states the pain is more intense as the day progresses and is increased with standing walking twisting turning maneuvers. The patient described her pain as dull sharp tingling sensation lower back radiating to the lower extremity region. The pain increases sitting standing walking and decreased with hot packs and medications. The patient also admits to some relief of pain with warm showers and baths. We will proceed with interventional treatment at time of return appointment consisting of lumbar facet, medial branch nerve, blocks. All agreed to suggested treatment plan.     Review of Systems    Cardiovascular: Status post myocardial infarction  Pulmonary: Unremarkable  Neurological: Unremarkable  Psychological: Anxiety  Gastrointestinal: Gastroesophageal reflux disease  Genitourinary: Unremarkable  Hematologic: Unremarkable  Endocrine: Diabetes mellitus  Rheumatological: Unremarkable  Musculoskeletal: Unremarkable  Other significant: Unremarkable     Objective:   Physical Exam   There was tenderness to palpation of paraspinal musculature region cervical region cervical facet region of minimal degree with minimal tenderness of the splenius capitis and occipitalis region. Palpation over the cervical facet cervical paraspinal musculature region reproduced pain of minimal degree. There was tenderness of the thoracic facet thoracic paraspinal musculature region with no crepitus of the  thoracic region noted. The patient appeared to be with bilaterally equal grip strength and Tinel and Phalen's maneuver were without increased pain of significant degree palpation over the thoracic region thoracic facet region was with no crepitus of the thoracic region. Palpation over the lumbar paraspinal must reason lumbar facet region was of increased pain with lateral bending rotation extension and palpation of the lumbar facets reproducing moderate to moderately severe discomfort. There was straight leg raising tolerates approximately 20 with questionable increased pain with dorsiflexion noted. DTRs were difficult to elicit appeared to be trace at the knees. There was negative clonus negative Homans. Palpation over the PSIS and PII S regions reproduced pain of moderate to moderately severe degree. Palpation of the greater trochanteric region iliotibial band region reproduced pain of mild degree. There was negative clonus negative Homans. Knees were tenderness to palpation with crepitus of the knees noted with negative anterior and posterior drawer signs with no ballottement of the patella noted no increased warmth erythema of the knees noted. Abdomen nontender with no costovertebral tenderness noted     Assessment & Plan:   Degenerative disc disease lumbar spine  Vertebral body height and signal are maintained. There is exaggeration of the normal lumbar lordosis. Facet degenerative disease results in trace anterolisthesis L5 on S1. Alignment is otherwise unremarkable. The conus medullaris is normal in signal and position. Imaged intra-abdominal contents demonstrate punctate T2 hyperintensities in the kidneys likely reflecting cysts. The urinary bladder is distended.  T11-12 is imaged in the sagittal plane only and negative.  T12-L1: Negative.  L1-2: Negative.  L2-3: Negative.  L3-4: Mild facet degenerative change. Otherwise negative.  L4-5: There is right worse than left  facet arthropathy with a right facet joint effusion and mild marrow edema in  the right L4-5 pedicles consistent with stress change related to facet degeneration. Shallow disc bulge to the right and ligamentum flavum thickening are seen. Facet arthropathy and disc result in marked narrowing in the right lateral recess and impingement on the descending right L5 root. There is moderate central canal stenosis overall at this level. Foramina are open.  L5-S1: Bilateral facet degenerative change present. The disc is uncovered with a minimal bulge. The central canal and foramina are open.  IMPRESSION: Dominant findings are at L4-5 where there is marked narrowing in the right lateral recess resulting in encroachment on the descending right L5 root. Moderate central canal stenosis is present overall at this level.  Lumbar facet syndrome  Lumbar radiculopathy  Diabetic neuropathy  Sacroiliac joint dysfunction     PLAN  Continue present medications  F/U PCP Dr. Posey Pronto for evaliation of  BP lower extremity swelling diabetes mellitus and general medical  Condition . Ask the secretary what day you can see Dr. Posey Pronto for evaluation of your blood pressure diabetes mellitus and lower extremity swelling  F/U surgical evaluation. Ask the secretary and nurses the date of your neurosurgical evaluation of lower back and lower extremity pain  F/U neurological evaluation. May consider pending follow-up evaluations  May consider radiofrequency rhizolysis or intraspinal procedures pending response to present treatment and F/U evaluation   Patient to call Pain Management Center should patient have concerns prior to scheduled return appointment.

## 2015-07-09 NOTE — Patient Instructions (Addendum)
PLAN  Continue present medications  F/U PCP Dr. Posey Pronto for evaliation of  BP lower extremity swelling diabetes mellitus and general medical  Condition . Ask the secretary what day you can see Dr. Posey Pronto for evaluation of your blood pressure diabetes mellitus and lower extremity swelling  F/U surgical evaluation. Ask the secretary and nurses the date of your neurosurgical evaluation of lower back and lower extremity pain  F/U neurological evaluation. May consider pending follow-up evaluations  May consider radiofrequency rhizolysis or intraspinal procedures pending response to present treatment and F/U evaluation   Patient to call Pain Management Center should patient have concerns prior to scheduled return appointment.

## 2015-07-14 ENCOUNTER — Other Ambulatory Visit: Payer: Self-pay | Admitting: Family Medicine

## 2015-07-14 ENCOUNTER — Ambulatory Visit
Admission: RE | Admit: 2015-07-14 | Discharge: 2015-07-14 | Disposition: A | Discharge status: Home / Self Care | Payer: Medicare Other | Source: Ambulatory Visit | Attending: Family Medicine | Admitting: Family Medicine

## 2015-07-14 DIAGNOSIS — R600 Localized edema: Secondary | ICD-10-CM | POA: Diagnosis not present

## 2015-07-14 DIAGNOSIS — M79604 Pain in right leg: Secondary | ICD-10-CM | POA: Diagnosis not present

## 2015-07-16 LAB — TOXASSURE SELECT 13 (MW), URINE: PDF: 0

## 2015-07-16 NOTE — Progress Notes (Signed)
Quick Note:  Reviewed. ______ 

## 2015-07-23 ENCOUNTER — Ambulatory Visit: Payer: Medicare Other | Admitting: Pain Medicine

## 2015-09-01 ENCOUNTER — Emergency Department
Admission: EM | Admit: 2015-09-01 | Discharge: 2015-09-01 | Disposition: A | Discharge status: Home / Self Care | Payer: Medicare Other | Attending: Emergency Medicine | Admitting: Emergency Medicine

## 2015-09-01 DIAGNOSIS — Z79899 Other long term (current) drug therapy: Secondary | ICD-10-CM | POA: Insufficient documentation

## 2015-09-01 DIAGNOSIS — E119 Type 2 diabetes mellitus without complications: Secondary | ICD-10-CM | POA: Insufficient documentation

## 2015-09-01 DIAGNOSIS — I959 Hypotension, unspecified: Secondary | ICD-10-CM | POA: Diagnosis not present

## 2015-09-01 DIAGNOSIS — Z7984 Long term (current) use of oral hypoglycemic drugs: Secondary | ICD-10-CM | POA: Insufficient documentation

## 2015-09-01 DIAGNOSIS — Z87891 Personal history of nicotine dependence: Secondary | ICD-10-CM | POA: Diagnosis not present

## 2015-09-01 DIAGNOSIS — I2510 Atherosclerotic heart disease of native coronary artery without angina pectoris: Secondary | ICD-10-CM | POA: Insufficient documentation

## 2015-09-01 DIAGNOSIS — I509 Heart failure, unspecified: Secondary | ICD-10-CM | POA: Insufficient documentation

## 2015-09-01 DIAGNOSIS — N390 Urinary tract infection, site not specified: Secondary | ICD-10-CM | POA: Diagnosis not present

## 2015-09-01 DIAGNOSIS — Z7982 Long term (current) use of aspirin: Secondary | ICD-10-CM | POA: Diagnosis not present

## 2015-09-01 DIAGNOSIS — R2241 Localized swelling, mass and lump, right lower limb: Secondary | ICD-10-CM | POA: Diagnosis present

## 2015-09-01 HISTORY — DX: Heart failure, unspecified: I50.9

## 2015-09-01 LAB — URINALYSIS COMPLETE WITH MICROSCOPIC (ARMC ONLY)
Bilirubin Urine: NEGATIVE
GLUCOSE, UA: NEGATIVE mg/dL
Ketones, ur: NEGATIVE mg/dL
Nitrite: NEGATIVE
PROTEIN: 30 mg/dL — AB
SQUAMOUS EPITHELIAL / LPF: NONE SEEN
Specific Gravity, Urine: 1.012 (ref 1.005–1.030)
pH: 5 (ref 5.0–8.0)

## 2015-09-01 LAB — CBC
HCT: 32.2 % — ABNORMAL LOW (ref 35.0–47.0)
Hemoglobin: 10.1 g/dL — ABNORMAL LOW (ref 12.0–16.0)
MCH: 26.3 pg (ref 26.0–34.0)
MCHC: 31.4 g/dL — ABNORMAL LOW (ref 32.0–36.0)
MCV: 83.7 fL (ref 80.0–100.0)
PLATELETS: 466 10*3/uL — AB (ref 150–440)
RBC: 3.85 MIL/uL (ref 3.80–5.20)
RDW: 15.2 % — AB (ref 11.5–14.5)
WBC: 10.8 10*3/uL (ref 3.6–11.0)

## 2015-09-01 LAB — BASIC METABOLIC PANEL
ANION GAP: 5 (ref 5–15)
BUN: 25 mg/dL — AB (ref 6–20)
CALCIUM: 10.2 mg/dL (ref 8.9–10.3)
CO2: 24 mmol/L (ref 22–32)
Chloride: 108 mmol/L (ref 101–111)
Creatinine, Ser: 1.12 mg/dL — ABNORMAL HIGH (ref 0.44–1.00)
GFR calc Af Amer: >60 mL/min (ref >60)
GFR, EST NON AFRICAN AMERICAN: 54 mL/min — AB (ref >60)
GLUCOSE: 87 mg/dL (ref 65–99)
POTASSIUM: 4.9 mmol/L (ref 3.5–5.1)
SODIUM: 137 mmol/L (ref 135–145)

## 2015-09-01 MED ORDER — DEXTROSE 5 % IV SOLN: 1.0000 g | Freq: Once | INTRAVENOUS | Status: DC

## 2015-09-01 MED ORDER — CEPHALEXIN 500 MG PO CAPS: 500.0000 mg | ORAL_CAPSULE | Freq: Four times a day (QID) | ORAL | Status: DC

## 2015-09-01 MED ORDER — CEPHALEXIN 500 MG PO CAPS
500.0000 mg | ORAL_CAPSULE | Freq: Once | ORAL | Status: AC
  Administered 2015-09-01: 500 mg via ORAL
  Filled 2015-09-01: qty 1

## 2015-09-01 MED ORDER — SODIUM CHLORIDE 0.9 % IV BOLUS (SEPSIS): 1000.0000 mL | Freq: Once | INTRAVENOUS | Status: DC

## 2015-09-01 NOTE — Discharge Instructions (Signed)
Please take the entire course of antibiotics as prescribed, even if you're feeling better. Please make a follow-up appoint with your primary care physician in the next 3-4 days. Please drink plenty of fluid to stay well-hydrated and to prevent low blood pressure. Continue to take your blood pressure medications as prescribed, but check your blood pressure before you take the medication. If your blood pressure has a systolic less than [id number] or diastolic less than 50, please do not take your blood pressure medications, and call your doctor immediately.  Return to the emergency department if you develop fever, inability to keep down fluids, low blood pressure are low blood sugar, very high blood sugar, or any other symptoms concerning to you.

## 2015-09-01 NOTE — ED Provider Notes (Addendum)
[facility] Emergency Department Provider Note  ____________________________________________  Time seen: Approximately 9:49 AM  I have reviewed the triage vital signs and the nursing notes.   HISTORY  Chief Complaint Hypotension and Leg Swelling    HPI Elizabeth Gutierrez is a 55 y.o. female with a history of hypertension on but not Cipro, Coreg and spironolactone,CAD status post MI, CHF, DM presenting with hypotension. The patient was going for routine follow-up of chronic right lower extremity swelling which is unchanged in character or severity and was found to have a blood pressure in the 90s over 50s. She lives in a group home and has daily blood pressures, which usually range in the 120s and 130s. She has not had any recent changes in her medications, nor any recent illness including cough or cold symptoms, nausea vomiting or diarrhea, change in her by mouth intake, fever or chills. No urinary symptoms. No chest pain, exertional or stationary shortness of breath, orthopnea, calf pain.   Past Medical History  Diagnosis Date  . Diabetes ([place])   . Heart attack ([place])   . Coronary artery disease   . CHF (congestive heart failure) [facility])     Patient Active Problem List   Diagnosis Date Noted  . DDD (degenerative disc disease), lumbar 07/09/2015  . Facet syndrome, lumbar 07/09/2015  . Lumbar radiculopathy 07/09/2015  . Sacroiliac joint dysfunction 07/09/2015  . Spinal stenosis, lumbar region, 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  . Diabetic neuropathy ([place]) 07/09/2015  . CAD in native artery 06/16/2015  . Controlled type 2 diabetes mellitus without complication ([place]) 0000000  . Benign essential HTN 06/16/2015  . Obstructive apnea 01/05/2015  . Esophagitis, reflux 08/06/2014  . Edema leg 05/14/2014  . Chronic systolic heart failure ([place]) 05/14/2014  . TI (tricuspid incompetence) 05/14/2014  . Chest pain 12/13/2013  . Combined fat and carbohydrate  induced hyperlipemia 10/09/2013  . Breath shortness 10/09/2013    Past Surgical History  Procedure Laterality Date  . Foot surgery Right     Current Outpatient Rx  Name  Route  Sig  Dispense  Refill  . ARIPiprazole (ABILIFY) 30 MG tablet   Oral   Take 30 mg by mouth every morning.          Marland Kitchen aspirin 81 MG tablet   Oral   Take 81 mg by mouth daily.         Marland Kitchen atorvastatin (LIPITOR) 20 MG tablet   Oral   Take 20 mg by mouth every evening.         . benazepril (LOTENSIN) 5 MG tablet   Oral   Take 5 mg by mouth daily.         . benztropine (COGENTIN) 0.5 MG tablet   Oral   Take 0.5 mg by mouth 2 (two) times daily.         . carvedilol (COREG) 3.125 MG tablet   Oral   Take 3.125 mg by mouth 2 (two) times daily with a meal.         . docusate sodium (COLACE) 100 MG capsule   Oral   Take 100 mg by mouth 2 (two) times daily.         Marland Kitchen gabapentin (NEURONTIN) 300 MG capsule   Oral   Take 300 mg by mouth 3 (three) times daily.         Marland Kitchen levothyroxine (SYNTHROID, LEVOTHROID) 75 MCG tablet   Oral   Take 75 mcg by mouth daily before breakfast.         .  lithium carbonate 300 MG capsule   Oral   Take 300 mg by mouth 2 (two) times daily with a meal.          . metFORMIN (GLUCOPHAGE) 500 MG tablet   Oral   Take 1,000 mg by mouth daily.         . naproxen (NAPROSYN) 500 MG tablet   Oral   Take 500 mg by mouth 2 (two) times daily with a meal.         . omeprazole (PRILOSEC) 40 MG capsule   Oral   Take 40 mg by mouth daily.         . pioglitazone (ACTOS) 15 MG tablet   Oral   Take 15 mg by mouth daily.         . polyethylene glycol powder (MIRALAX) powder   Oral   Take 1 Container by mouth once. 17 G OF POWDER , 1 HEAPING TABLESPOON DISSOLVED IN 8 OZ OF WATER         . sertraline (ZOLOFT) 100 MG tablet   Oral   Take 100 mg by mouth daily.         Marland Kitchen spironolactone (ALDACTONE) 25 MG tablet   Oral   Take 12.5 mg by mouth daily.          Marland Kitchen acetaminophen (TYLENOL) 325 MG tablet   Oral   Take 650 mg by mouth every 4 (four) hours as needed for moderate pain, fever or headache. 2 TABLETS BY MOUTH EVERY FOUR HOURS AS NEEDED         . FLUPHENAZINE DECANOATE IJ   Injection   Inject 25 mg as directed. INJECT INTO THE MUSCLE EVERY TWO WEEKS         . NITROGLYCERIN SL   Sublingual   Place 0.4 mg under the tongue. 1 SL EVERY 5 MIN AS NEEDED FOR CHEST PAIN         . senna (SENOKOT) 8.6 MG tablet   Oral   Take 1 tablet by mouth daily.          . sodium chloride (OCEAN) 0.65 % SOLN nasal spray   Each Nare   Place 2 sprays into both nostrils as needed for congestion.         . sulfamethoxazole-trimethoprim (BACTRIM DS,SEPTRA DS) 800-160 MG per tablet   Oral   Take 1 tablet by mouth 2 (two) times daily. Patient not taking: Reported on 07/09/2015   20 tablet   0     Allergies Review of patient's allergies indicates no known allergies.  Family History  Problem Relation Age of Onset  . Alcohol abuse Father     Social History Social History  Substance Use Topics  . Smoking status: Former Smoker    Quit date: 07/08/2013  . Smokeless tobacco: Former Systems developer  . Alcohol Use: No    Review of Systems Constitutional: No fever/chills.No lightheadedness or syncope. No generalized weakness or fatigue. Change in by mouth intake.  Eyes: No visual changes. ENT: No sore throat. No congestion or rhinorrhea. Cardiovascular: Denies chest pain. Denies palpitations. Respiratory: Denies shortness of breath.  No cough. No orthopnea. Gastrointestinal: No abdominal pain.  No nausea, no vomiting.  No diarrhea.  No constipation. Genitourinary: Negative for dysuria. Musculoskeletal: Negative for back pain. Chronic, unchanged right lower extremity swelling without Pain. Skin: Negative for rash. Neurological: Negative for headaches. No focal numbness, tingling or weakness.   10-point ROS otherwise  negative.  ____________________________________________   PHYSICAL EXAM:  VITAL SIGNS: ED Triage Vitals  Enc Vitals Group     BP 09/01/15 0934 91/54 mmHg     Pulse Rate 09/01/15 0934 70     Resp 09/01/15 0934 18     Temp 09/01/15 0934 98.5 F (36.9 C)     Temp Source 09/01/15 0934 Oral     SpO2 09/01/15 0934 97 %     Weight 09/01/15 0934 205 lb (92.987 kg)     Height 09/01/15 0934 5\' 8"  (1.727 m)     Head Cir --      Peak Flow --      Pain Score --      Pain Loc --      Pain Edu? --      Excl. in [place]? --     Constitutional: Patient is alert and oriented. She is answering questions appropriately. She is able to stand and sit without any difficulty and moves around the room easily. Eyes: Conjunctivae are normal.  EOMI. No scleral icterus. Head: Atraumatic. Nose: No congestion/rhinnorhea. Mouth/Throat: Mucous membranes are moist.  Neck: No stridor.  Supple.  No JVD. Cardiovascular: Normal rate, regular rhythm. No murmurs, rubs or gallops.  Respiratory: Normal respiratory effort.  No accessory muscle use or retractions. Lungs CTAB.  No wheezes, rales or ronchi. Gastrointestinal: Soft, nontender and nondistended.  No guarding or rebound.  No peritoneal signs. Musculoskeletal: Mild nonpitting swelling of the right lower extremity most concentrated around the ankle without pain with range of motion of the right ankle. Normal DP and PT pulses bilaterally. No overlying erythema or obvious joint swelling. No ttp in the calves or palpable cords.  Negative Homan's sign. Neurologic:  A&Ox3.  Speech is clear.  Face and smile are symmetric.  EOMI.  Moves all extremities well. Skin:  Skin is warm, dry and intact. No rash noted. Psychiatric: Mood and affect are normal.   ____________________________________________   LABS (all labs ordered are listed, but only abnormal results are displayed)  Labs Reviewed  URINALYSIS COMPLETEWITH MICROSCOPIC ([place] ONLY) - Abnormal; Notable for the  following:    Color, Urine YELLOW (*)    APPearance CLOUDY (*)    Hgb urine dipstick 1+ (*)    Protein, ur 30 (*)    Leukocytes, UA 3+ (*)    Bacteria, UA FEW (*)    All other components within normal limits  URINE CULTURE  CBC  BASIC METABOLIC PANEL   ____________________________________________  EKG  ED ECG REPORT I, Eula Listen, the attending physician, personally viewed and interpreted this ECG.   Date: 09/01/2015  EKG Time: 100  Rate: 62  Rhythm: normal sinus rhythm  Axis: leftward  Intervals:none  ST&T Change: no STEMI.  Nonspecific twave inversions V1-V6; This is grossly unchanged compared to EKG 9/15  ____________________________________________  RADIOLOGY  No results found.  ____________________________________________   PROCEDURES  Procedure(s) performed: None  Critical Care performed: No ____________________________________________   INITIAL IMPRESSION / ASSESSMENT AND PLAN / ED COURSE  Pertinent labs & imaging results that were available during my care of the patient were reviewed by me and considered in my medical decision making (see chart for details).  55 y.o. female with history of HTN on 3 antihypertensives presenting with hypotension. The patient is completely asymptomatic and has not had any recent illness. She does not have any history that would be suggestive of hypovolemia from losses or decreased intake. She is not having any symptoms that would be suggestive of an acute cardiac pathology. She has  no ascending changes in her medications. It is possible that her hypotension is iatrogenic from her medications, and that her blood pressure is usually checked in the morning prior to administration of medications. I will plan to check basic labs, and EKG, and orthostatics. I do not think the patient has no acute DVT, nor does she have any signs or symptoms of PE. There is no indication for ultrasound imaging today, she has had negative  studies in the past and does not have any worsening of the patient's swelling or new symptoms. We'll check her urine for infection, but I do not see any evidence of infection at this time. If the patient continues to feel well, I will contact her primary care physician for further follow-up.  ----------------------------------------- 12:16 PM on 09/01/2015 -----------------------------------------  The patient does have UTI. She has repeat blood pressure of 117/72 on my exam, and continues to be asymptomatic. I will treat her with oral antibiotics and by mouth fluids. She will be discharged home with close PMD follow-up and understands return precautions. Her caregiver from the group home understands return precautions and follow-up instructions as well.  ____________________________________________  FINAL CLINICAL IMPRESSION(S) / ED DIAGNOSES  Final diagnoses:  UTI (lower urinary tract infection)  Hypotension, unspecified hypotension type      NEW MEDICATIONS STARTED DURING THIS VISIT:  New Prescriptions   No medications on file     Eula Listen, MD 09/01/15 1048  Eula Listen, MD 09/01/15 1217

## 2015-09-01 NOTE — ED Notes (Signed)
Pt sent from [facility] with hypotension, pt states she went there this morning for chronic swelling in the right leg.. Pt is here with her care giver.Elizabeth Gutierrez

## 2015-09-01 NOTE — ED Notes (Signed)
Pt informed to return if any life threatening symptoms occur.  

## 2015-09-04 LAB — URINE CULTURE

## 2015-09-18 ENCOUNTER — Ambulatory Visit (INDEPENDENT_AMBULATORY_CARE_PROVIDER_SITE_OTHER): Payer: Medicare Other | Admitting: Sports Medicine

## 2015-09-18 ENCOUNTER — Encounter: Payer: Self-pay | Admitting: Sports Medicine

## 2015-09-18 DIAGNOSIS — B351 Tinea unguium: Secondary | ICD-10-CM | POA: Diagnosis not present

## 2015-09-18 DIAGNOSIS — M79676 Pain in unspecified toe(s): Secondary | ICD-10-CM

## 2015-09-18 DIAGNOSIS — E1149 Type 2 diabetes mellitus with other diabetic neurological complication: Secondary | ICD-10-CM | POA: Diagnosis not present

## 2015-09-18 NOTE — Progress Notes (Signed)
Patient ID: Elizabeth Gutierrez, female   DOB: 06/03/1960, 55 y.o.   MRN: [id number] Subjective: Elizabeth Gutierrez is a 55 y.o. female patient with history of type 2 diabetes who presents to office today complaining of long, painful nails  while ambulating in shoes; unable to trim. Patient states that the glucose reading this morning was "good". Patient denies any new changes in medication or new problems. Patient denies any new cramping, numbness, burning or tingling in the legs.  Patient Active Problem List   Diagnosis Date Noted  . DDD (degenerative disc disease), lumbar 07/09/2015  . Facet syndrome, lumbar 07/09/2015  . Lumbar radiculopathy 07/09/2015  . Sacroiliac joint dysfunction 07/09/2015  . Spinal stenosis, lumbar region, 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  . Diabetic neuropathy ([place]) 07/09/2015  . CAD in native artery 06/16/2015  . Controlled type 2 diabetes mellitus without complication ([place]) 0000000  . Benign essential HTN 06/16/2015  . Obstructive apnea 01/05/2015  . Esophagitis, reflux 08/06/2014  . Edema leg 05/14/2014  . Chronic systolic heart failure ([place]) 05/14/2014  . TI (tricuspid incompetence) 05/14/2014  . Chest pain 12/13/2013  . Combined fat and carbohydrate induced hyperlipemia 10/09/2013  . Breath shortness 10/09/2013   Current Outpatient Prescriptions on File Prior to Visit  Medication Sig Dispense Refill  . acetaminophen (TYLENOL) 325 MG tablet Take 650 mg by mouth every 4 (four) hours as needed for moderate pain, fever or headache. 2 TABLETS BY MOUTH EVERY FOUR HOURS AS NEEDED    . ARIPiprazole (ABILIFY) 30 MG tablet Take 30 mg by mouth every morning.     Marland Kitchen aspirin 81 MG tablet Take 81 mg by mouth daily.    Marland Kitchen atorvastatin (LIPITOR) 20 MG tablet Take 20 mg by mouth every evening.    . benazepril (LOTENSIN) 5 MG tablet Take 5 mg by mouth daily.    . benztropine (COGENTIN) 0.5 MG tablet Take 0.5 mg by mouth 2 (two) times daily.    . carvedilol (COREG) 3.125 MG  tablet Take 3.125 mg by mouth 2 (two) times daily with a meal.    . cephALEXin (KEFLEX) 500 MG capsule Take 1 capsule (500 mg total) by mouth 4 (four) times daily. 28 capsule 0  . docusate sodium (COLACE) 100 MG capsule Take 100 mg by mouth 2 (two) times daily.    Marland Kitchen FLUPHENAZINE DECANOATE IJ Inject 25 mg as directed. INJECT INTO THE MUSCLE EVERY TWO WEEKS    . gabapentin (NEURONTIN) 300 MG capsule Take 300 mg by mouth 3 (three) times daily.    Marland Kitchen levothyroxine (SYNTHROID, LEVOTHROID) 75 MCG tablet Take 75 mcg by mouth daily before breakfast.    . lithium carbonate 300 MG capsule Take 300 mg by mouth 2 (two) times daily with a meal.     . metFORMIN (GLUCOPHAGE) 500 MG tablet Take 1,000 mg by mouth daily.    . naproxen (NAPROSYN) 500 MG tablet Take 500 mg by mouth 2 (two) times daily with a meal.    . NITROGLYCERIN SL Place 0.4 mg under the tongue. 1 SL EVERY 5 MIN AS NEEDED FOR CHEST PAIN    . omeprazole (PRILOSEC) 40 MG capsule Take 40 mg by mouth daily.    . pioglitazone (ACTOS) 15 MG tablet Take 15 mg by mouth daily.    . polyethylene glycol powder (MIRALAX) powder Take 1 Container by mouth once. 17 G OF POWDER , 1 HEAPING TABLESPOON DISSOLVED IN 8 OZ OF WATER    . senna (SENOKOT) 8.6 MG tablet Take  1 tablet by mouth daily.     . sertraline (ZOLOFT) 100 MG tablet Take 100 mg by mouth daily.    . sodium chloride (OCEAN) 0.65 % SOLN nasal spray Place 2 sprays into both nostrils as needed for congestion.    Marland Kitchen spironolactone (ALDACTONE) 25 MG tablet Take 12.5 mg by mouth daily.    Marland Kitchen sulfamethoxazole-trimethoprim (BACTRIM DS,SEPTRA DS) 800-160 MG per tablet Take 1 tablet by mouth 2 (two) times daily. (Patient not taking: Reported on 07/09/2015) 20 tablet 0   No current facility-administered medications on file prior to visit.   No Known Allergies   Objective: General: Patient is awake, alert, and oriented x 3 and in no acute distress.  Integument: Skin is warm, dry and supple bilateral. Nails  are tender, long, thickened and  dystrophic with subungual debris, consistent with onychomycosis, 1-5 bilateral. No signs of infection. No open lesions or preulcerative lesions present bilateral. Remaining integument unremarkable.  Vasculature:  Dorsalis Pedis pulse 2/4 bilateral. Posterior Tibial pulse  1/4 bilateral.  Capillary fill time <3 sec 1-5 bilateral. Scant hair growth to the level of the digits. Temperature gradient within normal limits. Mild varicosities present bilateral. Trace edema present bilateral.   Neurology: The patient has diminished sensation measured with a 5.07/10g Semmes Weinstein Monofilament at all pedal sites bilateral . Vibratory sensation diminished bilateral with tuning fork. No Babinski sign present bilateral.   Musculoskeletal: Asymptomatic planus and hammertoe gross pedal deformities noted bilateral. Muscular strength 5/5 in all lower extremity muscular groups bilateral without pain or limitation on range of motion . No tenderness with calf compression bilateral.  Assessment and Plan: Problem List Items Addressed This Visit    None    Visit Diagnoses    Dermatophytosis of nail    -  Primary    Pain of toe, unspecified laterality        Type II diabetes mellitus with neurological manifestations ([place])          -Examined patient. -Discussed and educated patient on diabetic foot care, especially with  regards to the vascular, neurological and musculoskeletal systems.  -Stressed the importance of good glycemic control and the detriment of not  controlling glucose levels in relation to the foot. -Mechanically debrided all nails 1-5 bilateral using sterile nail nipper and filed with dremel without incident  -Recommend ice and elevation as needed for trace edema control -Recommend good supportive shoes daily for foot type -Patient awaiting diabetic shoes -Answered all patient questions -Patient to return in 3 months for at risk foot care -Patient advised to  call the office if any problems or questions arise in the meantime.  Landis Martins, DPM

## 2015-09-23 ENCOUNTER — Ambulatory Visit (INDEPENDENT_AMBULATORY_CARE_PROVIDER_SITE_OTHER): Payer: Medicare Other | Admitting: Sports Medicine

## 2015-09-23 DIAGNOSIS — Q6650 Congenital pes planus, unspecified foot: Secondary | ICD-10-CM

## 2015-09-23 DIAGNOSIS — E1149 Type 2 diabetes mellitus with other diabetic neurological complication: Secondary | ICD-10-CM

## 2015-10-09 NOTE — Progress Notes (Signed)
Measured for diabetic shoes and insoles.  Patient discussed with medical assistant. Agree with above. Patient to follow up as scheduled for continued care or sooner if problems or issues arise. -Dr. Cannon Kettle

## 2015-10-30 ENCOUNTER — Ambulatory Visit (INDEPENDENT_AMBULATORY_CARE_PROVIDER_SITE_OTHER): Payer: Medicare Other | Admitting: Sports Medicine

## 2015-10-30 ENCOUNTER — Encounter: Payer: Self-pay | Admitting: Sports Medicine

## 2015-10-30 DIAGNOSIS — E1149 Type 2 diabetes mellitus with other diabetic neurological complication: Secondary | ICD-10-CM | POA: Diagnosis not present

## 2015-10-30 DIAGNOSIS — M2040 Other hammer toe(s) (acquired), unspecified foot: Secondary | ICD-10-CM | POA: Diagnosis not present

## 2015-10-30 DIAGNOSIS — M79676 Pain in unspecified toe(s): Secondary | ICD-10-CM

## 2015-10-30 DIAGNOSIS — M19079 Primary osteoarthritis, unspecified ankle and foot: Secondary | ICD-10-CM

## 2015-10-30 DIAGNOSIS — Q6650 Congenital pes planus, unspecified foot: Secondary | ICD-10-CM

## 2015-10-30 DIAGNOSIS — L84 Corns and callosities: Secondary | ICD-10-CM | POA: Diagnosis not present

## 2015-10-30 DIAGNOSIS — M2140 Flat foot [pes planus] (acquired), unspecified foot: Secondary | ICD-10-CM

## 2015-10-30 NOTE — Progress Notes (Signed)
Dispensed diabetic shoes and 3 pairs of insoles. Instructions were reviewed and a copy was given to the patient. Patient to reappointment for regularly scheduled diabetic foot care visits or if she experiences any trouble with her diabetic shoes. 

## 2015-10-30 NOTE — Progress Notes (Signed)
Patient discussed with medical assistant. Agree with below. Patient to follow up as scheduled for continued care or sooner if problems or issues arise. -Dr. Jase Reep  

## 2015-11-16 ENCOUNTER — Other Ambulatory Visit: Payer: Self-pay | Admitting: Family Medicine

## 2015-11-16 DIAGNOSIS — Z1231 Encounter for screening mammogram for malignant neoplasm of breast: Secondary | ICD-10-CM

## 2015-11-19 DIAGNOSIS — R32 Unspecified urinary incontinence: Secondary | ICD-10-CM | POA: Insufficient documentation

## 2015-12-25 ENCOUNTER — Ambulatory Visit
Admission: RE | Admit: 2015-12-25 | Discharge: 2015-12-25 | Disposition: A | Discharge status: Home / Self Care | Payer: Medicare Other | Source: Ambulatory Visit | Attending: Family Medicine | Admitting: Family Medicine

## 2015-12-25 ENCOUNTER — Other Ambulatory Visit: Payer: Self-pay | Admitting: Family Medicine

## 2015-12-25 DIAGNOSIS — Z1231 Encounter for screening mammogram for malignant neoplasm of breast: Secondary | ICD-10-CM | POA: Insufficient documentation

## 2016-01-31 ENCOUNTER — Encounter: Payer: Self-pay | Admitting: Emergency Medicine

## 2016-01-31 ENCOUNTER — Emergency Department
Admission: EM | Admit: 2016-01-31 | Discharge: 2016-01-31 | Disposition: A | Discharge status: Home / Self Care | Payer: Medicare Other | Attending: Emergency Medicine | Admitting: Emergency Medicine

## 2016-01-31 DIAGNOSIS — I2510 Atherosclerotic heart disease of native coronary artery without angina pectoris: Secondary | ICD-10-CM | POA: Diagnosis not present

## 2016-01-31 DIAGNOSIS — S20311A Abrasion of right front wall of thorax, initial encounter: Secondary | ICD-10-CM | POA: Diagnosis not present

## 2016-01-31 DIAGNOSIS — S1091XA Abrasion of unspecified part of neck, initial encounter: Secondary | ICD-10-CM | POA: Insufficient documentation

## 2016-01-31 DIAGNOSIS — Y9389 Activity, other specified: Secondary | ICD-10-CM | POA: Insufficient documentation

## 2016-01-31 DIAGNOSIS — Y9289 Other specified places as the place of occurrence of the external cause: Secondary | ICD-10-CM | POA: Diagnosis not present

## 2016-01-31 DIAGNOSIS — S0081XA Abrasion of other part of head, initial encounter: Secondary | ICD-10-CM | POA: Diagnosis not present

## 2016-01-31 DIAGNOSIS — Z79899 Other long term (current) drug therapy: Secondary | ICD-10-CM | POA: Insufficient documentation

## 2016-01-31 DIAGNOSIS — Y999 Unspecified external cause status: Secondary | ICD-10-CM | POA: Diagnosis not present

## 2016-01-31 DIAGNOSIS — Z8639 Personal history of other endocrine, nutritional and metabolic disease: Secondary | ICD-10-CM

## 2016-01-31 DIAGNOSIS — Z794 Long term (current) use of insulin: Secondary | ICD-10-CM | POA: Diagnosis not present

## 2016-01-31 DIAGNOSIS — Z791 Long term (current) use of non-steroidal anti-inflammatories (NSAID): Secondary | ICD-10-CM | POA: Insufficient documentation

## 2016-01-31 DIAGNOSIS — Z22322 Carrier or suspected carrier of Methicillin resistant Staphylococcus aureus: Secondary | ICD-10-CM | POA: Diagnosis not present

## 2016-01-31 DIAGNOSIS — Z7982 Long term (current) use of aspirin: Secondary | ICD-10-CM | POA: Diagnosis not present

## 2016-01-31 DIAGNOSIS — E119 Type 2 diabetes mellitus without complications: Secondary | ICD-10-CM | POA: Insufficient documentation

## 2016-01-31 DIAGNOSIS — S0990XA Unspecified injury of head, initial encounter: Secondary | ICD-10-CM | POA: Diagnosis present

## 2016-01-31 DIAGNOSIS — I509 Heart failure, unspecified: Secondary | ICD-10-CM | POA: Insufficient documentation

## 2016-01-31 DIAGNOSIS — Z87891 Personal history of nicotine dependence: Secondary | ICD-10-CM | POA: Insufficient documentation

## 2016-01-31 DIAGNOSIS — Z20818 Contact with and (suspected) exposure to other bacterial communicable diseases: Secondary | ICD-10-CM

## 2016-01-31 DIAGNOSIS — S0091XA Abrasion of unspecified part of head, initial encounter: Secondary | ICD-10-CM

## 2016-01-31 MED ORDER — SULFAMETHOXAZOLE-TRIMETHOPRIM 800-160 MG PO TABS: 1.0000 | ORAL_TABLET | Freq: Two times a day (BID) | ORAL | 0 refills | Status: DC

## 2016-01-31 MED ORDER — BACITRACIN ZINC 500 UNIT/GM EX OINT
TOPICAL_OINTMENT | Freq: Two times a day (BID) | CUTANEOUS | Status: DC
  Administered 2016-01-31: 14:00:00 via TOPICAL
  Filled 2016-01-31: qty 0.9

## 2016-01-31 MED ORDER — MUPIROCIN 2 % EX OINT: 1.0000 "application " | TOPICAL_OINTMENT | Freq: Two times a day (BID) | CUTANEOUS | 0 refills | Status: DC

## 2016-01-31 NOTE — ED Notes (Signed)
NAD noted at time of D/C. Pt's caregiver denies questions or concerns. Pt ambulatory to the lobby at this time.   
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
## 2016-01-31 NOTE — ED Notes (Signed)
This RN applied Bacitracin and dry bandages to pt's wounds.

## 2016-04-20 DIAGNOSIS — R269 Unspecified abnormalities of gait and mobility: Secondary | ICD-10-CM | POA: Insufficient documentation

## 2016-06-16 ENCOUNTER — Ambulatory Visit: Payer: Medicare Other | Admitting: Podiatry

## 2016-06-23 ENCOUNTER — Ambulatory Visit (INDEPENDENT_AMBULATORY_CARE_PROVIDER_SITE_OTHER): Payer: Medicare Other | Admitting: Podiatry

## 2016-06-23 ENCOUNTER — Encounter: Payer: Self-pay | Admitting: Podiatry

## 2016-06-23 ENCOUNTER — Ambulatory Visit (INDEPENDENT_AMBULATORY_CARE_PROVIDER_SITE_OTHER): Payer: Medicare Other

## 2016-06-23 DIAGNOSIS — B351 Tinea unguium: Secondary | ICD-10-CM | POA: Diagnosis not present

## 2016-06-23 DIAGNOSIS — M25471 Effusion, right ankle: Secondary | ICD-10-CM | POA: Diagnosis not present

## 2016-06-23 DIAGNOSIS — M79676 Pain in unspecified toe(s): Secondary | ICD-10-CM | POA: Diagnosis not present

## 2016-06-23 DIAGNOSIS — E1149 Type 2 diabetes mellitus with other diabetic neurological complication: Secondary | ICD-10-CM | POA: Diagnosis not present

## 2016-06-23 NOTE — Progress Notes (Signed)
Complaint:  Visit Type: Patient returns to my office for continued preventative foot care services. Complaint: Patient states" my nails have grown long and thick and become painful to walk and wear shoes" Patient has been diagnosed with DM with no foot complications. The patient presents for preventative foot care services. No changes to ROS  Podiatric Exam: Vascular: dorsalis pedis and posterior tibial pulses are palpable bilateral. Capillary return is immediate. Temperature gradient is WNL. Skin turgor WNL  Sensorium: Normal Semmes Weinstein monofilament test. Normal tactile sensation bilaterally. Nail Exam: Pt has thick disfigured discolored nails with subungual debris noted bilateral entire nail hallux through fifth toenails Ulcer Exam: There is no evidence of ulcer or pre-ulcerative changes or infection. Orthopedic Exam: Muscle tone and strength are WNL. No limitations in general ROM. No crepitus or effusions noted. Foot type and digits show no abnormalities. Bony prominences are unremarkable. Swollen right ankle medially and laterally. Skin: No Porokeratosis. No infection or ulcers  Diagnosis:  Onychomycosis, , Pain in right toe, pain in left toes,  Swollen ankle  Treatment & Plan Procedures and Treatment: Consent by patient was obtained for treatment procedures. The patient understood the discussion of treatment and procedures well. All questions were answered thoroughly reviewed. Debridement of mycotic and hypertrophic toenails, 1 through 5 bilateral and clearing of subungual debris. No ulceration, no infection noted. Xray taken reveal no bony pathology. Return Visit-Office Procedure: Patient instructed to return to the office for a follow up visit 3 months for continued evaluation and treatment.    Gardiner Barefoot DPM

## 2016-08-17 ENCOUNTER — Encounter: Payer: Self-pay | Admitting: Emergency Medicine

## 2016-08-17 ENCOUNTER — Emergency Department: Payer: Medicare Other

## 2016-08-17 ENCOUNTER — Observation Stay
Admission: EM | Admit: 2016-08-17 | Discharge: 2016-08-19 | Disposition: A | Discharge status: Home Health | Payer: Medicare Other | Attending: Internal Medicine | Admitting: Internal Medicine

## 2016-08-17 DIAGNOSIS — F70 Mild intellectual disabilities: Secondary | ICD-10-CM | POA: Insufficient documentation

## 2016-08-17 DIAGNOSIS — I252 Old myocardial infarction: Secondary | ICD-10-CM | POA: Insufficient documentation

## 2016-08-17 DIAGNOSIS — Z79899 Other long term (current) drug therapy: Secondary | ICD-10-CM | POA: Insufficient documentation

## 2016-08-17 DIAGNOSIS — I2510 Atherosclerotic heart disease of native coronary artery without angina pectoris: Secondary | ICD-10-CM | POA: Insufficient documentation

## 2016-08-17 DIAGNOSIS — Z791 Long term (current) use of non-steroidal anti-inflammatories (NSAID): Secondary | ICD-10-CM | POA: Diagnosis not present

## 2016-08-17 DIAGNOSIS — I110 Hypertensive heart disease with heart failure: Secondary | ICD-10-CM | POA: Insufficient documentation

## 2016-08-17 DIAGNOSIS — K210 Gastro-esophageal reflux disease with esophagitis: Secondary | ICD-10-CM | POA: Diagnosis not present

## 2016-08-17 DIAGNOSIS — Z7984 Long term (current) use of oral hypoglycemic drugs: Secondary | ICD-10-CM | POA: Insufficient documentation

## 2016-08-17 DIAGNOSIS — G4700 Insomnia, unspecified: Secondary | ICD-10-CM | POA: Diagnosis not present

## 2016-08-17 DIAGNOSIS — I5022 Chronic systolic (congestive) heart failure: Secondary | ICD-10-CM | POA: Insufficient documentation

## 2016-08-17 DIAGNOSIS — F200 Paranoid schizophrenia: Secondary | ICD-10-CM | POA: Diagnosis not present

## 2016-08-17 DIAGNOSIS — R0902 Hypoxemia: Secondary | ICD-10-CM

## 2016-08-17 DIAGNOSIS — Z87891 Personal history of nicotine dependence: Secondary | ICD-10-CM | POA: Diagnosis not present

## 2016-08-17 DIAGNOSIS — I472 Ventricular tachycardia: Secondary | ICD-10-CM | POA: Insufficient documentation

## 2016-08-17 DIAGNOSIS — Z7982 Long term (current) use of aspirin: Secondary | ICD-10-CM | POA: Insufficient documentation

## 2016-08-17 DIAGNOSIS — E782 Mixed hyperlipidemia: Secondary | ICD-10-CM | POA: Insufficient documentation

## 2016-08-17 DIAGNOSIS — R52 Pain, unspecified: Secondary | ICD-10-CM

## 2016-08-17 DIAGNOSIS — E039 Hypothyroidism, unspecified: Secondary | ICD-10-CM | POA: Insufficient documentation

## 2016-08-17 DIAGNOSIS — R4182 Altered mental status, unspecified: Secondary | ICD-10-CM | POA: Diagnosis present

## 2016-08-17 DIAGNOSIS — E1140 Type 2 diabetes mellitus with diabetic neuropathy, unspecified: Secondary | ICD-10-CM | POA: Diagnosis not present

## 2016-08-17 HISTORY — DX: Essential (primary) hypertension: I10

## 2016-08-17 HISTORY — DX: Disorder of thyroid, unspecified: E07.9

## 2016-08-17 HISTORY — DX: Paranoid schizophrenia: F20.0

## 2016-08-17 HISTORY — DX: Hyperlipidemia, unspecified: E78.5

## 2016-08-17 HISTORY — DX: Unspecified intellectual disabilities: F79

## 2016-08-17 LAB — URINALYSIS, COMPLETE (UACMP) WITH MICROSCOPIC
BILIRUBIN URINE: NEGATIVE
Bacteria, UA: NONE SEEN
GLUCOSE, UA: NEGATIVE mg/dL
Hgb urine dipstick: NEGATIVE
Ketones, ur: NEGATIVE mg/dL
LEUKOCYTES UA: NEGATIVE
Nitrite: NEGATIVE
PH: 7 (ref 5.0–8.0)
Protein, ur: NEGATIVE mg/dL
RBC / HPF: NONE SEEN RBC/hpf (ref 0–5)
SPECIFIC GRAVITY, URINE: 1.009 (ref 1.005–1.030)

## 2016-08-17 LAB — CBC WITH DIFFERENTIAL/PLATELET
BASOS PCT: 1 %
Basophils Absolute: 0 10*3/uL (ref 0–0.1)
Eosinophils Absolute: 0.2 10*3/uL (ref 0–0.7)
Eosinophils Relative: 2 %
HEMATOCRIT: 35.1 % (ref 35.0–47.0)
HEMOGLOBIN: 11.1 g/dL — AB (ref 12.0–16.0)
LYMPHS ABS: 1.8 10*3/uL (ref 1.0–3.6)
Lymphocytes Relative: 21 %
MCH: 27 pg (ref 26.0–34.0)
MCHC: 31.8 g/dL — AB (ref 32.0–36.0)
MCV: 84.9 fL (ref 80.0–100.0)
MONO ABS: 0.5 10*3/uL (ref 0.2–0.9)
MONOS PCT: 6 %
NEUTROS ABS: 6 10*3/uL (ref 1.4–6.5)
NEUTROS PCT: 70 %
Platelets: 395 10*3/uL (ref 150–440)
RBC: 4.13 MIL/uL (ref 3.80–5.20)
RDW: 15.1 % — AB (ref 11.5–14.5)
WBC: 8.5 10*3/uL (ref 3.6–11.0)

## 2016-08-17 LAB — TROPONIN I: Troponin I: <0.03 ng/mL (ref <0.03)

## 2016-08-17 LAB — URINE DRUG SCREEN, QUALITATIVE (ARMC ONLY)
AMPHETAMINES, UR SCREEN: NOT DETECTED
Barbiturates, Ur Screen: NOT DETECTED
Benzodiazepine, Ur Scrn: NOT DETECTED
Cannabinoid 50 Ng, Ur ~~LOC~~: NOT DETECTED
Cocaine Metabolite,Ur ~~LOC~~: NOT DETECTED
MDMA (ECSTASY) UR SCREEN: NOT DETECTED
METHADONE SCREEN, URINE: NOT DETECTED
OPIATE, UR SCREEN: NOT DETECTED
Phencyclidine (PCP) Ur S: NOT DETECTED
Tricyclic, Ur Screen: NOT DETECTED

## 2016-08-17 LAB — COMPREHENSIVE METABOLIC PANEL
ALK PHOS: 71 U/L (ref 38–126)
ALT: 11 U/L — ABNORMAL LOW (ref 14–54)
ANION GAP: 5 (ref 5–15)
AST: 15 U/L (ref 15–41)
Albumin: 3.5 g/dL (ref 3.5–5.0)
BILIRUBIN TOTAL: 0.7 mg/dL (ref 0.3–1.2)
BUN: 22 mg/dL — ABNORMAL HIGH (ref 6–20)
CALCIUM: 10.5 mg/dL — AB (ref 8.9–10.3)
CO2: 25 mmol/L (ref 22–32)
Chloride: 108 mmol/L (ref 101–111)
Creatinine, Ser: 1 mg/dL (ref 0.44–1.00)
GLUCOSE: 209 mg/dL — AB (ref 65–99)
POTASSIUM: 4.6 mmol/L (ref 3.5–5.1)
Sodium: 138 mmol/L (ref 135–145)
TOTAL PROTEIN: 6.7 g/dL (ref 6.5–8.1)

## 2016-08-17 LAB — HEPATIC FUNCTION PANEL
ALBUMIN: 3.6 g/dL (ref 3.5–5.0)
ALT: 11 U/L — AB (ref 14–54)
AST: 16 U/L (ref 15–41)
Alkaline Phosphatase: 77 U/L (ref 38–126)
BILIRUBIN TOTAL: 0.6 mg/dL (ref 0.3–1.2)
Bilirubin, Direct: <0.1 mg/dL — ABNORMAL LOW (ref 0.1–0.5)
Total Protein: 7 g/dL (ref 6.5–8.1)

## 2016-08-17 LAB — LITHIUM LEVEL: LITHIUM LVL: 0.7 mmol/L (ref 0.60–1.20)

## 2016-08-17 LAB — GLUCOSE, CAPILLARY: Glucose-Capillary: 189 mg/dL — ABNORMAL HIGH (ref 65–99)

## 2016-08-17 LAB — MAGNESIUM: MAGNESIUM: 2 mg/dL (ref 1.7–2.4)

## 2016-08-17 LAB — LACTIC ACID, PLASMA: Lactic Acid, Venous: 1.3 mmol/L (ref 0.5–1.9)

## 2016-08-17 LAB — TSH
TSH: 0.405 u[IU]/mL (ref 0.350–4.500)
TSH: 0.295 u[IU]/mL — ABNORMAL LOW (ref 0.350–4.500)

## 2016-08-17 LAB — ETHANOL

## 2016-08-17 LAB — AMMONIA: AMMONIA: 22 umol/L (ref 9–35)

## 2016-08-17 LAB — T4, FREE: FREE T4: 1.31 ng/dL — AB (ref 0.61–1.12)

## 2016-08-17 MED ORDER — ONDANSETRON HCL 4 MG/2ML IJ SOLN: 4.0000 mg | Freq: Four times a day (QID) | INTRAMUSCULAR | Status: DC | PRN

## 2016-08-17 MED ORDER — NALOXONE HCL 2 MG/2ML IJ SOSY
0.4000 mg | PREFILLED_SYRINGE | Freq: Once | INTRAMUSCULAR | Status: AC
  Administered 2016-08-17: 0.4 mg via INTRAVENOUS
  Filled 2016-08-17: qty 2

## 2016-08-17 MED ORDER — SERTRALINE HCL 100 MG PO TABS
100.0000 mg | ORAL_TABLET | Freq: Every day | ORAL | Status: DC
  Administered 2016-08-18 – 2016-08-19 (×2): 100 mg via ORAL
  Filled 2016-08-17 (×2): qty 1

## 2016-08-17 MED ORDER — HEPARIN SODIUM (PORCINE) 5000 UNIT/ML IJ SOLN
5000.0000 [IU] | Freq: Three times a day (TID) | INTRAMUSCULAR | Status: DC
  Administered 2016-08-17 – 2016-08-18 (×3): 5000 [IU] via SUBCUTANEOUS
  Filled 2016-08-17 (×3): qty 1

## 2016-08-17 MED ORDER — SODIUM CHLORIDE 0.9 % IV SOLN
INTRAVENOUS | Status: DC
  Administered 2016-08-17 – 2016-08-18 (×2): via INTRAVENOUS

## 2016-08-17 MED ORDER — DOCUSATE SODIUM 100 MG PO CAPS
100.0000 mg | ORAL_CAPSULE | Freq: Two times a day (BID) | ORAL | Status: DC
  Administered 2016-08-17 – 2016-08-19 (×5): 100 mg via ORAL
  Filled 2016-08-17 (×5): qty 1

## 2016-08-17 MED ORDER — ACETAMINOPHEN 650 MG RE SUPP: 650.0000 mg | Freq: Four times a day (QID) | RECTAL | Status: DC | PRN

## 2016-08-17 MED ORDER — SODIUM CHLORIDE 0.9 % IV SOLN
1000.0000 mL | Freq: Once | INTRAVENOUS | Status: AC
  Administered 2016-08-17: 1000 mL via INTRAVENOUS

## 2016-08-17 MED ORDER — ACETAMINOPHEN 325 MG PO TABS
650.0000 mg | ORAL_TABLET | Freq: Four times a day (QID) | ORAL | Status: DC | PRN
Start: 2016-08-17 — End: 2016-08-19
  Administered 2016-08-18 – 2016-08-19 (×2): 650 mg via ORAL
  Filled 2016-08-17 (×2): qty 2

## 2016-08-17 MED ORDER — SENNA 8.6 MG PO TABS
1.0000 | ORAL_TABLET | Freq: Every day | ORAL | Status: DC
  Administered 2016-08-17 – 2016-08-19 (×3): 8.6 mg via ORAL
  Filled 2016-08-17 (×3): qty 1

## 2016-08-17 MED ORDER — ARIPIPRAZOLE 15 MG PO TABS
30.0000 mg | ORAL_TABLET | ORAL | Status: DC
  Administered 2016-08-18 – 2016-08-19 (×2): 30 mg via ORAL
  Filled 2016-08-17 (×3): qty 2

## 2016-08-17 MED ORDER — BENZTROPINE MESYLATE 0.5 MG PO TABS
0.5000 mg | ORAL_TABLET | Freq: Two times a day (BID) | ORAL | Status: DC
  Administered 2016-08-17 – 2016-08-19 (×4): 0.5 mg via ORAL
  Filled 2016-08-17 (×8): qty 1

## 2016-08-17 MED ORDER — ONDANSETRON HCL 4 MG PO TABS: 4.0000 mg | ORAL_TABLET | Freq: Four times a day (QID) | ORAL | Status: DC | PRN

## 2016-08-17 MED ORDER — CARVEDILOL 3.125 MG PO TABS
3.1250 mg | ORAL_TABLET | Freq: Two times a day (BID) | ORAL | Status: DC
  Administered 2016-08-17 – 2016-08-18 (×2): 3.125 mg via ORAL
  Filled 2016-08-17 (×2): qty 1

## 2016-08-17 MED ORDER — IOPAMIDOL (ISOVUE-370) INJECTION 76%
75.0000 mL | Freq: Once | INTRAVENOUS | Status: AC | PRN
  Administered 2016-08-17: 75 mL via INTRAVENOUS

## 2016-08-17 MED ORDER — ASPIRIN EC 81 MG PO TBEC
81.0000 mg | DELAYED_RELEASE_TABLET | Freq: Every day | ORAL | Status: DC
  Administered 2016-08-18 – 2016-08-19 (×2): 81 mg via ORAL
  Filled 2016-08-17 (×2): qty 1

## 2016-08-17 MED ORDER — ATORVASTATIN CALCIUM 20 MG PO TABS
20.0000 mg | ORAL_TABLET | Freq: Every evening | ORAL | Status: DC
  Administered 2016-08-17 – 2016-08-18 (×2): 20 mg via ORAL
  Filled 2016-08-17 (×2): qty 1

## 2016-08-17 MED ORDER — LEVOTHYROXINE SODIUM 75 MCG PO TABS
75.0000 ug | ORAL_TABLET | Freq: Every day | ORAL | Status: DC
  Administered 2016-08-18 – 2016-08-19 (×2): 75 ug via ORAL
  Filled 2016-08-17 (×2): qty 1

## 2016-08-17 MED ORDER — BISACODYL 5 MG PO TBEC: 5.0000 mg | DELAYED_RELEASE_TABLET | Freq: Every day | ORAL | Status: DC | PRN

## 2016-08-17 NOTE — Progress Notes (Signed)
Contacted Agricultural consultant, asked to have RN call Terrence Dupont on [phone] to take report

## 2016-08-17 NOTE — ED Provider Notes (Signed)
[facility] Emergency Department Provider Note       Time seen: ----------------------------------------- 9:34 AM on 08/17/2016 -----------------------------------------  L5 caveat: Review of systems and history is limited by altered mental status.   I have reviewed the triage vital signs and the nursing notes.   HISTORY   Chief Complaint Loss of Consciousness    HPI Elizabeth Gutierrez is a 56 y.o. female who presents to the ED for altered mental status. Patient is a resident of a group home and was brought here for decreased responsiveness. Symptom onset seemed to be today, she was noted to be diaphoretic. No other information is available.   Past Medical History:  Diagnosis Date  . CHF (congestive heart failure) ([place])   . Coronary artery disease   . Diabetes ([place])   . Heart attack [facility])     Patient Active Problem List   Diagnosis Date Noted  . DDD (degenerative disc disease), lumbar 07/09/2015  . Facet syndrome, lumbar ([place]) 07/09/2015  . Lumbar radiculopathy 07/09/2015  . Sacroiliac joint dysfunction 07/09/2015  . Spinal stenosis, lumbar region, 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  . Diabetic neuropathy ([place]) 07/09/2015  . CAD in native artery 06/16/2015  . Controlled type 2 diabetes mellitus without complication ([place]) 72/11/4707  . Benign essential HTN 06/16/2015  . Obstructive apnea 01/05/2015  . Esophagitis, reflux 08/06/2014  . Edema leg 05/14/2014  . Chronic systolic heart failure ([place]) 05/14/2014  . TI (tricuspid incompetence) 05/14/2014  . Chest pain 12/13/2013  . Combined fat and carbohydrate induced hyperlipemia 10/09/2013  . Breath shortness 10/09/2013    Past Surgical History:  Procedure Laterality Date  . FOOT SURGERY Right     Allergies Patient has no known allergies.  Social History Social History  Substance Use Topics  . Smoking status: Former Smoker    Quit date: 07/08/2013  . Smokeless tobacco: Former Systems developer   . Alcohol use No    Review of Systems Unknown, patient diaphoretic with altered mental status  All systems negative/normal/unremarkable except as stated in the HPI  ____________________________________________   PHYSICAL EXAM:  VITAL SIGNS: ED Triage Vitals [08/17/16 0932]  Enc Vitals Group     BP      Pulse      Resp      Temp      Temp src      SpO2      Weight 205 lb (93 kg)     Height 5\' 8"  (1.727 m)     Head Circumference      Peak Flow      Pain Score      Pain Loc      Pain Edu?      Excl. in [place]?     Constitutional: Responsive to verbal or painful stimuli. Mild distress Eyes: Conjunctivae are normal. Normal extraocular movements. ENT   Head: Normocephalic and atraumatic.   Nose: No congestion/rhinnorhea.   Mouth/Throat: Mucous membranes are moist.   Neck: No stridor. Cardiovascular: Normal rate, regular rhythm. No murmurs, rubs, or gallops. Respiratory: Normal respiratory effort without tachypnea nor retractions. Breath sounds are clear and equal bilaterally. No wheezes/rales/rhonchi. Gastrointestinal: Soft and nontender. Normal bowel sounds Musculoskeletal: Nontender with normal range of motion in extremities. No lower extremity tenderness nor edema. Neurologic:  Slurred speech, No gross focal neurologic deficits are appreciated. Responsive to verbal or painful stimuli. Skin:  Skin is warm, dry and intact. Diaphoresis is noted ____________________________________________  EKG: Interpreted by me. Sinus rhythm rate of 90 bpm,  normal PR interval, wide QRS, LVH, left axis deviation, normal QT, anterior and Q waves.  ____________________________________________  ED COURSE:  Pertinent labs & imaging results that were available during my care of the patient were reviewed by me and considered in my medical decision making (see chart for details). Patient presents for altered mental status, we will assess with labs and imaging as indicated. She will  also receive IV Narcan   Procedures ____________________________________________   LABS (pertinent positives/negatives)  Labs Reviewed  CBC WITH DIFFERENTIAL/PLATELET - Abnormal; Notable for the following:       Result Value   Hemoglobin 11.1 (*)    MCHC 31.8 (*)    RDW 15.1 (*)    All other components within normal limits  COMPREHENSIVE METABOLIC PANEL - Abnormal; Notable for the following:    Glucose, Bld 209 (*)    BUN 22 (*)    Calcium 10.5 (*)    ALT 11 (*)    All other components within normal limits  URINALYSIS, COMPLETE (UACMP) WITH MICROSCOPIC - Abnormal; Notable for the following:    Color, Urine STRAW (*)    APPearance CLEAR (*)    Squamous Epithelial / LPF 0-5 (*)    All other components within normal limits  GLUCOSE, CAPILLARY - Abnormal; Notable for the following:    Glucose-Capillary 189 (*)    All other components within normal limits  T4, FREE - Abnormal; Notable for the following:    Free T4 1.31 (*)    All other components within normal limits  TROPONIN I  LITHIUM LEVEL  ETHANOL  URINE DRUG SCREEN, QUALITATIVE ([facility] ONLY)  LACTIC ACID, PLASMA  CBG MONITORING, ED    RADIOLOGY Images were viewed by me  CT head IMPRESSION: 1. No acute intracranial findings. 2. Minimal white matter microvascular disease.IMPRESSION: 1. No acute intracranial findings. 2. Minimal white matter microvascular disease. IMPRESSION: 1. Cardiomegaly. No pulmonary venous congestion.  2. Low lung volumes with mild basilar atelectasis . IMPRESSION: 1. No pulmonary emboli or acute abnormality. 2. Cardiomegaly with mild pulmonary vascular congestion. 3. Small pericardial effusion. 4. Cholelithiasis. 5. Coronary artery atherosclerotic calcifications. 6. Small to moderate-sized hiatal hernia. ____________________________________________  FINAL ASSESSMENT AND PLAN  Altered mental status, Hypoxia  Plan: Patient's labs and imaging were dictated above. Patient had  presented for altered mental status of unclear etiology. To this point I do not have an explanation for her altered mental status. CT, x-ray and laboratory findings are inconclusive at this point. Her vital signs are within normal limits with the exception of nasal cannula oxygen requirement. We will hold any sedating medications, will discuss with the hospitalist for admission.   Earleen Newport, MD   Note: This note was generated in part or whole with voice recognition software. Voice recognition is usually quite accurate but there are transcription errors that can and very often do occur. I apologize for any typographical errors that were not detected and corrected.     Earleen Newport, MD 08/17/16 1224

## 2016-08-17 NOTE — Progress Notes (Signed)
Spoke with Dr. Darvin Neighbours pt had a 20 beat run of v-tach. Md to place orders.

## 2016-08-17 NOTE — Progress Notes (Signed)
Admission: Patient only oriented to self. Mother at bedside and says that she is back to her baseline besides the drowsiness. Patient verbal and able to communicate needs such as hunger, and needing to go to the bathroom. No skin issues. Heart and lung sounds normal. Patient has left leg pain from a bulging disc ,per patient's mother, which has altered patient's mobility. Patient normally uses walker to walk around. Patient barely able to stand now with 2 assist. Patient and mother oriented to room and call bell system.  Deri Fuelling, RN

## 2016-08-17 NOTE — ED Notes (Signed)
Attempted to call report x 1  

## 2016-08-17 NOTE — Progress Notes (Signed)
Signed by guardian at bedside

## 2016-08-17 NOTE — ED Notes (Signed)
Care giver came to nurses station and notified this RN patient needed to use the restroom. Upon entering room, urine is draining from patient off the foot of the bed onto the floor. Soiled linen and brief changed.

## 2016-08-17 NOTE — Care Management Obs Status (Signed)
[place] NOTIFICATION   Patient Details  Name: Elizabeth Gutierrez MRN: [id number] Date of Birth: 03-Mar-1961   Medicare Observation Status Notification Given:  Yes    Beau Fanny, RN 08/17/2016, 1:27 PM

## 2016-08-17 NOTE — ED Notes (Signed)
Patient is more alert at this time. Able to communicate with staff and facility members at bedside.

## 2016-08-17 NOTE — Clinical Social Work Note (Signed)
Clinical Social Work Assessment  Patient Details  Name: Elizabeth Gutierrez MRN: [id number] Date of Birth: 16-Sep-1960  Date of referral:  08/17/16               Reason for consult:  Other (Comment Required) (From [place] in [place]. )                Permission sought to share information with:  Chartered certified accountant granted to share information::  Yes, Verbal Permission Granted  Name::      Elizabeth Gutierrez   Agency::     Relationship::     Contact Information:     Housing/Transportation Living arrangements for the past 2 months:  [place] of Information:  Parent, Facility Patient Interpreter Needed:  None Criminal Activity/Legal Involvement Pertinent to Current Situation/Hospitalization:  No - Comment as needed Significant Relationships:  Parents Lives with:  Facility Resident Do you feel safe going back to the place where you live?    Need for family participation in patient care:  Yes (Comment)  Care giving concerns:  Patient is a resident at [facility] group home located at [address]. [place], [place] [address].    Social Worker assessment / plan:  Holiday representative (CSW) reviewed chart and noted that patient is from [facility]. CSW contacted Suezanne Cheshire RN to get additional information. Per Butch Penny patient's mother Elizabeth Gutierrez is her guardian. Butch Penny reported that patient walks with a walker at baseline and sees psychiatrist Dr. Tamera Punt. Per Butch Penny patient as lived at the [facility] group home for 10-15 years. Per Butch Penny patient was receiving home health PT with their independent PT Elizabeth Gutierrez. Per Butch Penny patient can return to Elizabeth Gutierrez when medically stable. CSW met with patient and her mother Elizabeth Gutierrez was at bedside. Patient was pleasantly confused and did not participate in assessment. Mother confirmed that she is patient's guardian and that patient does live at [facility]. Mother is agreeable for patient to return to [facility] Gutierrez when medically  stable. CSW will continue to follow and assist as needed.   Employment status:  Disabled (Comment on whether or not currently receiving Disability) Insurance information:  Medicare, Medicaid In [place] PT Recommendations:  Not assessed at this time Information / Referral to community resources:  Other (Comment Required) (Patient will return to [facility])  Patient/Family's Response to care:  Patient's mother/ guardian is agreeable for patient to return to [facility] group home.   Patient/Family's Understanding of and Emotional Response to Diagnosis, Current Treatment, and Prognosis:  Patient's mother was very pleasant and thanked CSW for visit.   Emotional Assessment Appearance:  Appears stated age Attitude/Demeanor/Rapport:  Unable to Assess Affect (typically observed):  Unable to Assess Orientation:  Oriented to Self, Fluctuating Orientation (Suspected and/or reported Sund[place]) Alcohol / Substance use:  Not Applicable Psych involvement (Current and /or in the community):  No (Comment)  Discharge Needs  Concerns to be addressed:  Discharge Planning Concerns Readmission within the last 30 days:  No Current discharge risk:  Cognitively Impaired Barriers to Discharge:  Continued Medical Work up   [employer], Veronia Beets, LCSW 08/17/2016, 3:49 PM

## 2016-08-17 NOTE — ED Notes (Signed)
Patient transported to CT at this time. 

## 2016-08-17 NOTE — ED Notes (Signed)
No response from Narcan.

## 2016-08-17 NOTE — ED Notes (Signed)
Staff from facility at bedside.

## 2016-08-17 NOTE — ED Notes (Signed)
Patient transported to CT 
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
## 2016-08-17 NOTE — ED Notes (Signed)
X-ray at bedside

## 2016-08-17 NOTE — NC FL2 (Signed)
[place] LEVEL OF CARE SCREENING TOOL     IDENTIFICATION  Patient Name: Elizabeth Gutierrez Birthdate: 1960-12-02 Sex: female Admission Date (Current Location): 08/17/2016  [place] and [place] Number:  Selena Lesser  ([id number] [facility]) Facility and Address:  [facility], [address] [place], [place] [address]      Provider Number: [id number]  Attending Physician Name and Address:  Max Sane, MD  Relative Name and Phone Number:       Current Level of Care: Hospital Recommended Level of Care: Other (Comment) (Group Home. [name] ) Prior Approval Number:    Date Approved/Denied:   PASRR Number:    Discharge Plan: Domiciliary (Rest home) [name] )    Current Diagnoses: Patient Active Problem List   Diagnosis Date Noted  . Altered mental state 08/17/2016  . DDD (degenerative disc disease), lumbar 07/09/2015  . Facet syndrome, lumbar ([place]) 07/09/2015  . Lumbar radiculopathy 07/09/2015  . Sacroiliac joint dysfunction 07/09/2015  . Spinal stenosis, lumbar region, 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  . Diabetic neuropathy ([place]) 07/09/2015  . CAD in native artery 06/16/2015  . Controlled type 2 diabetes mellitus without complication ([place]) 25/85/2778  . Benign essential HTN 06/16/2015  . Obstructive apnea 01/05/2015  . Esophagitis, reflux 08/06/2014  . Edema leg 05/14/2014  . Chronic systolic heart failure ([place]) 05/14/2014  . TI (tricuspid incompetence) 05/14/2014  . Chest pain 12/13/2013  . Combined fat and carbohydrate induced hyperlipemia 10/09/2013  . Breath shortness 10/09/2013    Orientation RESPIRATION BLADDER Height & Weight     Self  Normal Continent Weight: 207 lb 1.6 oz (93.9 kg) Height:  5\' 7"  (170.2 cm)  BEHAVIORAL SYMPTOMS/MOOD NEUROLOGICAL BOWEL NUTRITION STATUS   (none)  (none) Continent Diet (Diet: NPO to be advanced. )  AMBULATORY STATUS COMMUNICATION OF NEEDS Skin   Supervision Verbally Normal                        Personal Care Assistance Level of Assistance  Bathing, Feeding, Dressing Bathing Assistance: Limited assistance Feeding assistance: Independent Dressing Assistance: Limited assistance     Functional Limitations Info  Sight, Hearing, Speech Sight Info: Adequate Hearing Info: Adequate Speech Info: Adequate    SPECIAL CARE FACTORS FREQUENCY  PT (By licensed PT)     PT Frequency:  (home health 2-3 days per week. )              Contractures      Additional Factors Info  Code Status, Allergies Code Status Info:  (Full Code. ) Allergies Info:  (No Known Allergies. )           Current Medications (08/17/2016):  This is the current hospital active medication list Current Facility-Administered Medications  Medication Dose Route Frequency Provider Last Rate Last Dose  . 0.9 %  sodium chloride infusion   Intravenous Continuous Max Sane, MD      . acetaminophen (TYLENOL) tablet 650 mg  650 mg Oral Q6H PRN Max Sane, MD       Or  . acetaminophen (TYLENOL) suppository 650 mg  650 mg Rectal Q6H PRN Max Sane, MD      . Derrill Memo ON 08/18/2016] ARIPiprazole (ABILIFY) tablet 30 mg  30 mg Oral Blanchard Mane, Vipul, MD      . Derrill Memo ON 08/18/2016] aspirin EC tablet 81 mg  81 mg Oral Daily Manuella Ghazi, Vipul, MD      . atorvastatin (LIPITOR) tablet 20 mg  20 mg Oral  QPM Max Sane, MD      . benztropine (COGENTIN) tablet 0.5 mg  0.5 mg Oral BID Max Sane, MD      . bisacodyl (DULCOLAX) EC tablet 5 mg  5 mg Oral Daily PRN Max Sane, MD      . carvedilol (COREG) tablet 3.125 mg  3.125 mg Oral BID WC Manuella Ghazi, Vipul, MD      . docusate sodium (COLACE) capsule 100 mg  100 mg Oral BID Max Sane, MD      . heparin injection 5,000 Units  5,000 Units Subcutaneous Q8H Max Sane, MD      . Derrill Memo ON 08/18/2016] levothyroxine (SYNTHROID, LEVOTHROID) tablet 75 mcg  75 mcg Oral QAC breakfast Max Sane, MD      . ondansetron (ZOFRAN) tablet 4 mg  4 mg Oral Q6H PRN Max Sane, MD        Or  . ondansetron (ZOFRAN) injection 4 mg  4 mg Intravenous Q6H PRN Max Sane, MD      . senna (SENOKOT) tablet 8.6 mg  1 tablet Oral Daily Max Sane, MD      . Derrill Memo ON 08/18/2016] sertraline (ZOLOFT) tablet 100 mg  100 mg Oral Daily Max Sane, MD         Discharge Medications: Please see discharge summary for a list of discharge medications.  Relevant Imaging Results:  Relevant Lab Results:   Additional Information  (SSN: [id number])  Dajanay Northrup, Veronia Beets, LCSW

## 2016-08-17 NOTE — H&P (Signed)
[facility] NAME: Elizabeth Gutierrez    MR#:  [id number]  DATE OF BIRTH:  01-17-1961  DATE OF ADMISSION:  08/17/2016  PRIMARY CARE PHYSICIAN: Denton Lank, MD   REQUESTING/REFERRING PHYSICIAN: Earleen Newport, MD  CHIEF COMPLAINT:   Chief Complaint  Patient presents with  . Loss of Consciousness    HISTORY OF PRESENT ILLNESS:  Elizabeth Gutierrez  is a 56 y.o. female with a known history of Mild mental retardation, paranoid schizophrenia, resident of [facility] is being admitted for altered mental status.  She was brought here for decreased responsiveness. Symptom onset was this morning, she was noted to be diaphoretic.  Most of the information was obtained from her mother and home in charge who were at the bedside along with medical records and discussion with ED provider.  Patient was in her normal state of health until yesterday.  Started acting weird this morning with some confusion and talking out of her head.  As per her caregivers.  She does have some cough with low-grade fever. PAST MEDICAL HISTORY:   Past Medical History:  Diagnosis Date  . CHF (congestive heart failure) ([place])   . Coronary artery disease   . Diabetes ([place])   . Heart attack ([place])   . Hyperlipidemia   . Hypertension   . MR (mental retardation)    Mild  . Paranoid schizophrenia ([place])   . Thyroid disease     PAST SURGICAL HISTORY:   Past Surgical History:  Procedure Laterality Date  . FOOT SURGERY Right     SOCIAL HISTORY:   Social History  Substance Use Topics  . Smoking status: Former Smoker    Quit date: 07/08/2013  . Smokeless tobacco: Former Systems developer  . Alcohol use No    FAMILY HISTORY:   Family History  Problem Relation Age of Onset  . Alcohol abuse Father   . Breast cancer Neg Hx     DRUG ALLERGIES:  No Known Allergies  REVIEW OF SYSTEMS:   Review of Systems  Constitutional: Positive for fever. Negative for chills and  weight loss.  HENT: Negative for nosebleeds and sore throat.   Eyes: Negative for blurred vision.  Respiratory: Positive for cough. Negative for shortness of breath and wheezing.   Cardiovascular: Negative for chest pain, orthopnea, leg swelling and PND.  Gastrointestinal: Negative for abdominal pain, constipation, diarrhea, heartburn, nausea and vomiting.  Genitourinary: Negative for dysuria and urgency.  Musculoskeletal: Negative for back pain.  Skin: Negative for rash.  Neurological: Negative for dizziness, speech change, focal weakness and headaches.       Altered mental status  Endo/Heme/Allergies: Does not bruise/bleed easily.  Psychiatric/Behavioral: Negative for depression.    MEDICATIONS AT HOME:   Prior to Admission medications   Medication Sig Start Date End Date Taking? Authorizing Provider  ARIPiprazole (ABILIFY) 30 MG tablet Take 30 mg by mouth every morning.    Yes [provider]  aspirin 81 MG tablet Take 81 mg by mouth daily.   Yes [provider]  atorvastatin (LIPITOR) 20 MG tablet Take 20 mg by mouth every evening.   Yes [provider]  benztropine (COGENTIN) 0.5 MG tablet Take 0.5 mg by mouth 2 (two) times daily.   Yes [provider]  carvedilol (COREG) 3.125 MG tablet Take 3.125 mg by mouth 2 (two) times daily with a meal.   Yes [provider]  clotrimazole (LOTRIMIN) 1 % cream Apply  1 application topically 2 (two) times daily.   Yes [provider]  docusate sodium (COLACE) 100 MG capsule Take 100 mg by mouth 2 (two) times daily.   Yes [provider]  FLUPHENAZINE DECANOATE IJ Inject 25 mg as directed. INJECT INTO THE MUSCLE EVERY TWO WEEKS   Yes [provider]  gabapentin (NEURONTIN) 300 MG capsule Take 300 mg by mouth 3 (three) times daily.   Yes [provider]  levothyroxine (SYNTHROID, LEVOTHROID) 75 MCG tablet Take 75 mcg by mouth daily before breakfast.   Yes [provider]  lithium carbonate (LITHOBID) 300 MG CR tablet Take by mouth every 12 (twelve) hours.   Yes [provider]  metFORMIN (GLUCOPHAGE-XR) 500 MG 24 hr tablet Take 1,000 mg by mouth daily with breakfast.   Yes [provider]  mupirocin ointment (BACTROBAN) 2 % Apply 1 application topically 2 (two) times daily. 01/31/16  Yes Hagler, Jami L, PA-C  naproxen (NAPROSYN) 500 MG tablet Take 500 mg by mouth 2 (two) times daily with a meal.   Yes [provider]  omeprazole (PRILOSEC) 40 MG capsule Take 40 mg by mouth daily.   Yes [provider]  pioglitazone (ACTOS) 15 MG tablet Take 15 mg by mouth daily.   Yes [provider]  polyethylene glycol powder (MIRALAX) powder Take 1 Container by mouth once. 17 G OF POWDER , 1 HEAPING TABLESPOON DISSOLVED IN 8 OZ OF WATER   Yes [provider]  senna (SENOKOT) 8.6 MG tablet Take 1 tablet by mouth daily.    Yes [provider]  sertraline (ZOLOFT) 100 MG tablet Take 100 mg by mouth daily.   Yes [provider]  acetaminophen (TYLENOL) 325 MG tablet Take 650 mg by mouth every 4 (four) hours as needed for moderate pain, fever or headache. 2 TABLETS BY MOUTH EVERY FOUR HOURS AS NEEDED    [provider]  dextromethorphan-guaiFENesin (TUSSIN DM) 10-100 MG/5ML liquid Take 10 mLs by mouth every 6 (six) hours as needed for cough.    [provider]  NITROGLYCERIN SL Place 0.4 mg under the tongue. 1 SL EVERY 5 MIN AS NEEDED FOR CHEST PAIN    [provider]      VITAL SIGNS:  Blood pressure 106/88, pulse (!) 113, temperature 97.6 F (36.4 C), temperature source Oral, resp. rate (!) 24, height 5\' 8"  (1.727 m), weight 93 kg (205 lb), SpO2 97 %.  PHYSICAL EXAMINATION:  Physical Exam  GENERAL:  56 y.o.-year-old patient lying in the bed with no acute distress.  EYES: Pupils equal, round, reactive to light and accommodation. No scleral icterus. Extraocular  muscles intact.  HEENT: Head atraumatic, normocephalic. Oropharynx and nasopharynx clear.  NECK:  Supple, no jugular venous distention. No thyroid enlargement, no tenderness.  LUNGS: Normal breath sounds bilaterally, no wheezing, rales,rhonchi or crepitation. No use of accessory muscles of respiration.  CARDIOVASCULAR: S1, S2 normal. No murmurs, rubs, or gallops.  ABDOMEN: Soft, nontender, nondistended. Bowel sounds present. No organomegaly or mass.  EXTREMITIES: No pedal edema, cyanosis, or clubbing.  NEUROLOGIC: Cranial nerves II through XII are intact. Muscle strength 5/5 in all extremities. Sensation intact. Gait not checked.  PSYCHIATRIC: The patient is alert and oriented x 2 .  She was able to tell me who are her caregivers at the bedside and was also able to identify where she is (in the emergency room).  Could not tell me the year for her birthday SKIN: No obvious rash,  lesion, or ulcer.  LABORATORY PANEL:   CBC  Recent Labs Lab 08/17/16 0934  WBC 8.5  HGB 11.1*  HCT 35.1  PLT 395   ------------------------------------------------------------------------------------------------------------------  Chemistries   Recent Labs Lab 08/17/16 0934  NA 138  K 4.6  CL 108  CO2 25  GLUCOSE 209*  BUN 22*  CREATININE 1.00  CALCIUM 10.5*  AST 15  ALT 11*  ALKPHOS 71  BILITOT 0.7   ------------------------------------------------------------------------------------------------------------------  Cardiac Enzymes  Recent Labs Lab 08/17/16 0934  TROPONINI <0.03   ------------------------------------------------------------------------------------------------------------------  RADIOLOGY:  Dg Chest 1 View  Result Date: 08/17/2016 CLINICAL DATA:  History of CHF.  Unresponsive. EXAM: CHEST 1 VIEW COMPARISON:  CT 12/05/2013. FINDINGS: Cardiomegaly. No pulmonary venous congestion . Low lung volumes with mild basilar atelectasis. No pleural effusion or pneumothorax.  IMPRESSION: 1.  Cardiomegaly.  No pulmonary venous congestion. 2. Low lung volumes with mild basilar atelectasis . Electronically Signed   By: Marcello Moores  Register   On: 08/17/2016 10:28   Ct Head Wo Contrast  Result Date: 08/17/2016 CLINICAL DATA:  Per nurse note due to patient condition. Patient presents to ED via [facility] from [facility]. Staff was driving patient in a van when patient became unresponsive. That would only respond to a sternal rub. Patient at b.*comment was truncated* EXAM: CT HEAD WITHOUT CONTRAST TECHNIQUE: Contiguous axial images were obtained from the base of the skull through the vertex without intravenous contrast. COMPARISON:  None. FINDINGS: Brain: No acute intracranial hemorrhage. No focal mass lesion. No CT evidence of acute infarction. No midline shift or mass effect. No hydrocephalus. Basilar cisterns are patent. Minimal periventricular and deep white matter hypodensities. Vascular: No hyperdense vessel or unexpected calcification. Skull: Normal. Negative for fracture or focal lesion. Sinuses/Orbits: Paranasal sinuses and mastoid air cells are clear. Orbits are clear. Other: None. IMPRESSION: 1. No acute intracranial findings. 2. Minimal white matter microvascular disease. Electronically Signed   By: Suzy Bouchard M.D.   On: 08/17/2016 10:17   Ct Angio Chest Pe W Or Wo Contrast  Addendum Date: 08/17/2016   ADDENDUM REPORT: 08/17/2016 12:11 ADDENDUM: Also noted is mild multinodular goiter formation with no individual nodule greater than 9 mm in size. These are most likely benign in the absence of known clinical risk factors for thyroid carcinoma. Electronically Signed   By: Claudie Revering M.D.   On: 08/17/2016 12:11   Result Date: 08/17/2016 CLINICAL DATA:  Unresponsive. EXAM: CT ANGIOGRAPHY CHEST WITH CONTRAST TECHNIQUE: Multidetector CT imaging of the chest was performed using the standard protocol during bolus administration of intravenous contrast. Multiplanar CT  image reconstructions and MIPs were obtained to evaluate the vascular anatomy. CONTRAST:  75 cc Isovue 370 COMPARISON:  Portable chest obtained earlier today. Chest CTA dated 12/05/2013. FINDINGS: Cardiovascular: Normally opacified pulmonary arteries with no pulmonary arterial filling defects seen. Atheromatous coronary artery calcifications. Enlarged heart. Small pericardial effusion with a maximum thickness of 12 mm. Mediastinum/Nodes: Mildly enlarged and heterogeneous thyroid gland containing small, calcified nodules. The largest individual visible nodule measures 9 mm. No enlarged lymph nodes. Small to moderate-sized hiatal hernia. Lungs/Pleura: Minimal bilateral dependent atelectasis. No lung nodules or pleural fluid. Prominent pulmonary vasculature. Upper Abdomen: Multiple gallstones in the gallbladder. The largest individual stone measures 2.2 cm in maximum diameter. No gallbladder wall thickening or pericholecystic fluid. Probable small upper pole left renal parapelvic cyst or mildly dilated upper pole collecting system with no central collecting system dilatation seen. Musculoskeletal: Mild thoracic spine degenerative changes. Review of  the MIP images confirms the above findings. IMPRESSION: 1. No pulmonary emboli or acute abnormality. 2. Cardiomegaly with mild pulmonary vascular congestion. 3. Small pericardial effusion. 4. Cholelithiasis. 5. Coronary artery atherosclerotic calcifications. 6. Small to moderate-sized hiatal hernia. Electronically Signed: By: Claudie Revering M.D. On: 08/17/2016 12:00   IMPRESSION AND PLAN:   56 year old female with known history of mild mental retardation is being admitted for altered mental status  * Acute metabolic encephalopathy - Unknown etiology at this time - CT head is negative, urine tox is negative, lithium levels within normal limits, lactic acid within normal limit, UA within normal limits.  Her chest x-ray and CT chest within normal limits as well -  Neurology consultation - Check ammonia level, TSH  * Anemia - Mild, likely of chronic disease  * Hypothyroidism - Continue levothyroxine, check TSH  * Sciatica with lumbar facet syndrome and radiculopathy symptoms - Had injection of her lower back in the past - She is not using any narcotics - Not on any steroids either - Tylenol for pain control    All the records are reviewed and case discussed with ED provider. Management plans discussed with the patient, family and they are in agreement.  CODE STATUS: Full code  TOTAL TIME TAKING CARE OF THIS PATIENT: 45 minutes.    Max Sane M.D on 08/17/2016 at 2:57 PM  Between 7am to 6pm - Pager - [phone]  After 6pm go to [email or web address] - Proofreader  [facility]  Office  [phone]  CC: Primary care physician; Denton Lank, MD   Note: This dictation was prepared with Dragon dictation along with smaller phrase technology. Any transcriptional errors that result from this process are unintentional.

## 2016-08-17 NOTE — ED Notes (Signed)
Patient placed on 6L [place] at this time.

## 2016-08-17 NOTE — ED Notes (Signed)
Last known well today at 0830.

## 2016-08-17 NOTE — ED Notes (Signed)
Attempted to call report x2

## 2016-08-17 NOTE — ED Notes (Signed)
In and out cath completed by Tye Maryland, NS under the supervision of this RN. Sterile technique maintained. Patient tolerated well. Clear, yellow urine returned.

## 2016-08-18 ENCOUNTER — Observation Stay: Payer: Medicare Other

## 2016-08-18 DIAGNOSIS — R4182 Altered mental status, unspecified: Secondary | ICD-10-CM | POA: Diagnosis not present

## 2016-08-18 DIAGNOSIS — G4700 Insomnia, unspecified: Secondary | ICD-10-CM | POA: Diagnosis not present

## 2016-08-18 LAB — BASIC METABOLIC PANEL
Anion gap: 8 (ref 5–15)
BUN: 16 mg/dL (ref 6–20)
CALCIUM: 10 mg/dL (ref 8.9–10.3)
CO2: 19 mmol/L — ABNORMAL LOW (ref 22–32)
CREATININE: 1.05 mg/dL — AB (ref 0.44–1.00)
Chloride: 111 mmol/L (ref 101–111)
GFR calc Af Amer: >60 mL/min (ref >60)
GFR calc non Af Amer: 58 mL/min — ABNORMAL LOW (ref >60)
Glucose, Bld: 287 mg/dL — ABNORMAL HIGH (ref 65–99)
Potassium: 4.7 mmol/L (ref 3.5–5.1)
SODIUM: 138 mmol/L (ref 135–145)

## 2016-08-18 LAB — CBC
HCT: 34.2 % — ABNORMAL LOW (ref 35.0–47.0)
Hemoglobin: 11 g/dL — ABNORMAL LOW (ref 12.0–16.0)
MCH: 26.9 pg (ref 26.0–34.0)
MCHC: 32.1 g/dL (ref 32.0–36.0)
MCV: 83.6 fL (ref 80.0–100.0)
PLATELETS: 388 10*3/uL (ref 150–440)
RBC: 4.09 MIL/uL (ref 3.80–5.20)
RDW: 15.4 % — AB (ref 11.5–14.5)
WBC: 10.1 10*3/uL (ref 3.6–11.0)

## 2016-08-18 LAB — HIV ANTIBODY (ROUTINE TESTING W REFLEX): HIV SCREEN 4TH GENERATION: NONREACTIVE

## 2016-08-18 LAB — GLUCOSE, CAPILLARY
GLUCOSE-CAPILLARY: 136 mg/dL — AB (ref 65–99)
GLUCOSE-CAPILLARY: 263 mg/dL — AB (ref 65–99)
Glucose-Capillary: 178 mg/dL — ABNORMAL HIGH (ref 65–99)

## 2016-08-18 LAB — HEMOGLOBIN A1C
HEMOGLOBIN A1C: 7.5 % — AB (ref 4.8–5.6)
MEAN PLASMA GLUCOSE: 169 mg/dL

## 2016-08-18 LAB — MRSA PCR SCREENING: MRSA by PCR: NEGATIVE

## 2016-08-18 MED ORDER — ENOXAPARIN SODIUM 40 MG/0.4ML ~~LOC~~ SOLN
40.0000 mg | SUBCUTANEOUS | Status: DC
Start: 2016-08-18 — End: 2016-08-19
  Administered 2016-08-18: 40 mg via SUBCUTANEOUS
  Filled 2016-08-18: qty 0.4

## 2016-08-18 MED ORDER — INSULIN ASPART 100 UNIT/ML ~~LOC~~ SOLN
0.0000 [IU] | Freq: Three times a day (TID) | SUBCUTANEOUS | Status: DC
  Administered 2016-08-18: 2 [IU] via SUBCUTANEOUS
  Administered 2016-08-19: 1 [IU] via SUBCUTANEOUS
  Administered 2016-08-19: 3 [IU] via SUBCUTANEOUS
  Filled 2016-08-18: qty 1
  Filled 2016-08-18: qty 2
  Filled 2016-08-18: qty 3

## 2016-08-18 MED ORDER — CARVEDILOL 6.25 MG PO TABS
6.2500 mg | ORAL_TABLET | Freq: Two times a day (BID) | ORAL | Status: DC
  Administered 2016-08-18 – 2016-08-19 (×2): 6.25 mg via ORAL
  Filled 2016-08-18 (×2): qty 1

## 2016-08-18 MED ORDER — TRAZODONE HCL 50 MG PO TABS
50.0000 mg | ORAL_TABLET | Freq: Every day | ORAL | Status: DC
  Administered 2016-08-18: 50 mg via ORAL
  Filled 2016-08-18: qty 1

## 2016-08-18 MED ORDER — INSULIN ASPART 100 UNIT/ML ~~LOC~~ SOLN
0.0000 [IU] | Freq: Every day | SUBCUTANEOUS | Status: DC
  Administered 2016-08-18: 3 [IU] via SUBCUTANEOUS
  Filled 2016-08-18: qty 3

## 2016-08-18 MED ORDER — MAGNESIUM SULFATE 2 GM/50ML IV SOLN
2.0000 g | Freq: Once | INTRAVENOUS | Status: AC
  Administered 2016-08-18: 2 g via INTRAVENOUS
  Filled 2016-08-18: qty 50

## 2016-08-18 NOTE — Progress Notes (Signed)
Patient ID: Elizabeth Gutierrez, female   DOB: 06/25/1960, 56 y.o.   MRN: [id number]  [facility] PROGRESS NOTE  LAINY WROBLESKI GBT:[id number] DOB: 02/12/1961 DOA: 08/17/2016 PCP: Denton Lank, MD  HPI/Subjective: As per the patient's mother, the patient was unresponsive for about 5 hours. The patient's mother states the patient does not sleep very well. She seems to be back to baseline mental status. The patient could not L me why she was here in the hospital but she stated that her left leg is painful.  Objective: Vitals:   08/17/16 2324 08/18/16 0719  BP: 106/67 139/77  Pulse: 87 (!) 108  Resp: 20   Temp: 98.7 F (37.1 C) 98 F (36.7 C)    Filed Weights   08/17/16 0932 08/17/16 1521 08/18/16 0548  Weight: 93 kg (205 lb) 93.9 kg (207 lb 1.6 oz) 95.4 kg (210 lb 4.8 oz)    ROS: Review of Systems  Constitutional: Negative for chills and fever.  Eyes: Negative for blurred vision.  Respiratory: Negative for cough and shortness of breath.   Cardiovascular: Negative for chest pain.  Gastrointestinal: Negative for abdominal pain, constipation, diarrhea, nausea and vomiting.  Genitourinary: Negative for dysuria.  Musculoskeletal: Positive for joint pain and myalgias.  Neurological: Negative for dizziness and headaches.   Exam: Physical Exam  HENT:  Nose: No mucosal edema.  Mouth/Throat: No oropharyngeal exudate or posterior oropharyngeal edema.  Eyes: Conjunctivae, EOM and lids are normal. Pupils are equal, round, and reactive to light.  Neck: No JVD present. Carotid bruit is not present. No edema present. No thyroid mass and no thyromegaly present.  Cardiovascular: S1 normal and S2 normal.  Tachycardia present.  Exam reveals no gallop.   No murmur heard. Pulses:      Dorsalis pedis pulses are 2+ on the right side, and 2+ on the left side.  Respiratory: No respiratory distress. She has no wheezes. She has no rhonchi. She has no rales.  GI: Soft. Bowel sounds are normal. There is  no tenderness.  Musculoskeletal:       Right ankle: She exhibits swelling.       Left ankle: She exhibits swelling.  Lymphadenopathy:    She has no cervical adenopathy.  Neurological: She is alert. No cranial nerve deficit.  Answers questions appropriately  Skin: Skin is warm. No rash noted. Nails show no clubbing.  Psychiatric: She has a normal mood and affect.      Data Reviewed: Basic Metabolic Panel:  Recent Labs Lab 08/17/16 0934 08/17/16 1544 08/18/16 0827  NA 138  --  138  K 4.6  --  4.7  CL 108  --  111  CO2 25  --  19*  GLUCOSE 209*  --  287*  BUN 22*  --  16  CREATININE 1.00  --  1.05*  CALCIUM 10.5*  --  10.0  MG  --  2.0  --    Liver Function Tests:  Recent Labs Lab 08/17/16 0934 08/17/16 1544  AST 15 16  ALT 11* 11*  ALKPHOS 71 77  BILITOT 0.7 0.6  PROT 6.7 7.0  ALBUMIN 3.5 3.6    Recent Labs Lab 08/17/16 1544  AMMONIA 22   CBC:  Recent Labs Lab 08/17/16 0934 08/18/16 0827  WBC 8.5 10.1  NEUTROABS 6.0  --   HGB 11.1* 11.0*  HCT 35.1 34.2*  MCV 84.9 83.6  PLT 395 388   Cardiac Enzymes:  Recent Labs Lab 08/17/16 0934  TROPONINI <0.03  CBG:  Recent Labs Lab 08/17/16 1008 08/18/16 0716  GLUCAP 189* 136*    Recent Results (from the past 240 hour(s))  MRSA PCR Screening     Status: None   Collection Time: 08/18/16  4:34 AM  Result Value Ref Range Status   MRSA by PCR NEGATIVE NEGATIVE Final    Comment:        The GeneXpert MRSA Assay (FDA approved for NASAL specimens only), is one component of a comprehensive MRSA colonization surveillance program. It is not intended to diagnose MRSA infection nor to guide or monitor treatment for MRSA infections.      Studies: Dg Chest 1 View  Result Date: 08/17/2016 CLINICAL DATA:  History of CHF.  Unresponsive. EXAM: CHEST 1 VIEW COMPARISON:  CT 12/05/2013. FINDINGS: Cardiomegaly. No pulmonary venous congestion . Low lung volumes with mild basilar atelectasis. No pleural  effusion or pneumothorax. IMPRESSION: 1.  Cardiomegaly.  No pulmonary venous congestion. 2. Low lung volumes with mild basilar atelectasis . Electronically Signed   By: Marcello Moores  Register   On: 08/17/2016 10:28   Ct Head Wo Contrast  Result Date: 08/17/2016 CLINICAL DATA:  Per nurse note due to patient condition. Patient presents to ED via [facility] from [facility]. Staff was driving patient in a van when patient became unresponsive. That would only respond to a sternal rub. Patient at b.*comment was truncated* EXAM: CT HEAD WITHOUT CONTRAST TECHNIQUE: Contiguous axial images were obtained from the base of the skull through the vertex without intravenous contrast. COMPARISON:  None. FINDINGS: Brain: No acute intracranial hemorrhage. No focal mass lesion. No CT evidence of acute infarction. No midline shift or mass effect. No hydrocephalus. Basilar cisterns are patent. Minimal periventricular and deep white matter hypodensities. Vascular: No hyperdense vessel or unexpected calcification. Skull: Normal. Negative for fracture or focal lesion. Sinuses/Orbits: Paranasal sinuses and mastoid air cells are clear. Orbits are clear. Other: None. IMPRESSION: 1. No acute intracranial findings. 2. Minimal white matter microvascular disease. Electronically Signed   By: Suzy Bouchard M.D.   On: 08/17/2016 10:17   Ct Angio Chest Pe W Or Wo Contrast  Addendum Date: 08/17/2016   ADDENDUM REPORT: 08/17/2016 12:11 ADDENDUM: Also noted is mild multinodular goiter formation with no individual nodule greater than 9 mm in size. These are most likely benign in the absence of known clinical risk factors for thyroid carcinoma. Electronically Signed   By: Claudie Revering M.D.   On: 08/17/2016 12:11   Result Date: 08/17/2016 CLINICAL DATA:  Unresponsive. EXAM: CT ANGIOGRAPHY CHEST WITH CONTRAST TECHNIQUE: Multidetector CT imaging of the chest was performed using the standard protocol during bolus administration of intravenous  contrast. Multiplanar CT image reconstructions and MIPs were obtained to evaluate the vascular anatomy. CONTRAST:  75 cc Isovue 370 COMPARISON:  Portable chest obtained earlier today. Chest CTA dated 12/05/2013. FINDINGS: Cardiovascular: Normally opacified pulmonary arteries with no pulmonary arterial filling defects seen. Atheromatous coronary artery calcifications. Enlarged heart. Small pericardial effusion with a maximum thickness of 12 mm. Mediastinum/Nodes: Mildly enlarged and heterogeneous thyroid gland containing small, calcified nodules. The largest individual visible nodule measures 9 mm. No enlarged lymph nodes. Small to moderate-sized hiatal hernia. Lungs/Pleura: Minimal bilateral dependent atelectasis. No lung nodules or pleural fluid. Prominent pulmonary vasculature. Upper Abdomen: Multiple gallstones in the gallbladder. The largest individual stone measures 2.2 cm in maximum diameter. No gallbladder wall thickening or pericholecystic fluid. Probable small upper pole left renal parapelvic cyst or mildly dilated upper pole collecting system with no  central collecting system dilatation seen. Musculoskeletal: Mild thoracic spine degenerative changes. Review of the MIP images confirms the above findings. IMPRESSION: 1. No pulmonary emboli or acute abnormality. 2. Cardiomegaly with mild pulmonary vascular congestion. 3. Small pericardial effusion. 4. Cholelithiasis. 5. Coronary artery atherosclerotic calcifications. 6. Small to moderate-sized hiatal hernia. Electronically Signed: By: Claudie Revering M.D. On: 08/17/2016 12:00    Scheduled Meds: . ARIPiprazole  30 mg Oral BH-q7a  . aspirin EC  81 mg Oral Daily  . atorvastatin  20 mg Oral QPM  . benztropine  0.5 mg Oral BID  . carvedilol  6.25 mg Oral BID WC  . docusate sodium  100 mg Oral BID  . enoxaparin (LOVENOX) injection  40 mg Subcutaneous Q24H  . insulin aspart  0-5 Units Subcutaneous QHS  . insulin aspart  0-9 Units Subcutaneous TID WC  .  levothyroxine  75 mcg Oral QAC breakfast  . senna  1 tablet Oral Daily  . sertraline  100 mg Oral Daily  . traZODone  50 mg Oral QHS   Continuous Infusions: . magnesium sulfate 1 - 4 g bolus IVPB      Assessment/Plan:  1. Possible syncope. 20 beat run of non-sustained ventricular tachycardia on telemetry. I will give IV magnesium. Increase Coreg to 6.25 mg twice a day. Cardiology consultation. Echocardiogram 2. Acute metabolic encephalopathy. Mental status seems better at this point. 3. Left leg pain. Sonogram of the left leg to rule out DVT 4. Insomnia. Start trazodone at night 5. Hypothyroidism unspecified continue levothyroxine 6. Schizophrenia and mental retardation on numerous psychiatric medications 7. Type 2 diabetes mellitus on sliding scale insulin. Hemoglobin A1c 7.5 8. Chronic back pain and sciatica. Physical therapy consultation  Code Status:     Code Status Orders        Start     Ordered   08/17/16 1519  Full code  Continuous     08/17/16 1518    Code Status History    Date Active Date Inactive Code Status Order ID Comments User Context   This patient has a current code status but no historical code status.     Family Communication: Spoke with mother on the phone Disposition Plan: Potentially back to facility tomorrow  Consultants:  Cardiology  Time spent: 28 minutes  [place], [place]

## 2016-08-18 NOTE — Evaluation (Signed)
Physical Therapy Evaluation Patient Details Name: Elizabeth Gutierrez MRN: [id number] DOB: 03/30/1960 Today's Date: 08/18/2016   History of Present Illness  56 y.o. female with a known history of Mild mental retardation, paranoid schizophrenia, resident [name] is being admitted for altered mental status.  She was brought here for decreased responsiveness, apparently at the time of eval she was at/near her baseline.  Clinical Impression  Pt is able to do some limited ambulation in the room, but impulsively leaned over and essentially refused to walk any more.  Needed heavy encouragement to get back to the bed safely, struggled to stay on task and was limited with how much she could follow along.  Attempted to go over some LE stretch/strength activities but pt did not seem to fully follow.  Pt showed inconsistent effort but was able to do all bed mobility/transfers, it appears to this PT that pt would likely have been able to walk considerably further if she had not started perseverating on needing a wider walker with more wheels. Pt would benefit from HHPT at her group home to work on safety and more specifically with L LE radiating pain.     Follow Up Recommendations Home health PT (pt indicates that she does some exercises (PT) weekly)    Equipment Recommendations       Recommendations for Other Services       Precautions / Restrictions Precautions Precautions: Fall Restrictions Weight Bearing Restrictions: No      Mobility  Bed Mobility Overal bed mobility: Modified Independent             General bed mobility comments: Pt needing only very light UE use of rails  Transfers Overall transfer level: Modified independent Equipment used: Rolling walker (2 wheeled) Transfers: Sit to/from Stand Sit to Stand: Min guard         General transfer comment: Pt needed reminders to use walker/UEs appropriately.  Also to be aware of where she was sitting down and insure  she was safe.  Ambulation/Gait Ambulation/Gait assistance: Min guard;Min assist Ambulation Distance (Feet): 20 Feet Assistive device: Rolling walker (2 wheeled)       General Gait Details: Pt likely could have walked farther but she became focused on the walker having only 2 wheels and she "needed" a wider one with "4 or 5" wheels.  Pt impulsively bent over at the waist and put forearms on walker and went from a decent/reasonable cadence to an impulsive, unsafe altered walk back to bed with limited ability/willingness to follow instructions.   Stairs            Wheelchair Mobility    Modified Rankin (Stroke Patients Only)       Balance Overall balance assessment: Modified Independent (Pt needs assist with safety awareness)                                           Pertinent Vitals/Pain Pain Location: Indicates most pain in the L calf, less so in the thigh - reports no low back/hip pain.  Hypersensitive to any L LE stretch/test. Pain Descriptors / Indicators: Burning    Home Living Family/patient expects to be discharged to:: Group home                      Prior Function Level of Independence:  (Pt reports that she uses  walker, unsure how activty she is)               Hand Dominance        Extremity/Trunk Assessment   Upper Extremity Assessment Upper Extremity Assessment: Difficult to assess due to impaired cognition;Overall [facility] for tasks assessed    Lower Extremity Assessment Lower Extremity Assessment: Overall WFL for tasks assessed;Difficult to assess due to impaired cognition (Pt hesistant with L LE activity, grossly 4-/5 with pain)       Communication   Communication: No difficulties  Cognition Arousal/Alertness: Awake/alert Behavior During Therapy: Impulsive Overall Cognitive Status: History of cognitive impairments - at baseline                                 General Comments: Unsure what pt's  baseline is, she had scattered progression on thought t/o session but was able to follow some basic cuing though she often would quickly get off topic and need redirection      General Comments General comments (skin integrity, edema, etc.): Attempted to teach some h/s and hip ER stretches - she was overall distracted and difficult to stay on task.    Exercises     Assessment/Plan    PT Assessment Patient needs continued PT services  PT Problem List Decreased strength;Decreased activity tolerance;Decreased balance;Pain;Decreased safety awareness;Decreased knowledge of use of DME;Decreased cognition;Decreased mobility;Decreased coordination;Decreased knowledge of precautions       PT Treatment Interventions DME instruction;Stair training;Gait training;Functional mobility training;Therapeutic activities;Balance training;Therapeutic exercise;Neuromuscular re-education;Patient/family education;Cognitive remediation    PT Goals (Current goals can be found in the Care Plan section)  Acute Rehab PT Goals Patient Stated Goal: none stated PT Goal Formulation: Patient unable to participate in goal setting Time For Goal Achievement: 09/01/16 Potential to Achieve Goals: Fair    Frequency Min 2X/week   Barriers to discharge        Co-evaluation               AM-PAC PT "6 Clicks" Daily Activity  Outcome Measure Difficulty turning over in bed (including adjusting bedclothes, sheets and blankets)?: None Difficulty moving from lying on back to sitting on the side of the bed? : None Difficulty sitting down on and standing up from a chair with arms (e.g., wheelchair, bedside commode, etc,.)?: A Little Help needed moving to and from a bed to chair (including a wheelchair)?: A Little Help needed walking in hospital room?: A Lot Help needed climbing 3-5 steps with a railing? : A Lot 6 Click Score: 18    End of Session Equipment Utilized During Treatment: Gait belt Activity Tolerance:  Patient limited by pain Patient left: with bed alarm set;with call bell/phone within reach Nurse Communication: Mobility status PT Visit Diagnosis: Muscle weakness (generalized) (M62.81);Difficulty in walking, not elsewhere classified (R26.2);Pain Pain - Right/Left: Left Pain - part of body: Knee    Time: 1325-1350 PT Time Calculation (min) (ACUTE ONLY): 25 min   Charges:   PT Evaluation $PT Eval Low Complexity: 1 Procedure     PT G Codes:   PT G-Codes **NOT FOR INPATIENT CLASS** Functional Assessment Tool Used: AM-PAC 6 Clicks Basic Mobility Functional Limitation: Mobility: Walking and moving around Mobility: Walking and Moving Around Current Status (Z3299): At least 40 percent but less than 60 percent impaired, limited or restricted Mobility: Walking and Moving Around Goal Status [phone]): At least 1 percent but less than 20 percent impaired, limited or restricted  Kreg Shropshire, DPT 08/18/2016, 2:45 PM

## 2016-08-18 NOTE — Progress Notes (Signed)
Patient is A&O x 4, but limited by mental capacity. Needs to focus on one thing at a time. Up to [facility] with assist x1. Incont of urine at times. BS 136 and 178, received SS insulin coverage as needed. Tele discontinued this shift. On RA. Patient refused TEDs and SCDs. Bed alarm on for safety.

## 2016-08-18 NOTE — Consult Note (Signed)
Reason for Consult:AMS Referring Physician: Leslye Peer  CC: AMS  HPI: Elizabeth Gutierrez is an 56 y.o. female with a known history of mild mental retardation, paranoid schizophrenia, resident of [name] who was admitted for altered mental status.  She was brought here for decreased responsiveness. Symptom onset was yesterday morning when she was noted to be diaphoretic.  Most of the information was obtained from her mother and home in charge who were at the bedside along with medical records and discussion with ED provider.  Patient was in her normal state of health until Tuesday.  Started acting weird yesterday morning with some confusion and talking out of her head.  As per her caregivers.  She does have some cough with low-grade fever.  Past Medical History:  Diagnosis Date  . CHF (congestive heart failure) ([place])   . Coronary artery disease   . Diabetes ([place])   . Heart attack ([place])   . Hyperlipidemia   . Hypertension   . MR (mental retardation)    Mild  . Paranoid schizophrenia ([place])   . Thyroid disease     Past Surgical History:  Procedure Laterality Date  . FOOT SURGERY Right     Family History  Problem Relation Age of Onset  . Alcohol abuse Father   . Breast cancer Neg Hx     Social History:  reports that she quit smoking about 3 years ago. She has quit using smokeless tobacco. She reports that she does not drink alcohol or use drugs.  No Known Allergies  Medications:  I have reviewed the patient's current medications. Prior to Admission:  Prescriptions Prior to Admission  Medication Sig Dispense Refill Last Dose  . ARIPiprazole (ABILIFY) 30 MG tablet Take 30 mg by mouth every morning.    08/17/2016 at 0700  . aspirin 81 MG tablet Take 81 mg by mouth daily.   08/17/2016 at 0700  . atorvastatin (LIPITOR) 20 MG tablet Take 20 mg by mouth every evening.   08/16/2016 at 1900  . benztropine (COGENTIN) 0.5 MG tablet Take 0.5 mg by mouth 2 (two) times daily.   08/17/2016  at 0700  . carvedilol (COREG) 3.125 MG tablet Take 3.125 mg by mouth 2 (two) times daily with a meal.   08/17/2016 at 0700  . clotrimazole (LOTRIMIN) 1 % cream Apply 1 application topically 2 (two) times daily.   08/17/2016 at 0700  . docusate sodium (COLACE) 100 MG capsule Take 100 mg by mouth 2 (two) times daily.   08/17/2016 at 0700  . FLUPHENAZINE DECANOATE IJ Inject 25 mg as directed. INJECT INTO THE MUSCLE EVERY TWO WEEKS   Past Month at Unknown time  . gabapentin (NEURONTIN) 300 MG capsule Take 300 mg by mouth 3 (three) times daily.   08/17/2016 at 0700  . levothyroxine (SYNTHROID, LEVOTHROID) 75 MCG tablet Take 75 mcg by mouth daily before breakfast.   08/17/2016 at 0700  . lithium carbonate (LITHOBID) 300 MG CR tablet Take by mouth every 12 (twelve) hours.   08/17/2016 at 0700  . metFORMIN (GLUCOPHAGE-XR) 500 MG 24 hr tablet Take 1,000 mg by mouth daily with breakfast.   08/17/2016 at 0700  . mupirocin ointment (BACTROBAN) 2 % Apply 1 application topically 2 (two) times daily. 30 g 0 08/17/2016 at 0800  . naproxen (NAPROSYN) 500 MG tablet Take 500 mg by mouth 2 (two) times daily with a meal.   08/17/2016 at 0700  . omeprazole (PRILOSEC) 40 MG capsule Take 40 mg by  mouth daily.   08/17/2016 at 0700  . pioglitazone (ACTOS) 15 MG tablet Take 15 mg by mouth daily.   08/17/2016 at 0700  . polyethylene glycol powder (MIRALAX) powder Take 1 Container by mouth once. 17 G OF POWDER , 1 HEAPING TABLESPOON DISSOLVED IN 8 OZ OF WATER   08/17/2016 at 0800  . senna (SENOKOT) 8.6 MG tablet Take 1 tablet by mouth daily.    08/16/2016 at 1900  . sertraline (ZOLOFT) 100 MG tablet Take 100 mg by mouth daily.   08/17/2016 at 0700  . acetaminophen (TYLENOL) 325 MG tablet Take 650 mg by mouth every 4 (four) hours as needed for moderate pain, fever or headache. 2 TABLETS BY MOUTH EVERY FOUR HOURS AS NEEDED   prn at prn  . dextromethorphan-guaiFENesin (TUSSIN DM) 10-100 MG/5ML liquid Take 10 mLs by mouth every 6 (six) hours as  needed for cough.   prn at prn  . NITROGLYCERIN SL Place 0.4 mg under the tongue. 1 SL EVERY 5 MIN AS NEEDED FOR CHEST PAIN   prn at prn   Scheduled: . ARIPiprazole  30 mg Oral BH-q7a  . aspirin EC  81 mg Oral Daily  . atorvastatin  20 mg Oral QPM  . benztropine  0.5 mg Oral BID  . carvedilol  6.25 mg Oral BID WC  . docusate sodium  100 mg Oral BID  . enoxaparin (LOVENOX) injection  40 mg Subcutaneous Q24H  . insulin aspart  0-5 Units Subcutaneous QHS  . insulin aspart  0-9 Units Subcutaneous TID WC  . levothyroxine  75 mcg Oral QAC breakfast  . senna  1 tablet Oral Daily  . sertraline  100 mg Oral Daily  . traZODone  50 mg Oral QHS    ROS: History obtained from the patient and chart  General ROS: negative for - chills, fatigue, fever, night sweats, weight gain or weight loss Psychological ROS: as noted in HPI Ophthalmic ROS: negative for - blurry vision, double vision, eye pain or loss of vision ENT ROS: negative for - epistaxis, nasal discharge, oral lesions, sore throat, tinnitus or vertigo Allergy and Immunology ROS: negative for - hives or itchy/watery eyes Hematological and Lymphatic ROS: negative for - bleeding problems, bruising or swollen lymph nodes Endocrine ROS: negative for - galactorrhea, hair pattern changes, polydipsia/polyuria or temperature intolerance Respiratory ROS: negative for - cough, hemoptysis, shortness of breath or wheezing Cardiovascular ROS: negative for - chest pain, dyspnea on exertion, edema or irregular heartbeat Gastrointestinal ROS: negative for - abdominal pain, diarrhea, hematemesis, nausea/vomiting or stool incontinence Genito-Urinary ROS: negative for - dysuria, hematuria, incontinence or urinary frequency/urgency Musculoskeletal ROS: left leg pain Neurological ROS: as noted in HPI Dermatological ROS: negative for rash and skin lesion changes  Physical Examination: Blood pressure 139/77, pulse (!) 108, temperature 98 F (36.7 C),  temperature source Oral, resp. rate 20, height 5\' 7"  (1.702 m), weight 95.4 kg (210 lb 4.8 oz), SpO2 100 %.  HEENT-  Normocephalic, no lesions, without obvious abnormality.  Normal external eye and conjunctiva.  Normal TM's bilaterally.  Normal auditory canals and external ears. Normal external nose, mucus membranes and septum.  Normal pharynx. Cardiovascular- S1, S2 normal, pulses palpable throughout   Lungs- chest clear, no wheezing, rales, normal symmetric air entry Abdomen- soft, non-tender; bowel sounds normal; no masses,  no organomegaly Extremities- no edema Lymph-no adenopathy palpable Musculoskeletal-no joint tenderness, deformity or swelling Skin-warm and dry, no hyperpigmentation, vitiligo, or suspicious lesions  Neurological Examination   Mental Status:  Alert, oriented to name and place.  Thought content tangential.  Speech fluent without evidence of aphasia.  Able to follow 3 step commands without difficulty. Cranial Nerves: II: Discs flat bilaterally; Visual fields grossly normal, pupils equal, round, reactive to light and accommodation III,IV, VI: ptosis not present, extra-ocular motions intact bilaterally V,VII: smile symmetric, facial light touch sensation normal bilaterally VIII: hearing normal bilaterally IX,X: gag reflex present XI: bilateral shoulder shrug XII: midline tongue extension Motor: Right : Upper extremity   5/5    Left:     Upper extremity   5/5  Lower extremity   5/5     Lower extremity   5/5 Tone and bulk:normal tone throughout; no atrophy noted Sensory: Pinprick and light touch intact throughout, bilaterally Deep Tendon Reflexes: 2+ and symmetric throughout Plantars: Right: downgoing   Left: downgoing Cerebellar: Normal finger-to-nose and normal heel-to-shin testing bilaterally Gait: not tested due to safety concerns   Laboratory Studies:   Basic Metabolic Panel:  Recent Labs Lab 08/17/16 0934 08/17/16 1544 08/18/16 0827  NA 138  --  138   K 4.6  --  4.7  CL 108  --  111  CO2 25  --  19*  GLUCOSE 209*  --  287*  BUN 22*  --  16  CREATININE 1.00  --  1.05*  CALCIUM 10.5*  --  10.0  MG  --  2.0  --     Liver Function Tests:  Recent Labs Lab 08/17/16 0934 08/17/16 1544  AST 15 16  ALT 11* 11*  ALKPHOS 71 77  BILITOT 0.7 0.6  PROT 6.7 7.0  ALBUMIN 3.5 3.6   No results for input(s): LIPASE, AMYLASE in the last 168 hours.  Recent Labs Lab 08/17/16 1544  AMMONIA 22    CBC:  Recent Labs Lab 08/17/16 0934 08/18/16 0827  WBC 8.5 10.1  NEUTROABS 6.0  --   HGB 11.1* 11.0*  HCT 35.1 34.2*  MCV 84.9 83.6  PLT 395 388    Cardiac Enzymes:  Recent Labs Lab 08/17/16 0934  TROPONINI <0.03    BNP: Invalid input(s): POCBNP  CBG:  Recent Labs Lab 08/17/16 1008 08/18/16 0716  GLUCAP 189* 136*    Microbiology: Results for orders placed or performed during the hospital encounter of 08/17/16  MRSA PCR Screening     Status: None   Collection Time: 08/18/16  4:34 AM  Result Value Ref Range Status   MRSA by PCR NEGATIVE NEGATIVE Final    Comment:        The GeneXpert MRSA Assay (FDA approved for NASAL specimens only), is one component of a comprehensive MRSA colonization surveillance program. It is not intended to diagnose MRSA infection nor to guide or monitor treatment for MRSA infections.     Coagulation Studies: No results for input(s): LABPROT, INR in the last 72 hours.  Urinalysis:  Recent Labs Lab 08/17/16 1008  COLORURINE STRAW*  LABSPEC 1.009  PHURINE 7.0  GLUCOSEU NEGATIVE  HGBUR NEGATIVE  BILIRUBINUR NEGATIVE  KETONESUR NEGATIVE  PROTEINUR NEGATIVE  NITRITE NEGATIVE  LEUKOCYTESUR NEGATIVE    Lipid Panel:     Component Value Date/Time   CHOL 146 04/07/2011 0634   TRIG 116 04/07/2011 0634   HDL 48 04/07/2011 0634   VLDL 23 04/07/2011 0634   LDLCALC 75 04/07/2011 0634    HgbA1C:  Lab Results  Component Value Date   HGBA1C 7.5 (H) 08/17/2016    Urine  Drug Screen:     Component Value  Date/Time   LABOPIA NONE DETECTED 08/17/2016 1008   COCAINSCRNUR NONE DETECTED 08/17/2016 1008   LABBENZ NONE DETECTED 08/17/2016 1008   AMPHETMU NONE DETECTED 08/17/2016 1008   THCU NONE DETECTED 08/17/2016 1008   LABBARB NONE DETECTED 08/17/2016 1008    Alcohol Level:  Recent Labs Lab 08/17/16 [place] <5    Other results: EKG: sinus rhythm at 90 bpm.  Imaging: Dg Chest 1 View  Result Date: 08/17/2016 CLINICAL DATA:  History of CHF.  Unresponsive. EXAM: CHEST 1 VIEW COMPARISON:  CT 12/05/2013. FINDINGS: Cardiomegaly. No pulmonary venous congestion . Low lung volumes with mild basilar atelectasis. No pleural effusion or pneumothorax. IMPRESSION: 1.  Cardiomegaly.  No pulmonary venous congestion. 2. Low lung volumes with mild basilar atelectasis . Electronically Signed   By: Marcello Moores  Register   On: 08/17/2016 10:28   Ct Head Wo Contrast  Result Date: 08/17/2016 CLINICAL DATA:  Per nurse note due to patient condition. Patient presents to ED via [facility] from [place] [facility]. Staff was driving patient in a van when patient became unresponsive. That would only respond to a sternal rub. Patient at b.*comment was truncated* EXAM: CT HEAD WITHOUT CONTRAST TECHNIQUE: Contiguous axial images were obtained from the base of the skull through the vertex without intravenous contrast. COMPARISON:  None. FINDINGS: Brain: No acute intracranial hemorrhage. No focal mass lesion. No CT evidence of acute infarction. No midline shift or mass effect. No hydrocephalus. Basilar cisterns are patent. Minimal periventricular and deep white matter hypodensities. Vascular: No hyperdense vessel or unexpected calcification. Skull: Normal. Negative for fracture or focal lesion. Sinuses/Orbits: Paranasal sinuses and mastoid air cells are clear. Orbits are clear. Other: None. IMPRESSION: 1. No acute intracranial findings. 2. Minimal white matter microvascular disease. Electronically  Signed   By: Suzy Bouchard M.D.   On: 08/17/2016 10:17   Ct Angio Chest Pe W Or Wo Contrast  Addendum Date: 08/17/2016   ADDENDUM REPORT: 08/17/2016 12:11 ADDENDUM: Also noted is mild multinodular goiter formation with no individual nodule greater than 9 mm in size. These are most likely benign in the absence of known clinical risk factors for thyroid carcinoma. Electronically Signed   By: Claudie Revering M.D.   On: 08/17/2016 12:11   Result Date: 08/17/2016 CLINICAL DATA:  Unresponsive. EXAM: CT ANGIOGRAPHY CHEST WITH CONTRAST TECHNIQUE: Multidetector CT imaging of the chest was performed using the standard protocol during bolus administration of intravenous contrast. Multiplanar CT image reconstructions and MIPs were obtained to evaluate the vascular anatomy. CONTRAST:  75 cc Isovue 370 COMPARISON:  Portable chest obtained earlier today. Chest CTA dated 12/05/2013. FINDINGS: Cardiovascular: Normally opacified pulmonary arteries with no pulmonary arterial filling defects seen. Atheromatous coronary artery calcifications. Enlarged heart. Small pericardial effusion with a maximum thickness of 12 mm. Mediastinum/Nodes: Mildly enlarged and heterogeneous thyroid gland containing small, calcified nodules. The largest individual visible nodule measures 9 mm. No enlarged lymph nodes. Small to moderate-sized hiatal hernia. Lungs/Pleura: Minimal bilateral dependent atelectasis. No lung nodules or pleural fluid. Prominent pulmonary vasculature. Upper Abdomen: Multiple gallstones in the gallbladder. The largest individual stone measures 2.2 cm in maximum diameter. No gallbladder wall thickening or pericholecystic fluid. Probable small upper pole left renal parapelvic cyst or mildly dilated upper pole collecting system with no central collecting system dilatation seen. Musculoskeletal: Mild thoracic spine degenerative changes. Review of the MIP images confirms the above findings. IMPRESSION: 1. No pulmonary emboli or  acute abnormality. 2. Cardiomegaly with mild pulmonary vascular congestion. 3. Small pericardial effusion.  4. Cholelithiasis. 5. Coronary artery atherosclerotic calcifications. 6. Small to moderate-sized hiatal hernia. Electronically Signed: By: Claudie Revering M.D. On: 08/17/2016 12:00     Assessment/Plan: 56 year old female with a known history of mild mental retardation, paranoid schizophrenia admitted with altered mental status.  Unclear baseline but do not feel she is far from her baseline at this time.  Head CT reviewed and shows no acute changes. Did appear to be somewhat dehydrated on presentation with tachycardia and elevated BUN.  These have improved.  Suspect mental status changes were metabolic in origin.    Recommendations: No further neurologic intervention is recommended at this time.  If further questions arise, please call or page at that time.  Thank you for allowing neurology to participate in the care of this patient.  Alexis Goodell, MD Neurology [phone] 08/18/2016  2:27 PM

## 2016-08-18 NOTE — Progress Notes (Signed)
Pt has been alert this shift. Still with some confusion Alert to self. Eating and drinking without difficulty. Slept in between care. Iv infusing without difficulty.

## 2016-08-19 ENCOUNTER — Observation Stay
Admit: 2016-08-19 | Discharge: 2016-08-19 | Disposition: A | Discharge status: Home / Self Care | Payer: Medicare Other | Attending: Internal Medicine | Admitting: Internal Medicine

## 2016-08-19 DIAGNOSIS — G4700 Insomnia, unspecified: Secondary | ICD-10-CM | POA: Diagnosis not present

## 2016-08-19 LAB — BASIC METABOLIC PANEL
Anion gap: 3 — ABNORMAL LOW (ref 5–15)
BUN: 18 mg/dL (ref 6–20)
CALCIUM: 10 mg/dL (ref 8.9–10.3)
CHLORIDE: 113 mmol/L — AB (ref 101–111)
CO2: 25 mmol/L (ref 22–32)
CREATININE: 0.9 mg/dL (ref 0.44–1.00)
GFR calc non Af Amer: >60 mL/min (ref >60)
GLUCOSE: 169 mg/dL — AB (ref 65–99)
Potassium: 4.6 mmol/L (ref 3.5–5.1)
Sodium: 141 mmol/L (ref 135–145)

## 2016-08-19 LAB — ECHOCARDIOGRAM COMPLETE
Height: 67 in
WEIGHTICAEL: 3260.8 [oz_av]

## 2016-08-19 LAB — GLUCOSE, CAPILLARY
Glucose-Capillary: 150 mg/dL — ABNORMAL HIGH (ref 65–99)
Glucose-Capillary: 239 mg/dL — ABNORMAL HIGH (ref 65–99)

## 2016-08-19 LAB — MAGNESIUM: Magnesium: 2 mg/dL (ref 1.7–2.4)

## 2016-08-19 MED ORDER — GABAPENTIN 100 MG PO CAPS: 100.0000 mg | ORAL_CAPSULE | Freq: Three times a day (TID) | ORAL | 0 refills | Status: DC

## 2016-08-19 MED ORDER — CARVEDILOL 6.25 MG PO TABS
6.2500 mg | ORAL_TABLET | Freq: Two times a day (BID) | ORAL | 0 refills | Status: DC
Start: 2016-08-19 — End: 2019-01-21

## 2016-08-19 MED ORDER — TRAZODONE HCL 50 MG PO TABS: 50.0000 mg | ORAL_TABLET | Freq: Every day | ORAL | 0 refills | Status: DC

## 2016-08-19 NOTE — Progress Notes (Signed)
Patient is medically stable for D/C back to [name] today. Clinical Education officer, museum (CSW) attempted to contact [place] twice however she did not answer and 2 voicemails were left. CSW contacted Benjamine Mola [phone] [name] Assistant Director and made her aware of above. Per Benjamine Mola patient can return to [facility] today and [name] up between 12:30 and 1 pm. Clinical Social Worker (CSW) prepared D/C packet. CSW contacted patient's mother/ guardian Claudeen Leason and made her aware of above. RN aware of above. Please reconsult if future social work needs arise. CSW signing off.   [employer], LCSW [phone]

## 2016-08-19 NOTE — Discharge Summary (Signed)
[place] [facility] [place] NAME: Elizabeth Gutierrez    MR#:  [id number]  DATE OF BIRTH:  May 27, 1968  DATE OF ADMISSION:  08/17/2016 ADMITTING PHYSICIAN: Max Sane, MD  DATE OF DISCHARGE: 08/19/2016  PRIMARY CARE PHYSICIAN: Denton Lank, MD    ADMISSION DIAGNOSIS:  Hypoxia [R09.02] Altered mental status, unspecified altered mental status type [R41.82]  DISCHARGE DIAGNOSIS:  Active Problems:   Altered mental state   SECONDARY DIAGNOSIS:   Past Medical History:  Diagnosis Date  . CHF (congestive heart failure) ([place])   . Coronary artery disease   . Diabetes ([place])   . Heart attack ([place])   . Hyperlipidemia   . Hypertension   . MR (mental retardation)    Mild  . Paranoid schizophrenia ([place])   . Thyroid disease     HOSPITAL COURSE:   1. Insomnia. I think the patient's major problem that she doesn't sleep and then she compensates by deep sleep at any time. I think the 5 hour period where she was unresponsive is probably from her sleeping deeply. I prescribed trazodone last night. Nursing staff said she slept last night. I will prescribe this upon discharge home. Can consider melatonin also. I think this is less likely syncope. 2. 20 beat run of nonsustained ventricular tachycardia on telemetry. I gave IV magnesium. I increase Coreg to 6.25 mg twice a day. Case discussed with Dr. Nehemiah Massed cardiology and he's not overly concerned because she does have a cardiomyopathy. 3. Left leg pain. Likely sciatica. Ultrasound the lower extremity negative for DVT 4. Hypothyroidism unspecified on levothyroxine 5. Schizophrenia and mental regurgitation on numerous psychiatric medications. Patient crying a lot and emotionally labile 6. Type 2 diabetes mellitus. Restart oral medications 7. Chronic back pain and Sciatica. Home with home health 8. Patient had 1 pulse ox with a pulse ox of 82%. Her other pulse ox's have been normal on room air.  DISCHARGE CONDITIONS:    Fair  CONSULTS OBTAINED:  Treatment Team:  Alexis Goodell, MD Corey Skains, MD  DRUG ALLERGIES:  No Known Allergies  DISCHARGE MEDICATIONS:   Current Discharge Medication List    START taking these medications   Details  traZODone (DESYREL) 50 MG tablet Take 1 tablet (50 mg total) by mouth at bedtime. Qty: 30 tablet, Refills: 0      CONTINUE these medications which have CHANGED   Details  carvedilol (COREG) 6.25 MG tablet Take 1 tablet (6.25 mg total) by mouth 2 (two) times daily with a meal. Qty: 60 tablet, Refills: 0    gabapentin (NEURONTIN) 100 MG capsule Take 1 capsule (100 mg total) by mouth 3 (three) times daily. Qty: 90 capsule, Refills: 0      CONTINUE these medications which have NOT CHANGED   Details  ARIPiprazole (ABILIFY) 30 MG tablet Take 30 mg by mouth every morning.     aspirin 81 MG tablet Take 81 mg by mouth daily.    atorvastatin (LIPITOR) 20 MG tablet Take 20 mg by mouth every evening.    benztropine (COGENTIN) 0.5 MG tablet Take 0.5 mg by mouth 2 (two) times daily.    clotrimazole (LOTRIMIN) 1 % cream Apply 1 application topically 2 (two) times daily.    docusate sodium (COLACE) 100 MG capsule Take 100 mg by mouth 2 (two) times daily.    FLUPHENAZINE DECANOATE IJ Inject 25 mg as directed. INJECT INTO THE MUSCLE EVERY TWO WEEKS    levothyroxine (SYNTHROID, LEVOTHROID) 75 MCG tablet Take  75 mcg by mouth daily before breakfast.    lithium carbonate (LITHOBID) 300 MG CR tablet Take by mouth every 12 (twelve) hours.    metFORMIN (GLUCOPHAGE-XR) 500 MG 24 hr tablet Take 1,000 mg by mouth daily with breakfast.    mupirocin ointment (BACTROBAN) 2 % Apply 1 application topically 2 (two) times daily. Qty: 30 g, Refills: 0    naproxen (NAPROSYN) 500 MG tablet Take 500 mg by mouth 2 (two) times daily with a meal.    omeprazole (PRILOSEC) 40 MG capsule Take 40 mg by mouth daily.    pioglitazone (ACTOS) 15 MG tablet Take 15 mg by mouth  daily.    polyethylene glycol powder (MIRALAX) powder Take 1 Container by mouth once. 17 G OF POWDER , 1 HEAPING TABLESPOON DISSOLVED IN 8 OZ OF WATER    senna (SENOKOT) 8.6 MG tablet Take 1 tablet by mouth daily.     sertraline (ZOLOFT) 100 MG tablet Take 100 mg by mouth daily.    acetaminophen (TYLENOL) 325 MG tablet Take 650 mg by mouth every 4 (four) hours as needed for moderate pain, fever or headache. 2 TABLETS BY MOUTH EVERY FOUR HOURS AS NEEDED    dextromethorphan-guaiFENesin (TUSSIN DM) 10-100 MG/5ML liquid Take 10 mLs by mouth every 6 (six) hours as needed for cough.      STOP taking these medications     NITROGLYCERIN SL          DISCHARGE INSTRUCTIONS:    Follow-up PMD one week  If you experience worsening of your admission symptoms, develop shortness of breath, life threatening emergency, suicidal or homicidal thoughts you must seek medical attention immediately by calling 911 or calling your MD immediately  if symptoms less severe.  You Must read complete instructions/literature along with all the possible adverse reactions/side effects for all the Medicines you take and that have been prescribed to you. Take any new Medicines after you have completely understood and accept all the possible adverse reactions/side effects.   Please note  You were cared for by a hospitalist during your hospital stay. If you have any questions about your discharge medications or the care you received while you were in the hospital after you are discharged, you can call the unit and asked to speak with the hospitalist on call if the hospitalist that took care of you is not available. Once you are discharged, your primary care physician will handle any further medical issues. Please note that NO REFILLS for any discharge medications will be authorized once you are discharged, as it is imperative that you return to your primary care physician (or establish a relationship with a primary care  physician if you do not have one) for your aftercare needs so that they can reassess your need for medications and monitor your lab values.    Today   CHIEF COMPLAINT:   Chief Complaint  Patient presents with  . Loss of Consciousness    HISTORY OF PRESENT ILLNESS:  Keiondra Brookover  is a 56 y.o. female brought in with unresponsive episode   VITAL SIGNS:  Blood pressure 135/71, pulse 90, temperature 98 F (36.7 C), temperature source Oral, resp. rate 18, height 5\' 7"  (1.702 m), weight 92.4 kg (203 lb 12.8 oz), SpO2 94 %.    PHYSICAL EXAMINATION:  GENERAL:  56 y.o.-year-old patient lying in the bed with no acute distress.  EYES: Pupils equal, round, reactive to light and accommodation. No scleral icterus. Extraocular muscles intact.  HEENT: Head atraumatic, normocephalic.  Oropharynx and nasopharynx clear.  NECK:  Supple, no jugular venous distention. No thyroid enlargement, no tenderness.  LUNGS: Normal breath sounds bilaterally, no wheezing, rales,rhonchi or crepitation. No use of accessory muscles of respiration.  CARDIOVASCULAR: S1, S2 normal. No murmurs, rubs, or gallops.  ABDOMEN: Soft, non-tender, non-distended. Bowel sounds present. No organomegaly or mass.  EXTREMITIES: No pedal edema, cyanosis, or clubbing.  NEUROLOGIC: Cranial nerves II through XII are intact. Muscle strength 5/5 in all extremities. Sensation intact. Gait not checked.  PSYCHIATRIC: The patient is alert and answers questions appropriately. Crying at times SKIN: No obvious rash, lesion, or ulcer.   DATA REVIEW:   CBC  Recent Labs Lab 08/18/16 0827  WBC 10.1  HGB 11.0*  HCT 34.2*  PLT 388    Chemistries   Recent Labs Lab 08/17/16 1544  08/19/16 0424  NA  --   < > 141  K  --   < > 4.6  CL  --   < > 113*  CO2  --   < > 25  GLUCOSE  --   < > 169*  BUN  --   < > 18  CREATININE  --   < > 0.90  CALCIUM  --   < > 10.0  MG 2.0  --  2.0  AST 16  --   --   ALT 11*  --   --   ALKPHOS 77  --   --    BILITOT 0.6  --   --   < > = values in this interval not displayed.  Cardiac Enzymes  Recent Labs Lab 08/17/16 0934  TROPONINI <0.03    Microbiology Results  Results for orders placed or performed during the hospital encounter of 08/17/16  MRSA PCR Screening     Status: None   Collection Time: 08/18/16  4:34 AM  Result Value Ref Range Status   MRSA by PCR NEGATIVE NEGATIVE Final    Comment:        The GeneXpert MRSA Assay (FDA approved for NASAL specimens only), is one component of a comprehensive MRSA colonization surveillance program. It is not intended to diagnose MRSA infection nor to guide or monitor treatment for MRSA infections.     RADIOLOGY:  Dg Chest 1 View  Result Date: 08/17/2016 CLINICAL DATA:  History of CHF.  Unresponsive. EXAM: CHEST 1 VIEW COMPARISON:  CT 12/05/2013. FINDINGS: Cardiomegaly. No pulmonary venous congestion . Low lung volumes with mild basilar atelectasis. No pleural effusion or pneumothorax. IMPRESSION: 1.  Cardiomegaly.  No pulmonary venous congestion. 2. Low lung volumes with mild basilar atelectasis . Electronically Signed   By: Marcello Moores  Register   On: 08/17/2016 10:28   Ct Head Wo Contrast  Result Date: 08/17/2016 CLINICAL DATA:  Per nurse note due to patient condition. Patient presents to ED via [facility] from [facility]. Staff was driving patient in a van when patient became unresponsive. That would only respond to a sternal rub. Patient at b.*comment was truncated* EXAM: CT HEAD WITHOUT CONTRAST TECHNIQUE: Contiguous axial images were obtained from the base of the skull through the vertex without intravenous contrast. COMPARISON:  None. FINDINGS: Brain: No acute intracranial hemorrhage. No focal mass lesion. No CT evidence of acute infarction. No midline shift or mass effect. No hydrocephalus. Basilar cisterns are patent. Minimal periventricular and deep white matter hypodensities. Vascular: No hyperdense vessel or unexpected  calcification. Skull: Normal. Negative for fracture or focal lesion. Sinuses/Orbits: Paranasal sinuses and mastoid air cells are  clear. Orbits are clear. Other: None. IMPRESSION: 1. No acute intracranial findings. 2. Minimal white matter microvascular disease. Electronically Signed   By: Suzy Bouchard M.D.   On: 08/17/2016 10:17   Ct Angio Chest Pe W Or Wo Contrast  Addendum Date: 08/17/2016   ADDENDUM REPORT: 08/17/2016 12:11 ADDENDUM: Also noted is mild multinodular goiter formation with no individual nodule greater than 9 mm in size. These are most likely benign in the absence of known clinical risk factors for thyroid carcinoma. Electronically Signed   By: Claudie Revering M.D.   On: 08/17/2016 12:11   Result Date: 08/17/2016 CLINICAL DATA:  Unresponsive. EXAM: CT ANGIOGRAPHY CHEST WITH CONTRAST TECHNIQUE: Multidetector CT imaging of the chest was performed using the standard protocol during bolus administration of intravenous contrast. Multiplanar CT image reconstructions and MIPs were obtained to evaluate the vascular anatomy. CONTRAST:  75 cc Isovue 370 COMPARISON:  Portable chest obtained earlier today. Chest CTA dated 12/05/2013. FINDINGS: Cardiovascular: Normally opacified pulmonary arteries with no pulmonary arterial filling defects seen. Atheromatous coronary artery calcifications. Enlarged heart. Small pericardial effusion with a maximum thickness of 12 mm. Mediastinum/Nodes: Mildly enlarged and heterogeneous thyroid gland containing small, calcified nodules. The largest individual visible nodule measures 9 mm. No enlarged lymph nodes. Small to moderate-sized hiatal hernia. Lungs/Pleura: Minimal bilateral dependent atelectasis. No lung nodules or pleural fluid. Prominent pulmonary vasculature. Upper Abdomen: Multiple gallstones in the gallbladder. The largest individual stone measures 2.2 cm in maximum diameter. No gallbladder wall thickening or pericholecystic fluid. Probable small upper pole left  renal parapelvic cyst or mildly dilated upper pole collecting system with no central collecting system dilatation seen. Musculoskeletal: Mild thoracic spine degenerative changes. Review of the MIP images confirms the above findings. IMPRESSION: 1. No pulmonary emboli or acute abnormality. 2. Cardiomegaly with mild pulmonary vascular congestion. 3. Small pericardial effusion. 4. Cholelithiasis. 5. Coronary artery atherosclerotic calcifications. 6. Small to moderate-sized hiatal hernia. Electronically Signed: By: Claudie Revering M.D. On: 08/17/2016 12:00   US Venous Img Lower Unilateral Left  Result Date: 08/18/2016 CLINICAL DATA:  57 year old female with left lower extremity pain. EXAM: LEFT LOWER EXTREMITY VENOUS DOPPLER ULTRASOUND TECHNIQUE: Gray-scale sonography with graded compression, as well as color Doppler and duplex ultrasound were performed to evaluate the lower extremity deep venous systems from the level of the common femoral vein and including the common femoral, femoral, profunda femoral, popliteal and calf veins including the posterior tibial, peroneal and gastrocnemius veins when visible. The superficial great saphenous vein was also interrogated. Spectral Doppler was utilized to evaluate flow at rest and with distal augmentation maneuvers in the common femoral, femoral and popliteal veins. COMPARISON:  12/12/2014 FINDINGS: Contralateral Common Femoral Vein: Respiratory phasicity is normal and symmetric with the symptomatic side. No evidence of thrombus. Normal compressibility. Common Femoral Vein: No evidence of thrombus. Normal compressibility, respiratory phasicity and response to augmentation. Saphenofemoral Junction: No evidence of thrombus. Normal compressibility and flow on color Doppler imaging. Profunda Femoral Vein: No evidence of thrombus. Normal compressibility and flow on color Doppler imaging. Femoral Vein: No evidence of thrombus. Normal compressibility, respiratory phasicity and  response to augmentation. Popliteal Vein: No evidence of thrombus. Normal compressibility, respiratory phasicity and response to augmentation. Calf Veins: No evidence of thrombus. Normal compressibility and flow on color Doppler imaging. Venous Reflux:  None. Other Findings: Stable to mildly smaller chronic nonvascular fluid collection in the left popliteal fossa, 45 x 9 x 17 mm today (37 x 8 x 24 mm in 2016). See image 46  IMPRESSION: 1.  No evidence of left lower extremity deep venous thrombosis. 2. 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 Electronically Signed   By: Genevie Ann M.D.   On: 08/18/2016 15:48       Management plans discussed with the patient, family and they are in agreement.  CODE STATUS:     Code Status Orders        Start     Ordered   08/17/16 1519  Full code  Continuous     08/17/16 1518    Code Status History    Date Active Date Inactive Code Status Order ID Comments User Context   This patient has a current code status but no historical code status.      TOTAL TIME TAKING CARE OF THIS PATIENT: 35 minutes.    Loletha Grayer M.D on 08/19/2016 at 9:44 AM  Between 7am to 6pm - Pager - [phone]  After 6pm go to [email or web address] - password [email or web address] [facility]  [phone]  CC: Primary care physician; Denton Lank, MD

## 2016-08-19 NOTE — Progress Notes (Signed)
*  PRELIMINARY RESULTS* Echocardiogram 2D Echocardiogram has been performed.  Elizabeth Gutierrez 08/19/2016, 11:49 AM

## 2016-08-19 NOTE — Consult Note (Signed)
[facility] Consultation Note  Patient ID: Elizabeth Gutierrez, MRN: [id number], DOB/AGE: Oct 06, 1960 56 y.o. Admit date: 08/17/2016   Date of Consult: 08/19/2016 Primary Physician: Denton Lank, MD Primary Cardiologist: Nehemiah Massed  Chief Complaint:  Chief Complaint  Patient presents with  . Loss of Consciousness   Reason for Consult: chronic coronary artery disease with heart failure  HPI: 56 y.o. female with known chronic LV systolic dysfunction with ejection fraction of 35% and known coronary artery disease essential hypertension and mixed hyperlipidemia who is been relatively stable of the last several years on appropriate medication management eluding of beta blocker and high intensity cholesterol therapy with atorvastatin. The patient has not had any recent episodes of severe congestive heart failure but has recently had some encephalopathy and concerns of radicular pain of her left leg. The patient has not used any narcotics at this time but is currently suffering from some significant symptoms. The patient has had normal troponin and currently her EKG shows normal sinus rhythm with left axis deviation and left ventricular hypertrophy. She additionally has had sleep apnea for which she has not been using his CPAP machine although there is no significant new signs or symptoms of issues. The patient has not had any cardiovascular symptoms recently and therefore would be at low risk for any further surgical or therapy intervention  Past Medical History:  Diagnosis Date  . CHF (congestive heart failure) ([place])   . Coronary artery disease   . Diabetes ([place])   . Heart attack ([place])   . Hyperlipidemia   . Hypertension   . MR (mental retardation)    Mild  . Paranoid schizophrenia ([place])   . Thyroid disease       Surgical History:  Past Surgical History:  Procedure Laterality Date  . FOOT SURGERY Right      Home Meds: Prior to Admission medications   Medication Sig Start Date End  Date Taking? Authorizing Provider  ARIPiprazole (ABILIFY) 30 MG tablet Take 30 mg by mouth every morning.    Yes [provider]  aspirin 81 MG tablet Take 81 mg by mouth daily.   Yes [provider]  atorvastatin (LIPITOR) 20 MG tablet Take 20 mg by mouth every evening.   Yes [provider]  benztropine (COGENTIN) 0.5 MG tablet Take 0.5 mg by mouth 2 (two) times daily.   Yes [provider]  carvedilol (COREG) 3.125 MG tablet Take 3.125 mg by mouth 2 (two) times daily with a meal.   Yes [provider]  clotrimazole (LOTRIMIN) 1 % cream Apply 1 application topically 2 (two) times daily.   Yes [provider]  docusate sodium (COLACE) 100 MG capsule Take 100 mg by mouth 2 (two) times daily.   Yes [provider]  FLUPHENAZINE DECANOATE IJ Inject 25 mg as directed. INJECT INTO THE MUSCLE EVERY TWO WEEKS   Yes [provider]  gabapentin (NEURONTIN) 300 MG capsule Take 300 mg by mouth 3 (three) times daily.   Yes [provider]  levothyroxine (SYNTHROID, LEVOTHROID) 75 MCG tablet Take 75 mcg by mouth daily before breakfast.   Yes [provider]  lithium carbonate (LITHOBID) 300 MG CR tablet Take by mouth every 12 (twelve) hours.   Yes [provider]  metFORMIN (GLUCOPHAGE-XR) 500 MG 24 hr tablet Take 1,000 mg by mouth daily with breakfast.   Yes [provider]  mupirocin ointment (BACTROBAN) 2 % Apply 1 application topically 2 (two) times daily. 01/31/16  Yes Hagler, Jami L, PA-C  naproxen (NAPROSYN) 500 MG tablet Take 500 mg by mouth 2 (two) times daily with a meal.   Yes [provider]  omeprazole (PRILOSEC) 40 MG capsule Take 40 mg by mouth daily.   Yes [provider]  pioglitazone (ACTOS) 15 MG tablet Take 15 mg by mouth daily.   Yes [provider]  polyethylene glycol powder (MIRALAX) powder Take 1 Container by mouth once. 17 G OF POWDER , 1 HEAPING  TABLESPOON DISSOLVED IN 8 OZ OF WATER   Yes [provider]  senna (SENOKOT) 8.6 MG tablet Take 1 tablet by mouth daily.    Yes [provider]  sertraline (ZOLOFT) 100 MG tablet Take 100 mg by mouth daily.   Yes [provider]  acetaminophen (TYLENOL) 325 MG tablet Take 650 mg by mouth every 4 (four) hours as needed for moderate pain, fever or headache. 2 TABLETS BY MOUTH EVERY FOUR HOURS AS NEEDED    [provider]  dextromethorphan-guaiFENesin (TUSSIN DM) 10-100 MG/5ML liquid Take 10 mLs by mouth every 6 (six) hours as needed for cough.    [provider]  NITROGLYCERIN SL Place 0.4 mg under the tongue. 1 SL EVERY 5 MIN AS NEEDED FOR CHEST PAIN    [provider]    Inpatient Medications:  . ARIPiprazole  30 mg Oral [id number]  . aspirin EC  81 mg Oral Daily  . atorvastatin  20 mg Oral QPM  . benztropine  0.5 mg Oral BID  . carvedilol  6.25 mg Oral BID WC  . docusate sodium  100 mg Oral BID  . enoxaparin (LOVENOX) injection  40 mg Subcutaneous Q24H  . insulin aspart  0-5 Units Subcutaneous QHS  . insulin aspart  0-9 Units Subcutaneous TID WC  . levothyroxine  75 mcg Oral QAC breakfast  . senna  1 tablet Oral Daily  . sertraline  100 mg Oral Daily  . traZODone  50 mg Oral QHS     Allergies: No Known Allergies  Social History   Social History  . Marital status: Single    Spouse name: N/A  . Number of children: N/A  . Years of education: N/A   Occupational History  . Not on file.   Social History Main Topics  . Smoking status: Former Smoker    Quit date: 07/08/2013  . Smokeless tobacco: Former Systems developer  . Alcohol use No  . Drug use: No  . Sexual activity: Not on file   Other Topics Concern  . Not on file   Social History Narrative  . No narrative on file     Family History  Problem Relation Age of Onset  . Alcohol abuse Father   . Breast cancer Neg Hx      Review of Systems Positive for Leg pain Negative  for: General:  chills, fever, night sweats or weight changes.  Cardiovascular: PND orthopnea syncope dizziness  Dermatological skin lesions rashes Respiratory: Cough congestion Urologic: Frequent urination urination at night and hematuria Abdominal: negative for nausea, vomiting, diarrhea, bright red blood per rectum, melena, or hematemesis Neurologic: negative for visual changes, and/or hearing changes  All other systems reviewed and are otherwise negative except as noted above.  Labs:  Recent Labs  08/17/16 0934  TROPONINI <0.03   Lab Results  Component Value Date   WBC 10.1 08/18/2016   HGB 11.0 (L) 08/18/2016   HCT 34.2 (L) 08/18/2016   MCV 83.6 08/18/2016   PLT  388 08/18/2016    Recent Labs Lab 08/17/16 1544  08/19/16 0424  NA  --   < > 141  K  --   < > 4.6  CL  --   < > 113*  CO2  --   < > 25  BUN  --   < > 18  CREATININE  --   < > 0.90  CALCIUM  --   < > 10.0  PROT 7.0  --   --   BILITOT 0.6  --   --   ALKPHOS 77  --   --   ALT 11*  --   --   AST 16  --   --   GLUCOSE  --   < > 169*  < > = values in this interval not displayed. Lab Results  Component Value Date   CHOL 146 04/07/2011   HDL 48 04/07/2011   LDLCALC 75 04/07/2011   TRIG 116 04/07/2011   No results found for: DDIMER  Radiology/Studies:  Dg Chest 1 View  Result Date: 08/17/2016 CLINICAL DATA:  History of CHF.  Unresponsive. EXAM: CHEST 1 VIEW COMPARISON:  CT 12/05/2013. FINDINGS: Cardiomegaly. No pulmonary venous congestion . Low lung volumes with mild basilar atelectasis. No pleural effusion or pneumothorax. IMPRESSION: 1.  Cardiomegaly.  No pulmonary venous congestion. 2. Low lung volumes with mild basilar atelectasis . Electronically Signed   By: Marcello Moores  Register   On: 08/17/2016 10:28   Ct Head Wo Contrast  Result Date: 08/17/2016 CLINICAL DATA:  Per nurse note due to patient condition. Patient presents to ED via [facility]. Staff was driving patient in a van  when patient became unresponsive. That would only respond to a sternal rub. Patient at b.*comment was truncated* EXAM: CT HEAD WITHOUT CONTRAST TECHNIQUE: Contiguous axial images were obtained from the base of the skull through the vertex without intravenous contrast. COMPARISON:  None. FINDINGS: Brain: No acute intracranial hemorrhage. No focal mass lesion. No CT evidence of acute infarction. No midline shift or mass effect. No hydrocephalus. Basilar cisterns are patent. Minimal periventricular and deep white matter hypodensities. Vascular: No hyperdense vessel or unexpected calcification. Skull: Normal. Negative for fracture or focal lesion. Sinuses/Orbits: Paranasal sinuses and mastoid air cells are clear. Orbits are clear. Other: None. IMPRESSION: 1. No acute intracranial findings. 2. Minimal white matter microvascular disease. Electronically Signed   By: Suzy Bouchard M.D.   On: 08/17/2016 10:17   Ct Angio Chest Pe W Or Wo Contrast  Addendum Date: 08/17/2016   ADDENDUM REPORT: 08/17/2016 12:11 ADDENDUM: Also noted is mild multinodular goiter formation with no individual nodule greater than 9 mm in size. These are most likely benign in the absence of known clinical risk factors for thyroid carcinoma. Electronically Signed   By: Claudie Revering M.D.   On: 08/17/2016 12:11   Result Date: 08/17/2016 CLINICAL DATA:  Unresponsive. EXAM: CT ANGIOGRAPHY CHEST WITH CONTRAST TECHNIQUE: Multidetector CT imaging of the chest was performed using the standard protocol during bolus administration of intravenous contrast. Multiplanar CT image reconstructions and MIPs were obtained to evaluate the vascular anatomy. CONTRAST:  75 cc Isovue 370 COMPARISON:  Portable chest obtained earlier today. Chest CTA dated 12/05/2013. FINDINGS: Cardiovascular: Normally opacified pulmonary arteries with no pulmonary arterial filling defects seen. Atheromatous coronary artery calcifications. Enlarged heart. Small pericardial effusion  with a maximum thickness of 12 mm. Mediastinum/Nodes: Mildly enlarged and heterogeneous thyroid gland containing small, calcified nodules. The largest individual visible nodule  measures 9 mm. No enlarged lymph nodes. Small to moderate-sized hiatal hernia. Lungs/Pleura: Minimal bilateral dependent atelectasis. No lung nodules or pleural fluid. Prominent pulmonary vasculature. Upper Abdomen: Multiple gallstones in the gallbladder. The largest individual stone measures 2.2 cm in maximum diameter. No gallbladder wall thickening or pericholecystic fluid. Probable small upper pole left renal parapelvic cyst or mildly dilated upper pole collecting system with no central collecting system dilatation seen. Musculoskeletal: Mild thoracic spine degenerative changes. Review of the MIP images confirms the above findings. IMPRESSION: 1. No pulmonary emboli or acute abnormality. 2. Cardiomegaly with mild pulmonary vascular congestion. 3. Small pericardial effusion. 4. Cholelithiasis. 5. Coronary artery atherosclerotic calcifications. 6. Small to moderate-sized hiatal hernia. Electronically Signed: By: Claudie Revering M.D. On: 08/17/2016 12:00   US Venous Img Lower Unilateral Left  Result Date: 08/18/2016 CLINICAL DATA:  56 year old female with left lower extremity pain. EXAM: LEFT LOWER EXTREMITY VENOUS DOPPLER ULTRASOUND TECHNIQUE: Gray-scale sonography with graded compression, as well as color Doppler and duplex ultrasound were performed to evaluate the lower extremity deep venous systems from the level of the common femoral vein and including the common femoral, femoral, profunda femoral, popliteal and calf veins including the posterior tibial, peroneal and gastrocnemius veins when visible. The superficial great saphenous vein was also interrogated. Spectral Doppler was utilized to evaluate flow at rest and with distal augmentation maneuvers in the common femoral, femoral and popliteal veins. COMPARISON:  12/12/2014 FINDINGS:  Contralateral Common Femoral Vein: Respiratory phasicity is normal and symmetric with the symptomatic side. No evidence of thrombus. Normal compressibility. Common Femoral Vein: No evidence of thrombus. Normal compressibility, respiratory phasicity and response to augmentation. Saphenofemoral Junction: No evidence of thrombus. Normal compressibility and flow on color Doppler imaging. Profunda Femoral Vein: No evidence of thrombus. Normal compressibility and flow on color Doppler imaging. Femoral Vein: No evidence of thrombus. Normal compressibility, respiratory phasicity and response to augmentation. Popliteal Vein: No evidence of thrombus. Normal compressibility, respiratory phasicity and response to augmentation. Calf Veins: No evidence of thrombus. Normal compressibility and flow on color Doppler imaging. Venous Reflux:  None. Other Findings: Stable to mildly smaller chronic nonvascular fluid collection in the left popliteal fossa, 45 x 9 x 17 mm today (37 x 8 x 24 mm in 2016). See image 46 IMPRESSION: 1.  No evidence of left lower extremity deep venous thrombosis. 2. 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 Electronically Signed   By: Genevie Ann M.D.   On: 08/18/2016 15:48    EKG: Normal sinus rhythm with left axis deviation and left ventricular hypertrophy  Weights: Filed Weights   08/17/16 1521 08/18/16 0548 08/19/16 0500  Weight: 93.9 kg (207 lb 1.6 oz) 95.4 kg (210 lb 4.8 oz) 92.4 kg (203 lb 12.8 oz)     Physical Exam: Blood pressure 135/71, pulse 90, temperature 98 F (36.7 C), temperature source Oral, resp. rate 18, height 5\' 7"  (1.702 m), weight 92.4 kg (203 lb 12.8 oz), SpO2 94 %. Body mass index is 31.92 kg/m. General: Well developed, well nourished, in no acute distress. Head eyes ears nose throat: Normocephalic, atraumatic, sclera non-icteric, no xanthomas, nares are without discharge. No apparent thyromegaly and/or mass  Lungs: Normal respiratory effort.  no wheezes, no rales, no  rhonchi.  Heart: RRR with normal S1 S2. 2+ apical murmur gallop, no rub, PMI is normal size and placement, carotid upstroke normal without bruit, jugular venous pressure is normal Abdomen: Soft, non-tender, non-distended with normoactive bowel sounds. No hepatomegaly. No rebound/guarding. No obvious abdominal  masses. Abdominal aorta is normal size without bruit Extremities: Trace to 1+ edema. no cyanosis, no clubbing, no ulcers  Peripheral : 2+ bilateral upper extremity pulses, 2+ bilateral femoral pulses, 2+ bilateral dorsal pedal pulse Neuro: Alert and oriented. No facial asymmetry. No focal deficit. Moves all extremities spontaneously. Musculoskeletal: Normal muscle tone without kyphosis Psych:  Responds to questions appropriately with a normal affect.    Assessment: 56 year old female with known coronary artery disease diabetes with complication sleep apnea chronic systolic dysfunction heart failure with ejection fraction of 3035% and leg pain but no evidence of exacerbation of heart failure or myocardial infarction  Plan: 1. Continue carvedilol for heart rate control and blood pressure control 2. No further changes in treatment for heart failure currently stable 3. High intensity cholesterol therapy for coronary artery disease 4. Proceed to an   further intervention for sciatic and/or leg pain as necessary without restriction 5. No further cardiac diagnostics necessary at this time  Signed, Corey Skains M.D. [facility] 08/19/2016, 8:07 AM

## 2016-08-19 NOTE — Progress Notes (Signed)
Patient was discharged to [facility]. IV removed with cath intact. Reviewed scripts and last dose of meds taken with caregiver. Packed patient's belongs and packet was sent with caregiver.

## 2016-08-19 NOTE — NC FL2 (Signed)
[place] LEVEL OF CARE SCREENING TOOL     IDENTIFICATION  Patient Name: DESEREE ZEMAITIS Birthdate: 1961/03/15 Sex: female Admission Date (Current Location): 08/17/2016  [place] and [place] Number:  Selena Lesser  ([id number] [facility]) Facility and Address:  [facility], [address] [place], [place] [address]      Provider Number: [id number]  Attending Physician Name and Address:  Loletha Grayer, MD  Relative Name and Phone Number:       Current Level of Care: Hospital Recommended Level of Care: Other (Comment) (Group Home. [name] ) Prior Approval Number:    Date Approved/Denied:   PASRR Number:    Discharge Plan: Domiciliary (Rest home) [name] )    Current Diagnoses: Patient Active Problem List   Diagnosis Date Noted  . Altered mental state 08/17/2016  . DDD (degenerative disc disease), lumbar 07/09/2015  . 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 Lumbar radiculopathy 07/09/2015  . Sacroiliac joint dysfunction 07/09/2015  . Spinal stenosis, lumbar region, 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  . Diabetic neuropathy ([place]) 07/09/2015  . CAD in native artery 06/16/2015  . Controlled type 2 diabetes mellitus without complication ([place]) 66/29/4765  . Benign essential HTN 06/16/2015  . Obstructive apnea 01/05/2015  . Esophagitis, reflux 08/06/2014  . Edema leg 05/14/2014  . Chronic systolic heart failure ([place]) 05/14/2014  . TI (tricuspid incompetence) 05/14/2014  . Chest pain 12/13/2013  . Combined fat and carbohydrate induced hyperlipemia 10/09/2013  . Breath shortness 10/09/2013    Orientation RESPIRATION BLADDER Height & Weight     Self  Normal Continent Weight: 203 lb 12.8 oz (92.4 kg) Height:  5\' 7"  (170.2 cm)  BEHAVIORAL SYMPTOMS/MOOD NEUROLOGICAL BOWEL NUTRITION STATUS   (none)  (none) Continent Regular Diet   AMBULATORY STATUS COMMUNICATION OF NEEDS Skin   Supervision Verbally Normal                        Personal Care Assistance Level of Assistance  Bathing, Feeding, Dressing Bathing Assistance: Limited assistance Feeding assistance: Independent Dressing Assistance: Limited assistance     Functional Limitations Info  Sight, Hearing, Speech Sight Info: Adequate Hearing Info: Adequate Speech Info: Adequate    SPECIAL CARE FACTORS FREQUENCY  PT (By licensed PT)     PT Frequency:  (home health 2-3 days per week. )              Contractures      Additional Factors Info  Code Status, Allergies Code Status Info:  (Full Code. ) Allergies Info:  (No Known Allergies. )          Discharge Medications: Please see discharge summary for a list of discharge medications. DISCHARGE MEDICATIONS:       Current Discharge Medication List        START taking these medications   Details  traZODone (DESYREL) 50 MG tablet Take 1 tablet (50 mg total) by mouth at bedtime. Qty: 30 tablet, Refills: 0          CONTINUE these medications which have CHANGED   Details  carvedilol (COREG) 6.25 MG tablet Take 1 tablet (6.25 mg total) by mouth 2 (two) times daily with a meal. Qty: 60 tablet, Refills: 0    gabapentin (NEURONTIN) 100 MG capsule Take 1 capsule (100 mg total) by mouth 3 (three) times daily. Qty: 90 capsule, Refills: 0          CONTINUE these medications which have NOT CHANGED   Details  ARIPiprazole (ABILIFY) 30 MG tablet Take 30 mg by mouth every morning.     aspirin 81 MG tablet Take 81 mg by mouth daily.    atorvastatin (LIPITOR) 20 MG tablet Take 20 mg by mouth every evening.    benztropine (COGENTIN) 0.5 MG tablet Take 0.5 mg by mouth 2 (two) times daily.    clotrimazole (LOTRIMIN) 1 % cream Apply 1 application topically 2 (two) times daily.    docusate sodium (COLACE) 100 MG capsule Take 100 mg by mouth 2 (two) times daily.    FLUPHENAZINE DECANOATE IJ Inject 25 mg as directed. INJECT INTO THE MUSCLE EVERY TWO WEEKS    levothyroxine  (SYNTHROID, LEVOTHROID) 75 MCG tablet Take 75 mcg by mouth daily before breakfast.    lithium carbonate (LITHOBID) 300 MG CR tablet Take by mouth every 12 (twelve) hours.    metFORMIN (GLUCOPHAGE-XR) 500 MG 24 hr tablet Take 1,000 mg by mouth daily with breakfast.    mupirocin ointment (BACTROBAN) 2 % Apply 1 application topically 2 (two) times daily. Qty: 30 g, Refills: 0    naproxen (NAPROSYN) 500 MG tablet Take 500 mg by mouth 2 (two) times daily with a meal.    omeprazole (PRILOSEC) 40 MG capsule Take 40 mg by mouth daily.    pioglitazone (ACTOS) 15 MG tablet Take 15 mg by mouth daily.    polyethylene glycol powder (MIRALAX) powder Take 1 Container by mouth once. 17 G OF POWDER , 1 HEAPING TABLESPOON DISSOLVED IN 8 OZ OF WATER    senna (SENOKOT) 8.6 MG tablet Take 1 tablet by mouth daily.     sertraline (ZOLOFT) 100 MG tablet Take 100 mg by mouth daily.    acetaminophen (TYLENOL) 325 MG tablet Take 650 mg by mouth every 4 (four) hours as needed for moderate pain, fever or headache. 2 TABLETS BY MOUTH EVERY FOUR HOURS AS NEEDED    dextromethorphan-guaiFENesin (TUSSIN DM) 10-100 MG/5ML liquid Take 10 mLs by mouth every 6 (six) hours as needed for cough.         STOP taking these medications     NITROGLYCERIN SL       Relevant Imaging Results: Relevant Lab Results: Additional Information  (SSN: [id number])  Laird Runnion, Veronia Beets, LCSW

## 2016-09-22 ENCOUNTER — Ambulatory Visit: Payer: Medicare Other | Admitting: Podiatry

## 2016-10-03 ENCOUNTER — Encounter: Payer: Self-pay | Admitting: Podiatry

## 2016-10-03 ENCOUNTER — Ambulatory Visit (INDEPENDENT_AMBULATORY_CARE_PROVIDER_SITE_OTHER): Payer: Medicare Other | Admitting: Podiatry

## 2016-10-03 DIAGNOSIS — B351 Tinea unguium: Secondary | ICD-10-CM

## 2016-10-03 DIAGNOSIS — M79676 Pain in unspecified toe(s): Secondary | ICD-10-CM | POA: Diagnosis not present

## 2016-10-03 NOTE — Progress Notes (Signed)
Complaint:  Visit Type: Patient returns to my office for continued preventative foot care services. Complaint: Patient states" my nails have grown long and thick and become painful to walk and wear shoes" Patient has been diagnosed with DM with no foot complications. The patient presents for preventative foot care services. No changes to ROS  Podiatric Exam: Vascular: dorsalis pedis and posterior tibial pulses are palpable bilateral. Capillary return is immediate. Temperature gradient is WNL. Skin turgor WNL  Sensorium: Normal Semmes Weinstein monofilament test. Normal tactile sensation bilaterally. Nail Exam: Pt has thick disfigured discolored nails with subungual debris noted bilateral entire nail hallux through fifth toenails Ulcer Exam: There is no evidence of ulcer or pre-ulcerative changes or infection. Orthopedic Exam: Muscle tone and strength are WNL. No limitations in general ROM. No crepitus or effusions noted. Foot type and digits show no abnormalities. Bony prominences are unremarkable. Swollen right ankle medially and laterally with no pain. Skin: No Porokeratosis. No infection or ulcers  Diagnosis:  Onychomycosis, , Pain in right toe, pain in left toes,  Swollen ankle  Treatment & Plan Procedures and Treatment: Consent by patient was obtained for treatment procedures. The patient understood the discussion of treatment and procedures well. All questions were answered thoroughly reviewed. Debridement of mycotic and hypertrophic toenails, 1 through 5 bilateral and clearing of subungual debris. No ulceration, no infection noted.  Return Visit-Office Procedure: Patient instructed to return to the office for a follow up visit 3 months for continued evaluation and treatment.    Geremiah Fussell DPM 

## 2016-11-18 ENCOUNTER — Other Ambulatory Visit: Payer: Self-pay | Admitting: Family Medicine

## 2016-11-18 DIAGNOSIS — Z1231 Encounter for screening mammogram for malignant neoplasm of breast: Secondary | ICD-10-CM

## 2016-12-27 ENCOUNTER — Ambulatory Visit
Admission: RE | Admit: 2016-12-27 | Discharge: 2016-12-27 | Disposition: A | Discharge status: Home / Self Care | Payer: Medicare Other | Source: Ambulatory Visit | Attending: Family Medicine | Admitting: Family Medicine

## 2016-12-27 DIAGNOSIS — Z1231 Encounter for screening mammogram for malignant neoplasm of breast: Secondary | ICD-10-CM | POA: Insufficient documentation

## 2017-01-02 ENCOUNTER — Ambulatory Visit (INDEPENDENT_AMBULATORY_CARE_PROVIDER_SITE_OTHER): Payer: Medicare Other | Admitting: Podiatry

## 2017-01-02 DIAGNOSIS — B351 Tinea unguium: Secondary | ICD-10-CM

## 2017-01-02 DIAGNOSIS — E1149 Type 2 diabetes mellitus with other diabetic neurological complication: Secondary | ICD-10-CM

## 2017-01-02 DIAGNOSIS — M79676 Pain in unspecified toe(s): Secondary | ICD-10-CM | POA: Diagnosis not present

## 2017-01-02 DIAGNOSIS — M25471 Effusion, right ankle: Secondary | ICD-10-CM

## 2017-01-02 NOTE — Progress Notes (Signed)
Complaint:  Visit Type: Patient returns to my office for continued preventative foot care services. Complaint: Patient states" my nails have grown long and thick and become painful to walk and wear shoes" Patient has been diagnosed with DM with no foot complications. The patient presents for preventative foot care services. No changes to ROS  Podiatric Exam: Vascular: dorsalis pedis and posterior tibial pulses are palpable bilateral. Capillary return is immediate. Temperature gradient is WNL. Skin turgor WNL  Sensorium: Normal Semmes Weinstein monofilament test. Normal tactile sensation bilaterally. Nail Exam: Pt has thick disfigured discolored nails with subungual debris noted bilateral entire nail hallux through fifth toenails Ulcer Exam: There is no evidence of ulcer or pre-ulcerative changes or infection. Orthopedic Exam: Muscle tone and strength are WNL. No limitations in general ROM. No crepitus or effusions noted. Foot type and digits show no abnormalities. Bony prominences are unremarkable. Swollen right ankle medially and laterally with no pain. Skin: No Porokeratosis. No infection or ulcers  Diagnosis:  Onychomycosis, , Pain in right toe, pain in left toes,  Swollen ankle  Treatment & Plan Procedures and Treatment: Consent by patient was obtained for treatment procedures. The patient understood the discussion of treatment and procedures well. All questions were answered thoroughly reviewed. Debridement of mycotic and hypertrophic toenails, 1 through 5 bilateral and clearing of subungual debris. No ulceration, no infection noted.  Return Visit-Office Procedure: Patient instructed to return to the office for a follow up visit 3 months for continued evaluation and treatment.    Gardiner Barefoot DPM

## 2017-04-06 ENCOUNTER — Ambulatory Visit: Payer: Medicare Other | Admitting: Podiatry

## 2017-04-13 ENCOUNTER — Encounter: Payer: Self-pay | Admitting: Podiatry

## 2017-04-13 ENCOUNTER — Ambulatory Visit (INDEPENDENT_AMBULATORY_CARE_PROVIDER_SITE_OTHER): Payer: Medicare Other | Admitting: Podiatry

## 2017-04-13 DIAGNOSIS — M79676 Pain in unspecified toe(s): Secondary | ICD-10-CM | POA: Diagnosis not present

## 2017-04-13 DIAGNOSIS — E1149 Type 2 diabetes mellitus with other diabetic neurological complication: Secondary | ICD-10-CM

## 2017-04-13 DIAGNOSIS — D1723 Benign lipomatous neoplasm of skin and subcutaneous tissue of right leg: Secondary | ICD-10-CM

## 2017-04-13 DIAGNOSIS — B351 Tinea unguium: Secondary | ICD-10-CM | POA: Diagnosis not present

## 2017-04-13 DIAGNOSIS — M25471 Effusion, right ankle: Secondary | ICD-10-CM

## 2017-04-13 NOTE — Progress Notes (Signed)
Complaint:  Visit Type: Patient returns to my office for continued preventative foot care services. Complaint: Patient states" my nails have grown long and thick and become painful to walk and wear shoes" Patient has been diagnosed with DM with no foot complications. The patient presents for preventative foot care services. No changes to ROS  Podiatric Exam: Vascular: dorsalis pedis and posterior tibial pulses are palpable bilateral. Capillary return is immediate. Temperature gradient is WNL. Skin turgor WNL  Sensorium: Normal Semmes Weinstein monofilament test. Normal tactile sensation bilaterally. Nail Exam: Pt has thick disfigured discolored nails with subungual debris noted bilateral entire nail hallux through fifth toenails Ulcer Exam: There is no evidence of ulcer or pre-ulcerative changes or infection. Orthopedic Exam: Muscle tone and strength are WNL. No limitations in general ROM. No crepitus or effusions noted. Foot type and digits show no abnormalities. Bony prominences are unremarkable. Swollen right ankle medially and laterally with no pain. Skin: No Porokeratosis. No infection or ulcers  Diagnosis:  Onychomycosis, , Pain in right toe, pain in left toes,  Swollen ankle  Treatment & Plan Procedures and Treatment: Consent by patient was obtained for treatment procedures. The patient understood the discussion of treatment and procedures well. All questions were answered thoroughly reviewed. Debridement of mycotic and hypertrophic toenails, 1 through 5 bilateral and clearing of subungual debris. No ulceration, no infection noted.  Return Visit-Office Procedure: Patient instructed to return to the office for a follow up visit 3 months for continued evaluation and treatment.    Gardiner Barefoot DPM

## 2017-05-01 DIAGNOSIS — J3089 Other allergic rhinitis: Secondary | ICD-10-CM | POA: Insufficient documentation

## 2017-07-13 ENCOUNTER — Encounter: Payer: Self-pay | Admitting: Podiatry

## 2017-07-13 ENCOUNTER — Ambulatory Visit (INDEPENDENT_AMBULATORY_CARE_PROVIDER_SITE_OTHER): Payer: Medicare Other | Admitting: Podiatry

## 2017-07-13 DIAGNOSIS — B351 Tinea unguium: Secondary | ICD-10-CM

## 2017-07-13 DIAGNOSIS — M79676 Pain in unspecified toe(s): Secondary | ICD-10-CM

## 2017-07-13 DIAGNOSIS — E1149 Type 2 diabetes mellitus with other diabetic neurological complication: Secondary | ICD-10-CM

## 2017-07-13 NOTE — Progress Notes (Signed)
Complaint:  Visit Type: Patient returns to my office for continued preventative foot care services. Complaint: Patient states" my nails have grown long and thick and become painful to walk and wear shoes" Patient has been diagnosed with DM with no foot complications. The patient presents for preventative foot care services. No changes to ROS.  She says her big toenail left big toe has come off and her nail big toe right is loose but not painful.  Podiatric Exam: Vascular: dorsalis pedis and posterior tibial pulses are palpable bilateral. Capillary return is immediate. Temperature gradient is WNL. Skin turgor WNL  Sensorium: Normal Semmes Weinstein monofilament test. Normal tactile sensation bilaterally. Nail Exam: Pt has thick disfigured discolored nails with subungual debris noted bilateral entire nail hallux through fifth toenails Ulcer Exam: There is no evidence of ulcer or pre-ulcerative changes or infection. Orthopedic Exam: Muscle tone and strength are WNL. No limitations in general ROM. No crepitus or effusions noted. Foot type and digits show no abnormalities. Bony prominences are unremarkable.  Skin: No Porokeratosis. No infection or ulcers  Diagnosis:  Onychomycosis, , Pain in right toe, pain in left toes,    Treatment & Plan Procedures and Treatment: Consent by patient was obtained for treatment procedures. The patient understood the discussion of treatment and procedures well. All questions were answered thoroughly reviewed. Debridement of mycotic and hypertrophic toenails, 1 through 5 bilateral and clearing of subungual debris. No ulceration, no infection noted. Debrided hallus with dremel tool and she had difficulty tolerating the dremel tool.  Continue to watch right hallux nail. Return Visit-Office Procedure: Patient instructed to return to the office for a follow up visit 3 months for continued evaluation and treatment.    Gardiner Barefoot DPM

## 2017-08-29 ENCOUNTER — Ambulatory Visit
Admission: RE | Admit: 2017-08-29 | Discharge: 2017-08-29 | Disposition: A | Discharge status: Home / Self Care | Payer: Medicare Other | Source: Ambulatory Visit | Attending: Family Medicine | Admitting: Family Medicine

## 2017-08-29 ENCOUNTER — Other Ambulatory Visit: Payer: Self-pay | Admitting: Family Medicine

## 2017-08-29 DIAGNOSIS — R52 Pain, unspecified: Secondary | ICD-10-CM

## 2017-08-29 DIAGNOSIS — M79671 Pain in right foot: Secondary | ICD-10-CM | POA: Diagnosis present

## 2017-10-12 ENCOUNTER — Ambulatory Visit: Payer: Medicare Other | Admitting: Podiatry

## 2017-10-23 ENCOUNTER — Ambulatory Visit (INDEPENDENT_AMBULATORY_CARE_PROVIDER_SITE_OTHER): Payer: Medicare Other | Admitting: Podiatry

## 2017-10-23 ENCOUNTER — Encounter: Payer: Self-pay | Admitting: Podiatry

## 2017-10-23 DIAGNOSIS — B351 Tinea unguium: Secondary | ICD-10-CM

## 2017-10-23 DIAGNOSIS — M79676 Pain in unspecified toe(s): Secondary | ICD-10-CM

## 2017-10-23 DIAGNOSIS — E1149 Type 2 diabetes mellitus with other diabetic neurological complication: Secondary | ICD-10-CM

## 2017-10-23 NOTE — Progress Notes (Addendum)
Complaint:  Visit Type: Patient returns to my office for continued preventative foot care services. Complaint: Patient states" my nails have grown long and thick and become painful to walk and wear shoes" Patient has been diagnosed with DM with no foot complications. The patient presents for preventative foot care services. No changes to ROS.    Podiatric Exam: Vascular: dorsalis pedis and posterior tibial pulses are palpable bilateral. Capillary return is immediate. Temperature gradient is WNL. Skin turgor WNL  Sensorium: Normal Semmes Weinstein monofilament test. Normal tactile sensation bilaterally. Nail Exam: Pt has thick disfigured discolored nails with subungual debris noted bilateral entire nail hallux through fifth toenails Ulcer Exam: There is no evidence of ulcer or pre-ulcerative changes or infection. Orthopedic Exam: Muscle tone and strength are WNL. No limitations in general ROM. No crepitus or effusions noted. Foot type and digits show no abnormalities. Bony prominences are unremarkable.  Skin: No Porokeratosis. No infection or ulcers  Diagnosis:  Onychomycosis, , Pain in right toe, pain in left toes,    Treatment & Plan Procedures and Treatment: Consent by patient was obtained for treatment procedures. The patient understood the discussion of treatment and procedures well. All questions were answered thoroughly reviewed. Debridement of mycotic and hypertrophic toenails, 1 through 5 bilateral and clearing of subungual debris. No ulceration, no infection noted. Debrided hallux nails without dremel.  RTC 4 months. Patient does not qualify for diabetic shoes. ABN signed for 2019. Return Visit-Office Procedure: Patient instructed to return to the office for a follow up visit 4 months for continued evaluation and treatment.    Gardiner Barefoot DPM

## 2018-02-07 ENCOUNTER — Other Ambulatory Visit: Payer: Self-pay | Admitting: Family Medicine

## 2018-02-07 DIAGNOSIS — Z1231 Encounter for screening mammogram for malignant neoplasm of breast: Secondary | ICD-10-CM

## 2018-02-19 ENCOUNTER — Ambulatory Visit: Payer: Medicare Other | Admitting: Podiatry

## 2018-02-22 ENCOUNTER — Ambulatory Visit: Payer: Medicare Other | Admitting: Podiatry

## 2018-02-23 ENCOUNTER — Ambulatory Visit
Admission: RE | Admit: 2018-02-23 | Discharge: 2018-02-23 | Disposition: A | Discharge status: Home / Self Care | Payer: Medicare Other | Source: Ambulatory Visit | Attending: Family Medicine | Admitting: Family Medicine

## 2018-02-23 DIAGNOSIS — Z1231 Encounter for screening mammogram for malignant neoplasm of breast: Secondary | ICD-10-CM | POA: Diagnosis not present

## 2018-05-28 ENCOUNTER — Encounter: Payer: Self-pay | Admitting: Podiatry

## 2018-06-11 ENCOUNTER — Ambulatory Visit: Payer: Medicare Other | Admitting: Podiatry

## 2019-01-21 ENCOUNTER — Other Ambulatory Visit: Payer: Self-pay

## 2019-01-21 ENCOUNTER — Encounter: Payer: Self-pay | Admitting: Podiatry

## 2019-01-21 ENCOUNTER — Ambulatory Visit (INDEPENDENT_AMBULATORY_CARE_PROVIDER_SITE_OTHER): Payer: Medicare Other | Admitting: Podiatry

## 2019-01-21 DIAGNOSIS — E1149 Type 2 diabetes mellitus with other diabetic neurological complication: Secondary | ICD-10-CM | POA: Diagnosis not present

## 2019-01-21 DIAGNOSIS — M79676 Pain in unspecified toe(s): Secondary | ICD-10-CM

## 2019-01-21 DIAGNOSIS — B351 Tinea unguium: Secondary | ICD-10-CM

## 2019-01-21 NOTE — Progress Notes (Signed)
Complaint:  Visit Type: Patient returns to my office for continued preventative foot care services. Complaint: Patient states" my nails have grown long and thick and become painful to walk and wear shoes" Patient has been diagnosed with DM with no foot complications. The patient presents for preventative foot care services. No changes to ROS.  Patient has not been seen in over one year.  Podiatric Exam: Vascular: dorsalis pedis and posterior tibial pulses are palpable bilateral. Capillary return is immediate. Temperature gradient is WNL. Skin turgor WNL  Sensorium: Normal Semmes Weinstein monofilament test. Normal tactile sensation bilaterally. Nail Exam: Pt has thick disfigured discolored nails with subungual debris noted bilateral entire nail hallux through fifth toenails Ulcer Exam: There is no evidence of ulcer or pre-ulcerative changes or infection. Orthopedic Exam: Muscle tone and strength are WNL. No limitations in general ROM. No crepitus or effusions noted. Foot type and digits show no abnormalities. Bony prominences are unremarkable.  Skin: No Porokeratosis. No infection or ulcers  Diagnosis:  Onychomycosis, , Pain in right toe, pain in left toes,    Treatment & Plan Procedures and Treatment: Consent by patient was obtained for treatment procedures. The patient understood the discussion of treatment and procedures well. All questions were answered thoroughly reviewed. Debridement of mycotic and hypertrophic toenails, 1 through 5 bilateral and clearing of subungual debris. No ulceration, no infection noted. Return Visit-Office Procedure: Patient instructed to return to the office for a follow up visit 4 months for continued evaluation and treatment.    Gardiner Barefoot DPM

## 2019-01-22 ENCOUNTER — Other Ambulatory Visit: Payer: Self-pay | Admitting: Family Medicine

## 2019-01-22 DIAGNOSIS — Z1231 Encounter for screening mammogram for malignant neoplasm of breast: Secondary | ICD-10-CM

## 2019-02-19 ENCOUNTER — Other Ambulatory Visit: Payer: Self-pay | Admitting: Physician Assistant

## 2019-02-19 ENCOUNTER — Other Ambulatory Visit (HOSPITAL_COMMUNITY): Payer: Self-pay | Admitting: Physician Assistant

## 2019-02-19 DIAGNOSIS — M79605 Pain in left leg: Secondary | ICD-10-CM

## 2019-02-22 ENCOUNTER — Other Ambulatory Visit: Payer: Self-pay

## 2019-02-22 ENCOUNTER — Ambulatory Visit
Admission: RE | Admit: 2019-02-22 | Discharge: 2019-02-22 | Disposition: A | Discharge status: Home / Self Care | Payer: Medicare Other | Source: Ambulatory Visit | Attending: Physician Assistant | Admitting: Physician Assistant

## 2019-02-22 DIAGNOSIS — M79605 Pain in left leg: Secondary | ICD-10-CM | POA: Diagnosis present

## 2019-02-28 ENCOUNTER — Ambulatory Visit
Admission: RE | Admit: 2019-02-28 | Discharge: 2019-02-28 | Disposition: A | Discharge status: Home / Self Care | Payer: Medicare Other | Source: Ambulatory Visit | Attending: Family Medicine | Admitting: Family Medicine

## 2019-02-28 DIAGNOSIS — Z1231 Encounter for screening mammogram for malignant neoplasm of breast: Secondary | ICD-10-CM | POA: Insufficient documentation

## 2019-03-20 ENCOUNTER — Emergency Department: Payer: Medicare Other

## 2019-03-20 ENCOUNTER — Other Ambulatory Visit: Payer: Self-pay

## 2019-03-20 ENCOUNTER — Observation Stay
Admission: EM | Admit: 2019-03-20 | Discharge: 2019-03-21 | Disposition: A | Discharge status: Nursing Home (Includes ICF) | Payer: Medicare Other | Attending: Family Medicine | Admitting: Family Medicine

## 2019-03-20 DIAGNOSIS — G9340 Encephalopathy, unspecified: Principal | ICD-10-CM | POA: Insufficient documentation

## 2019-03-20 DIAGNOSIS — I110 Hypertensive heart disease with heart failure: Secondary | ICD-10-CM | POA: Insufficient documentation

## 2019-03-20 DIAGNOSIS — E039 Hypothyroidism, unspecified: Secondary | ICD-10-CM | POA: Insufficient documentation

## 2019-03-20 DIAGNOSIS — F200 Paranoid schizophrenia: Secondary | ICD-10-CM | POA: Insufficient documentation

## 2019-03-20 DIAGNOSIS — E1142 Type 2 diabetes mellitus with diabetic polyneuropathy: Secondary | ICD-10-CM | POA: Diagnosis not present

## 2019-03-20 DIAGNOSIS — Z79899 Other long term (current) drug therapy: Secondary | ICD-10-CM | POA: Diagnosis not present

## 2019-03-20 DIAGNOSIS — G9341 Metabolic encephalopathy: Secondary | ICD-10-CM | POA: Diagnosis present

## 2019-03-20 DIAGNOSIS — Z7984 Long term (current) use of oral hypoglycemic drugs: Secondary | ICD-10-CM | POA: Diagnosis not present

## 2019-03-20 DIAGNOSIS — Z20828 Contact with and (suspected) exposure to other viral communicable diseases: Secondary | ICD-10-CM | POA: Insufficient documentation

## 2019-03-20 DIAGNOSIS — R55 Syncope and collapse: Secondary | ICD-10-CM | POA: Diagnosis present

## 2019-03-20 DIAGNOSIS — Z87891 Personal history of nicotine dependence: Secondary | ICD-10-CM | POA: Diagnosis not present

## 2019-03-20 DIAGNOSIS — F79 Unspecified intellectual disabilities: Secondary | ICD-10-CM | POA: Diagnosis not present

## 2019-03-20 DIAGNOSIS — K219 Gastro-esophageal reflux disease without esophagitis: Secondary | ICD-10-CM | POA: Diagnosis not present

## 2019-03-20 DIAGNOSIS — Z79890 Hormone replacement therapy (postmenopausal): Secondary | ICD-10-CM | POA: Insufficient documentation

## 2019-03-20 DIAGNOSIS — I252 Old myocardial infarction: Secondary | ICD-10-CM | POA: Insufficient documentation

## 2019-03-20 DIAGNOSIS — E119 Type 2 diabetes mellitus without complications: Secondary | ICD-10-CM

## 2019-03-20 DIAGNOSIS — I5022 Chronic systolic (congestive) heart failure: Secondary | ICD-10-CM | POA: Diagnosis present

## 2019-03-20 DIAGNOSIS — I2510 Atherosclerotic heart disease of native coronary artery without angina pectoris: Secondary | ICD-10-CM | POA: Diagnosis not present

## 2019-03-20 DIAGNOSIS — R4182 Altered mental status, unspecified: Secondary | ICD-10-CM | POA: Diagnosis not present

## 2019-03-20 DIAGNOSIS — I5042 Chronic combined systolic (congestive) and diastolic (congestive) heart failure: Secondary | ICD-10-CM | POA: Diagnosis present

## 2019-03-20 DIAGNOSIS — Z7982 Long term (current) use of aspirin: Secondary | ICD-10-CM | POA: Diagnosis not present

## 2019-03-20 DIAGNOSIS — E1140 Type 2 diabetes mellitus with diabetic neuropathy, unspecified: Secondary | ICD-10-CM | POA: Diagnosis present

## 2019-03-20 DIAGNOSIS — E785 Hyperlipidemia, unspecified: Secondary | ICD-10-CM | POA: Insufficient documentation

## 2019-03-20 LAB — SALICYLATE LEVEL: Salicylate Lvl: <7 mg/dL — ABNORMAL LOW (ref 7.0–30.0)

## 2019-03-20 LAB — COMPREHENSIVE METABOLIC PANEL
ALT: 8 U/L (ref 0–44)
AST: 24 U/L (ref 15–41)
Albumin: 3.2 g/dL — ABNORMAL LOW (ref 3.5–5.0)
Alkaline Phosphatase: 77 U/L (ref 38–126)
Anion gap: 7 (ref 5–15)
BUN: 18 mg/dL (ref 6–20)
CO2: 24 mmol/L (ref 22–32)
Calcium: 9.6 mg/dL (ref 8.9–10.3)
Chloride: 107 mmol/L (ref 98–111)
Creatinine, Ser: 1.12 mg/dL — ABNORMAL HIGH (ref 0.44–1.00)
GFR calc Af Amer: >60 mL/min (ref >60)
GFR calc non Af Amer: 54 mL/min — ABNORMAL LOW (ref >60)
Glucose, Bld: 199 mg/dL — ABNORMAL HIGH (ref 70–99)
Potassium: 5.5 mmol/L — ABNORMAL HIGH (ref 3.5–5.1)
Sodium: 138 mmol/L (ref 135–145)
Total Bilirubin: 1.3 mg/dL — ABNORMAL HIGH (ref 0.3–1.2)
Total Protein: 6.5 g/dL (ref 6.5–8.1)

## 2019-03-20 LAB — URINALYSIS, COMPLETE (UACMP) WITH MICROSCOPIC
Bacteria, UA: NONE SEEN
Bilirubin Urine: NEGATIVE
Glucose, UA: NEGATIVE mg/dL
Hgb urine dipstick: NEGATIVE
Ketones, ur: NEGATIVE mg/dL
Leukocytes,Ua: NEGATIVE
Nitrite: NEGATIVE
Protein, ur: NEGATIVE mg/dL
Specific Gravity, Urine: 1.014 (ref 1.005–1.030)
Squamous Epithelial / HPF: NONE SEEN (ref 0–5)
pH: 7 (ref 5.0–8.0)

## 2019-03-20 LAB — CBC
HCT: 35.2 % — ABNORMAL LOW (ref 36.0–46.0)
Hemoglobin: 10.6 g/dL — ABNORMAL LOW (ref 12.0–15.0)
MCH: 26.7 pg (ref 26.0–34.0)
MCHC: 30.1 g/dL (ref 30.0–36.0)
MCV: 88.7 fL (ref 80.0–100.0)
Platelets: 322 10*3/uL (ref 150–400)
RBC: 3.97 MIL/uL (ref 3.87–5.11)
RDW: 15.4 % (ref 11.5–15.5)
WBC: 10.5 10*3/uL (ref 4.0–10.5)
nRBC: 0 % (ref 0.0–0.2)

## 2019-03-20 LAB — BLOOD GAS, VENOUS
Acid-Base Excess: 1.2 mmol/L (ref 0.0–2.0)
Bicarbonate: 29.6 mmol/L — ABNORMAL HIGH (ref 20.0–28.0)
O2 Saturation: 34 %
Patient temperature: 37
pCO2, Ven: 63 mmHg — ABNORMAL HIGH (ref 44.0–60.0)
pH, Ven: 7.28 (ref 7.250–7.430)

## 2019-03-20 LAB — RESPIRATORY PANEL BY RT PCR (FLU A&B, COVID)
Influenza A by PCR: NEGATIVE
Influenza B by PCR: NEGATIVE
SARS Coronavirus 2 by RT PCR: NEGATIVE

## 2019-03-20 LAB — URINE DRUG SCREEN, QUALITATIVE (ARMC ONLY)
Amphetamines, Ur Screen: NOT DETECTED
Barbiturates, Ur Screen: NOT DETECTED
Benzodiazepine, Ur Scrn: NOT DETECTED
Cannabinoid 50 Ng, Ur ~~LOC~~: NOT DETECTED
Cocaine Metabolite,Ur ~~LOC~~: NOT DETECTED
MDMA (Ecstasy)Ur Screen: NOT DETECTED
Methadone Scn, Ur: NOT DETECTED
Opiate, Ur Screen: NOT DETECTED
Phencyclidine (PCP) Ur S: NOT DETECTED
Tricyclic, Ur Screen: NOT DETECTED

## 2019-03-20 LAB — DIFFERENTIAL
Abs Immature Granulocytes: 0.06 10*3/uL (ref 0.00–0.07)
Basophils Absolute: 0 10*3/uL (ref 0.0–0.1)
Basophils Relative: 0 %
Eosinophils Absolute: 0.2 10*3/uL (ref 0.0–0.5)
Eosinophils Relative: 1 %
Immature Granulocytes: 1 %
Lymphocytes Relative: 13 %
Lymphs Abs: 1.4 10*3/uL (ref 0.7–4.0)
Monocytes Absolute: 0.5 10*3/uL (ref 0.1–1.0)
Monocytes Relative: 5 %
Neutro Abs: 8.4 10*3/uL — ABNORMAL HIGH (ref 1.7–7.7)
Neutrophils Relative %: 80 %

## 2019-03-20 LAB — PROTIME-INR
INR: 1 (ref 0.8–1.2)
Prothrombin Time: 12.8 seconds (ref 11.4–15.2)

## 2019-03-20 LAB — POC SARS CORONAVIRUS 2 AG: SARS Coronavirus 2 Ag: NEGATIVE

## 2019-03-20 LAB — GLUCOSE, CAPILLARY
Glucose-Capillary: 179 mg/dL — ABNORMAL HIGH (ref 70–99)
Glucose-Capillary: 110 mg/dL — ABNORMAL HIGH (ref 70–99)
Glucose-Capillary: 97 mg/dL (ref 70–99)

## 2019-03-20 LAB — ETHANOL: Alcohol, Ethyl (B): <10 mg/dL (ref <10)

## 2019-03-20 LAB — ACETAMINOPHEN LEVEL: Acetaminophen (Tylenol), Serum: <10 ug/mL — ABNORMAL LOW (ref 10–30)

## 2019-03-20 LAB — APTT: aPTT: <24 seconds — ABNORMAL LOW (ref 24–36)

## 2019-03-20 MED ORDER — LABETALOL HCL 5 MG/ML IV SOLN
INTRAVENOUS | Status: AC
  Filled 2019-03-20: qty 4

## 2019-03-20 MED ORDER — INSULIN ASPART 100 UNIT/ML ~~LOC~~ SOLN
0.0000 [IU] | Freq: Four times a day (QID) | SUBCUTANEOUS | Status: DC
  Administered 2019-03-21: 2 [IU] via SUBCUTANEOUS
  Filled 2019-03-20: qty 1

## 2019-03-20 MED ORDER — SODIUM CHLORIDE 0.9 % IV SOLN: INTRAVENOUS | Status: DC

## 2019-03-20 MED ORDER — ACETAMINOPHEN 325 MG PO TABS: 650.0000 mg | ORAL_TABLET | ORAL | Status: DC | PRN

## 2019-03-20 MED ORDER — LEVOTHYROXINE SODIUM 100 MCG/5ML IV SOLN
50.0000 ug | Freq: Every day | INTRAVENOUS | Status: DC
  Administered 2019-03-20 – 2019-03-21 (×2): 50 ug via INTRAVENOUS
  Filled 2019-03-20 (×2): qty 5

## 2019-03-20 MED ORDER — ALTEPLASE 100 MG IV SOLR
INTRAVENOUS | Status: AC
  Filled 2019-03-20: qty 100

## 2019-03-20 MED ORDER — ACETAMINOPHEN 160 MG/5ML PO SOLN
650.0000 mg | ORAL | Status: DC | PRN
  Filled 2019-03-20: qty 20.3

## 2019-03-20 MED ORDER — STROKE: EARLY STAGES OF RECOVERY BOOK: Freq: Once | Status: DC

## 2019-03-20 MED ORDER — ACETAMINOPHEN 650 MG RE SUPP: 650.0000 mg | RECTAL | Status: DC | PRN

## 2019-03-20 MED ORDER — IOHEXOL 350 MG/ML SOLN
100.0000 mL | Freq: Once | INTRAVENOUS | Status: AC | PRN
  Administered 2019-03-20: 100 mL via INTRAVENOUS

## 2019-03-20 NOTE — ED Notes (Signed)
This RN spoke with group home coordinator, Benjamine Mola. She stated staff noticed this morning pt was drinking coffee and seemed a little off. Pt stating she felt more tired. Facility staff did CBG check at 0715 and pt became unresponsive shortly after.   Elizabeth Gutierrez [phone]

## 2019-03-20 NOTE — Procedures (Signed)
ELECTROENCEPHALOGRAM REPORT   Patient: Elizabeth Gutierrez       Room #: [id number] EEG No. ID: [id number] Age: 58 y.o.        Sex: female Referring Physician: Sarajane Jews Report Date:  03/20/2019        Interpreting Physician: Alexis Goodell  History: Elizabeth Gutierrez is an 58 y.o. female presenting unresponsive  Medications:  Lithium, Synthroid, Insulin   Conditions of Recording:  This is a 21 channel routine scalp EEG performed with bipolar and monopolar montages arranged in accordance to the international 10/20 system of electrode placement. One channel was dedicated to EKG recording.  The patient is in the awake and drowsy states.  Description:  Artifact is prominent during the recording often obscuring the background rhythm. When able to be visualized the waking background activity consists of a low voltage, symmetrical, fairly well organized, 8 Hz alpha activity, seen from the parieto-occipital and posterior temporal regions.  Low voltage fast activity, poorly organized, is seen anteriorly and is at times superimposed on more posterior regions.  A mixture of theta and alpha rhythms are seen from the central and temporal regions. The patient drowses with slowing to irregular, low voltage theta and beta activity.   Stage II sleep is not obtained. No epileptiform activity is noted.   Hyperventilation was not performed. Intermittent photic stimulation was performed but failed to illicit any change in the tracing.    IMPRESSION: Normal electroencephalogram, awake, drowsy and with activation procedures. There are no focal lateralizing or epileptiform features.   Alexis Goodell, MD Neurology [phone] 03/20/2019, 7:03 PM

## 2019-03-20 NOTE — ED Notes (Signed)
Code  Stroke  Called  To [phone] 

## 2019-03-20 NOTE — ED Notes (Signed)
CBG 110, fluids administered per MD order. Pt noted to be alert, however speaking slurred nonsense at this time, pt continues to have 1:1 safety sitter at this time.

## 2019-03-20 NOTE — ED Notes (Signed)
This RN to bedside at this time, pt continues to have 1:1 sitter. Pt began getting agitated with this RN speaking, and began incoherently yelling, this RN once again reiterated with patient that she could not speak to staff that way or yell at staff, pt then proceeded to plug her ears with her fingers until this RN exited room.

## 2019-03-20 NOTE — ED Notes (Signed)
Pt's daughter called & was updated on pt's current condition. Daughter was encouraged to call if she had any additional questions or concerns.

## 2019-03-20 NOTE — ED Notes (Addendum)
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

## 2019-03-20 NOTE — ED Provider Notes (Signed)
[facility] Emergency Department Provider Note  ____________________________________________  Time seen: Approximately 11:24 AM  I have reviewed the triage vital signs and the nursing notes.   HISTORY  Chief Complaint Loss of Consciousness    Level 5 Caveat: Portions of the History and Physical including HPI and review of systems are unable to be completely obtained due to patient being a poor historian   HPI Elizabeth Gutierrez is a 58 y.o. female with a history of CAD CHF diabetes hypertension schizophrenia who was sent to the ED from her group home by EMS.  This morning she was checked on at 7 AM, had a blood sugar check which was around 170, patient seem to be in her usual state of health.  At some point later, she was noted to be unresponsive, slumped over.  EMS reported blood sugar of 200, initial hypotension with a systolic blood pressure in the 80s.  They gave 500 mL IV fluid bolus with improvement to [id number] systolic.  Patient is not able to provide any history relate any symptoms.  No further information relayed by group home through EMS.  No known overdoses, patient's not on opioids.      Past Medical History:  Diagnosis Date  . CHF (congestive heart failure) ([place])   . Coronary artery disease   . Diabetes ([place])   . Heart attack ([place])   . Hyperlipidemia   . Hypertension   . MR (mental retardation)    Mild  . Paranoid schizophrenia ([place])   . Thyroid disease      Patient Active Problem List   Diagnosis Date Noted  . Acute encephalopathy 03/20/2019  . Altered mental state 08/17/2016  . DDD (degenerative disc disease), lumbar 07/09/2015  . Facet syndrome, lumbar 07/09/2015  . Lumbar radiculopathy 07/09/2015  . Sacroiliac joint dysfunction 07/09/2015  . Spinal stenosis, lumbar region, 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  . Diabetic neuropathy ([place]) 07/09/2015  . CAD in native artery 06/16/2015  . Controlled type 2 diabetes mellitus without  complication ([place]) 0000000  . Benign essential HTN 06/16/2015  . Obstructive apnea 01/05/2015  . Esophagitis, reflux 08/06/2014  . Edema leg 05/14/2014  . Chronic systolic heart failure ([place]) 05/14/2014  . TI (tricuspid incompetence) 05/14/2014  . Chest pain 12/13/2013  . Combined fat and carbohydrate induced hyperlipemia 10/09/2013  . Breath shortness 10/09/2013     Past Surgical History:  Procedure Laterality Date  . FOOT SURGERY Right      Prior to Admission medications   Medication Sig Start Date End Date Taking? Authorizing Provider  acetaminophen (TYLENOL) 325 MG tablet Take 650 mg by mouth every 4 (four) hours as needed for moderate pain, fever or headache.    Yes [provider]  ARIPiprazole (ABILIFY) 30 MG tablet Take 30 mg by mouth daily.    Yes [provider]  aspirin 81 MG tablet Take 81 mg by mouth daily.   Yes [provider]  atorvastatin (LIPITOR) 20 MG tablet Take 20 mg by mouth at bedtime.    Yes [provider]  benztropine (COGENTIN) 0.5 MG tablet Take 0.5 mg by mouth 2 (two) times daily.   Yes [provider]  carvedilol (COREG) 3.125 MG tablet Take 3.125 mg by mouth 2 (two) times daily. 12/26/18  Yes [provider]  dextromethorphan-guaiFENesin (TUSSIN DM) 10-100 MG/5ML liquid Take 10 mLs by mouth every 6 (six) hours as needed for cough.   Yes [provider]  docusate sodium (COLACE) 100 MG  capsule Take 100 mg by mouth 2 (two) times daily as needed for mild constipation.    Yes [provider]  fluPHENAZine decanoate (PROLIXIN) 25 MG/ML injection Inject 31.25 mg into the muscle every 14 (fourteen) days.    Yes [provider]  fluticasone (FLONASE) 50 MCG/ACT nasal spray Place 2 sprays into both nostrils daily.    Yes [provider]  gabapentin (NEURONTIN) 100 MG capsule Take 200 mg by mouth 3 (three) times daily.   Yes [provider]  levothyroxine  (SYNTHROID, LEVOTHROID) 75 MCG tablet Take 75 mcg by mouth daily before breakfast.   Yes [provider]  lithium carbonate (LITHOBID) 300 MG CR tablet Take 300 mg by mouth every 12 (twelve) hours.    Yes [provider]  metFORMIN (GLUCOPHAGE-XR) 500 MG 24 hr tablet Take 1,000 mg by mouth daily with breakfast.   Yes [provider]  naproxen (NAPROSYN) 500 MG tablet Take 500 mg by mouth 2 (two) times daily with a meal.   Yes [provider]  nystatin cream (MYCOSTATIN) Apply 1 application topically 4 (four) times daily.    Yes [provider]  omeprazole (PRILOSEC) 20 MG capsule Take 20 mg by mouth 2 (two) times daily.  12/26/18  Yes [provider]  pioglitazone (ACTOS) 15 MG tablet Take 15 mg by mouth daily.   Yes [provider]  polyethylene glycol (MIRALAX) 17 g packet Take 17 g by mouth daily.    Yes [provider]  senna (SENOKOT) 8.6 MG tablet Take 1 tablet by mouth at bedtime.    Yes [provider]  sertraline (ZOLOFT) 100 MG tablet Take 100 mg by mouth daily.   Yes [provider]  traZODone (DESYREL) 50 MG tablet Take 1 tablet (50 mg total) by mouth at bedtime. 08/19/16  Yes Elizabeth Grayer, MD     Allergies Patient has no known allergies.   Family History  Problem Relation Age of Onset  . Alcohol abuse Father   . Breast cancer Neg Hx     Social History Social History   Tobacco Use  . Smoking status: Former Smoker    Quit date: 07/08/2013    Years since quitting: 5.7  . Smokeless tobacco: Former Network engineer Use Topics  . Alcohol use: No    Alcohol/week: 0.0 standard drinks  . Drug use: No    Review of Systems Level 5 Caveat: Portions of the History and Physical including HPI and review of systems are unable to be completely obtained due to patient being a poor historian   Constitutional:   No known fever.  ENT:   No rhinorrhea. Cardiovascular:   No chest pain or  syncope. Respiratory:   No dyspnea or cough. Gastrointestinal:   Negative for abdominal pain, vomiting and diarrhea.  Musculoskeletal:   Negative for focal pain or swelling ____________________________________________   PHYSICAL EXAM:  VITAL SIGNS: ED Triage Vitals [03/20/19 1107]  Enc Vitals Group     BP      Pulse Rate 69     Resp 17     Temp      Temp src      SpO2 100 %     Weight      Height      Head Circumference      Peak Flow      Pain Score      Pain Loc      Pain Edu?  Excl. in [place]?     Vital signs reviewed, nursing assessments reviewed.   Constitutional: Obtunded.  Ill-appearing, not in respiratory distress Eyes:   Conjunctivae are normal. EOMI. PERRL. ENT      Head:   Normocephalic and atraumatic.      Nose:   No congestion/rhinnorhea.       Mouth/Throat:   MMM, no pharyngeal erythema. No peritonsillar mass.  Positive gag reflex, protecting airway      Neck:   No meningismus. Full ROM. Hematological/Lymphatic/Immunilogical:   No cervical lymphadenopathy. Cardiovascular:   RRR. Symmetric bilateral radial and DP pulses.  No murmurs. Cap refill less than 2 seconds. Respiratory:   Normal respiratory effort without tachypnea/retractions. Breath sounds are clear and equal bilaterally. No wheezes/rales/rhonchi. Gastrointestinal:   Soft and nontender. Non distended. There is no CVA tenderness.  No rebound, rigidity, or guarding.  Musculoskeletal:   Normal range of motion in all extremities. No joint effusions.  No lower extremity tenderness.  No edema. Neurologic:   Mumbling garbled speech.  Incoherent language..  Motor grossly intact, moves all extremities.  GCS = E1V2M4 Skin:    Skin is warm, dry and intact. No rash noted.  No petechiae, purpura, or bullae.  ____________________________________________    LABS (pertinent positives/negatives) (all labs ordered are listed, but only abnormal results are displayed) Labs Reviewed  APTT - Abnormal; Notable  for the following components:      Result Value   aPTT <24 (*)    All other components within normal limits  CBC - Abnormal; Notable for the following components:   Hemoglobin 10.6 (*)    HCT 35.2 (*)    All other components within normal limits  DIFFERENTIAL - Abnormal; Notable for the following components:   Neutro Abs 8.4 (*)    All other components within normal limits  COMPREHENSIVE METABOLIC PANEL - Abnormal; Notable for the following components:   Potassium 5.5 (*)    Glucose, Bld 199 (*)    Creatinine, Ser 1.12 (*)    Albumin 3.2 (*)    Total Bilirubin 1.3 (*)    GFR calc non Af Amer 54 (*)    All other components within normal limits  ACETAMINOPHEN LEVEL - Abnormal; Notable for the following components:   Acetaminophen (Tylenol), Serum <10 (*)    All other components within normal limits  SALICYLATE LEVEL - Abnormal; Notable for the following components:   Salicylate Lvl [id number] (*)    All other components within normal limits  BLOOD GAS, VENOUS - Abnormal; Notable for the following components:   pCO2, Ven 63 (*)    Bicarbonate 29.6 (*)    All other components within normal limits  GLUCOSE, CAPILLARY - Abnormal; Notable for the following components:   Glucose-Capillary 179 (*)    All other components within normal limits  RESPIRATORY PANEL BY RT PCR (FLU A&B, COVID)  ETHANOL  PROTIME-INR  URINE DRUG SCREEN, QUALITATIVE ([facility] ONLY)  URINALYSIS, COMPLETE (UACMP) WITH MICROSCOPIC  LITHIUM LEVEL  POC SARS CORONAVIRUS 2 AG -  ED  POC SARS CORONAVIRUS 2 AG   ____________________________________________   EKG  Interpreted by me Sinus rhythm rate of 68, left axis, normal intervals.  Poor R wave progression.  Normal ST segments and T waves.  No acute ischemic changes.  ____________________________________________    RADIOLOGY  CT ANGIO HEAD W OR WO CONTRAST  Result Date: 03/20/2019 CLINICAL DATA:  Stroke, follow-up. EXAM: CT ANGIOGRAPHY HEAD AND NECK CT PERFUSION  BRAIN TECHNIQUE: Multidetector CT imaging  of the head and neck was performed using the standard protocol during bolus administration of intravenous contrast. Multiplanar CT image reconstructions and MIPs were obtained to evaluate the vascular anatomy. Carotid stenosis measurements (when applicable) are obtained utilizing NASCET criteria, using the distal internal carotid diameter as the denominator. Multiphase CT imaging of the brain was performed following IV bolus contrast injection. Subsequent parametric perfusion maps were calculated using RAPID software. CONTRAST:  133mL OMNIPAQUE IOHEXOL 350 MG/ML SOLN COMPARISON:  Noncontrast head CT performed earlier the same day FINDINGS: CT HEAD FINDINGS Brain: No evidence of acute intracranial hemorrhage. No demarcated cortical infarction. No evidence of intracranial mass. No midline shift or extra-axial fluid collection. Mild ill-defined hypoattenuation within the cerebral white matter is nonspecific, but consistent with chronic small vessel ischemic disease. Cerebral volume is normal for age. Vascular: Reported separately Skull: Normal. Negative for fracture or focal lesion. Sinuses/Orbits: Visualized orbits demonstrate no acute abnormality. Small mucous retention cyst within the inferior left maxillary sinus. No significant mastoid effusion. ASPECTS ([place] Stroke Program Early CT Score) - Ganglionic level infarction (caudate, lentiform nuclei, internal capsule, insula, M1-M3 cortex): 7 - Supraganglionic infarction (M4-M6 cortex): 3 Total score (0-10 with 10 being normal): 10 Review of the MIP images confirms the above findings CTA NECK FINDINGS Aortic arch: Standard aortic branching. Right carotid system: CCA and ICA patent within the neck without stenosis. No significant atherosclerotic disease. Left carotid system: CCA and ICA patent within the neck without stenosis. Vertebral arteries: The left vertebral artery is slightly dominant. The vertebral arteries are  patent within the neck bilaterally without significant stenosis. Skeleton: No acute bony abnormality. Other neck: Prominent thyroid gland. There is a nodule within the left thyroid lobe measuring 1.2 cm. There also scattered coarse calcifications within the thyroid gland. Borderline enlarged left level III lymph node measuring 10 mm in short axis, nonspecific (series 8, image 196) Upper chest: Patchy atelectasis within the imaged lung apices. Review of the MIP images confirms the above findings CTA HEAD FINDINGS Anterior circulation: The intracranial internal carotid arteries are patent with scattered calcified plaque but no significant stenosis. The bilateral middle and anterior cerebral arteries are patent without significant proximal stenosis. No intracranial aneurysm is identified. Posterior circulation: The intracranial vertebral arteries are patent without significant stenosis. The non dominant right vertebral artery is developmentally diminutive beyond the origin of the right PICA. The basilar artery is patent without significant stenosis. The posterior cerebral arteries are patent bilaterally without significant proximal stenosis. Small bilateral posterior communicating arteries are present. Venous sinuses: Within limitations of contrast timing, no convincing thrombus. Anatomic variants: None significant Review of the MIP images confirms the above findings CT Brain Perfusion Findings: ASPECTS: 10 CBF (<30%) Volume: 71mL Perfusion (Tmax>6.0s) volume: 35mL Mismatch Volume: None identified Infarction Location:None These results were called by telephone at the time of interpretation on 03/20/2019 at 1:47 pm to provider Dr. Joni Fears, who verbally acknowledged these results. IMPRESSION: CT head: 1. No evidence of acute intracranial abnormality. 2. Mild chronic small vessel ischemic disease. CTA neck: 1. The common carotid, internal carotid and vertebral arteries are patent within the neck without significant  stenosis. No significant atherosclerotic disease. 2. Borderline enlarged left level III lymph node, nonspecific. Clinical correlation is recommended. 3. Enlarged thyroid gland containing scattered coarse calcifications. 1.2 cm left thyroid lobe nodule. CTA head: No intracranial large vessel occlusion or proximal high-grade arterial stenosis. CT perfusion head: No core infarct is identified by the perfusion software. Additionally, the perfusion software demonstrates no region of critically  hypoperfused parenchyma utilizing the a Tmax>6 seconds threshold. Electronically Signed   By: Kellie Simmering DO   On: 03/20/2019 13:47   CT ANGIO NECK W OR WO CONTRAST  Result Date: 03/20/2019 CLINICAL DATA:  Stroke, follow-up. EXAM: CT ANGIOGRAPHY HEAD AND NECK CT PERFUSION BRAIN TECHNIQUE: Multidetector CT imaging of the head and neck was performed using the standard protocol during bolus administration of intravenous contrast. Multiplanar CT image reconstructions and MIPs were obtained to evaluate the vascular anatomy. Carotid stenosis measurements (when applicable) are obtained utilizing NASCET criteria, using the distal internal carotid diameter as the denominator. Multiphase CT imaging of the brain was performed following IV bolus contrast injection. Subsequent parametric perfusion maps were calculated using RAPID software. CONTRAST:  17mL OMNIPAQUE IOHEXOL 350 MG/ML SOLN COMPARISON:  Noncontrast head CT performed earlier the same day FINDINGS: CT HEAD FINDINGS Brain: No evidence of acute intracranial hemorrhage. No demarcated cortical infarction. No evidence of intracranial mass. No midline shift or extra-axial fluid collection. Mild ill-defined hypoattenuation within the cerebral white matter is nonspecific, but consistent with chronic small vessel ischemic disease. Cerebral volume is normal for age. Vascular: Reported separately Skull: Normal. Negative for fracture or focal lesion. Sinuses/Orbits: Visualized orbits  demonstrate no acute abnormality. Small mucous retention cyst within the inferior left maxillary sinus. No significant mastoid effusion. ASPECTS ([place] Stroke Program Early CT Score) - Ganglionic level infarction (caudate, lentiform nuclei, internal capsule, insula, M1-M3 cortex): 7 - Supraganglionic infarction (M4-M6 cortex): 3 Total score (0-10 with 10 being normal): 10 Review of the MIP images confirms the above findings CTA NECK FINDINGS Aortic arch: Standard aortic branching. Right carotid system: CCA and ICA patent within the neck without stenosis. No significant atherosclerotic disease. Left carotid system: CCA and ICA patent within the neck without stenosis. Vertebral arteries: The left vertebral artery is slightly dominant. The vertebral arteries are patent within the neck bilaterally without significant stenosis. Skeleton: No acute bony abnormality. Other neck: Prominent thyroid gland. There is a nodule within the left thyroid lobe measuring 1.2 cm. There also scattered coarse calcifications within the thyroid gland. Borderline enlarged left level III lymph node measuring 10 mm in short axis, nonspecific (series 8, image 196) Upper chest: Patchy atelectasis within the imaged lung apices. Review of the MIP images confirms the above findings CTA HEAD FINDINGS Anterior circulation: The intracranial internal carotid arteries are patent with scattered calcified plaque but no significant stenosis. The bilateral middle and anterior cerebral arteries are patent without significant proximal stenosis. No intracranial aneurysm is identified. Posterior circulation: The intracranial vertebral arteries are patent without significant stenosis. The non dominant right vertebral artery is developmentally diminutive beyond the origin of the right PICA. The basilar artery is patent without significant stenosis. The posterior cerebral arteries are patent bilaterally without significant proximal stenosis. Small bilateral  posterior communicating arteries are present. Venous sinuses: Within limitations of contrast timing, no convincing thrombus. Anatomic variants: None significant Review of the MIP images confirms the above findings CT Brain Perfusion Findings: ASPECTS: 10 CBF (<30%) Volume: 47mL Perfusion (Tmax>6.0s) volume: 62mL Mismatch Volume: None identified Infarction Location:None These results were called by telephone at the time of interpretation on 03/20/2019 at 1:47 pm to provider Dr. Joni Fears, who verbally acknowledged these results. IMPRESSION: CT head: 1. No evidence of acute intracranial abnormality. 2. Mild chronic small vessel ischemic disease. CTA neck: 1. The common carotid, internal carotid and vertebral arteries are patent within the neck without significant stenosis. No significant atherosclerotic disease. 2. Borderline enlarged left level III lymph  node, nonspecific. Clinical correlation is recommended. 3. Enlarged thyroid gland containing scattered coarse calcifications. 1.2 cm left thyroid lobe nodule. CTA head: No intracranial large vessel occlusion or proximal high-grade arterial stenosis. CT perfusion head: No core infarct is identified by the perfusion software. Additionally, the perfusion software demonstrates no region of critically hypoperfused parenchyma utilizing the a Tmax>6 seconds threshold. Electronically Signed   By: Kellie Simmering DO   On: 03/20/2019 13:47   CT CEREBRAL PERFUSION W CONTRAST  Result Date: 03/20/2019 CLINICAL DATA:  Stroke, follow-up. EXAM: CT ANGIOGRAPHY HEAD AND NECK CT PERFUSION BRAIN TECHNIQUE: Multidetector CT imaging of the head and neck was performed using the standard protocol during bolus administration of intravenous contrast. Multiplanar CT image reconstructions and MIPs were obtained to evaluate the vascular anatomy. Carotid stenosis measurements (when applicable) are obtained utilizing NASCET criteria, using the distal internal carotid diameter as the denominator.  Multiphase CT imaging of the brain was performed following IV bolus contrast injection. Subsequent parametric perfusion maps were calculated using RAPID software. CONTRAST:  124mL OMNIPAQUE IOHEXOL 350 MG/ML SOLN COMPARISON:  Noncontrast head CT performed earlier the same day FINDINGS: CT HEAD FINDINGS Brain: No evidence of acute intracranial hemorrhage. No demarcated cortical infarction. No evidence of intracranial mass. No midline shift or extra-axial fluid collection. Mild ill-defined hypoattenuation within the cerebral white matter is nonspecific, but consistent with chronic small vessel ischemic disease. Cerebral volume is normal for age. Vascular: Reported separately Skull: Normal. Negative for fracture or focal lesion. Sinuses/Orbits: Visualized orbits demonstrate no acute abnormality. Small mucous retention cyst within the inferior left maxillary sinus. No significant mastoid effusion. ASPECTS ([place] Stroke Program Early CT Score) - Ganglionic level infarction (caudate, lentiform nuclei, internal capsule, insula, M1-M3 cortex): 7 - Supraganglionic infarction (M4-M6 cortex): 3 Total score (0-10 with 10 being normal): 10 Review of the MIP images confirms the above findings CTA NECK FINDINGS Aortic arch: Standard aortic branching. Right carotid system: CCA and ICA patent within the neck without stenosis. No significant atherosclerotic disease. Left carotid system: CCA and ICA patent within the neck without stenosis. Vertebral arteries: The left vertebral artery is slightly dominant. The vertebral arteries are patent within the neck bilaterally without significant stenosis. Skeleton: No acute bony abnormality. Other neck: Prominent thyroid gland. There is a nodule within the left thyroid lobe measuring 1.2 cm. There also scattered coarse calcifications within the thyroid gland. Borderline enlarged left level III lymph node measuring 10 mm in short axis, nonspecific (series 8, image 196) Upper chest: Patchy  atelectasis within the imaged lung apices. Review of the MIP images confirms the above findings CTA HEAD FINDINGS Anterior circulation: The intracranial internal carotid arteries are patent with scattered calcified plaque but no significant stenosis. The bilateral middle and anterior cerebral arteries are patent without significant proximal stenosis. No intracranial aneurysm is identified. Posterior circulation: The intracranial vertebral arteries are patent without significant stenosis. The non dominant right vertebral artery is developmentally diminutive beyond the origin of the right PICA. The basilar artery is patent without significant stenosis. The posterior cerebral arteries are patent bilaterally without significant proximal stenosis. Small bilateral posterior communicating arteries are present. Venous sinuses: Within limitations of contrast timing, no convincing thrombus. Anatomic variants: None significant Review of the MIP images confirms the above findings CT Brain Perfusion Findings: ASPECTS: 10 CBF (<30%) Volume: 70mL Perfusion (Tmax>6.0s) volume: 61mL Mismatch Volume: None identified Infarction Location:None These results were called by telephone at the time of interpretation on 03/20/2019 at 1:47 pm to provider Dr. Joni Fears, who  verbally acknowledged these results. IMPRESSION: CT head: 1. No evidence of acute intracranial abnormality. 2. Mild chronic small vessel ischemic disease. CTA neck: 1. The common carotid, internal carotid and vertebral arteries are patent within the neck without significant stenosis. No significant atherosclerotic disease. 2. Borderline enlarged left level III lymph node, nonspecific. Clinical correlation is recommended. 3. Enlarged thyroid gland containing scattered coarse calcifications. 1.2 cm left thyroid lobe nodule. CTA head: No intracranial large vessel occlusion or proximal high-grade arterial stenosis. CT perfusion head: No core infarct is identified by the perfusion  software. Additionally, the perfusion software demonstrates no region of critically hypoperfused parenchyma utilizing the a Tmax>6 seconds threshold. Electronically Signed   By: Kellie Simmering DO   On: 03/20/2019 13:47   DG Chest Portable 1 View  Result Date: 03/20/2019 CLINICAL DATA:  Patient found unresponsive today. EXAM: PORTABLE CHEST 1 VIEW COMPARISON:  Single-view of the chest 08/17/2016. FINDINGS: There is cardiomegaly with mild vascular congestion. No consolidative process, pneumothorax or pleural effusion. No acute or focal bony abnormality. IMPRESSION: Cardiomegaly and pulmonary vascular congestion. Electronically Signed   By: Inge Rise M.D.   On: 03/20/2019 12:57   CT HEAD CODE STROKE WO CONTRAST  Result Date: 03/20/2019 CLINICAL DATA:  Code stroke.  Found unresponsive. EXAM: CT HEAD WITHOUT CONTRAST TECHNIQUE: Contiguous axial images were obtained from the base of the skull through the vertex without intravenous contrast. COMPARISON:  None. FINDINGS: Brain: There is no acute intracranial hemorrhage, mass effect, or edema. Gray-white differentiation is preserved. Patchy hypoattenuation in the supratentorial white matter is nonspecific but may reflect mild chronic microvascular ischemic changes. There is no extra-axial fluid collection. Ventricles and sulci are normal in size and configuration. Vascular: There is no hyperdense vessel. Mild intracranial atherosclerotic calcification is present at the skull base. Skull: Unremarkable. Sinuses/Orbits: Unremarkable. Other: Mastoid air cells are clear. ASPECTS [facility] Stroke Program Early CT Score) - Ganglionic level infarction (caudate, lentiform nuclei, internal capsule, insula, M1-M3 cortex): 7 - Supraganglionic infarction (M4-M6 cortex): 3 Total score (0-10 with 10 being normal): 10 IMPRESSION: 1. No acute intracranial hemorrhage or evidence of acute infarction. Mild chronic microvascular ischemic changes. 2. ASPECTS is 10. These results  were communicated to Dr. Joni Fears At 11:39 amon 12/30/2020by text page via the [facility] messaging system. Electronically Signed   By: Macy Mis M.D.   On: 03/20/2019 11:42    ____________________________________________   PROCEDURES .Critical Care Performed by: Carrie Mew, MD Authorized by: Carrie Mew, MD   Critical care provider statement:    Critical care time (minutes):  35   Critical care time was exclusive of:  Separately billable procedures and treating other patients   Critical care was necessary to treat or prevent imminent or life-threatening deterioration of the following conditions:  CNS failure or compromise   Critical care was time spent personally by me on the following activities:  Development of treatment plan with patient or surrogate, discussions with consultants, evaluation of patient's response to treatment, examination of patient, obtaining history from patient or surrogate, ordering and performing treatments and interventions, ordering and review of laboratory studies, ordering and review of radiographic studies, pulse oximetry, re-evaluation of patient's condition and review of old charts    ____________________________________________  DIFFERENTIAL DIAGNOSIS   Intracranial hemorrhage, acute ischemic stroke, delirium, toxidrome, electrolyte abnormality, dehydration, hypercapnia  CLINICAL IMPRESSION / ASSESSMENT AND PLAN / ED COURSE  Medications ordered in the ED: Medications  iohexol (OMNIPAQUE) 350 MG/ML injection 100 mL (100 mLs Intravenous Contrast Given 03/20/19 1247)  Pertinent labs & imaging results that were available during my care of the patient were reviewed by me and considered in my medical decision making (see chart for details).   Elizabeth Gutierrez was evaluated in Emergency Department on 03/20/2019 for the symptoms described in the history of present illness. She was evaluated in the context of the global COVID-19 pandemic, which  necessitated consideration that the patient might be at risk for infection with the SARS-CoV-2 virus that causes COVID-19. Institutional protocols and algorithms that pertain to the evaluation of patients at risk for COVID-19 are in a state of rapid change based on information released by regulatory bodies including the CDC and federal and state organizations. These policies and algorithms were followed during the patient's care in the ED.   Patient presents with altered mental status.  She is maintaining her airway, does not require emergent intubation.  Last known well 7:15 AM.  Vital signs unremarkable on arrival to the ED.  Doubt sepsis initially.  Code stroke initiated, obtain CT head, chest x-ray, lab panel.  Clinical Course as of Mar 20 1431  Wed Mar 20, 2019  1134 CT head images viewed by me, no large acute infarct or intracranial hemorrhage.   [PS]  1138 Discussed with radiology who reports normal appearance of the CT head.   [PS]  [id number] D/w radiology - CTA / CTP negative for acute findings.   [PS]    Clinical Course User Index [PS] Carrie Mew, MD    ----------------------------------------- 2:31 PM on 03/20/2019 -----------------------------------------  Still no improvement in mental status.  Patient is still protecting airway.  Case discussed with hospitalist for further management of acute altered mental status.  With negative work-up so far for stroke imaging, neurology plans to facilitate EEG.  Covid PCR is negative.   ____________________________________________   FINAL CLINICAL IMPRESSION(S) / ED DIAGNOSES    Final diagnoses:  Encephalopathy acute     ED Discharge Orders    None      Portions of this note were generated with dragon dictation software. Dictation errors may occur despite best attempts at proofreading.   Carrie Mew, MD 03/20/19 [phone]

## 2019-03-20 NOTE — ED Notes (Signed)
Patient transported to CT 

## 2019-03-20 NOTE — ED Notes (Signed)
Pt transported to EEG 

## 2019-03-20 NOTE — ED Notes (Signed)
No change in patient condition. Pt continues to lie in bed, snoring respirations can be heard. Pt occasionally moans out. VSS. Pt remains on 3L via [place].

## 2019-03-20 NOTE — ED Notes (Signed)
Pt returned from EEG at this time.

## 2019-03-20 NOTE — ED Notes (Signed)
1:1 sitter at bedside due to patient repeatedly pulling monitoring equipment off and becoming agitated.

## 2019-03-20 NOTE — Consult Note (Signed)
Referring Physician: Joni Fears    Chief Complaint: Elizabeth Gutierrez  HPI: Elizabeth Gutierrez is an 58 y.o. female with a history of HTN, HLD and DM who is unable to provide any history due to mental status therefore all history obtained from the chart and ED provider.  This morning she was checked on at 7 AM, had a blood sugar check which was around 170, patient seem to be in her usual state of health.  At some point later, she was noted to be unresponsive, slumped over.  EMS reported blood sugar of 200, initial hypotension with a systolic blood pressure in the 80s.  They gave 500 mL IV fluid bolus with improvement to [id number] systolic.  Patient remained unresponsive on presentation and code stroke was called.  Initial NIHSS of 20.    Date last known well: Date: 03/20/2019 Time last known well: Time: 07:00 tPA Given: No: Outside time window  Past Medical History:  Diagnosis Date  . CHF (congestive heart failure) ([place])   . Coronary artery disease   . Diabetes ([place])   . Heart attack ([place])   . Hyperlipidemia   . Hypertension   . MR (mental retardation)    Mild  . Paranoid schizophrenia ([place])   . Thyroid disease     Past Surgical History:  Procedure Laterality Date  . FOOT SURGERY Right     Family History  Problem Relation Age of Onset  . Alcohol abuse Father   . Breast cancer Neg Hx    Social History:  reports that she quit smoking about 5 years ago. She has quit using smokeless tobacco. She reports that she does not drink alcohol or use drugs.  Allergies: No Known Allergies  Medications: I have reviewed the patient's current medications. Prior to Admission:  Prior to Admission medications   Medication Sig Start Date End Date Taking? Authorizing Provider  acetaminophen (TYLENOL) 325 MG tablet Take 650 mg by mouth every 4 (four) hours as needed for moderate pain, fever or headache.    Yes [provider]  ARIPiprazole (ABILIFY) 30 MG tablet Take 30 mg by mouth daily.    Yes [provider]  aspirin 81 MG tablet Take 81 mg by mouth daily.   Yes [provider]  atorvastatin (LIPITOR) 20 MG tablet Take 20 mg by mouth at bedtime.    Yes [provider]  benztropine (COGENTIN) 0.5 MG tablet Take 0.5 mg by mouth 2 (two) times daily.   Yes [provider]  carvedilol (COREG) 3.125 MG tablet Take 3.125 mg by mouth 2 (two) times daily. 12/26/18  Yes [provider]  dextromethorphan-guaiFENesin (TUSSIN DM) 10-100 MG/5ML liquid Take 10 mLs by mouth every 6 (six) hours as needed for cough.   Yes [provider]  docusate sodium (COLACE) 100 MG capsule Take 100 mg by mouth 2 (two) times daily as needed for mild constipation.    Yes [provider]  fluPHENAZine decanoate (PROLIXIN) 25 MG/ML injection Inject 31.25 mg into the muscle every 14 (fourteen) days.    Yes [provider]  fluticasone (FLONASE) 50 MCG/ACT nasal spray Place 2 sprays into both nostrils daily.    Yes [provider]  gabapentin (NEURONTIN) 100 MG capsule Take 200 mg by mouth 3 (three) times daily.   Yes [provider]  levothyroxine (SYNTHROID, LEVOTHROID) 75 MCG tablet Take 75 mcg by mouth daily before breakfast.   Yes [provider]  lithium carbonate (LITHOBID) 300 MG CR tablet Take  300 mg by mouth every 12 (twelve) hours.    Yes [provider]  metFORMIN (GLUCOPHAGE-XR) 500 MG 24 hr tablet Take 1,000 mg by mouth daily with breakfast.   Yes [provider]  naproxen (NAPROSYN) 500 MG tablet Take 500 mg by mouth 2 (two) times daily with a meal.   Yes [provider]  nystatin cream (MYCOSTATIN) Apply 1 application topically 4 (four) times daily.    Yes [provider]  omeprazole (PRILOSEC) 20 MG capsule Take 20 mg by mouth 2 (two) times daily.  12/26/18  Yes [provider]  pioglitazone (ACTOS) 15 MG tablet Take 15 mg by mouth daily.   Yes [provider]   polyethylene glycol (MIRALAX) 17 g packet Take 17 g by mouth daily.    Yes [provider]  senna (SENOKOT) 8.6 MG tablet Take 1 tablet by mouth at bedtime.    Yes [provider]  sertraline (ZOLOFT) 100 MG tablet Take 100 mg by mouth daily.   Yes [provider]  traZODone (DESYREL) 50 MG tablet Take 1 tablet (50 mg total) by mouth at bedtime. 08/19/16  Yes Wieting, Richard, MD      ROS: Unable to provide due to mental status  Physical Examination: Blood pressure 116/74, pulse 87, resp. rate 17, SpO2 97 %.  HEENT-  Normocephalic, no lesions, without obvious abnormality.  Normal external eye and conjunctiva.  Normal TM's bilaterally.  Normal auditory canals and external ears. Normal external nose, mucus membranes and septum.  Normal pharynx. Cardiovascular- S1, S2 normal, pulses palpable throughout   Lungs- chest clear, no wheezing, rales, normal symmetric air entry Abdomen- soft, non-tender; bowel sounds normal; no masses,  no organomegaly Extremities- no edema Lymph-no adenopathy palpable Musculoskeletal-no joint tenderness, deformity or swelling Skin-warm and dry, no hyperpigmentation, vitiligo, or suspicious lesions  Neurological Examination   Mental Status: Patient does not respond to verbal stimuli.  Localizes to pain bilaterally with deep sternal rub.  Does not follow commands.  No verbalizations noted on sonorous respirations.  Cranial Nerves: II: patient does not respond confrontation bilaterally, pupils right 3 mm, left 3 mm,and reactive bilaterally III,IV,VI: Oculocephalic response absent bilaterally. V,VII: corneal reflex present bilaterally  VIII: patient does not respond to verbal stimuli IX,X: gag reflex present, XI: trapezius strength unable to test bilaterally XII: tongue strength unable to test Motor: Moves all extremities to painful stimuli against gravity but does not maintain Sensory: Responds to noxious stimuli in all  extremities. Deep Tendon Reflexes:  Symmetric throughout. Plantars: Mute bilaterally Cerebellar: Unable to perform   Laboratory Studies:  Basic Metabolic Panel: Recent Labs  Lab 03/20/19 1110  NA 138  K 5.5*  CL 107  CO2 24  GLUCOSE 199*  BUN 18  CREATININE 1.12*  CALCIUM 9.6    Liver Function Tests: Recent Labs  Lab 03/20/19 1110  AST 24  ALT 8  ALKPHOS 77  BILITOT 1.3*  PROT 6.5  ALBUMIN 3.2*   No results for input(s): LIPASE, AMYLASE in the last 168 hours. No results for input(s): AMMONIA in the last 168 hours.  CBC: Recent Labs  Lab 03/20/19 1110  WBC 10.5  NEUTROABS 8.4*  HGB 10.6*  HCT 35.2*  MCV 88.7  PLT 322    Cardiac Enzymes: No results for input(s): CKTOTAL, CKMB, CKMBINDEX, TROPONINI in the last 168 hours.  BNP: Invalid input(s): POCBNP  CBG: Recent Labs  Lab 03/20/19 1122  GLUCAP 179*    Microbiology: Results for orders placed or  performed during the hospital encounter of 03/20/19  Respiratory Panel by RT PCR (Flu A&B, Covid) - Nasopharyngeal Swab     Status: None   Collection Time: 03/20/19 11:16 AM   Specimen: Nasopharyngeal Swab  Result Value Ref Range Status   SARS Coronavirus 2 by RT PCR NEGATIVE NEGATIVE Final    Comment: (NOTE) SARS-CoV-2 target nucleic acids are NOT DETECTED. The SARS-CoV-2 RNA is generally detectable in upper respiratoy specimens during the acute phase of infection. The lowest concentration of SARS-CoV-2 viral copies this assay can detect is 131 copies/mL. A negative result does not preclude SARS-Cov-2 infection and should not be used as the sole basis for treatment or other patient management decisions. A negative result may occur with  improper specimen collection/handling, submission of specimen other than nasopharyngeal swab, presence of viral mutation(s) within the areas targeted by this assay, and inadequate number of viral copies (<131 copies/mL). A negative result must be combined with  clinical observations, patient history, and epidemiological information. The expected result is Negative. Fact Sheet for Patients:  PinkCheek.be Fact Sheet for Healthcare Providers:  GravelBags.it This test is not yet ap proved or cleared by the [place] FDA and  has been authorized for detection and/or diagnosis of SARS-CoV-2 by FDA under an Emergency Use Authorization (EUA). This EUA will remain  in effect (meaning this test can be used) for the duration of the COVID-19 declaration under Section 564(b)(1) of the Act, 21 U.S.C. section 360bbb-3(b)(1), unless the authorization is terminated or revoked sooner.    Influenza A by PCR NEGATIVE NEGATIVE Final   Influenza B by PCR NEGATIVE NEGATIVE Final    Comment: (NOTE) The Xpert Xpress SARS-CoV-2/FLU/RSV assay is intended as an aid in  the diagnosis of influenza from Nasopharyngeal swab specimens and  should not be used as a sole basis for treatment. Nasal washings and  aspirates are unacceptable for Xpert Xpress SARS-CoV-2/FLU/RSV  testing. Fact Sheet for Patients: PinkCheek.be Fact Sheet for Healthcare Providers: GravelBags.it This test is not yet approved or cleared by the [place] FDA and  has been authorized for detection and/or diagnosis of SARS-CoV-2 by  FDA under an Emergency Use Authorization (EUA). This EUA will remain  in effect (meaning this test can be used) for the duration of the  Covid-19 declaration under Section 564(b)(1) of the Act, 21  U.S.C. section 360bbb-3(b)(1), unless the authorization is  terminated or revoked. Performed at [facility], [address] [place], [place] [address]     Coagulation Studies: Recent Labs    03/20/19 1110  LABPROT 12.8  INR 1.0    Urinalysis: No results for input(s): COLORURINE, LABSPEC, PHURINE, GLUCOSEU, HGBUR, BILIRUBINUR, KETONESUR,  PROTEINUR, UROBILINOGEN, NITRITE, LEUKOCYTESUR in the last 168 hours.  Invalid input(s): APPERANCEUR  Lipid Panel:    Component Value Date/Time   CHOL 146 04/07/2011 0634   TRIG 116 04/07/2011 0634   HDL 48 04/07/2011 0634   VLDL 23 04/07/2011 0634   LDLCALC 75 04/07/2011 0634    HgbA1C:  Lab Results  Component Value Date   HGBA1C 7.5 (H) 08/17/2016    Urine Drug Screen:      Component Value Date/Time   LABOPIA NONE DETECTED 08/17/2016 1008   COCAINSCRNUR NONE DETECTED 08/17/2016 1008   LABBENZ NONE DETECTED 08/17/2016 1008   AMPHETMU NONE DETECTED 08/17/2016 1008   THCU NONE DETECTED 08/17/2016 1008   LABBARB NONE DETECTED 08/17/2016 1008    Alcohol Level:  Recent Labs  Lab 03/20/19 1110  ETH <10  Other results: EKG: sinus rhythm at 68 bpm.  Imaging: CT ANGIO HEAD W OR WO CONTRAST  Result Date: 03/20/2019 CLINICAL DATA:  Stroke, follow-up. EXAM: CT ANGIOGRAPHY HEAD AND NECK CT PERFUSION BRAIN TECHNIQUE: Multidetector CT imaging of the head and neck was performed using the standard protocol during bolus administration of intravenous contrast. Multiplanar CT image reconstructions and MIPs were obtained to evaluate the vascular anatomy. Carotid stenosis measurements (when applicable) are obtained utilizing NASCET criteria, using the distal internal carotid diameter as the denominator. Multiphase CT imaging of the brain was performed following IV bolus contrast injection. Subsequent parametric perfusion maps were calculated using RAPID software. CONTRAST:  174mL OMNIPAQUE IOHEXOL 350 MG/ML SOLN COMPARISON:  Noncontrast head CT performed earlier the same day FINDINGS: CT HEAD FINDINGS Brain: No evidence of acute intracranial hemorrhage. No demarcated cortical infarction. No evidence of intracranial mass. No midline shift or extra-axial fluid collection. Mild ill-defined hypoattenuation within the cerebral white matter is nonspecific, but consistent with chronic small vessel  ischemic disease. Cerebral volume is normal for age. Vascular: Reported separately Skull: Normal. Negative for fracture or focal lesion. Sinuses/Orbits: Visualized orbits demonstrate no acute abnormality. Small mucous retention cyst within the inferior left maxillary sinus. No significant mastoid effusion. ASPECTS ([place] Stroke Program Early CT Score) - Ganglionic level infarction (caudate, lentiform nuclei, internal capsule, insula, M1-M3 cortex): 7 - Supraganglionic infarction (M4-M6 cortex): 3 Total score (0-10 with 10 being normal): 10 Review of the MIP images confirms the above findings CTA NECK FINDINGS Aortic arch: Standard aortic branching. Right carotid system: CCA and ICA patent within the neck without stenosis. No significant atherosclerotic disease. Left carotid system: CCA and ICA patent within the neck without stenosis. Vertebral arteries: The left vertebral artery is slightly dominant. The vertebral arteries are patent within the neck bilaterally without significant stenosis. Skeleton: No acute bony abnormality. Other neck: Prominent thyroid gland. There is a nodule within the left thyroid lobe measuring 1.2 cm. There also scattered coarse calcifications within the thyroid gland. Borderline enlarged left level III lymph node measuring 10 mm in short axis, nonspecific (series 8, image 196) Upper chest: Patchy atelectasis within the imaged lung apices. Review of the MIP images confirms the above findings CTA HEAD FINDINGS Anterior circulation: The intracranial internal carotid arteries are patent with scattered calcified plaque but no significant stenosis. The bilateral middle and anterior cerebral arteries are patent without significant proximal stenosis. No intracranial aneurysm is identified. Posterior circulation: The intracranial vertebral arteries are patent without significant stenosis. The non dominant right vertebral artery is developmentally diminutive beyond the origin of the right PICA.  The basilar artery is patent without significant stenosis. The posterior cerebral arteries are patent bilaterally without significant proximal stenosis. Small bilateral posterior communicating arteries are present. Venous sinuses: Within limitations of contrast timing, no convincing thrombus. Anatomic variants: None significant Review of the MIP images confirms the above findings CT Brain Perfusion Findings: ASPECTS: 10 CBF (<30%) Volume: 7mL Perfusion (Tmax>6.0s) volume: 39mL Mismatch Volume: None identified Infarction Location:None These results were called by telephone at the time of interpretation on 03/20/2019 at 1:47 pm to provider Dr. Joni Fears, who verbally acknowledged these results. IMPRESSION: CT head: 1. No evidence of acute intracranial abnormality. 2. Mild chronic small vessel ischemic disease. CTA neck: 1. The common carotid, internal carotid and vertebral arteries are patent within the neck without significant stenosis. No significant atherosclerotic disease. 2. Borderline enlarged left level III lymph node, nonspecific. Clinical correlation is recommended. 3. Enlarged thyroid gland containing scattered coarse calcifications.  1.2 cm left thyroid lobe nodule. CTA head: No intracranial large vessel occlusion or proximal high-grade arterial stenosis. CT perfusion head: No core infarct is identified by the perfusion software. Additionally, the perfusion software demonstrates no region of critically hypoperfused parenchyma utilizing the a Tmax>6 seconds threshold. Electronically Signed   By: Kellie Simmering DO   On: 03/20/2019 13:47   CT ANGIO NECK W OR WO CONTRAST  Result Date: 03/20/2019 CLINICAL DATA:  Stroke, follow-up. EXAM: CT ANGIOGRAPHY HEAD AND NECK CT PERFUSION BRAIN TECHNIQUE: Multidetector CT imaging of the head and neck was performed using the standard protocol during bolus administration of intravenous contrast. Multiplanar CT image reconstructions and MIPs were obtained to evaluate the  vascular anatomy. Carotid stenosis measurements (when applicable) are obtained utilizing NASCET criteria, using the distal internal carotid diameter as the denominator. Multiphase CT imaging of the brain was performed following IV bolus contrast injection. Subsequent parametric perfusion maps were calculated using RAPID software. CONTRAST:  136mL OMNIPAQUE IOHEXOL 350 MG/ML SOLN COMPARISON:  Noncontrast head CT performed earlier the same day FINDINGS: CT HEAD FINDINGS Brain: No evidence of acute intracranial hemorrhage. No demarcated cortical infarction. No evidence of intracranial mass. No midline shift or extra-axial fluid collection. Mild ill-defined hypoattenuation within the cerebral white matter is nonspecific, but consistent with chronic small vessel ischemic disease. Cerebral volume is normal for age. Vascular: Reported separately Skull: Normal. Negative for fracture or focal lesion. Sinuses/Orbits: Visualized orbits demonstrate no acute abnormality. Small mucous retention cyst within the inferior left maxillary sinus. No significant mastoid effusion. ASPECTS ([place] Stroke Program Early CT Score) - Ganglionic level infarction (caudate, lentiform nuclei, internal capsule, insula, M1-M3 cortex): 7 - Supraganglionic infarction (M4-M6 cortex): 3 Total score (0-10 with 10 being normal): 10 Review of the MIP images confirms the above findings CTA NECK FINDINGS Aortic arch: Standard aortic branching. Right carotid system: CCA and ICA patent within the neck without stenosis. No significant atherosclerotic disease. Left carotid system: CCA and ICA patent within the neck without stenosis. Vertebral arteries: The left vertebral artery is slightly dominant. The vertebral arteries are patent within the neck bilaterally without significant stenosis. Skeleton: No acute bony abnormality. Other neck: Prominent thyroid gland. There is a nodule within the left thyroid lobe measuring 1.2 cm. There also scattered coarse  calcifications within the thyroid gland. Borderline enlarged left level III lymph node measuring 10 mm in short axis, nonspecific (series 8, image 196) Upper chest: Patchy atelectasis within the imaged lung apices. Review of the MIP images confirms the above findings CTA HEAD FINDINGS Anterior circulation: The intracranial internal carotid arteries are patent with scattered calcified plaque but no significant stenosis. The bilateral middle and anterior cerebral arteries are patent without significant proximal stenosis. No intracranial aneurysm is identified. Posterior circulation: The intracranial vertebral arteries are patent without significant stenosis. The non dominant right vertebral artery is developmentally diminutive beyond the origin of the right PICA. The basilar artery is patent without significant stenosis. The posterior cerebral arteries are patent bilaterally without significant proximal stenosis. Small bilateral posterior communicating arteries are present. Venous sinuses: Within limitations of contrast timing, no convincing thrombus. Anatomic variants: None significant Review of the MIP images confirms the above findings CT Brain Perfusion Findings: ASPECTS: 10 CBF (<30%) Volume: 23mL Perfusion (Tmax>6.0s) volume: 46mL Mismatch Volume: None identified Infarction Location:None These results were called by telephone at the time of interpretation on 03/20/2019 at 1:47 pm to provider Dr. Joni Fears, who verbally acknowledged these results. IMPRESSION: CT head: 1. No evidence of acute  intracranial abnormality. 2. Mild chronic small vessel ischemic disease. CTA neck: 1. The common carotid, internal carotid and vertebral arteries are patent within the neck without significant stenosis. No significant atherosclerotic disease. 2. Borderline enlarged left level III lymph node, nonspecific. Clinical correlation is recommended. 3. Enlarged thyroid gland containing scattered coarse calcifications. 1.2 cm left thyroid  lobe nodule. CTA head: No intracranial large vessel occlusion or proximal high-grade arterial stenosis. CT perfusion head: No core infarct is identified by the perfusion software. Additionally, the perfusion software demonstrates no region of critically hypoperfused parenchyma utilizing the a Tmax>6 seconds threshold. Electronically Signed   By: Kellie Simmering DO   On: 03/20/2019 13:47   CT CEREBRAL PERFUSION W CONTRAST  Result Date: 03/20/2019 CLINICAL DATA:  Stroke, follow-up. EXAM: CT ANGIOGRAPHY HEAD AND NECK CT PERFUSION BRAIN TECHNIQUE: Multidetector CT imaging of the head and neck was performed using the standard protocol during bolus administration of intravenous contrast. Multiplanar CT image reconstructions and MIPs were obtained to evaluate the vascular anatomy. Carotid stenosis measurements (when applicable) are obtained utilizing NASCET criteria, using the distal internal carotid diameter as the denominator. Multiphase CT imaging of the brain was performed following IV bolus contrast injection. Subsequent parametric perfusion maps were calculated using RAPID software. CONTRAST:  126mL OMNIPAQUE IOHEXOL 350 MG/ML SOLN COMPARISON:  Noncontrast head CT performed earlier the same day FINDINGS: CT HEAD FINDINGS Brain: No evidence of acute intracranial hemorrhage. No demarcated cortical infarction. No evidence of intracranial mass. No midline shift or extra-axial fluid collection. Mild ill-defined hypoattenuation within the cerebral white matter is nonspecific, but consistent with chronic small vessel ischemic disease. Cerebral volume is normal for age. Vascular: Reported separately Skull: Normal. Negative for fracture or focal lesion. Sinuses/Orbits: Visualized orbits demonstrate no acute abnormality. Small mucous retention cyst within the inferior left maxillary sinus. No significant mastoid effusion. ASPECTS ([place] Stroke Program Early CT Score) - Ganglionic level infarction (caudate, lentiform  nuclei, internal capsule, insula, M1-M3 cortex): 7 - Supraganglionic infarction (M4-M6 cortex): 3 Total score (0-10 with 10 being normal): 10 Review of the MIP images confirms the above findings CTA NECK FINDINGS Aortic arch: Standard aortic branching. Right carotid system: CCA and ICA patent within the neck without stenosis. No significant atherosclerotic disease. Left carotid system: CCA and ICA patent within the neck without stenosis. Vertebral arteries: The left vertebral artery is slightly dominant. The vertebral arteries are patent within the neck bilaterally without significant stenosis. Skeleton: No acute bony abnormality. Other neck: Prominent thyroid gland. There is a nodule within the left thyroid lobe measuring 1.2 cm. There also scattered coarse calcifications within the thyroid gland. Borderline enlarged left level III lymph node measuring 10 mm in short axis, nonspecific (series 8, image 196) Upper chest: Patchy atelectasis within the imaged lung apices. Review of the MIP images confirms the above findings CTA HEAD FINDINGS Anterior circulation: The intracranial internal carotid arteries are patent with scattered calcified plaque but no significant stenosis. The bilateral middle and anterior cerebral arteries are patent without significant proximal stenosis. No intracranial aneurysm is identified. Posterior circulation: The intracranial vertebral arteries are patent without significant stenosis. The non dominant right vertebral artery is developmentally diminutive beyond the origin of the right PICA. The basilar artery is patent without significant stenosis. The posterior cerebral arteries are patent bilaterally without significant proximal stenosis. Small bilateral posterior communicating arteries are present. Venous sinuses: Within limitations of contrast timing, no convincing thrombus. Anatomic variants: None significant Review of the MIP images confirms the above findings CT Brain  Perfusion  Findings: ASPECTS: 10 CBF (<30%) Volume: 37mL Perfusion (Tmax>6.0s) volume: 63mL Mismatch Volume: None identified Infarction Location:None These results were called by telephone at the time of interpretation on 03/20/2019 at 1:47 pm to provider Dr. Joni Fears, who verbally acknowledged these results. IMPRESSION: CT head: 1. No evidence of acute intracranial abnormality. 2. Mild chronic small vessel ischemic disease. CTA neck: 1. The common carotid, internal carotid and vertebral arteries are patent within the neck without significant stenosis. No significant atherosclerotic disease. 2. Borderline enlarged left level III lymph node, nonspecific. Clinical correlation is recommended. 3. Enlarged thyroid gland containing scattered coarse calcifications. 1.2 cm left thyroid lobe nodule. CTA head: No intracranial large vessel occlusion or proximal high-grade arterial stenosis. CT perfusion head: No core infarct is identified by the perfusion software. Additionally, the perfusion software demonstrates no region of critically hypoperfused parenchyma utilizing the a Tmax>6 seconds threshold. Electronically Signed   By: Kellie Simmering DO   On: 03/20/2019 13:47   DG Chest Portable 1 View  Result Date: 03/20/2019 CLINICAL DATA:  Patient found unresponsive today. EXAM: PORTABLE CHEST 1 VIEW COMPARISON:  Single-view of the chest 08/17/2016. FINDINGS: There is cardiomegaly with mild vascular congestion. No consolidative process, pneumothorax or pleural effusion. No acute or focal bony abnormality. IMPRESSION: Cardiomegaly and pulmonary vascular congestion. Electronically Signed   By: Inge Rise M.D.   On: 03/20/2019 12:57   CT HEAD CODE STROKE WO CONTRAST  Result Date: 03/20/2019 CLINICAL DATA:  Code stroke.  Found unresponsive. EXAM: CT HEAD WITHOUT CONTRAST TECHNIQUE: Contiguous axial images were obtained from the base of the skull through the vertex without intravenous contrast. COMPARISON:  None. FINDINGS: Brain:  There is no acute intracranial hemorrhage, mass effect, or edema. Gray-white differentiation is preserved. Patchy hypoattenuation in the supratentorial white matter is nonspecific but may reflect mild chronic microvascular ischemic changes. There is no extra-axial fluid collection. Ventricles and sulci are normal in size and configuration. Vascular: There is no hyperdense vessel. Mild intracranial atherosclerotic calcification is present at the skull base. Skull: Unremarkable. Sinuses/Orbits: Unremarkable. Other: Mastoid air cells are clear. ASPECTS [facility] Stroke Program Early CT Score) - Ganglionic level infarction (caudate, lentiform nuclei, internal capsule, insula, M1-M3 cortex): 7 - Supraganglionic infarction (M4-M6 cortex): 3 Total score (0-10 with 10 being normal): 10 IMPRESSION: 1. No acute intracranial hemorrhage or evidence of acute infarction. Mild chronic microvascular ischemic changes. 2. ASPECTS is 10. These results were communicated to Dr. Joni Fears At 11:39 amon 12/30/2020by text page via the [facility] messaging system. Electronically Signed   By: Macy Mis M.D.   On: 03/20/2019 11:42    Assessment: 58 y.o. female  with a history of CAD CHF diabetes hypertension schizophrenia who was sent to the ED from her group home by EMS after being found unresponsive and hypotensive.  Code stroke called.  Initial Head CT reviewed and shows no acute changes.  CTA of the head/neck and CTP were ordered as well.  No evidence of large vessel occlusion or core infarct noted.  Etiology for poor responsiveness not likely ischemic in origin.  Will rule out other possibilities.    Stroke Risk Factors - diabetes mellitus, hyperlipidemia and hypertension  Plan: 1. EEG stat 2. UDS. Lithium level 3. Telemetry 4. Frequent neuro checks   Alexis Goodell, MD Neurology [phone] 03/20/2019, 2:07 PM

## 2019-03-20 NOTE — Progress Notes (Signed)
CODE STROKE- PHARMACY COMMUNICATION   Time CODE STROKE called/page received: 1130  Time response to CODE STROKE was made (in person or via phone): 1135  Time Stroke Kit retrieved from [facility] (only if needed): 1140 (returned)  Name of Provider/Nurse contacted: Dr. Doy Mince, pt outside of tPA window    Tawnya Crook ,PharmD Clinical Pharmacist  03/20/2019  11:45 AM

## 2019-03-20 NOTE — ED Notes (Signed)
Pt could be heard moaning from nurses station. Pt attempting to pull off covers. Pt still unable to open eyes. Speech is incomprehensible. Pt redirected to stay in bed. Pt oriented to place and situation. Will continue to round.

## 2019-03-20 NOTE — ED Notes (Signed)
CODE STROKE CALLED TO 333 

## 2019-03-20 NOTE — ED Notes (Signed)
This RN and Sherrie, NT repositioned pt in bed.

## 2019-03-20 NOTE — Progress Notes (Signed)
PT Cancellation Note  Patient Details Name: Elizabeth Gutierrez MRN: [id number] DOB: 1961/03/05   Cancelled Treatment:    Reason Eval/Treat Not Completed: Medical issues which prohibited therapy; Pt's Ka 5.5 falling outside guidelines for participation with PT services.  Will attempt to see pt at a future date/time as medically appropriate.     Linus Salmons PT, DPT 03/20/19, 4:11 PM

## 2019-03-20 NOTE — ED Notes (Signed)
Pt noted to be more alert, however remains confused, speech noted to be slurred and incomprehensible at this time. Pt noted to be throwing her leg out of bed, pulling on monitoring equipment.

## 2019-03-20 NOTE — ED Notes (Signed)
In and out cath performed by this RN and Jeanett Schlein, Therapist, sports. Pt repositioned in bed, purewick placed by this RN. Pt noted to intermittently awaken and groan, continues to respond to painful stimuli.

## 2019-03-20 NOTE — H&P (Signed)
History and Physical  Elizabeth Gutierrez [id number] DOB: 30-Jul-1960 DOA: 03/20/2019  PCP: Denton Lank, MD   Chief Complaint: Altered mental status  HPI:  58 year old woman PMH schizophrenia, CAD, diabetes, mental retardation, resides in a group home, who reportedly was well at 7 AM, had blood sugar around 170, and then later that morning was found to be unresponsive, slumped over.  EMS reported blood sugar 200, hypotensive in the 80s which resolved with fluid bolus.  The emergency department she was unresponsive and no further history was available.  She was treated as a code stroke and seen by neurology, exam was difficult, CT head and CT a head and neck were unrevealing.  Etiology for acute encephalopathy unclear.  Plans for for stat EEG, urine drug screen, lithium level and neurochecks.  Patient is awake but does not follow commands and her speech is unintelligible.  She is not able to offer any history.  I discussed with the group home representative throat confirms the above history, no fever and was doing well yesterday.  Chart review: . Hospitalized 2018 for altered mental status.  Diagnoses were insomnia.  ED Course: Work-up with laboratory studies and imaging.  No medications administered.  Review of Systems:  Not obtainable except as above.  PMH . Chronic systolic CHF. . CAD . Diabetes with diabetic neuropathy . Hypertension, hyperlipidemia . Mental retardation . Paranoid schizophrenia . Remainder reviewed in Epic  [place] . Foot surgery . Remainder reviewed in Epic  Family history includes: . Father with alcohol abuse . Remainder reviewed in [place] History . Unavailable but alcohol and drug use is checked now.  Former smoker documented.  Allergies . None known . Remainder reviewed in Epic  Meds include:  Current Facility-Administered Medications:  .   stroke: mapping our early stages of recovery book, , Does not apply, Once, Samuella Cota, MD .   0.9 %  sodium chloride infusion, , Intravenous, Continuous, Samuella Cota, MD .  acetaminophen (TYLENOL) tablet 650 mg, 650 mg, Oral, Q4H PRN **OR** acetaminophen (TYLENOL) 160 MG/5ML solution 650 mg, 650 mg, Per Tube, Q4H PRN **OR** acetaminophen (TYLENOL) suppository 650 mg, 650 mg, Rectal, Q4H PRN, Samuella Cota, MD .  insulin aspart (novoLOG) injection 0-6 Units, 0-6 Units, Subcutaneous, Q6H, Samuella Cota, MD .  levothyroxine (SYNTHROID, LEVOTHROID) injection 50 mcg, 50 mcg, Intravenous, Daily, Samuella Cota, MD  Current Outpatient Medications:  .  acetaminophen (TYLENOL) 325 MG tablet, Take 650 mg by mouth every 4 (four) hours as needed for moderate pain, fever or headache. , Disp: , Rfl:  .  ARIPiprazole (ABILIFY) 30 MG tablet, Take 30 mg by mouth daily. , Disp: , Rfl:  .  aspirin 81 MG tablet, Take 81 mg by mouth daily., Disp: , Rfl:  .  atorvastatin (LIPITOR) 20 MG tablet, Take 20 mg by mouth at bedtime. , Disp: , Rfl:  .  benztropine (COGENTIN) 0.5 MG tablet, Take 0.5 mg by mouth 2 (two) times daily., Disp: , Rfl:  .  carvedilol (COREG) 3.125 MG tablet, Take 3.125 mg by mouth 2 (two) times daily., Disp: , Rfl:  .  dextromethorphan-guaiFENesin (TUSSIN DM) 10-100 MG/5ML liquid, Take 10 mLs by mouth every 6 (six) hours as needed for cough., Disp: , Rfl:  .  docusate sodium (COLACE) 100 MG capsule, Take 100 mg by mouth 2 (two) times daily as needed for mild constipation. , Disp: , Rfl:  .  fluPHENAZine decanoate (PROLIXIN) 25 MG/ML injection, Inject 31.25  mg into the muscle every 14 (fourteen) days. , Disp: , Rfl:  .  fluticasone (FLONASE) 50 MCG/ACT nasal spray, Place 2 sprays into both nostrils daily. , Disp: , Rfl:  .  gabapentin (NEURONTIN) 100 MG capsule, Take 200 mg by mouth 3 (three) times daily., Disp: , Rfl:  .  levothyroxine (SYNTHROID, LEVOTHROID) 75 MCG tablet, Take 75 mcg by mouth daily before breakfast., Disp: , Rfl:  .  lithium carbonate (LITHOBID) 300 MG CR  tablet, Take 300 mg by mouth every 12 (twelve) hours. , Disp: , Rfl:  .  metFORMIN (GLUCOPHAGE-XR) 500 MG 24 hr tablet, Take 1,000 mg by mouth daily with breakfast., Disp: , Rfl:  .  naproxen (NAPROSYN) 500 MG tablet, Take 500 mg by mouth 2 (two) times daily with a meal., Disp: , Rfl:  .  nystatin cream (MYCOSTATIN), Apply 1 application topically 4 (four) times daily. , Disp: , Rfl:  .  omeprazole (PRILOSEC) 20 MG capsule, Take 20 mg by mouth 2 (two) times daily. , Disp: , Rfl:  .  pioglitazone (ACTOS) 15 MG tablet, Take 15 mg by mouth daily., Disp: , Rfl:  .  polyethylene glycol (MIRALAX) 17 g packet, Take 17 g by mouth daily. , Disp: , Rfl:  .  senna (SENOKOT) 8.6 MG tablet, Take 1 tablet by mouth at bedtime. , Disp: , Rfl:  .  sertraline (ZOLOFT) 100 MG tablet, Take 100 mg by mouth daily., Disp: , Rfl:  .  traZODone (DESYREL) 50 MG tablet, Take 1 tablet (50 mg total) by mouth at bedtime., Disp: 30 tablet, Rfl: 0  Physicial Exam   Vitals:  . 21, 91, 117/19, 98%  Constitutional.  Appears encephalopathic, confused, mumbling incoherently, moving all extremities. Eyes.  Pupils, irises, lids appear grossly unremarkable. ENT cannot assess hearing.  Lips appear unremarkable. Neck appears grossly unremarkable Cardiovascular regular rate and rhythm.  No murmur, rub or gallop no lower extremity edema. Respiratory.  Clear to auscultation bilaterally.  No wheezes, rales or rhonchi.  Normal respiratory effort. Abdomen.  Obese.  Soft, nontender, nondistended. Skin.  Appears grossly unremarkable without rash or lesions.  Nontender to palpation. Musculoskeletal.  Grossly normal tone and strength in all extremities.  Does not follow commands.  Exam is limited. Neurologic.  Exam is quite limited, patient cannot participate, no gross focal deficits.  Speech is incoherent, difficult to assess.  I have personally reviewed following labs and imaging studies  Labs:  [facility] Kitchen Potassium 5.5, glucose 199, anion gap  7, remainder CMP unremarkable. . WBC within normal limits.  Hemoglobin and platelets unremarkable. . Salicylate, acetaminophen levels negative, alcohol and salicylate levels negative . Urinalysis negative . Covid PCR and influenza negative  Imaging studies:   Chest x-ray no acute disease  CT head no acute disease  CTA neck no significant stenosis  CTA head no significant stenosis  CT head perfusion unremarkable  Medical tests:   EKG independently reviewed: Sinus rhythm, left bundle branch block, no significant change since last test Aug 17, 2016   ASSESSMENT/PLAN  Acute encephalopathy --Etiology unclear, there is no fever, urinalysis is negative, skin appears unremarkable, chest is clear, glucose is within normal limits and electrolytes are unremarkable.  In the absence of fever or other systemic signs or symptoms, I do not think further infectious investigation is indicated and would not pursue LP at this time as there is no suspicion for CNS infection. --Was admitted under similar circumstances 2 years ago, work-up was unrevealing and conclusion was insomnia, mother  does corroborate that the patient sleeps very little and wonders if this may simply be profound exhaustion. --Imaging also unremarkable and neurologic exam although limited is unrevealing. --She does appear to be coming around, and wonder if this is a postictal state.  Agree with EEG, check lithium level and further testing as per neurology. --Currently maintaining airway on nasal cannula oxygen saturations are acceptable.  Admit to stepdown for close monitoring.  Paranoid schizophrenia --Hold p.o. meds for now, resume Abilify, Prolixin, Cogentin, Zoloft, trazodone when able --Hold lithium check lithium levels  Chronic systolic CHF --Appears euvolemic.  Hold p.o. meds for now.  When able resume Coreg, atorvastatin, aspirin  Diabetes mellitus type 2 with peripheral neuropathy --CBG stable.  Anion gap within normal  limits. --Sliding scale insulin.  Hold Metformin and Actos. --Resume gabapentin when able  Hypothyroidism --Check TSH.  Change Synthroid IV  GERD --IV PPI  DVT prophylaxis: SCDs Code Status: Full Family Communication: mother by telephone Consults called: neurology    Time spent: 77 minutes  Murray Hodgkins, MD  [facility] Direct contact: see [email or web address]  7PM-7AM contact night coverage as below   1. Check the care team in [facility] and look for a) attending/consulting [facility] provider listed and b) the [facility] team listed 2. Log into [email or web address] and use [place]'s universal password to access. If you do not have the password, please contact the hospital operator. 3. Locate the [facility] provider you are looking for under [facility] and page to a number that you can be directly reached. 4. If you still have difficulty reaching the provider, please page the [facility] (Director on Call) for the Hospitalists listed on amion for assistance.  Severity of Illness: The appropriate patient status for this patient is OBSERVATION. Observation status is judged to be reasonable and necessary in order to provide the required intensity of service to ensure the patient's safety. The patient's presenting symptoms, physical exam findings, and initial radiographic and laboratory data in the context of their medical condition is felt to place them at decreased risk for further clinical deterioration. Furthermore, it is anticipated that the patient will be medically stable for discharge from the hospital within 2 midnights of admission. The following factors support the patient status of observation.   " The patient's presenting symptoms include altered mental status, somnolence. " The physical exam findings include confusion, mumbling, incoherent speech, inability to follow commands. " The initial radiographic and laboratory data are generally reassuring.    03/20/2019, 3:39 PM   Principal Problem:    Acute encephalopathy Active Problems:   Chronic systolic heart failure (HCC)   Controlled type 2 diabetes mellitus without complication ([place])   Diabetic neuropathy ([place])

## 2019-03-20 NOTE — ED Notes (Signed)
Admitting MD at bedside at this time to assess patient.

## 2019-03-20 NOTE — ED Triage Notes (Signed)
Pt to ED via [facility] from group home. Pt found unresponsive after CBG check at 0715 which was in the 100s. Upon arrival EMS reports pt found in head to chest position and unresponsive. EMS CBG 217. BP in [id number] systolic w/ bolus given w/ improvement systolic BP [id number]. First HR 50s increased to 63.  Per EMS pt only responsive to painful stimuli.   Upon arrival pt unresponsive. Code stroke called.

## 2019-03-20 NOTE — ED Notes (Signed)
Pt's O2 dropped to 87% while sleeping with a good wave form. Pt's O2 was increased to [facility] to assist with her perfusion while asleep. O2 now 96% on the monitor

## 2019-03-20 NOTE — Progress Notes (Signed)
eeg completed ° °

## 2019-03-20 NOTE — ED Notes (Signed)
Pt lying in bed showing asleep, eyes closed with equal unlabored RR. Sitter at bedside for safety. Pt clean and dry. Will continue to monitor

## 2019-03-20 NOTE — ED Notes (Signed)
This RN and Sherrie, NT at bedside at this time. Explained to patient she could not scream at staff or swing with mitts on. Pt began to cover her face and attempt to cover her ears while this RN speaking. This RN explained to patient that even though she was covering her ears, pt behavior would not be tolerated. Pt does not open her eyes when she is being spoken to but does acknowledge the speaker.

## 2019-03-20 NOTE — ED Notes (Signed)
Pt continuing to be more alert. Pt eyes still closed. Pt still continues to have incomprehensible speech. Admitting provider at bedside.

## 2019-03-20 NOTE — ED Notes (Addendum)
Pt lying in bed awake with eyes closed, equal unlabored RR showing no signs of acute distress. Pt speaking incoherently and unable to fully follow directions without constant redirection. Sitter at bedside at this time for safety. Will continue to monitor

## 2019-03-21 ENCOUNTER — Observation Stay: Payer: Medicare Other

## 2019-03-21 DIAGNOSIS — E119 Type 2 diabetes mellitus without complications: Secondary | ICD-10-CM | POA: Diagnosis not present

## 2019-03-21 DIAGNOSIS — I5022 Chronic systolic (congestive) heart failure: Secondary | ICD-10-CM | POA: Diagnosis not present

## 2019-03-21 DIAGNOSIS — G9340 Encephalopathy, unspecified: Secondary | ICD-10-CM | POA: Diagnosis not present

## 2019-03-21 LAB — BASIC METABOLIC PANEL
Anion gap: 6 (ref 5–15)
BUN: 17 mg/dL (ref 6–20)
CO2: 25 mmol/L (ref 22–32)
Calcium: 10.2 mg/dL (ref 8.9–10.3)
Chloride: 111 mmol/L (ref 98–111)
Creatinine, Ser: 1.15 mg/dL — ABNORMAL HIGH (ref 0.44–1.00)
GFR calc Af Amer: >60 mL/min (ref >60)
GFR calc non Af Amer: 52 mL/min — ABNORMAL LOW (ref >60)
Glucose, Bld: 296 mg/dL — ABNORMAL HIGH (ref 70–99)
Potassium: 4.7 mmol/L (ref 3.5–5.1)
Sodium: 142 mmol/L (ref 135–145)

## 2019-03-21 LAB — GLUCOSE, CAPILLARY
Glucose-Capillary: 85 mg/dL (ref 70–99)
Glucose-Capillary: 101 mg/dL — ABNORMAL HIGH (ref 70–99)
Glucose-Capillary: 241 mg/dL — ABNORMAL HIGH (ref 70–99)
Glucose-Capillary: 164 mg/dL — ABNORMAL HIGH (ref 70–99)
Glucose-Capillary: 142 mg/dL — ABNORMAL HIGH (ref 70–99)

## 2019-03-21 LAB — HIV ANTIBODY (ROUTINE TESTING W REFLEX): HIV Screen 4th Generation wRfx: NONREACTIVE

## 2019-03-21 LAB — LIPID PANEL
Cholesterol: 115 mg/dL (ref 0–200)
HDL: 52 mg/dL (ref >40)
LDL Cholesterol: 45 mg/dL (ref 0–99)
Total CHOL/HDL Ratio: 2.2 RATIO
Triglycerides: 88 mg/dL (ref <150)
VLDL: 18 mg/dL (ref 0–40)

## 2019-03-21 LAB — HEMOGLOBIN A1C
Hgb A1c MFr Bld: 7.1 % — ABNORMAL HIGH (ref 4.8–5.6)
Mean Plasma Glucose: 157.07 mg/dL

## 2019-03-21 LAB — TSH: TSH: 0.529 u[IU]/mL (ref 0.350–4.500)

## 2019-03-21 MED ORDER — CARVEDILOL 3.125 MG PO TABS
3.1250 mg | ORAL_TABLET | Freq: Two times a day (BID) | ORAL | Status: DC
  Filled 2019-03-21: qty 1

## 2019-03-21 MED ORDER — SODIUM CHLORIDE 0.9 % IV SOLN: INTRAVENOUS | Status: DC

## 2019-03-21 NOTE — ED Notes (Signed)
Patient discharged to group home. D/C paperwork reviewed with Aide and advised of follow up appointment.

## 2019-03-21 NOTE — ED Notes (Signed)
Pt provided diet cola per request.

## 2019-03-21 NOTE — ED Notes (Signed)
Pt provided breakfast tray; able to feed self. 

## 2019-03-21 NOTE — Discharge Instructions (Signed)
Toxic Metabolic Encephalopathy Toxic metabolic encephalopathy (TME) is a type of brain disorder caused by a change in brain chemistry. This condition may result from illnesses or conditions that cause an imbalance of fluid, minerals (electrolytes), and other substances in the body that affect the way the brain functions. It is not caused by brain damage or brain disease. TME can cause confusion and other mental disturbances, which are generally referred to as delirium. Untreated delirium may lead to permanent mental changes or worsening medical conditions. Untreated delirium is a life-threatening condition that may need to be treated in the hospital. What are the causes? Possible causes of TME that can lead to deliriuminclude:  Short-term (acute) or long-term (chronic) disease of the kidney or liver.  Not having enough fluid in the body (dehydration).  Changes in the acid level (pH) of the blood.  High or low levels of any of the following substances in the blood: ? Calcium. ? Salt (sodium). ? Sugar (glucose). ? Magnesium. ? Phosphate.  High body temperature.  Not having enough oxygen in the blood.  Low levels of B vitamins. This can result from alcohol abuse.  Certain medicines, such as steroids and medicines that reduce the activity of the immune system (immunosuppressants).  Certain infections. What increases the risk? You may have a higher risk for TME if you:  Are elderly.  Have dementia.  Are in the hospital, especially in intensive care.  Live in a nursing home.  Had recent surgery.  Have liver or kidney disease.  Have poorly controlled diabetes.  Have chronic medical problems, especially heart or lung disease.  Are not getting enough fluids.  Have poor nutrition.  Abuse alcohol. What are the signs or symptoms? Symptoms of TME may include:  Muscle stiffness or jerking (spasticity).  Shaking (tremors).  Flapping of the  hands.  Weakness.  Clumsiness.  Slowed breathing.  Jerky movements that you cannot control (seizures).  Not being able to stay awake (drowsiness).  Not being able to pay attention.  Loss of consciousness (coma). Symptoms of delirium caused by TME include:  Confusion.  Difficulty focusing or concentrating, or inability to focus or concentrate.  Not knowing where you are (disorientation).  Seeing or hearing things that are not real (hallucinations).  Fearfulness.  False beliefs (delusions).  Changes in mood or personality.  Changes in speech, such as saying things that do not make sense.  Memory loss.  Irritability.  Avoiding other people (withdrawal).  Depression.  Poor judgment.  Changes in eating and sleeping patterns.  Hyperactivity.  Decreased alertness.  General mistrust of others (paranoia). Delirium may come and go. Symptoms of delirium may start suddenly or gradually, and they often get worse at night. How is this diagnosed? This condition is diagnosed based on:  Your symptoms and behavior.  An exam to check how you are thinking, feeling, and behaving (mental status exam). To diagnose delirium, the mental status exam must rule out other possible causes of TME, and must show: ? Changes in attention and awareness. ? Changes that develop over a short period of time and tend to come and go (fluctuate). ? Changes in memory, language, and thinking that were not present before.  A physical exam.  Imaging tests, such as: ? MRI. ? CT scan.  Blood tests to: ? Measure liver and kidney function. ? Check for a lack (deficiency) of vitamin B. ? Check for changes in acid levels (pH) and changes in calcium, sodium, or magnesium levels in the blood. ?  Measure your blood sugar (glucose). ? Measure your blood oxygen level. How is this treated? Treatment for TME depends on the cause, and it may include.  Getting fluids through an IV tube.  Regulating  calcium, sodium, glucose, or magnesium levels in the body.  Getting oxygen.  Improving nutrition.  Treating liver or kidney disease.  Adjusting certain medicines.  Treating infections. If the cause is found and treated, delirium usually improves. Managing delirium may include:  Keeping the room well-lit and quiet.  Using calendars, pictures, and clocks to prevent disorientation.  Having frequent checks from nursing staff and visits from caregivers.  Wearing eyeglasses or a hearing aid, if needed.  Physical therapy.  Medicine to treat agitation, anxiety, hallucinations, or delusions. Follow these instructions at home:  Drink enough fluid to keep your urine clear or pale yellow.  Take over-the-counter and prescription medicines only as told by your health care provider.  Return to your normal activities as told by your health care provider. Ask your health care provider what activities are safe for you.  Follow a healthy diet. Do not skip meals.  Do not drink alcohol.  Go to bed at the same time every night.  Keep all follow-up visits as told by your health care provider. This is important. Contact a health care provider if:  You are unable to feed yourself or hydrate yourself.  You need help at home.  You start to feel clumsy.  You start to have tremors or weakness. Get help right away if:  You have a seizure.  You lose consciousness.  You have trouble breathing.  You do not feel able to care for yourself at home.  You have a fever.  You become disoriented at home. This information is not intended to replace advice given to you by your health care provider. Make sure you discuss any questions you have with your health care provider. Document Revised: 02/17/2017 Document Reviewed: 08/14/2015 Elsevier Patient Education  2020 [employer].

## 2019-03-21 NOTE — Progress Notes (Signed)
OT Cancellation Note  Patient Details Name: Elizabeth Gutierrez MRN: [id number] DOB: 1960-05-14   Cancelled Treatment:    Reason Eval/Treat Not Completed: Medical issues which prohibited therapy(Pt.'s Potassium continues to be elevated to 5.5 this morning. Will continue to monitor, and perform the initial OT visit at a later time, or date.)  Harrel Carina, MS, OTR/L 03/21/2019, 9:02 AM

## 2019-03-21 NOTE — ED Notes (Signed)
Pt more alert and coherent at this time. Currently  A&Ox4 when asked direct questions.

## 2019-03-21 NOTE — ED Notes (Signed)
Lab to bedside at this time. 

## 2019-03-21 NOTE — ED Notes (Signed)
Lab notified to collect routine blood specimens.

## 2019-03-21 NOTE — ED Notes (Addendum)
Pt monitor alarming. EKG stickers seen sticking to pt gown instead of pt chest. Replaced. Pt currently has no increased work of breathing or acute distress noted. Alert and talkative at this time. sitter at the bedside for pt safety.

## 2019-03-21 NOTE — ED Notes (Signed)
Neurology to bedside

## 2019-03-21 NOTE — ED Notes (Signed)
[place] updated by [employer] [phone]

## 2019-03-21 NOTE — ED Notes (Signed)
Patient has sitter in room.  Patient is alert and coopertive.  Showing me her jewelery and watches.  Easily directed.

## 2019-03-21 NOTE — ED Notes (Signed)
Lab to bedside

## 2019-03-21 NOTE — ED Notes (Signed)
Hospitalist to bedside.

## 2019-03-21 NOTE — ED Notes (Signed)
Patient transported to MRI 

## 2019-03-21 NOTE — Progress Notes (Signed)
SLP Cancellation Note  Patient Details Name: Elizabeth Gutierrez MRN: [id number] DOB: Oct 03, 1960   Cancelled treatment:       Reason Eval/Treat Not Completed: SLP screened, no needs identified, will sign off(chart reviewed; consulted NSG staff w/ pt). Per NSG and MD reports, pt is a 58 year old woman w/ PMH schizophrenia, CAD, diabetes, mental retardation who resides in a group home. Pt has been eating well at meals today w/ No difficulty swallowing. She is currently on a regular diet; tolerates swallowing pills w/ water per NSG. Pt conversed in conversational w/ SLP and [facility] staff member indicating her wants/needs; she recalled w/ SLP the omelette she ate this morning at breakfast and the items in the omelette. Speech is fluent. She is oriented to her name, place, family members. Neurology stated she is at her baseline Cognitive function now, per notes.   No further skilled ST services indicated as pt appears at her baseline. Pt agreed. NSG to reconsult if any change in status while admitted.      [name], Cassandra, CCC-SLP Aricela Bertagnolli 03/21/2019, 3:30 PM

## 2019-03-21 NOTE — ED Notes (Signed)
Pt given meal tray. Sitter at bedside to assist patient.

## 2019-03-21 NOTE — Discharge Summary (Addendum)
Physician Discharge Summary  Elizabeth Gutierrez [id number] DOB: 1960/05/09 DOA: 03/20/2019  PCP: Denton Lank, MD  Admit date: 03/20/2019 Discharge date: 03/21/2019  Recommendations for Outpatient Follow-up:  1. Follow-up lithium level. 2. Consider review of polypharmacy 3. Ongoing supportive care.  Follow-up Information    Denton Lank, MD. Schedule an appointment as soon as possible for a visit in 1 week(s).   Specialty: Family Medicine Contact information: [address] [place] [address] [phone]            Discharge Diagnoses: Principal diagnosis is #1 1. Acute encephalopathy, unable to specify further 2. Paranoid schizophrenia 3. Chronic systolic CHF 4. Diabetes mellitus type 2 with peripheral neuropathy 5. Hypothyroidism 6. GERD  Discharge Condition: Improved Disposition: Return to group home, home health physical therapy  Diet recommendation: Diabetic diet  Filed Weights   03/20/19 1429  Weight: 95.3 kg    History of present illness:  58 year old woman PMH schizophrenia, CAD, diabetes, mental retardation, resides in a group home, who reportedly was well at 7 AM, had blood sugar around 170, and then later that morning was found to be unresponsive, slumped over.  EMS reported blood sugar 200, hypotensive in the 80s which resolved with fluid bolus.  Admitted for acute encephalopathy.  Hospital Course:  Patient was observed with supportive care, her encephalopathy gradually resolved and work-up was unrevealing.  Specifically no evidence of infection, no significant metabolic abnormalities detected, no suspicion of toxic etiology.  Lithium level is pending but given her rapid clinical improvement, expect that to be within normal limits or low.  She was seen by neurology and underwent extensive imaging investigation which was unrevealing.  Given her spontaneous clearance back to baseline, negative imaging and reassuring blood work, she was  discharged back to group home in excellent condition. She had mild tachycardia, probably attributed to missed beta-blocker, orthostatics were equivocal but she did well with physical therapy and home health PT was recommended.  Today's assessment: S: No complaints.  Eating well. O: Vitals:  Respirations appear normal approximately 20, heart rate 101, blood pressure 111/94, orthostatics equivocal, SpO2 98% RA Constitutional, very well-appearing, Psychiatric.  Grossly normal mood and affect.  Speech is fluent and appropriate. Cardiovascular.  Regular rate and rhythm.  No murmur, rub or gallop.  No lower extremity edema. Respiratory.  Clear to auscultation bilaterally.  No wheezes, rales or rhonchi.  Normal respiratory effort. Abdomen.  Soft. Neurologic/musculoskeletal.  Grossly normal appearance of the face.  Arms and legs strength is excellent all extremities.  CBG unremarkable BMP unremarkable today, potassium is normal Hemoglobin A1c 7.1, TSH 0.529 Influenza PCR was negative as well as Covid MRI brain no acute abnormalities.  Discharge Instructions  Discharge Instructions    Diet - low sodium heart healthy   Complete by: As directed    Increase activity slowly   Complete by: As directed      Allergies as of 03/21/2019   No Known Allergies     Medication List    TAKE these medications   acetaminophen 325 MG tablet Commonly known as: TYLENOL Take 650 mg by mouth every 4 (four) hours as needed for moderate pain, fever or headache.   ARIPiprazole 30 MG tablet Commonly known as: ABILIFY Take 30 mg by mouth daily.   aspirin 81 MG tablet Take 81 mg by mouth daily.   atorvastatin 20 MG tablet Commonly known as: LIPITOR Take 20 mg by mouth at bedtime.   benztropine 0.5 MG tablet Commonly known as:  COGENTIN Take 0.5 mg by mouth 2 (two) times daily.   carvedilol 3.125 MG tablet Commonly known as: COREG Take 3.125 mg by mouth 2 (two) times daily.   docusate sodium 100  MG capsule Commonly known as: COLACE Take 100 mg by mouth 2 (two) times daily as needed for mild constipation.   fluPHENAZine decanoate 25 MG/ML injection Commonly known as: PROLIXIN Inject 31.25 mg into the muscle every 14 (fourteen) days.   fluticasone 50 MCG/ACT nasal spray Commonly known as: FLONASE Place 2 sprays into both nostrils daily.   gabapentin 100 MG capsule Commonly known as: NEURONTIN Take 200 mg by mouth 3 (three) times daily.   levothyroxine 75 MCG tablet Commonly known as: SYNTHROID Take 75 mcg by mouth daily before breakfast.   lithium carbonate 300 MG CR tablet Commonly known as: LITHOBID Take 300 mg by mouth every 12 (twelve) hours.   metFORMIN 500 MG 24 hr tablet Commonly known as: GLUCOPHAGE-XR Take 1,000 mg by mouth daily with breakfast.   MiraLax 17 g packet Generic drug: polyethylene glycol Take 17 g by mouth daily.   naproxen 500 MG tablet Commonly known as: NAPROSYN Take 500 mg by mouth 2 (two) times daily with a meal.   nystatin cream Commonly known as: MYCOSTATIN Apply 1 application topically 4 (four) times daily.   omeprazole 20 MG capsule Commonly known as: PRILOSEC Take 20 mg by mouth 2 (two) times daily.   pioglitazone 15 MG tablet Commonly known as: ACTOS Take 15 mg by mouth daily.   senna 8.6 MG tablet Commonly known as: SENOKOT Take 1 tablet by mouth at bedtime.   sertraline 100 MG tablet Commonly known as: ZOLOFT Take 100 mg by mouth daily.   traZODone 50 MG tablet Commonly known as: DESYREL Take 1 tablet (50 mg total) by mouth at bedtime.   Tussin DM 10-100 MG/5ML liquid Generic drug: dextromethorphan-guaiFENesin Take 10 mLs by mouth every 6 (six) hours as needed for cough.      No Known Allergies  The results of significant diagnostics from this hospitalization (including imaging, microbiology, ancillary and laboratory) are listed below for reference.    Significant Diagnostic Studies: EEG  Result Date:  03/20/2019 Alexis Goodell, MD     03/20/2019  7:06 PM ELECTROENCEPHALOGRAM REPORT Patient: Elizabeth Gutierrez       Room #: [id number] EEG No. ID: [id number] Age: 58 y.o.        Sex: female Referring Physician: Sarajane Jews Report Date:  03/20/2019       Interpreting Physician: Alexis Goodell History: Elizabeth Gutierrez is an 58 y.o. female presenting unresponsive Medications: Lithium, Synthroid, Insulin Conditions of Recording:  This is a 21 channel routine scalp EEG performed with bipolar and monopolar montages arranged in accordance to the international 10/20 system of electrode placement. One channel was dedicated to EKG recording. The patient is in the awake and drowsy states. Description:  Artifact is prominent during the recording often obscuring the background rhythm. When able to be visualized the waking background activity consists of a low voltage, symmetrical, fairly well organized, 8 Hz alpha activity, seen from the parieto-occipital and posterior temporal regions.  Low voltage fast activity, poorly organized, is seen anteriorly and is at times superimposed on more posterior regions.  A mixture of theta and alpha rhythms are seen from the central and temporal regions. The patient drowses with slowing to irregular, low voltage theta and beta activity.  Stage II sleep is not obtained. No epileptiform activity is noted.  Hyperventilation was not performed. Intermittent photic stimulation was performed but failed to illicit any change in the tracing.  IMPRESSION: Normal electroencephalogram, awake, drowsy and with activation procedures. There are no focal lateralizing or epileptiform features. Alexis Goodell, MD Neurology [phone] 03/20/2019, 7:03 PM   CT ANGIO HEAD W OR WO CONTRAST  Result Date: 03/20/2019 CLINICAL DATA:  Stroke, follow-up. EXAM: CT ANGIOGRAPHY HEAD AND NECK CT PERFUSION BRAIN TECHNIQUE: Multidetector CT imaging of the head and neck was performed using the standard protocol during bolus  administration of intravenous contrast. Multiplanar CT image reconstructions and MIPs were obtained to evaluate the vascular anatomy. Carotid stenosis measurements (when applicable) are obtained utilizing NASCET criteria, using the distal internal carotid diameter as the denominator. Multiphase CT imaging of the brain was performed following IV bolus contrast injection. Subsequent parametric perfusion maps were calculated using RAPID software. CONTRAST:  164mL OMNIPAQUE IOHEXOL 350 MG/ML SOLN COMPARISON:  Noncontrast head CT performed earlier the same day FINDINGS: CT HEAD FINDINGS Brain: No evidence of acute intracranial hemorrhage. No demarcated cortical infarction. No evidence of intracranial mass. No midline shift or extra-axial fluid collection. Mild ill-defined hypoattenuation within the cerebral white matter is nonspecific, but consistent with chronic small vessel ischemic disease. Cerebral volume is normal for age. Vascular: Reported separately Skull: Normal. Negative for fracture or focal lesion. Sinuses/Orbits: Visualized orbits demonstrate no acute abnormality. Small mucous retention cyst within the inferior left maxillary sinus. No significant mastoid effusion. ASPECTS ([place] Stroke Program Early CT Score) - Ganglionic level infarction (caudate, lentiform nuclei, internal capsule, insula, M1-M3 cortex): 7 - Supraganglionic infarction (M4-M6 cortex): 3 Total score (0-10 with 10 being normal): 10 Review of the MIP images confirms the above findings CTA NECK FINDINGS Aortic arch: Standard aortic branching. Right carotid system: CCA and ICA patent within the neck without stenosis. No significant atherosclerotic disease. Left carotid system: CCA and ICA patent within the neck without stenosis. Vertebral arteries: The left vertebral artery is slightly dominant. The vertebral arteries are patent within the neck bilaterally without significant stenosis. Skeleton: No acute bony abnormality. Other neck:  Prominent thyroid gland. There is a nodule within the left thyroid lobe measuring 1.2 cm. There also scattered coarse calcifications within the thyroid gland. Borderline enlarged left level III lymph node measuring 10 mm in short axis, nonspecific (series 8, image 196) Upper chest: Patchy atelectasis within the imaged lung apices. Review of the MIP images confirms the above findings CTA HEAD FINDINGS Anterior circulation: The intracranial internal carotid arteries are patent with scattered calcified plaque but no significant stenosis. The bilateral middle and anterior cerebral arteries are patent without significant proximal stenosis. No intracranial aneurysm is identified. Posterior circulation: The intracranial vertebral arteries are patent without significant stenosis. The non dominant right vertebral artery is developmentally diminutive beyond the origin of the right PICA. The basilar artery is patent without significant stenosis. The posterior cerebral arteries are patent bilaterally without significant proximal stenosis. Small bilateral posterior communicating arteries are present. Venous sinuses: Within limitations of contrast timing, no convincing thrombus. Anatomic variants: None significant Review of the MIP images confirms the above findings CT Brain Perfusion Findings: ASPECTS: 10 CBF (<30%) Volume: 61mL Perfusion (Tmax>6.0s) volume: 39mL Mismatch Volume: None identified Infarction Location:None These results were called by telephone at the time of interpretation on 03/20/2019 at 1:47 pm to provider Dr. Joni Fears, who verbally acknowledged these results. IMPRESSION: CT head: 1. No evidence of acute intracranial abnormality. 2. Mild chronic small vessel ischemic disease. CTA neck: 1. The common carotid, internal carotid  and vertebral arteries are patent within the neck without significant stenosis. No significant atherosclerotic disease. 2. Borderline enlarged left level III lymph node, nonspecific. Clinical  correlation is recommended. 3. Enlarged thyroid gland containing scattered coarse calcifications. 1.2 cm left thyroid lobe nodule. CTA head: No intracranial large vessel occlusion or proximal high-grade arterial stenosis. CT perfusion head: No core infarct is identified by the perfusion software. Additionally, the perfusion software demonstrates no region of critically hypoperfused parenchyma utilizing the a Tmax>6 seconds threshold. Electronically Signed   By: Kellie Simmering DO   On: 03/20/2019 13:47   CT ANGIO NECK W OR WO CONTRAST  Result Date: 03/20/2019 CLINICAL DATA:  Stroke, follow-up. EXAM: CT ANGIOGRAPHY HEAD AND NECK CT PERFUSION BRAIN TECHNIQUE: Multidetector CT imaging of the head and neck was performed using the standard protocol during bolus administration of intravenous contrast. Multiplanar CT image reconstructions and MIPs were obtained to evaluate the vascular anatomy. Carotid stenosis measurements (when applicable) are obtained utilizing NASCET criteria, using the distal internal carotid diameter as the denominator. Multiphase CT imaging of the brain was performed following IV bolus contrast injection. Subsequent parametric perfusion maps were calculated using RAPID software. CONTRAST:  17mL OMNIPAQUE IOHEXOL 350 MG/ML SOLN COMPARISON:  Noncontrast head CT performed earlier the same day FINDINGS: CT HEAD FINDINGS Brain: No evidence of acute intracranial hemorrhage. No demarcated cortical infarction. No evidence of intracranial mass. No midline shift or extra-axial fluid collection. Mild ill-defined hypoattenuation within the cerebral white matter is nonspecific, but consistent with chronic small vessel ischemic disease. Cerebral volume is normal for age. Vascular: Reported separately Skull: Normal. Negative for fracture or focal lesion. Sinuses/Orbits: Visualized orbits demonstrate no acute abnormality. Small mucous retention cyst within the inferior left maxillary sinus. No significant  mastoid effusion. ASPECTS ([place] Stroke Program Early CT Score) - Ganglionic level infarction (caudate, lentiform nuclei, internal capsule, insula, M1-M3 cortex): 7 - Supraganglionic infarction (M4-M6 cortex): 3 Total score (0-10 with 10 being normal): 10 Review of the MIP images confirms the above findings CTA NECK FINDINGS Aortic arch: Standard aortic branching. Right carotid system: CCA and ICA patent within the neck without stenosis. No significant atherosclerotic disease. Left carotid system: CCA and ICA patent within the neck without stenosis. Vertebral arteries: The left vertebral artery is slightly dominant. The vertebral arteries are patent within the neck bilaterally without significant stenosis. Skeleton: No acute bony abnormality. Other neck: Prominent thyroid gland. There is a nodule within the left thyroid lobe measuring 1.2 cm. There also scattered coarse calcifications within the thyroid gland. Borderline enlarged left level III lymph node measuring 10 mm in short axis, nonspecific (series 8, image 196) Upper chest: Patchy atelectasis within the imaged lung apices. Review of the MIP images confirms the above findings CTA HEAD FINDINGS Anterior circulation: The intracranial internal carotid arteries are patent with scattered calcified plaque but no significant stenosis. The bilateral middle and anterior cerebral arteries are patent without significant proximal stenosis. No intracranial aneurysm is identified. Posterior circulation: The intracranial vertebral arteries are patent without significant stenosis. The non dominant right vertebral artery is developmentally diminutive beyond the origin of the right PICA. The basilar artery is patent without significant stenosis. The posterior cerebral arteries are patent bilaterally without significant proximal stenosis. Small bilateral posterior communicating arteries are present. Venous sinuses: Within limitations of contrast timing, no convincing  thrombus. Anatomic variants: None significant Review of the MIP images confirms the above findings CT Brain Perfusion Findings: ASPECTS: 10 CBF (<30%) Volume: 60mL Perfusion (Tmax>6.0s) volume: 30mL Mismatch Volume:  None identified Infarction Location:None These results were called by telephone at the time of interpretation on 03/20/2019 at 1:47 pm to provider Dr. Joni Fears, who verbally acknowledged these results. IMPRESSION: CT head: 1. No evidence of acute intracranial abnormality. 2. Mild chronic small vessel ischemic disease. CTA neck: 1. The common carotid, internal carotid and vertebral arteries are patent within the neck without significant stenosis. No significant atherosclerotic disease. 2. Borderline enlarged left level III lymph node, nonspecific. Clinical correlation is recommended. 3. Enlarged thyroid gland containing scattered coarse calcifications. 1.2 cm left thyroid lobe nodule. CTA head: No intracranial large vessel occlusion or proximal high-grade arterial stenosis. CT perfusion head: No core infarct is identified by the perfusion software. Additionally, the perfusion software demonstrates no region of critically hypoperfused parenchyma utilizing the a Tmax>6 seconds threshold. Electronically Signed   By: Kellie Simmering DO   On: 03/20/2019 13:47   MR BRAIN WO CONTRAST  Result Date: 03/21/2019 CLINICAL DATA:  Encephalopathy EXAM: MRI HEAD WITHOUT CONTRAST TECHNIQUE: Multiplanar, multiecho pulse sequences of the brain and surrounding structures were obtained without intravenous contrast. COMPARISON:  Correlation made with recent CT imaging FINDINGS: Brain: There is no acute infarction or intracranial hemorrhage. There is no intracranial mass, mass effect, or edema. There is no hydrocephalus or extra-axial fluid collection. Patchy T2 hyperintensity in the supratentorial white matter is nonspecific but may reflect mild chronic microvascular ischemic changes. There is a small chronic infarct of the  right cerebellum. Vascular: Major vessel flow voids at the skull base are preserved. Skull and upper cervical spine: Normal marrow signal is preserved. Sinuses/Orbits: Minor mucosal thickening.  Orbits are unremarkable. Other: Sella is unremarkable.  Mastoid air cells are clear. IMPRESSION: No evidence of recent infarction, intracranial hemorrhage, or mass. Probable mild chronic microvascular ischemic changes. Small chronic right cerebellar infarct. Electronically Signed   By: Macy Mis M.D.   On: 03/21/2019 12:09   US Venous Img Lower Unilateral Left (DVT)  Result Date: 02/22/2019 CLINICAL DATA:  58 year old female with left lower extremity pain EXAM: LEFT LOWER EXTREMITY VENOUS DOPPLER ULTRASOUND TECHNIQUE: Gray-scale sonography with graded compression, as well as color Doppler and duplex ultrasound were performed to evaluate the lower extremity deep venous systems from the level of the common femoral vein and including the common femoral, femoral, profunda femoral, popliteal and calf veins including the posterior tibial, peroneal and gastrocnemius veins when visible. The superficial great saphenous vein was also interrogated. Spectral Doppler was utilized to evaluate flow at rest and with distal augmentation maneuvers in the common femoral, femoral and popliteal veins. COMPARISON:  None. FINDINGS: Contralateral Common Femoral Vein: Respiratory phasicity is normal and symmetric with the symptomatic side. No evidence of thrombus. Normal compressibility. Common Femoral Vein: No evidence of thrombus. Normal compressibility, respiratory phasicity and response to augmentation. Saphenofemoral Junction: No evidence of thrombus. Normal compressibility and flow on color Doppler imaging. Profunda Femoral Vein: No evidence of thrombus. Normal compressibility and flow on color Doppler imaging. Femoral Vein: No evidence of thrombus. Normal compressibility, respiratory phasicity and response to augmentation. Popliteal  Vein: No evidence of thrombus. Normal compressibility, respiratory phasicity and response to augmentation. Calf Veins: No evidence of thrombus. Normal compressibility and flow on color Doppler imaging. Superficial Great Saphenous Vein: No evidence of thrombus. Normal compressibility. Venous Reflux:  None. Other Findings:  None. IMPRESSION: No evidence of deep venous thrombosis. Electronically Signed   By: Jacqulynn Cadet M.D.   On: 02/22/2019 16:50   CT CEREBRAL PERFUSION W CONTRAST  Result Date: 03/20/2019 CLINICAL  DATA:  Stroke, follow-up. EXAM: CT ANGIOGRAPHY HEAD AND NECK CT PERFUSION BRAIN TECHNIQUE: Multidetector CT imaging of the head and neck was performed using the standard protocol during bolus administration of intravenous contrast. Multiplanar CT image reconstructions and MIPs were obtained to evaluate the vascular anatomy. Carotid stenosis measurements (when applicable) are obtained utilizing NASCET criteria, using the distal internal carotid diameter as the denominator. Multiphase CT imaging of the brain was performed following IV bolus contrast injection. Subsequent parametric perfusion maps were calculated using RAPID software. CONTRAST:  18mL OMNIPAQUE IOHEXOL 350 MG/ML SOLN COMPARISON:  Noncontrast head CT performed earlier the same day FINDINGS: CT HEAD FINDINGS Brain: No evidence of acute intracranial hemorrhage. No demarcated cortical infarction. No evidence of intracranial mass. No midline shift or extra-axial fluid collection. Mild ill-defined hypoattenuation within the cerebral white matter is nonspecific, but consistent with chronic small vessel ischemic disease. Cerebral volume is normal for age. Vascular: Reported separately Skull: Normal. Negative for fracture or focal lesion. Sinuses/Orbits: Visualized orbits demonstrate no acute abnormality. Small mucous retention cyst within the inferior left maxillary sinus. No significant mastoid effusion. ASPECTS ([place] Stroke Program  Early CT Score) - Ganglionic level infarction (caudate, lentiform nuclei, internal capsule, insula, M1-M3 cortex): 7 - Supraganglionic infarction (M4-M6 cortex): 3 Total score (0-10 with 10 being normal): 10 Review of the MIP images confirms the above findings CTA NECK FINDINGS Aortic arch: Standard aortic branching. Right carotid system: CCA and ICA patent within the neck without stenosis. No significant atherosclerotic disease. Left carotid system: CCA and ICA patent within the neck without stenosis. Vertebral arteries: The left vertebral artery is slightly dominant. The vertebral arteries are patent within the neck bilaterally without significant stenosis. Skeleton: No acute bony abnormality. Other neck: Prominent thyroid gland. There is a nodule within the left thyroid lobe measuring 1.2 cm. There also scattered coarse calcifications within the thyroid gland. Borderline enlarged left level III lymph node measuring 10 mm in short axis, nonspecific (series 8, image 196) Upper chest: Patchy atelectasis within the imaged lung apices. Review of the MIP images confirms the above findings CTA HEAD FINDINGS Anterior circulation: The intracranial internal carotid arteries are patent with scattered calcified plaque but no significant stenosis. The bilateral middle and anterior cerebral arteries are patent without significant proximal stenosis. No intracranial aneurysm is identified. Posterior circulation: The intracranial vertebral arteries are patent without significant stenosis. The non dominant right vertebral artery is developmentally diminutive beyond the origin of the right PICA. The basilar artery is patent without significant stenosis. The posterior cerebral arteries are patent bilaterally without significant proximal stenosis. Small bilateral posterior communicating arteries are present. Venous sinuses: Within limitations of contrast timing, no convincing thrombus. Anatomic variants: None significant Review of  the MIP images confirms the above findings CT Brain Perfusion Findings: ASPECTS: 10 CBF (<30%) Volume: 52mL Perfusion (Tmax>6.0s) volume: 55mL Mismatch Volume: None identified Infarction Location:None These results were called by telephone at the time of interpretation on 03/20/2019 at 1:47 pm to provider Dr. Joni Fears, who verbally acknowledged these results. IMPRESSION: CT head: 1. No evidence of acute intracranial abnormality. 2. Mild chronic small vessel ischemic disease. CTA neck: 1. The common carotid, internal carotid and vertebral arteries are patent within the neck without significant stenosis. No significant atherosclerotic disease. 2. Borderline enlarged left level III lymph node, nonspecific. Clinical correlation is recommended. 3. Enlarged thyroid gland containing scattered coarse calcifications. 1.2 cm left thyroid lobe nodule. CTA head: No intracranial large vessel occlusion or proximal high-grade arterial stenosis. CT perfusion head: No  core infarct is identified by the perfusion software. Additionally, the perfusion software demonstrates no region of critically hypoperfused parenchyma utilizing the a Tmax>6 seconds threshold. Electronically Signed   By: Kellie Simmering DO   On: 03/20/2019 13:47   DG Chest Portable 1 View  Result Date: 03/20/2019 CLINICAL DATA:  Patient found unresponsive today. EXAM: PORTABLE CHEST 1 VIEW COMPARISON:  Single-view of the chest 08/17/2016. FINDINGS: There is cardiomegaly with mild vascular congestion. No consolidative process, pneumothorax or pleural effusion. No acute or focal bony abnormality. IMPRESSION: Cardiomegaly and pulmonary vascular congestion. Electronically Signed   By: Inge Rise M.D.   On: 03/20/2019 12:57   MM 3D SCREEN BREAST BILATERAL  Result Date: 03/01/2019 CLINICAL DATA:  Screening. EXAM: DIGITAL SCREENING BILATERAL MAMMOGRAM WITH TOMO AND CAD COMPARISON:  Previous exam(s). ACR Breast Density Category b: There are scattered areas of  fibroglandular density. FINDINGS: There are no findings suspicious for malignancy. Images were processed with CAD. IMPRESSION: No mammographic evidence of malignancy. A result letter of this screening mammogram will be mailed directly to the patient. RECOMMENDATION: Screening mammogram in one year. (Code:[id number]) BI-RADS CATEGORY  1: Negative. Electronically Signed   By: Franki Cabot M.D.   On: 03/01/2019 11:13   CT HEAD CODE STROKE WO CONTRAST  Result Date: 03/20/2019 CLINICAL DATA:  Code stroke.  Found unresponsive. EXAM: CT HEAD WITHOUT CONTRAST TECHNIQUE: Contiguous axial images were obtained from the base of the skull through the vertex without intravenous contrast. COMPARISON:  None. FINDINGS: Brain: There is no acute intracranial hemorrhage, mass effect, or edema. Gray-white differentiation is preserved. Patchy hypoattenuation in the supratentorial white matter is nonspecific but may reflect mild chronic microvascular ischemic changes. There is no extra-axial fluid collection. Ventricles and sulci are normal in size and configuration. Vascular: There is no hyperdense vessel. Mild intracranial atherosclerotic calcification is present at the skull base. Skull: Unremarkable. Sinuses/Orbits: Unremarkable. Other: Mastoid air cells are clear. ASPECTS [facility] Stroke Program Early CT Score) - Ganglionic level infarction (caudate, lentiform nuclei, internal capsule, insula, M1-M3 cortex): 7 - Supraganglionic infarction (M4-M6 cortex): 3 Total score (0-10 with 10 being normal): 10 IMPRESSION: 1. No acute intracranial hemorrhage or evidence of acute infarction. Mild chronic microvascular ischemic changes. 2. ASPECTS is 10. These results were communicated to Dr. Joni Fears At 11:39 amon 12/30/2020by text page via the [facility] messaging system. Electronically Signed   By: Macy Mis M.D.   On: 03/20/2019 11:42    Microbiology: Recent Results (from the past 240 hour(s))  Respiratory Panel by RT PCR (Flu A&B,  Covid) - Nasopharyngeal Swab     Status: None   Collection Time: 03/20/19 11:16 AM   Specimen: Nasopharyngeal Swab  Result Value Ref Range Status   SARS Coronavirus 2 by RT PCR NEGATIVE NEGATIVE Final    Comment: (NOTE) SARS-CoV-2 target nucleic acids are NOT DETECTED. The SARS-CoV-2 RNA is generally detectable in upper respiratoy specimens during the acute phase of infection. The lowest concentration of SARS-CoV-2 viral copies this assay can detect is 131 copies/mL. A negative result does not preclude SARS-Cov-2 infection and should not be used as the sole basis for treatment or other patient management decisions. A negative result may occur with  improper specimen collection/handling, submission of specimen other than nasopharyngeal swab, presence of viral mutation(s) within the areas targeted by this assay, and inadequate number of viral copies (<131 copies/mL). A negative result must be combined with clinical observations, patient history, and epidemiological information. The expected result is Negative. Fact Sheet for Patients:  PinkCheek.be Fact Sheet for Healthcare Providers:  GravelBags.it This test is not yet ap proved or cleared by the [place] FDA and  has been authorized for detection and/or diagnosis of SARS-CoV-2 by FDA under an Emergency Use Authorization (EUA). This EUA will remain  in effect (meaning this test can be used) for the duration of the COVID-19 declaration under Section 564(b)(1) of the Act, 21 U.S.C. section 360bbb-3(b)(1), unless the authorization is terminated or revoked sooner.    Influenza A by PCR NEGATIVE NEGATIVE Final   Influenza B by PCR NEGATIVE NEGATIVE Final    Comment: (NOTE) The Xpert Xpress SARS-CoV-2/FLU/RSV assay is intended as an aid in  the diagnosis of influenza from Nasopharyngeal swab specimens and  should not be used as a sole basis for treatment. Nasal washings and    aspirates are unacceptable for Xpert Xpress SARS-CoV-2/FLU/RSV  testing. Fact Sheet for Patients: PinkCheek.be Fact Sheet for Healthcare Providers: GravelBags.it This test is not yet approved or cleared by the [place] FDA and  has been authorized for detection and/or diagnosis of SARS-CoV-2 by  FDA under an Emergency Use Authorization (EUA). This EUA will remain  in effect (meaning this test can be used) for the duration of the  Covid-19 declaration under Section 564(b)(1) of the Act, 21  U.S.C. section 360bbb-3(b)(1), unless the authorization is  terminated or revoked. Performed at [facility], [address] [place], [place] [address]      Labs: Basic Metabolic Panel: Recent Labs  Lab 03/20/19 1110 03/21/19 1044  NA 138 142  K 5.5* 4.7  CL 107 111  CO2 24 25  GLUCOSE 199* 296*  BUN 18 17  CREATININE 1.12* 1.15*  CALCIUM 9.6 10.2   Liver Function Tests: Recent Labs  Lab 03/20/19 1110  AST 24  ALT 8  ALKPHOS 77  BILITOT 1.3*  PROT 6.5  ALBUMIN 3.2*   CBC: Recent Labs  Lab 03/20/19 1110  WBC 10.5  NEUTROABS 8.4*  HGB 10.6*  HCT 35.2*  MCV 88.7  PLT 322    CBG: Recent Labs  Lab 03/21/19 0131 03/21/19 0535 03/21/19 1008 03/21/19 1240 03/21/19 1640  GLUCAP 85 101* 241* 164* 142*    Principal Problem:   Acute encephalopathy Active Problems:   Chronic systolic heart failure ([place])   Controlled type 2 diabetes mellitus without complication ([place])   Diabetic neuropathy ([place])   Time coordinating discharge: 35 minutes  Signed:  Murray Hodgkins, MD  [facility]  03/21/2019, 5:47 PM

## 2019-03-21 NOTE — Progress Notes (Signed)
Subjective: Patient awake and alert.  Appears at baseline.    Objective: Current vital signs: BP (!) 81/65   Pulse (!) 119   Temp 98.3 F (36.8 C) (Oral)   Resp (!) 21   Ht 5\' 8"  (1.727 m)   Wt 95.3 kg   LMP  (LMP Unknown)   SpO2 93%   BMI 31.93 kg/m  Vital signs in last 24 hours: Temp:  [98.3 F (36.8 C)] 98.3 F (36.8 C) (12/30 2005) Pulse Rate:  [69-119] 119 (12/31 0930) Resp:  [10-22] 21 (12/31 0930) BP: (81-137)/(52-115) 81/65 (12/31 0930) SpO2:  [89 %-100 %] 93 % (12/31 0930) Weight:  [95.3 kg] 95.3 kg (12/30 1429)  Intake/Output from previous day: 12/30 0701 - 12/31 0700 In: 200 [I.V.:200] Out: 3400 [Urine:3400] Intake/Output this shift: Total I/O In: 829.4 [I.V.:829.4] Out: -  Nutritional status:  Diet Order            Diet regular Room service appropriate? Yes; Fluid consistency: Thin  Diet effective now              Neurologic Exam: Mental Status: Alert and alert.  Speech fluent without evidence of aphasia.  Able to follow commands. Cranial Nerves: II: Visual fields grossly normal, pupils equal, round, reactive to light and accommodation III,IV, VI: ptosis not present, extra-ocular motions intact bilaterally V,VII: smile symmetric, facial light touch sensation normal bilaterally VIII: hearing normal bilaterally IX,X: gag reflex present XI: bilateral shoulder shrug XII: midline tongue extension Motor: Right : Upper extremity   5/5    Left:     Upper extremity   5/5  Lower extremity   5/5     Lower extremity   5/5 Tone and bulk:normal tone throughout; no atrophy noted Sensory: Pinprick and light touch intact throughout, bilaterally  Lab Results: Basic Metabolic Panel: Recent Labs  Lab 03/20/19 1110  NA 138  K 5.5*  CL 107  CO2 24  GLUCOSE 199*  BUN 18  CREATININE 1.12*  CALCIUM 9.6    Liver Function Tests: Recent Labs  Lab 03/20/19 1110  AST 24  ALT 8  ALKPHOS 77  BILITOT 1.3*  PROT 6.5  ALBUMIN 3.2*   No results for  input(s): LIPASE, AMYLASE in the last 168 hours. No results for input(s): AMMONIA in the last 168 hours.  CBC: Recent Labs  Lab 03/20/19 1110  WBC 10.5  NEUTROABS 8.4*  HGB 10.6*  HCT 35.2*  MCV 88.7  PLT 322    Cardiac Enzymes: No results for input(s): CKTOTAL, CKMB, CKMBINDEX, TROPONINI in the last 168 hours.  Lipid Panel: No results for input(s): CHOL, TRIG, HDL, CHOLHDL, VLDL, LDLCALC in the last 168 hours.  CBG: Recent Labs  Lab 03/20/19 1727 03/20/19 2253 03/21/19 0131 03/21/19 0535 03/21/19 1008  GLUCAP 110* 97 85 101* 241*    Microbiology: Results for orders placed or performed during the hospital encounter of 03/20/19  Respiratory Panel by RT PCR (Flu A&B, Covid) - Nasopharyngeal Swab     Status: None   Collection Time: 03/20/19 11:16 AM   Specimen: Nasopharyngeal Swab  Result Value Ref Range Status   SARS Coronavirus 2 by RT PCR NEGATIVE NEGATIVE Final    Comment: (NOTE) SARS-CoV-2 target nucleic acids are NOT DETECTED. The SARS-CoV-2 RNA is generally detectable in upper respiratoy specimens during the acute phase of infection. The lowest concentration of SARS-CoV-2 viral copies this assay can detect is 131 copies/mL. A negative result does not preclude SARS-Cov-2 infection and should not be used  as the sole basis for treatment or other patient management decisions. A negative result may occur with  improper specimen collection/handling, submission of specimen other than nasopharyngeal swab, presence of viral mutation(s) within the areas targeted by this assay, and inadequate number of viral copies (<131 copies/mL). A negative result must be combined with clinical observations, patient history, and epidemiological information. The expected result is Negative. Fact Sheet for Patients:  PinkCheek.be Fact Sheet for Healthcare Providers:  GravelBags.it This test is not yet ap proved or cleared by  the [place] FDA and  has been authorized for detection and/or diagnosis of SARS-CoV-2 by FDA under an Emergency Use Authorization (EUA). This EUA will remain  in effect (meaning this test can be used) for the duration of the COVID-19 declaration under Section 564(b)(1) of the Act, 21 U.S.C. section 360bbb-3(b)(1), unless the authorization is terminated or revoked sooner.    Influenza A by PCR NEGATIVE NEGATIVE Final   Influenza B by PCR NEGATIVE NEGATIVE Final    Comment: (NOTE) The Xpert Xpress SARS-CoV-2/FLU/RSV assay is intended as an aid in  the diagnosis of influenza from Nasopharyngeal swab specimens and  should not be used as a sole basis for treatment. Nasal washings and  aspirates are unacceptable for Xpert Xpress SARS-CoV-2/FLU/RSV  testing. Fact Sheet for Patients: PinkCheek.be Fact Sheet for Healthcare Providers: GravelBags.it This test is not yet approved or cleared by the [place] FDA and  has been authorized for detection and/or diagnosis of SARS-CoV-2 by  FDA under an Emergency Use Authorization (EUA). This EUA will remain  in effect (meaning this test can be used) for the duration of the  Covid-19 declaration under Section 564(b)(1) of the Act, 21  U.S.C. section 360bbb-3(b)(1), unless the authorization is  terminated or revoked. Performed at [facility], [address] [place], [place] [address]     Coagulation Studies: Recent Labs    03/20/19 1110  LABPROT 12.8  INR 1.0    Imaging: EEG  Result Date: 03/20/2019 Alexis Goodell, MD     03/20/2019  7:06 PM ELECTROENCEPHALOGRAM REPORT Patient: Elizabeth Gutierrez       Room #: [id number] EEG No. ID: [id number] Age: 58 y.o.        Sex: female Referring Physician: Sarajane Jews Report Date:  03/20/2019       Interpreting Physician: Alexis Goodell History: Elizabeth Gutierrez is an 58 y.o. female presenting unresponsive Medications: Lithium, Synthroid,  Insulin Conditions of Recording:  This is a 21 channel routine scalp EEG performed with bipolar and monopolar montages arranged in accordance to the international 10/20 system of electrode placement. One channel was dedicated to EKG recording. The patient is in the awake and drowsy states. Description:  Artifact is prominent during the recording often obscuring the background rhythm. When able to be visualized the waking background activity consists of a low voltage, symmetrical, fairly well organized, 8 Hz alpha activity, seen from the parieto-occipital and posterior temporal regions.  Low voltage fast activity, poorly organized, is seen anteriorly and is at times superimposed on more posterior regions.  A mixture of theta and alpha rhythms are seen from the central and temporal regions. The patient drowses with slowing to irregular, low voltage theta and beta activity.  Stage II sleep is not obtained. No epileptiform activity is noted.  Hyperventilation was not performed. Intermittent photic stimulation was performed but failed to illicit any change in the tracing.  IMPRESSION: Normal electroencephalogram, awake, drowsy and with activation procedures. There are no focal  lateralizing or epileptiform features. Alexis Goodell, MD Neurology [phone] 03/20/2019, 7:03 PM   CT ANGIO HEAD W OR WO CONTRAST  Result Date: 03/20/2019 CLINICAL DATA:  Stroke, follow-up. EXAM: CT ANGIOGRAPHY HEAD AND NECK CT PERFUSION BRAIN TECHNIQUE: Multidetector CT imaging of the head and neck was performed using the standard protocol during bolus administration of intravenous contrast. Multiplanar CT image reconstructions and MIPs were obtained to evaluate the vascular anatomy. Carotid stenosis measurements (when applicable) are obtained utilizing NASCET criteria, using the distal internal carotid diameter as the denominator. Multiphase CT imaging of the brain was performed following IV bolus contrast injection. Subsequent  parametric perfusion maps were calculated using RAPID software. CONTRAST:  115mL OMNIPAQUE IOHEXOL 350 MG/ML SOLN COMPARISON:  Noncontrast head CT performed earlier the same day FINDINGS: CT HEAD FINDINGS Brain: No evidence of acute intracranial hemorrhage. No demarcated cortical infarction. No evidence of intracranial mass. No midline shift or extra-axial fluid collection. Mild ill-defined hypoattenuation within the cerebral white matter is nonspecific, but consistent with chronic small vessel ischemic disease. Cerebral volume is normal for age. Vascular: Reported separately Skull: Normal. Negative for fracture or focal lesion. Sinuses/Orbits: Visualized orbits demonstrate no acute abnormality. Small mucous retention cyst within the inferior left maxillary sinus. No significant mastoid effusion. ASPECTS ([place] Stroke Program Early CT Score) - Ganglionic level infarction (caudate, lentiform nuclei, internal capsule, insula, M1-M3 cortex): 7 - Supraganglionic infarction (M4-M6 cortex): 3 Total score (0-10 with 10 being normal): 10 Review of the MIP images confirms the above findings CTA NECK FINDINGS Aortic arch: Standard aortic branching. Right carotid system: CCA and ICA patent within the neck without stenosis. No significant atherosclerotic disease. Left carotid system: CCA and ICA patent within the neck without stenosis. Vertebral arteries: The left vertebral artery is slightly dominant. The vertebral arteries are patent within the neck bilaterally without significant stenosis. Skeleton: No acute bony abnormality. Other neck: Prominent thyroid gland. There is a nodule within the left thyroid lobe measuring 1.2 cm. There also scattered coarse calcifications within the thyroid gland. Borderline enlarged left level III lymph node measuring 10 mm in short axis, nonspecific (series 8, image 196) Upper chest: Patchy atelectasis within the imaged lung apices. Review of the MIP images confirms the above findings CTA  HEAD FINDINGS Anterior circulation: The intracranial internal carotid arteries are patent with scattered calcified plaque but no significant stenosis. The bilateral middle and anterior cerebral arteries are patent without significant proximal stenosis. No intracranial aneurysm is identified. Posterior circulation: The intracranial vertebral arteries are patent without significant stenosis. The non dominant right vertebral artery is developmentally diminutive beyond the origin of the right PICA. The basilar artery is patent without significant stenosis. The posterior cerebral arteries are patent bilaterally without significant proximal stenosis. Small bilateral posterior communicating arteries are present. Venous sinuses: Within limitations of contrast timing, no convincing thrombus. Anatomic variants: None significant Review of the MIP images confirms the above findings CT Brain Perfusion Findings: ASPECTS: 10 CBF (<30%) Volume: 15mL Perfusion (Tmax>6.0s) volume: 60mL Mismatch Volume: None identified Infarction Location:None These results were called by telephone at the time of interpretation on 03/20/2019 at 1:47 pm to provider Dr. Joni Fears, who verbally acknowledged these results. IMPRESSION: CT head: 1. No evidence of acute intracranial abnormality. 2. Mild chronic small vessel ischemic disease. CTA neck: 1. The common carotid, internal carotid and vertebral arteries are patent within the neck without significant stenosis. No significant atherosclerotic disease. 2. Borderline enlarged left level III lymph node, nonspecific. Clinical correlation is recommended. 3. Enlarged thyroid gland  containing scattered coarse calcifications. 1.2 cm left thyroid lobe nodule. CTA head: No intracranial large vessel occlusion or proximal high-grade arterial stenosis. CT perfusion head: No core infarct is identified by the perfusion software. Additionally, the perfusion software demonstrates no region of critically hypoperfused  parenchyma utilizing the a Tmax>6 seconds threshold. Electronically Signed   By: Kellie Simmering DO   On: 03/20/2019 13:47   CT ANGIO NECK W OR WO CONTRAST  Result Date: 03/20/2019 CLINICAL DATA:  Stroke, follow-up. EXAM: CT ANGIOGRAPHY HEAD AND NECK CT PERFUSION BRAIN TECHNIQUE: Multidetector CT imaging of the head and neck was performed using the standard protocol during bolus administration of intravenous contrast. Multiplanar CT image reconstructions and MIPs were obtained to evaluate the vascular anatomy. Carotid stenosis measurements (when applicable) are obtained utilizing NASCET criteria, using the distal internal carotid diameter as the denominator. Multiphase CT imaging of the brain was performed following IV bolus contrast injection. Subsequent parametric perfusion maps were calculated using RAPID software. CONTRAST:  129mL OMNIPAQUE IOHEXOL 350 MG/ML SOLN COMPARISON:  Noncontrast head CT performed earlier the same day FINDINGS: CT HEAD FINDINGS Brain: No evidence of acute intracranial hemorrhage. No demarcated cortical infarction. No evidence of intracranial mass. No midline shift or extra-axial fluid collection. Mild ill-defined hypoattenuation within the cerebral white matter is nonspecific, but consistent with chronic small vessel ischemic disease. Cerebral volume is normal for age. Vascular: Reported separately Skull: Normal. Negative for fracture or focal lesion. Sinuses/Orbits: Visualized orbits demonstrate no acute abnormality. Small mucous retention cyst within the inferior left maxillary sinus. No significant mastoid effusion. ASPECTS ([place] Stroke Program Early CT Score) - Ganglionic level infarction (caudate, lentiform nuclei, internal capsule, insula, M1-M3 cortex): 7 - Supraganglionic infarction (M4-M6 cortex): 3 Total score (0-10 with 10 being normal): 10 Review of the MIP images confirms the above findings CTA NECK FINDINGS Aortic arch: Standard aortic branching. Right carotid system:  CCA and ICA patent within the neck without stenosis. No significant atherosclerotic disease. Left carotid system: CCA and ICA patent within the neck without stenosis. Vertebral arteries: The left vertebral artery is slightly dominant. The vertebral arteries are patent within the neck bilaterally without significant stenosis. Skeleton: No acute bony abnormality. Other neck: Prominent thyroid gland. There is a nodule within the left thyroid lobe measuring 1.2 cm. There also scattered coarse calcifications within the thyroid gland. Borderline enlarged left level III lymph node measuring 10 mm in short axis, nonspecific (series 8, image 196) Upper chest: Patchy atelectasis within the imaged lung apices. Review of the MIP images confirms the above findings CTA HEAD FINDINGS Anterior circulation: The intracranial internal carotid arteries are patent with scattered calcified plaque but no significant stenosis. The bilateral middle and anterior cerebral arteries are patent without significant proximal stenosis. No intracranial aneurysm is identified. Posterior circulation: The intracranial vertebral arteries are patent without significant stenosis. The non dominant right vertebral artery is developmentally diminutive beyond the origin of the right PICA. The basilar artery is patent without significant stenosis. The posterior cerebral arteries are patent bilaterally without significant proximal stenosis. Small bilateral posterior communicating arteries are present. Venous sinuses: Within limitations of contrast timing, no convincing thrombus. Anatomic variants: None significant Review of the MIP images confirms the above findings CT Brain Perfusion Findings: ASPECTS: 10 CBF (<30%) Volume: 57mL Perfusion (Tmax>6.0s) volume: 26mL Mismatch Volume: None identified Infarction Location:None These results were called by telephone at the time of interpretation on 03/20/2019 at 1:47 pm to provider Dr. Joni Fears, who verbally  acknowledged these results. IMPRESSION: CT head:  1. No evidence of acute intracranial abnormality. 2. Mild chronic small vessel ischemic disease. CTA neck: 1. The common carotid, internal carotid and vertebral arteries are patent within the neck without significant stenosis. No significant atherosclerotic disease. 2. Borderline enlarged left level III lymph node, nonspecific. Clinical correlation is recommended. 3. Enlarged thyroid gland containing scattered coarse calcifications. 1.2 cm left thyroid lobe nodule. CTA head: No intracranial large vessel occlusion or proximal high-grade arterial stenosis. CT perfusion head: No core infarct is identified by the perfusion software. Additionally, the perfusion software demonstrates no region of critically hypoperfused parenchyma utilizing the a Tmax>6 seconds threshold. Electronically Signed   By: Kellie Simmering DO   On: 03/20/2019 13:47   CT CEREBRAL PERFUSION W CONTRAST  Result Date: 03/20/2019 CLINICAL DATA:  Stroke, follow-up. EXAM: CT ANGIOGRAPHY HEAD AND NECK CT PERFUSION BRAIN TECHNIQUE: Multidetector CT imaging of the head and neck was performed using the standard protocol during bolus administration of intravenous contrast. Multiplanar CT image reconstructions and MIPs were obtained to evaluate the vascular anatomy. Carotid stenosis measurements (when applicable) are obtained utilizing NASCET criteria, using the distal internal carotid diameter as the denominator. Multiphase CT imaging of the brain was performed following IV bolus contrast injection. Subsequent parametric perfusion maps were calculated using RAPID software. CONTRAST:  114mL OMNIPAQUE IOHEXOL 350 MG/ML SOLN COMPARISON:  Noncontrast head CT performed earlier the same day FINDINGS: CT HEAD FINDINGS Brain: No evidence of acute intracranial hemorrhage. No demarcated cortical infarction. No evidence of intracranial mass. No midline shift or extra-axial fluid collection. Mild ill-defined  hypoattenuation within the cerebral white matter is nonspecific, but consistent with chronic small vessel ischemic disease. Cerebral volume is normal for age. Vascular: Reported separately Skull: Normal. Negative for fracture or focal lesion. Sinuses/Orbits: Visualized orbits demonstrate no acute abnormality. Small mucous retention cyst within the inferior left maxillary sinus. No significant mastoid effusion. ASPECTS ([place] Stroke Program Early CT Score) - Ganglionic level infarction (caudate, lentiform nuclei, internal capsule, insula, M1-M3 cortex): 7 - Supraganglionic infarction (M4-M6 cortex): 3 Total score (0-10 with 10 being normal): 10 Review of the MIP images confirms the above findings CTA NECK FINDINGS Aortic arch: Standard aortic branching. Right carotid system: CCA and ICA patent within the neck without stenosis. No significant atherosclerotic disease. Left carotid system: CCA and ICA patent within the neck without stenosis. Vertebral arteries: The left vertebral artery is slightly dominant. The vertebral arteries are patent within the neck bilaterally without significant stenosis. Skeleton: No acute bony abnormality. Other neck: Prominent thyroid gland. There is a nodule within the left thyroid lobe measuring 1.2 cm. There also scattered coarse calcifications within the thyroid gland. Borderline enlarged left level III lymph node measuring 10 mm in short axis, nonspecific (series 8, image 196) Upper chest: Patchy atelectasis within the imaged lung apices. Review of the MIP images confirms the above findings CTA HEAD FINDINGS Anterior circulation: The intracranial internal carotid arteries are patent with scattered calcified plaque but no significant stenosis. The bilateral middle and anterior cerebral arteries are patent without significant proximal stenosis. No intracranial aneurysm is identified. Posterior circulation: The intracranial vertebral arteries are patent without significant stenosis. The  non dominant right vertebral artery is developmentally diminutive beyond the origin of the right PICA. The basilar artery is patent without significant stenosis. The posterior cerebral arteries are patent bilaterally without significant proximal stenosis. Small bilateral posterior communicating arteries are present. Venous sinuses: Within limitations of contrast timing, no convincing thrombus. Anatomic variants: None significant Review of the MIP images confirms  the above findings CT Brain Perfusion Findings: ASPECTS: 10 CBF (<30%) Volume: 71mL Perfusion (Tmax>6.0s) volume: 41mL Mismatch Volume: None identified Infarction Location:None These results were called by telephone at the time of interpretation on 03/20/2019 at 1:47 pm to provider Dr. Joni Fears, who verbally acknowledged these results. IMPRESSION: CT head: 1. No evidence of acute intracranial abnormality. 2. Mild chronic small vessel ischemic disease. CTA neck: 1. The common carotid, internal carotid and vertebral arteries are patent within the neck without significant stenosis. No significant atherosclerotic disease. 2. Borderline enlarged left level III lymph node, nonspecific. Clinical correlation is recommended. 3. Enlarged thyroid gland containing scattered coarse calcifications. 1.2 cm left thyroid lobe nodule. CTA head: No intracranial large vessel occlusion or proximal high-grade arterial stenosis. CT perfusion head: No core infarct is identified by the perfusion software. Additionally, the perfusion software demonstrates no region of critically hypoperfused parenchyma utilizing the a Tmax>6 seconds threshold. Electronically Signed   By: Kellie Simmering DO   On: 03/20/2019 13:47   DG Chest Portable 1 View  Result Date: 03/20/2019 CLINICAL DATA:  Patient found unresponsive today. EXAM: PORTABLE CHEST 1 VIEW COMPARISON:  Single-view of the chest 08/17/2016. FINDINGS: There is cardiomegaly with mild vascular congestion. No consolidative process,  pneumothorax or pleural effusion. No acute or focal bony abnormality. IMPRESSION: Cardiomegaly and pulmonary vascular congestion. Electronically Signed   By: Inge Rise M.D.   On: 03/20/2019 12:57   CT HEAD CODE STROKE WO CONTRAST  Result Date: 03/20/2019 CLINICAL DATA:  Code stroke.  Found unresponsive. EXAM: CT HEAD WITHOUT CONTRAST TECHNIQUE: Contiguous axial images were obtained from the base of the skull through the vertex without intravenous contrast. COMPARISON:  None. FINDINGS: Brain: There is no acute intracranial hemorrhage, mass effect, or edema. Gray-white differentiation is preserved. Patchy hypoattenuation in the supratentorial white matter is nonspecific but may reflect mild chronic microvascular ischemic changes. There is no extra-axial fluid collection. Ventricles and sulci are normal in size and configuration. Vascular: There is no hyperdense vessel. Mild intracranial atherosclerotic calcification is present at the skull base. Skull: Unremarkable. Sinuses/Orbits: Unremarkable. Other: Mastoid air cells are clear. ASPECTS [facility] Stroke Program Early CT Score) - Ganglionic level infarction (caudate, lentiform nuclei, internal capsule, insula, M1-M3 cortex): 7 - Supraganglionic infarction (M4-M6 cortex): 3 Total score (0-10 with 10 being normal): 10 IMPRESSION: 1. No acute intracranial hemorrhage or evidence of acute infarction. Mild chronic microvascular ischemic changes. 2. ASPECTS is 10. These results were communicated to Dr. Joni Fears At 11:39 amon 12/30/2020by text page via the [facility] messaging system. Electronically Signed   By: Macy Mis M.D.   On: 03/20/2019 11:42    Medications:  I have reviewed the patient's current medications. Scheduled: .  stroke: mapping our early stages of recovery book   Does not apply Once  . insulin aspart  0-6 Units Subcutaneous Q6H  . levothyroxine  50 mcg Intravenous Daily    Assessment/Plan: 58 y.o. female with a history of CAD CHF  diabetes hypertension schizophrenia who was sent to the ED from her group home by EMS after being found unresponsive and hypotensive.  Code stroke called.  Initial Head CT, CTA and CTP were unremarkable.  UDS unremarkable.  EEG unremarkable.  EKG with sinus.  Patient now appears back to baseline.  Unclear etiology for presentation.   Lithium level pending  Recommendations: 1. MRI of the brain without contrast.  If unremarkable patient to follow up on an outpatient basis.     LOS: 0 days   Alexis Goodell,  MD Neurology [phone] 03/21/2019  10:49 AM

## 2019-03-21 NOTE — Evaluation (Signed)
Physical Therapy Evaluation Patient Details Name: Elizabeth Gutierrez MRN: [id number] DOB: 05-24-1960 Today's Date: 03/21/2019   History of Present Illness  From MD H&P: Pt is a 58 year old woman PMH schizophrenia, CAD, diabetes, mental retardation, resides in a group home, who reportedly was well at 7 AM, had blood sugar around 170, and then later that morning was found to be unresponsive, slumped over.  EMS reported blood sugar 200, hypotensive in the 80s which resolved with fluid bolus.  In the emergency department she was unresponsive and no further history was available.  She was treated as a code stroke and seen by neurology, exam was difficult, CT head and CT a head and neck were unrevealing.  Per neurology: Initial Head CT, CTA and CTP were unremarkable.  UDS unremarkable.  EEG unremarkable.  EKG with sinus.  Patient now appears back to baseline.  Unclear etiology for presentation.    Clinical Impression  Pt presented with min deficits in strength, transfers, mobility, gait, balance, and activity tolerance but overall performed well during the session and was pleasant throughout.  Per contact at the pt's group home the pt typically ambulates without an AD community distances but within the last two weeks has begun furniture cruising or using a rollator for support.  Pt required extra time and effort with bed mobility tasks and SBA with transfers.  Pt was able to amb 100' max with a RW but her gait quality quickly deteriorated as the walk progressed with decreased cadence and increased lean on the RW.  Pt required min A to prevent a LOB while turning with the RW and needed frequent verbal cues to amb closer to the RW and to stay within the walker during turns.  Pt reported no pain or other adverse symptoms during the session.  Pt will benefit from HHPT services upon discharge to safely address above deficits for decreased caregiver assistance and eventual return to PLOF as well as for decreased risk of  further functional decline.      Follow Up Recommendations Home health PT    Equipment Recommendations  Rolling walker with 5" wheels    Recommendations for Other Services       Precautions / Restrictions Precautions Precautions: Fall Restrictions Weight Bearing Restrictions: No      Mobility  Bed Mobility Overal bed mobility: Modified Independent             General bed mobility comments: Extra time and effort but no physical assistance required  Transfers Overall transfer level: Needs assistance Equipment used: Rolling walker (2 wheeled) Transfers: Sit to/from Stand Sit to Stand: Supervision;From elevated surface         General transfer comment: Good eccentric and concentric control from an elevated surface  Ambulation/Gait Ambulation/Gait assistance: Min assist Gait Distance (Feet): 100 Feet Assistive device: Rolling walker (2 wheeled) Gait Pattern/deviations: Step-through pattern;Drifts right/left;Trunk flexed Gait velocity: decreased   General Gait Details: Pt with min instability and drifting during amb with mod lean on the RW and required min A on one occasion to prevent LOB while turning  Stairs            Wheelchair Mobility    Modified Rankin (Stroke Patients Only)       Balance Overall balance assessment: Needs assistance   Sitting balance-Leahy Scale: Good     Standing balance support: Bilateral upper extremity supported;During functional activity Standing balance-Leahy Scale: Poor Standing balance comment: Min A to prevent LOB during amb with mod lean on a RW  for support                             Pertinent Vitals/Pain Pain Assessment: No/denies pain    Home Living Family/patient expects to be discharged to:: Group home                 Additional Comments: History/prior function provided by Benjamine Mola from pt's group home.  Pt has 24/hr assist at [facility] group home, no steps to enter.    Prior  Function Level of Independence: Needs assistance   Gait / Transfers Assistance Needed: Pt mostly Ind with amb without an AD but recently has started furniture cruising or using a rollator, one fall in the last year  ADL's / Homemaking Assistance Needed: Pt Ind with bathing and min A with dressing        Hand Dominance        Extremity/Trunk Assessment   Upper Extremity Assessment Upper Extremity Assessment: Generalized weakness    Lower Extremity Assessment Lower Extremity Assessment: Generalized weakness       Communication   Communication: No difficulties  Cognition Arousal/Alertness: Awake/alert Behavior During Therapy: WFL for tasks assessed/performed Overall Cognitive Status: No family/caregiver present to determine baseline cognitive functioning                                 General Comments: Pt with history of cognitive deficits but unclear if she is at baseline; pt pleasant and followed simple commands with occasional extra cuing      General Comments      Exercises Total Joint Exercises Ankle Circles/Pumps: Strengthening;Both;10 reps Quad Sets: Strengthening;Both;10 reps Short Arc Quad: Strengthening;Both;10 reps Heel Slides: Strengthening;Both;10 reps Hip ABduction/ADduction: Strengthening;Both;10 reps Straight Leg Raises: Strengthening;Both;10 reps Long Arc Quad: Strengthening;Both;10 reps Knee Flexion: Strengthening;Both;10 reps Marching in Standing: AROM;Strengthening;Both;10 reps;Standing   Assessment/Plan    PT Assessment Patient needs continued PT services  PT Problem List Decreased strength;Decreased balance;Decreased activity tolerance;Decreased mobility;Decreased knowledge of use of DME;Decreased safety awareness       PT Treatment Interventions DME instruction;Gait training;Functional mobility training;Therapeutic activities;Therapeutic exercise;Balance training;Patient/family education    PT Goals (Current goals can be  found in the Care Plan section)  Acute Rehab PT Goals Patient Stated Goal: To return to group home PT Goal Formulation: With patient Time For Goal Achievement: 04/03/19 Potential to Achieve Goals: Good    Frequency Min 2X/week   Barriers to discharge        Co-evaluation               AM-PAC PT "6 Clicks" Mobility  Outcome Measure Help needed turning from your back to your side while in a flat bed without using bedrails?: None Help needed moving from lying on your back to sitting on the side of a flat bed without using bedrails?: None Help needed moving to and from a bed to a chair (including a wheelchair)?: A Little Help needed standing up from a chair using your arms (e.g., wheelchair or bedside chair)?: A Little Help needed to walk in hospital room?: A Little Help needed climbing 3-5 steps with a railing? : A Little 6 Click Score: 20    End of Session Equipment Utilized During Treatment: Gait belt Activity Tolerance: Patient tolerated treatment well Patient left: in bed;with nursing/sitter in room;with call bell/phone within reach Nurse Communication: Mobility status PT Visit Diagnosis: Unsteadiness on  feet (R26.81);Difficulty in walking, not elsewhere classified (R26.2);Muscle weakness (generalized) (M62.81)    Time: 1400-1435 PT Time Calculation (min) (ACUTE ONLY): 35 min   Charges:   PT Evaluation $PT Eval Moderate Complexity: 1 Mod PT Treatments $Therapeutic Exercise: 8-22 mins        D. Royetta Asal PT, DPT 03/21/19, 5:27 PM

## 2019-03-22 NOTE — Progress Notes (Signed)
TOC CM referral for [facility], contacted Group Home Director, Elouise Munroe. States they have agency that does PT in their group homes. Requested CM fax orders to his office and he will arrange. States they have worked with pt in the past. [facility] PT orders faxed to group home.   Elouise Munroe [phone], fax [phone]  Jonnie Finner RN [place], [place] ED TOC CM [phone]

## 2019-05-20 ENCOUNTER — Other Ambulatory Visit: Payer: Self-pay

## 2019-05-20 ENCOUNTER — Ambulatory Visit (INDEPENDENT_AMBULATORY_CARE_PROVIDER_SITE_OTHER): Payer: Medicare Other | Admitting: Podiatry

## 2019-05-20 ENCOUNTER — Encounter: Payer: Self-pay | Admitting: Podiatry

## 2019-05-20 DIAGNOSIS — M79676 Pain in unspecified toe(s): Secondary | ICD-10-CM | POA: Diagnosis not present

## 2019-05-20 DIAGNOSIS — B351 Tinea unguium: Secondary | ICD-10-CM

## 2019-05-20 DIAGNOSIS — E1149 Type 2 diabetes mellitus with other diabetic neurological complication: Secondary | ICD-10-CM | POA: Diagnosis not present

## 2019-05-20 NOTE — Progress Notes (Signed)
Complaint:  Visit Type: Patient returns to my office for continued preventative foot care services. Complaint: Patient states" my nails have grown long and thick and become painful to walk and wear shoes" Patient has been diagnosed with DM with no foot complications. The patient presents for preventative foot care services. No changes to ROS.    Podiatric Exam: Vascular: dorsalis pedis and posterior tibial pulses are palpable bilateral. Capillary return is immediate. Temperature gradient is WNL. Skin turgor WNL  Sensorium: Normal Semmes Weinstein monofilament test. Normal tactile sensation bilaterally. Nail Exam: Pt has thick disfigured discolored nails with subungual debris noted bilateral entire nail hallux through fifth toenails Ulcer Exam: There is no evidence of ulcer or pre-ulcerative changes or infection. Orthopedic Exam: Muscle tone and strength are WNL. No limitations in general ROM. No crepitus or effusions noted. Foot type and digits show no abnormalities. Bony prominences are unremarkable. Pes planus. Skin: No Porokeratosis. No infection or ulcers  Diagnosis:  Onychomycosis, , Pain in right toe, pain in left toes,    Treatment & Plan Procedures and Treatment: Consent by patient was obtained for treatment procedures. The patient understood the discussion of treatment and procedures well. All questions were answered thoroughly reviewed. Debridement of mycotic and hypertrophic toenails, 1 through 5 bilateral and clearing of subungual debris. No ulceration, no infection noted. Return Visit-Office Procedure: Patient instructed to return to the office for a follow up visit 4 months for continued evaluation and treatment.    Gardiner Barefoot DPM

## 2019-09-19 ENCOUNTER — Emergency Department: Payer: Medicare Other

## 2019-09-19 ENCOUNTER — Inpatient Hospital Stay
Admission: EM | Admit: 2019-09-19 | Discharge: 2019-09-21 | DRG: 091 | Disposition: A | Discharge status: Home / Self Care | Payer: Medicare Other | Attending: Internal Medicine | Admitting: Internal Medicine

## 2019-09-19 ENCOUNTER — Other Ambulatory Visit: Payer: Self-pay

## 2019-09-19 ENCOUNTER — Ambulatory Visit: Payer: Medicare Other | Admitting: Podiatry

## 2019-09-19 ENCOUNTER — Encounter: Payer: Self-pay | Admitting: Emergency Medicine

## 2019-09-19 DIAGNOSIS — Z6833 Body mass index (BMI) 33.0-33.9, adult: Secondary | ICD-10-CM

## 2019-09-19 DIAGNOSIS — J9621 Acute and chronic respiratory failure with hypoxia: Secondary | ICD-10-CM | POA: Diagnosis present

## 2019-09-19 DIAGNOSIS — T43215A Adverse effect of selective serotonin and norepinephrine reuptake inhibitors, initial encounter: Secondary | ICD-10-CM | POA: Diagnosis present

## 2019-09-19 DIAGNOSIS — J690 Pneumonitis due to inhalation of food and vomit: Secondary | ICD-10-CM | POA: Diagnosis present

## 2019-09-19 DIAGNOSIS — G92 Toxic encephalopathy: Secondary | ICD-10-CM | POA: Diagnosis not present

## 2019-09-19 DIAGNOSIS — K2100 Gastro-esophageal reflux disease with esophagitis, without bleeding: Secondary | ICD-10-CM | POA: Diagnosis present

## 2019-09-19 DIAGNOSIS — Z79899 Other long term (current) drug therapy: Secondary | ICD-10-CM

## 2019-09-19 DIAGNOSIS — I10 Essential (primary) hypertension: Secondary | ICD-10-CM | POA: Diagnosis present

## 2019-09-19 DIAGNOSIS — E1122 Type 2 diabetes mellitus with diabetic chronic kidney disease: Secondary | ICD-10-CM | POA: Diagnosis present

## 2019-09-19 DIAGNOSIS — J9601 Acute respiratory failure with hypoxia: Secondary | ICD-10-CM | POA: Diagnosis present

## 2019-09-19 DIAGNOSIS — F70 Mild intellectual disabilities: Secondary | ICD-10-CM | POA: Diagnosis present

## 2019-09-19 DIAGNOSIS — F200 Paranoid schizophrenia: Secondary | ICD-10-CM | POA: Diagnosis present

## 2019-09-19 DIAGNOSIS — G9341 Metabolic encephalopathy: Secondary | ICD-10-CM | POA: Diagnosis present

## 2019-09-19 DIAGNOSIS — Z7982 Long term (current) use of aspirin: Secondary | ICD-10-CM

## 2019-09-19 DIAGNOSIS — E1140 Type 2 diabetes mellitus with diabetic neuropathy, unspecified: Secondary | ICD-10-CM | POA: Diagnosis present

## 2019-09-19 DIAGNOSIS — Z79890 Hormone replacement therapy (postmenopausal): Secondary | ICD-10-CM

## 2019-09-19 DIAGNOSIS — T426X5A Adverse effect of other antiepileptic and sedative-hypnotic drugs, initial encounter: Secondary | ICD-10-CM | POA: Diagnosis present

## 2019-09-19 DIAGNOSIS — R4182 Altered mental status, unspecified: Secondary | ICD-10-CM | POA: Diagnosis not present

## 2019-09-19 DIAGNOSIS — I2510 Atherosclerotic heart disease of native coronary artery without angina pectoris: Secondary | ICD-10-CM | POA: Diagnosis not present

## 2019-09-19 DIAGNOSIS — T43595A Adverse effect of other antipsychotics and neuroleptics, initial encounter: Secondary | ICD-10-CM | POA: Diagnosis present

## 2019-09-19 DIAGNOSIS — Z20822 Contact with and (suspected) exposure to covid-19: Secondary | ICD-10-CM | POA: Diagnosis present

## 2019-09-19 DIAGNOSIS — T43225A Adverse effect of selective serotonin reuptake inhibitors, initial encounter: Secondary | ICD-10-CM | POA: Diagnosis present

## 2019-09-19 DIAGNOSIS — T433X5A Adverse effect of phenothiazine antipsychotics and neuroleptics, initial encounter: Secondary | ICD-10-CM | POA: Diagnosis present

## 2019-09-19 DIAGNOSIS — N183 Chronic kidney disease, stage 3 unspecified: Secondary | ICD-10-CM | POA: Diagnosis present

## 2019-09-19 DIAGNOSIS — N1831 Chronic kidney disease, stage 3a: Secondary | ICD-10-CM | POA: Diagnosis present

## 2019-09-19 DIAGNOSIS — Z87891 Personal history of nicotine dependence: Secondary | ICD-10-CM

## 2019-09-19 DIAGNOSIS — T443X5A Adverse effect of other parasympatholytics [anticholinergics and antimuscarinics] and spasmolytics, initial encounter: Secondary | ICD-10-CM | POA: Diagnosis present

## 2019-09-19 DIAGNOSIS — I5042 Chronic combined systolic (congestive) and diastolic (congestive) heart failure: Secondary | ICD-10-CM | POA: Diagnosis present

## 2019-09-19 DIAGNOSIS — D649 Anemia, unspecified: Secondary | ICD-10-CM | POA: Diagnosis present

## 2019-09-19 DIAGNOSIS — G9340 Encephalopathy, unspecified: Secondary | ICD-10-CM

## 2019-09-19 DIAGNOSIS — E1169 Type 2 diabetes mellitus with other specified complication: Secondary | ICD-10-CM | POA: Diagnosis present

## 2019-09-19 DIAGNOSIS — E669 Obesity, unspecified: Secondary | ICD-10-CM | POA: Diagnosis present

## 2019-09-19 DIAGNOSIS — I252 Old myocardial infarction: Secondary | ICD-10-CM

## 2019-09-19 DIAGNOSIS — E119 Type 2 diabetes mellitus without complications: Secondary | ICD-10-CM

## 2019-09-19 DIAGNOSIS — E785 Hyperlipidemia, unspecified: Secondary | ICD-10-CM | POA: Diagnosis present

## 2019-09-19 DIAGNOSIS — E039 Hypothyroidism, unspecified: Secondary | ICD-10-CM | POA: Diagnosis present

## 2019-09-19 DIAGNOSIS — I5022 Chronic systolic (congestive) heart failure: Secondary | ICD-10-CM | POA: Diagnosis present

## 2019-09-19 DIAGNOSIS — I130 Hypertensive heart and chronic kidney disease with heart failure and stage 1 through stage 4 chronic kidney disease, or unspecified chronic kidney disease: Secondary | ICD-10-CM | POA: Diagnosis present

## 2019-09-19 DIAGNOSIS — D72829 Elevated white blood cell count, unspecified: Secondary | ICD-10-CM | POA: Diagnosis present

## 2019-09-19 DIAGNOSIS — Z7984 Long term (current) use of oral hypoglycemic drugs: Secondary | ICD-10-CM

## 2019-09-19 LAB — URINALYSIS, COMPLETE (UACMP) WITH MICROSCOPIC
Bacteria, UA: NONE SEEN
Bilirubin Urine: NEGATIVE
Glucose, UA: 50 mg/dL — AB
Hgb urine dipstick: NEGATIVE
Ketones, ur: NEGATIVE mg/dL
Leukocytes,Ua: NEGATIVE
Nitrite: NEGATIVE
Protein, ur: NEGATIVE mg/dL
Specific Gravity, Urine: 1.008 (ref 1.005–1.030)
Squamous Epithelial / HPF: NONE SEEN (ref 0–5)
pH: 7 (ref 5.0–8.0)

## 2019-09-19 LAB — CBC WITH DIFFERENTIAL/PLATELET
Abs Immature Granulocytes: 0.03 10*3/uL (ref 0.00–0.07)
Basophils Absolute: 0.1 10*3/uL (ref 0.0–0.1)
Basophils Relative: 1 %
Eosinophils Absolute: 0.2 10*3/uL (ref 0.0–0.5)
Eosinophils Relative: 2 %
HCT: 40.4 % (ref 36.0–46.0)
Hemoglobin: 11.7 g/dL — ABNORMAL LOW (ref 12.0–15.0)
Immature Granulocytes: 0 %
Lymphocytes Relative: 13 %
Lymphs Abs: 1.3 10*3/uL (ref 0.7–4.0)
MCH: 26.4 pg (ref 26.0–34.0)
MCHC: 29 g/dL — ABNORMAL LOW (ref 30.0–36.0)
MCV: 91 fL (ref 80.0–100.0)
Monocytes Absolute: 0.5 10*3/uL (ref 0.1–1.0)
Monocytes Relative: 5 %
Neutro Abs: 7.6 10*3/uL (ref 1.7–7.7)
Neutrophils Relative %: 79 %
Platelets: 332 10*3/uL (ref 150–400)
RBC: 4.44 MIL/uL (ref 3.87–5.11)
RDW: 15.2 % (ref 11.5–15.5)
WBC: 9.6 10*3/uL (ref 4.0–10.5)
nRBC: 0 % (ref 0.0–0.2)

## 2019-09-19 LAB — TSH: TSH: 0.504 u[IU]/mL (ref 0.350–4.500)

## 2019-09-19 LAB — URINE DRUG SCREEN, QUALITATIVE (ARMC ONLY)
Amphetamines, Ur Screen: NOT DETECTED
Barbiturates, Ur Screen: NOT DETECTED
Benzodiazepine, Ur Scrn: NOT DETECTED
Cannabinoid 50 Ng, Ur ~~LOC~~: NOT DETECTED
Cocaine Metabolite,Ur ~~LOC~~: NOT DETECTED
MDMA (Ecstasy)Ur Screen: NOT DETECTED
Methadone Scn, Ur: NOT DETECTED
Opiate, Ur Screen: NOT DETECTED
Phencyclidine (PCP) Ur S: NOT DETECTED
Tricyclic, Ur Screen: NOT DETECTED

## 2019-09-19 LAB — GLUCOSE, CAPILLARY
Glucose-Capillary: 215 mg/dL — ABNORMAL HIGH (ref 70–99)
Glucose-Capillary: 170 mg/dL — ABNORMAL HIGH (ref 70–99)
Glucose-Capillary: 182 mg/dL — ABNORMAL HIGH (ref 70–99)
Glucose-Capillary: 161 mg/dL — ABNORMAL HIGH (ref 70–99)

## 2019-09-19 LAB — COMPREHENSIVE METABOLIC PANEL
ALT: 13 U/L (ref 0–44)
AST: 12 U/L — ABNORMAL LOW (ref 15–41)
Albumin: 3.7 g/dL (ref 3.5–5.0)
Alkaline Phosphatase: 90 U/L (ref 38–126)
Anion gap: 5 (ref 5–15)
BUN: 18 mg/dL (ref 6–20)
CO2: 28 mmol/L (ref 22–32)
Calcium: 10.3 mg/dL (ref 8.9–10.3)
Chloride: 104 mmol/L (ref 98–111)
Creatinine, Ser: 1.26 mg/dL — ABNORMAL HIGH (ref 0.44–1.00)
GFR calc Af Amer: 54 mL/min — ABNORMAL LOW (ref >60)
GFR calc non Af Amer: 47 mL/min — ABNORMAL LOW (ref >60)
Glucose, Bld: 240 mg/dL — ABNORMAL HIGH (ref 70–99)
Potassium: 5 mmol/L (ref 3.5–5.1)
Sodium: 137 mmol/L (ref 135–145)
Total Bilirubin: 0.7 mg/dL (ref 0.3–1.2)
Total Protein: 7.4 g/dL (ref 6.5–8.1)

## 2019-09-19 LAB — TROPONIN I (HIGH SENSITIVITY)
Troponin I (High Sensitivity): 9 ng/L (ref <18)
Troponin I (High Sensitivity): 7 ng/L (ref <18)

## 2019-09-19 LAB — LITHIUM LEVEL: Lithium Lvl: 0.88 mmol/L (ref 0.60–1.20)

## 2019-09-19 LAB — SARS CORONAVIRUS 2 BY RT PCR (HOSPITAL ORDER, PERFORMED IN ~~LOC~~ HOSPITAL LAB): SARS Coronavirus 2: NEGATIVE

## 2019-09-19 LAB — T4, FREE: Free T4: 0.93 ng/dL (ref 0.61–1.12)

## 2019-09-19 LAB — SALICYLATE LEVEL: Salicylate Lvl: <7 mg/dL — ABNORMAL LOW (ref 7.0–30.0)

## 2019-09-19 LAB — ETHANOL: Alcohol, Ethyl (B): <10 mg/dL (ref <10)

## 2019-09-19 LAB — ACETAMINOPHEN LEVEL: Acetaminophen (Tylenol), Serum: <10 ug/mL — ABNORMAL LOW (ref 10–30)

## 2019-09-19 LAB — AMMONIA: Ammonia: <9 umol/L — ABNORMAL LOW (ref 9–35)

## 2019-09-19 LAB — MRSA PCR SCREENING: MRSA by PCR: NEGATIVE

## 2019-09-19 LAB — BRAIN NATRIURETIC PEPTIDE: B Natriuretic Peptide: 75.4 pg/mL (ref 0.0–100.0)

## 2019-09-19 MED ORDER — LEVOTHYROXINE SODIUM 100 MCG/5ML IV SOLN: 37.5000 ug | Freq: Every day | INTRAVENOUS | Status: DC

## 2019-09-19 MED ORDER — ALBUTEROL SULFATE (2.5 MG/3ML) 0.083% IN NEBU: 2.5000 mg | INHALATION_SOLUTION | RESPIRATORY_TRACT | Status: DC | PRN

## 2019-09-19 MED ORDER — ONDANSETRON HCL 4 MG/2ML IJ SOLN: 4.0000 mg | Freq: Four times a day (QID) | INTRAMUSCULAR | Status: DC | PRN

## 2019-09-19 MED ORDER — HYDRALAZINE HCL 20 MG/ML IJ SOLN: 5.0000 mg | INTRAMUSCULAR | Status: DC | PRN

## 2019-09-19 MED ORDER — ACETAMINOPHEN 325 MG PO TABS: 650.0000 mg | ORAL_TABLET | Freq: Four times a day (QID) | ORAL | Status: DC | PRN

## 2019-09-19 MED ORDER — ORAL CARE MOUTH RINSE
15.0000 mL | Freq: Two times a day (BID) | OROMUCOSAL | Status: DC
  Administered 2019-09-19 – 2019-09-21 (×3): 15 mL via OROMUCOSAL

## 2019-09-19 MED ORDER — ONDANSETRON HCL 4 MG PO TABS: 4.0000 mg | ORAL_TABLET | Freq: Four times a day (QID) | ORAL | Status: DC | PRN

## 2019-09-19 MED ORDER — ACETAMINOPHEN 650 MG RE SUPP: 650.0000 mg | Freq: Four times a day (QID) | RECTAL | Status: DC | PRN

## 2019-09-19 MED ORDER — INSULIN ASPART 100 UNIT/ML ~~LOC~~ SOLN
0.0000 [IU] | Freq: Three times a day (TID) | SUBCUTANEOUS | Status: DC
  Administered 2019-09-19 – 2019-09-20 (×3): 2 [IU] via SUBCUTANEOUS
  Administered 2019-09-20 (×2): 5 [IU] via SUBCUTANEOUS
  Administered 2019-09-21: 2 [IU] via SUBCUTANEOUS
  Administered 2019-09-21: 5 [IU] via SUBCUTANEOUS
  Filled 2019-09-19 (×7): qty 1

## 2019-09-19 MED ORDER — INSULIN ASPART 100 UNIT/ML ~~LOC~~ SOLN: 0.0000 [IU] | Freq: Every day | SUBCUTANEOUS | Status: DC

## 2019-09-19 MED ORDER — FUROSEMIDE 10 MG/ML IJ SOLN
20.0000 mg | Freq: Once | INTRAMUSCULAR | Status: AC
  Administered 2019-09-19: 20 mg via INTRAVENOUS
  Filled 2019-09-19: qty 4

## 2019-09-19 MED ORDER — ENOXAPARIN SODIUM 40 MG/0.4ML ~~LOC~~ SOLN
40.0000 mg | SUBCUTANEOUS | Status: DC
  Administered 2019-09-19 – 2019-09-20 (×2): 40 mg via SUBCUTANEOUS
  Filled 2019-09-19 (×2): qty 0.4

## 2019-09-19 NOTE — ED Notes (Signed)
Pt had soiled brief and bed with urine, no foul smell noted. Pt skin cleansed of urine, dried, clean dry gown, sheets, chucks and diaper applied, pure wik applied with Deneise Lever, RN assistance. Pt tolerated well. Verbalized discontent while we were cleaning her up, mumbling for [place] to "stop," however, garbled. Does respond to some verbal stimuli, however, unable to discern words at this time. Unable to complete full neuro status, pt not following commands at this time.

## 2019-09-19 NOTE — ED Notes (Signed)
Elizabeth Gutierrez [phone] [name] first contact (team leader). [name] [employer] [facility] [phone]

## 2019-09-19 NOTE — ED Notes (Signed)
This Rn provided report to mother.

## 2019-09-19 NOTE — ED Notes (Signed)
Caregiver no longer at bedside. Side rails up, bed locked and low, call light in reach.

## 2019-09-19 NOTE — ED Notes (Signed)
Caregiver at bedside. Pt resting in bed with eyes closed, rise and fall of chest noted.

## 2019-09-19 NOTE — Progress Notes (Addendum)
Patient admitted to 83. Disoriented x4, responds to voice, pulling on heart monitor so safety mitts were put on. Box #27 was verified with nurse tech. Patient also placed on low bed and floor mats in place. Attempted to call mother Lakayla Barrington but there was no answer. Admission profile can not be done at this time.   Update 1845: Spoke with mother Blanch Media at bedside and was able to complete admission profile. Patient still sleeping, mumbling at times. Explained to mother NPO status and updated on plan of care. Mother will take home pt's bractlet and watch.

## 2019-09-19 NOTE — ED Notes (Signed)
Pt on 2L [place], oxygen saturation dropped to 87%, mouth breathing.  Attempted to sit patient up, readjust patient, nasal trumpet placed, oxygen turned up in increments to 6L  without much improvement of oxygen level.  Patient placed on 15L [place] with oxygen going up to 94%.  Dr. Joan Mayans aware.

## 2019-09-19 NOTE — ED Notes (Signed)
Elizabeth Gutierrez (caretaker) here with patient [phone]

## 2019-09-19 NOTE — H&P (Signed)
History and Physical    Elizabeth Gutierrez:[id number] DOB: 1960-05-24 DOA: 09/19/2019  Referring MD/NP/PA:   PCP: Elizabeth Lank, MD   Patient coming from:  The patient is coming from group home.  At baseline, pt is dependent for most of ADL.        Chief Complaint: AMS  HPI: Elizabeth Gutierrez is a 59 y.o. female with medical history significant of paranoid schizophrenia, sCHF with EF 25-30%, DM, hypothyroidism, GERD, hyperlipidemia, GERD, mild mental retardation, CAD, heart attack, CKD-3, who presents with altered mental status.  Patient has AMS and is unable to provide accurate medical history, therefore, most of the history is obtained by discussing the case with ED physician, per EMS report, and with the nursing staff. I also called her mother by phone who provided limited information.  Patient resides in a group home. Per report, pt is normally talkative and interactive. Pt was found to be more confused than normal. She is somnolent, difficult to wake up. When I saw pt in ED, she is barely arousable.  She moves extremities on painful stimuli.  No facial droop or slurred speech noted.  Patient does not have active respiratory distress, cough, nausea, vomiting, diarrhea noted.  Not sure if patient has pain anywhere.  Her mother states that patient had a similar episode of confusion before.  ED Course: pt was found to have WBC 9.  6, BNP 75.4, troponin 9 -->7, negative UDS, Tylenol level less than 10, salicylate level less than 7, negative urinalysis, pending COVID-19 PCR, ammonia level less than 9, alcohol level less than 10, lithium level 0.88 which is therapeutic, slightly worsening renal function, temperature normal, blood pressure 117/75, heart rate 72, RR 16, oxygen saturation 87% on room air which improved to 100% on 2 L nasal cannula oxygen.  CT head is negative for acute intracranial abnormalities.  Chest x-ray showed stable cardiomegaly without new infiltration. Patient is placed on  progressive plan for observation.  MRI-brain.  1. Motion degraded examination without evidence of acute intracranial abnormality. 2. Mild chronic small vessel ischemic disease. 3. Chronic right cerebellar infarct.  Review of Systems: Could not be reviewed due to altered mental status.  Allergy: No Known Allergies  Past Medical History:  Diagnosis Date  . CHF (congestive heart failure) ([place])   . Coronary artery disease   . Diabetes (Elizabeth Gutierrez)   . Heart attack (Elizabeth Gutierrez)   . Hyperlipidemia   . Hypertension   . MR (mental retardation)    Mild  . Paranoid schizophrenia (Elizabeth Gutierrez)   . Thyroid disease     Past Surgical History:  Procedure Laterality Date  . FOOT SURGERY Right     Social History:  reports that she quit smoking about 6 years ago. She has quit using smokeless tobacco. She reports that she does not drink alcohol and does not use drugs.  Family History:  Family History  Problem Relation Age of Onset  . Alcohol abuse Father   . Breast cancer Neg Hx      Prior to Admission medications   Medication Sig Start Date End Date Taking? Authorizing Provider  acetaminophen (TYLENOL) 325 MG tablet Take 650 mg by mouth every 4 (four) hours as needed for moderate pain, fever or headache.    Yes [provider]  ARIPiprazole (ABILIFY) 30 MG tablet Take 30 mg by mouth in the morning.    Yes [provider]  aspirin 81 MG tablet Take 81 mg by mouth daily.   Yes [provider]  atorvastatin (LIPITOR) 20 MG tablet Take 20 mg by mouth at bedtime.    Yes [provider]  benztropine (COGENTIN) 0.5 MG tablet Take 0.5 mg by mouth 2 (two) times daily.   Yes [provider]  carvedilol (COREG) 3.125 MG tablet Take 3.125 mg by mouth 2 (two) times daily. 12/26/18  Yes [provider]  dextromethorphan-guaiFENesin (DIABETIC TUSSIN DM) 10-100 MG/5ML liquid Take by mouth every 4 (four) hours as needed for cough.   Yes [provider]  docusate  sodium (COLACE) 100 MG capsule Take 100 mg by mouth 2 (two) times daily as needed for mild constipation.    Yes [provider]  fluPHENAZine decanoate (PROLIXIN) 25 MG/ML injection Inject 31.25 mg into the muscle every 14 (fourteen) days.    Yes [provider]  fluticasone (FLONASE) 50 MCG/ACT nasal spray Place 2 sprays into both nostrils daily.    Yes [provider]  gabapentin (NEURONTIN) 100 MG capsule Take 200 mg by mouth 3 (three) times daily.   Yes [provider]  levothyroxine (SYNTHROID, LEVOTHROID) 75 MCG tablet Take 75 mcg by mouth daily before breakfast.   Yes [provider]  lithium carbonate (LITHOBID) 300 MG CR tablet Take 300 mg by mouth every 12 (twelve) hours.    Yes [provider]  metFORMIN (GLUCOPHAGE-XR) 500 MG 24 hr tablet Take 1,000 mg by mouth daily with breakfast.   Yes [provider]  naproxen (NAPROSYN) 500 MG tablet Take 500 mg by mouth 2 (two) times daily with a meal.   Yes [provider]  naproxen sodium (ANAPROX) 550 MG tablet Take by mouth. 09/09/19  Yes [provider]  nystatin cream (MYCOSTATIN) Apply 1 application topically 4 (four) times daily.    Yes [provider]  omeprazole (PRILOSEC) 20 MG capsule Take 20 mg by mouth 2 (two) times daily.  12/26/18  Yes [provider]  polyethylene glycol (MIRALAX) 17 g packet Take 17 g by mouth daily.    Yes [provider]  senna (SENOKOT) 8.6 MG tablet Take 1 tablet by mouth at bedtime.    Yes [provider]  sertraline (ZOLOFT) 100 MG tablet Take 100 mg by mouth in the morning.    Yes [provider]  traZODone (DESYREL) 50 MG tablet Take 1 tablet (50 mg total) by mouth at bedtime. 08/19/16  Yes Elizabeth Grayer, MD    Physical Exam: Vitals:   09/19/19 6295 09/19/19 0842 09/19/19 0843 09/19/19 0844  BP:      Pulse: 79 80 80 80  Resp: 17 19 15 16   Temp:      TempSrc:      SpO2: 100%  100% 98% 99%  Weight:      Height:       General: Not in acute distress HEENT:       Eyes: PERRL, EOMI, no scleral icterus.       ENT: No discharge from the ears and nose, no pharynx injection, no tonsillar enlargement.        Neck: No JVD, no bruit, no mass felt. Heme: No neck lymph node enlargement. Cardiac: S1/S2, RRR, No murmurs, No gallops or rubs. Respiratory: has scattered rhonchi bilaterally GI: Soft, nondistended, nontender, no rebound pain, no organomegaly, BS present. GU: No hematuria Ext: has trace pitting leg edema bilaterally. 1+DP/PT pulse bilaterally. Musculoskeletal: No joint deformities, No joint redness or warmth, no limitation of ROM in spin. Skin: No rashes.  Neuro: Barely  arousable, not following command, not oriented X3, cranial nerves II-XII grossly intact, moves extremities on painful stimuli Psych: Patient is not psychotic.  Labs on Admission: I have personally reviewed following labs and imaging studies  CBC: Recent Labs  Lab 09/19/19 0236  WBC 9.6  NEUTROABS 7.6  HGB 11.7*  HCT 40.4  MCV 91.0  PLT 188   Basic Metabolic Panel: Recent Labs  Lab 09/19/19 0236  NA 137  K 5.0  CL 104  CO2 28  GLUCOSE 240*  BUN 18  CREATININE 1.26*  CALCIUM 10.3   GFR: Estimated Creatinine Clearance: 60.8 mL/min (A) (by C-G formula based on SCr of 1.26 mg/dL (H)). Liver Function Tests: Recent Labs  Lab 09/19/19 0236  AST 12*  ALT 13  ALKPHOS 90  BILITOT 0.7  PROT 7.4  ALBUMIN 3.7   No results for input(s): LIPASE, AMYLASE in the last 168 hours. Recent Labs  Lab 09/19/19 0238  AMMONIA <9*   Coagulation Profile: No results for input(s): INR, PROTIME in the last 168 hours. Cardiac Enzymes: No results for input(s): CKTOTAL, CKMB, CKMBINDEX, TROPONINI in the last 168 hours. BNP (last 3 results) No results for input(s): PROBNP in the last 8760 hours. HbA1C: No results for input(s): HGBA1C in the last 72 hours. CBG: Recent Labs  Lab  09/19/19 0232  GLUCAP 215*   Lipid Profile: No results for input(s): CHOL, HDL, LDLCALC, TRIG, CHOLHDL, LDLDIRECT in the last 72 hours. Thyroid Function Tests: Recent Labs    09/19/19 0505  TSH 0.504  FREET4 0.93   Anemia Panel: No results for input(s): VITAMINB12, FOLATE, FERRITIN, TIBC, IRON, RETICCTPCT in the last 72 hours. Urine analysis:    Component Value Date/Time   COLORURINE STRAW (A) 09/19/2019 0246   APPEARANCEUR CLEAR (A) 09/19/2019 0246   APPEARANCEUR Clear 04/23/2014 1009   LABSPEC 1.008 09/19/2019 0246   LABSPEC 1.003 04/23/2014 1009   PHURINE 7.0 09/19/2019 0246   GLUCOSEU 50 (A) 09/19/2019 0246   GLUCOSEU Negative 04/23/2014 1009   HGBUR NEGATIVE 09/19/2019 0246   BILIRUBINUR NEGATIVE 09/19/2019 0246   BILIRUBINUR Negative 04/23/2014 1009   KETONESUR NEGATIVE 09/19/2019 0246   PROTEINUR NEGATIVE 09/19/2019 0246   NITRITE NEGATIVE 09/19/2019 0246   LEUKOCYTESUR NEGATIVE 09/19/2019 0246   LEUKOCYTESUR Negative 04/23/2014 1009   Sepsis Labs: @LABRCNTIP (procalcitonin:4,lacticidven:4) )No results found for this or any previous visit (from the past 240 hour(s)).   Radiological Exams on Admission: CT HEAD WO CONTRAST  Result Date: 09/19/2019 CLINICAL DATA:  Altered level of consciousness EXAM: CT HEAD WITHOUT CONTRAST TECHNIQUE: Contiguous axial images were obtained from the base of the skull through the vertex without intravenous contrast. COMPARISON:  03/20/2019 FINDINGS: Brain: No acute infarct or hemorrhage. Stable minimal hypodensity in the periventricular white matter compatible with chronic small vessel ischemic change. Lateral ventricles and midline structures are unremarkable. No acute extra-axial fluid collections. No mass effect. Vascular: No hyperdense vessel or unexpected calcification. Skull: Normal. Negative for fracture or focal lesion. Sinuses/Orbits: No acute finding. Other: None. IMPRESSION: 1. Stable head CT, no acute process. Electronically  Signed   By: Randa Ngo M.D.   On: 09/19/2019 03:35   MR Brain Wo Contrast (neuro protocol)  Result Date: 09/19/2019 CLINICAL DATA:  Encephalopathy. EXAM: MRI HEAD WITHOUT CONTRAST TECHNIQUE: Multiplanar, multiecho pulse sequences of the brain and surrounding structures were obtained without intravenous contrast. COMPARISON:  Head CT 09/19/2019 and MRI 03/21/2019 FINDINGS: Multiple sequences are moderately motion degraded despite the use of faster, more motion resistant imaging protocols.  Brain: There is no evidence of acute infarct, intracranial hemorrhage, mass, midline shift, or extra-axial fluid collection. The ventricles and sulci are within normal limits for age. T2 hyperintensities in the cerebral white matter bilaterally are similar to the prior MRI and are nonspecific but compatible with mild chronic small vessel ischemic disease. A small chronic right cerebellar infarct is again noted. Vascular: Major intracranial vascular flow voids are preserved. Skull and upper cervical spine: Grossly unremarkable bone marrow signal. Sinuses/Orbits: Unremarkable orbits. Minimal left maxillary sinus mucosal thickening. Clear mastoid air cells. Other: None. IMPRESSION: 1. Motion degraded examination without evidence of acute intracranial abnormality. 2. Mild chronic small vessel ischemic disease. 3. Chronic right cerebellar infarct. Electronically Signed   By: Logan Bores M.D.   On: 09/19/2019 08:22   DG Chest Port 1 View  Result Date: 09/19/2019 CLINICAL DATA:  Initial evaluation for acute altered mental status. EXAM: PORTABLE CHEST 1 VIEW COMPARISON:  Prior radiograph from 03/20/2019. FINDINGS: Cardiomegaly, stable.  Mediastinal silhouette within normal limits. Lungs are hypoinflated. Secondary mild diffuse bronchovascular crowding. No focal infiltrates. No pulmonary edema or pleural effusion. No pneumothorax. No acute osseous finding. IMPRESSION: 1. Shallow lung inflation with secondary mild diffuse  bronchovascular crowding. 2. No other active cardiopulmonary disease. 3. Stable cardiomegaly without overt pulmonary edema. Electronically Signed   By: Jeannine Boga M.D.   On: 09/19/2019 03:02     EKG: Independently reviewed.  Seems to be sinus rhythm, QTC 479, LAD, right bundle blockade, poor R wave progression  Assessment/Plan Principal Problem:   Acute metabolic encephalopathy Active Problems:   Chronic systolic heart failure (HCC)   CAD in native artery   Benign essential HTN   Esophagitis, reflux   Hyperlipidemia   Paranoid schizophrenia ([facility])   Type II diabetes mellitus with renal manifestations ([facility])   Hypothyroidism   CKD (chronic kidney disease), stage IIIa   Acute respiratory failure with hypoxia ([facility])   Acute metabolic encephalopathy: Etiology is not clear.  Ammonia level less than 9, alcohol level less than 10, lithium level 0.88 which is therapeutic.  No signs of infection.  CT head negative.  MRI of the brain is limited study, but negative for acute intracranial issues.  A potential differential diagnosis is polypharmacy.  Patient is on multiple sedative medications, including Abilify, Cogentin, fluphenazine, Neurontin, lithium, Zoloft, trazodone.  -will place on progressive plan of observation -Hold all sedative medications -Keep patient n.p.o. until mental status improves -Frequent neuro check -If not improving, will need to consult neurology.  Chronic systolic heart failure ([place]): 2D echo on 08/19/2016 showed EF of 25-30%.  Patient has 1+ leg edema and oxygen desaturation, indicating possible CHF exacerbation. BNP 75.4 which is likely falsely low due to obesity. CXR showed mild diffuse bronchovascular crowding. - will 20 mg of lasix now - watch renal function and volume status closely  Acute respiratory failure with hypoxia: pt is not using oxygen at home per her mother.  No signs of infection for pneumonia.  Possibly due to CHF. -Continue nasal cannula  oxygen to maintain oxygen saturation above 93% -As needed albuterol nebulizers  CAD in native artery: -hold oral ASA and lipitor until mental status improves  Benign essential HTN -Hold oral Coreg until mental status improves -IV hydralazine as needed  Esophagitis, reflux -Hold oral omeprazole  Hyperlipidemia -Hold Lipitor  Paranoid schizophrenia ([place]) -Hold home medications until mental status improves  Type II diabetes mellitus with renal manifestations ([place]): Most recent A1c 7.1, poorly controled. Patient is taking Metformin at  home -SSI  Hypothyroidism: TSH 0.504, T4 0.93 which are all normal -Switch oral Synthroid to IV, cut dose from 75 to 37.5 mcg daily  CKD (chronic kidney disease), stage IIIa: Recent baseline creatinine 1.15 on 03/21/2019.  Her creatinine is 1.26, BUN 18.  Slightly worsening -Follow-up renal function by BMP        DVT ppx:  SQ Lovenox Code Status: Full code per her mother Family Communication:  Yes, patient's mother at bed side Disposition Plan:  Anticipate discharge back to previous group home environment Consults called:  none Admission status:  progressive unit for obs        Status is: Observation  The patient remains OBS appropriate and will d/c before 2 midnights.  Dispo: The patient is from: Group home              Anticipated d/c is to: Group home              Anticipated d/c date is: 1 day              Patient currently is not medically stable to d/c.          Date of Service 09/19/2019    Ivor Costa [facility]   If 7PM-7AM, please contact night-coverage [email or web address] 09/19/2019, 8:49 AM

## 2019-09-19 NOTE — ED Notes (Signed)
Pt moving all extremities, unable to verbally arouse at times, pt does respond to painful stimuli, maintaining airway, no distress noted at this time, occasional "junky" sounding cough. Bed locked and low, call light in reach.

## 2019-09-19 NOTE — ED Notes (Signed)
EDP Niu notified of pt ST at 120  at this time.

## 2019-09-19 NOTE — ED Triage Notes (Signed)
Pt to ED via EMS from group home, [facility], c/o AMS, c/o "episodes" that my pertain to her sleep?  Pt alert to painful stimuli, moaning and groaning but not answering questions.  Dr. Joan Mayans at bedside with caregiver from group home.

## 2019-09-19 NOTE — ED Provider Notes (Addendum)
[facility] Emergency Department Provider Note  ____________________________________________   First MD Initiated Contact with Patient 09/19/19 0234     (approximate)  I have reviewed the triage vital signs and the nursing notes.  History  Chief Complaint Altered Mental Status    HPI Elizabeth Gutierrez is a 59 y.o. female past medical history as below, including paranoid schizophrenia, HF, DM, hypothyroidism, GERD who presents to the emergency department for altered mental status.  Patient resides in a group home.  She is new to her current group home, moved there within the last 2 weeks.  Today she seemed more confused than normal.  Somnolent, difficult to wake up and more confused.  Per group home staff she is normally much more talkative and interactive.  Apparently, according to the group home she was at previously, she had a history of "episodes" that were similar to this.  Per caregiver from her group home, patient was otherwise in normal state of health until today.  Taking her meds appropriately.  Eating and drinking like normal.  No recent fevers, vomiting, diarrhea, cough, difficulty breathing.  Patient is somnolent, able to wake up with painful stimuli, protecting her airway.  However, unable to provide any history.  Caveat: History primarily obtained from EMS and patient's caregiver from her group home.  Limited due to patient's altered mental status.   Past Medical Hx Past Medical History:  Diagnosis Date  . CHF (congestive heart failure) ([place])   . Coronary artery disease   . Diabetes ([place])   . Heart attack ([place])   . Hyperlipidemia   . Hypertension   . MR (mental retardation)    Mild  . Paranoid schizophrenia ([place])   . Thyroid disease     Problem List Patient Active Problem List   Diagnosis Date Noted  . Acute encephalopathy 03/20/2019  . Altered mental state 08/17/2016  . DDD (degenerative disc disease), lumbar 07/09/2015  . Facet  syndrome, lumbar 07/09/2015  . Lumbar radiculopathy 07/09/2015  . Sacroiliac joint dysfunction 07/09/2015  . Spinal stenosis, lumbar region, 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  . Diabetic neuropathy ([place]) 07/09/2015  . CAD in native artery 06/16/2015  . Controlled type 2 diabetes mellitus without complication ([place]) 32/20/2542  . Benign essential HTN 06/16/2015  . Obstructive apnea 01/05/2015  . Esophagitis, reflux 08/06/2014  . Edema leg 05/14/2014  . Chronic systolic heart failure ([place]) 05/14/2014  . TI (tricuspid incompetence) 05/14/2014  . Chest pain 12/13/2013  . Combined fat and carbohydrate induced hyperlipemia 10/09/2013  . Breath shortness 10/09/2013    Past Surgical Hx Past Surgical History:  Procedure Laterality Date  . FOOT SURGERY Right     Medications Prior to Admission medications   Medication Sig Start Date End Date Taking? Authorizing Provider  acetaminophen (TYLENOL) 325 MG tablet Take 650 mg by mouth every 4 (four) hours as needed for moderate pain, fever or headache.     [provider]  Alcohol Swabs PADS  04/18/19   [provider]  ARIPiprazole (ABILIFY) 30 MG tablet Take 30 mg by mouth daily.     [provider]  aspirin 81 MG tablet Take 81 mg by mouth daily.    [provider]  atorvastatin (LIPITOR) 20 MG tablet Take 20 mg by mouth at bedtime.     [provider]  benztropine (COGENTIN) 0.5 MG tablet Take 0.5 mg by mouth 2 (two) times daily.    [provider]  carvedilol (COREG) 3.125 MG tablet  Take 3.125 mg by mouth 2 (two) times daily. 12/26/18   [provider]  dextromethorphan-guaiFENesin (TUSSIN DM) 10-100 MG/5ML liquid Take 10 mLs by mouth every 6 (six) hours as needed for cough.    [provider]  docusate sodium (COLACE) 100 MG capsule Take 100 mg by mouth 2 (two) times daily as needed for mild constipation.     [provider]  fluPHENAZine decanoate (PROLIXIN)  25 MG/ML injection Inject 31.25 mg into the muscle every 14 (fourteen) days.     [provider]  fluticasone (FLONASE) 50 MCG/ACT nasal spray Place 2 sprays into both nostrils daily.     [provider]  gabapentin (NEURONTIN) 100 MG capsule Take 200 mg by mouth 3 (three) times daily.    [provider]  levothyroxine (SYNTHROID, LEVOTHROID) 75 MCG tablet Take 75 mcg by mouth daily before breakfast.    [provider]  lithium carbonate (LITHOBID) 300 MG CR tablet Take 300 mg by mouth every 12 (twelve) hours.     [provider]  metFORMIN (GLUCOPHAGE-XR) 500 MG 24 hr tablet Take 1,000 mg by mouth daily with breakfast.    [provider]  naproxen (NAPROSYN) 500 MG tablet Take 500 mg by mouth 2 (two) times daily with a meal.    [provider]  nystatin cream (MYCOSTATIN) Apply 1 application topically 4 (four) times daily.     [provider]  omeprazole (PRILOSEC) 20 MG capsule Take 20 mg by mouth 2 (two) times daily.  12/26/18   [provider]  pioglitazone (ACTOS) 15 MG tablet Take 15 mg by mouth daily.    [provider]  polyethylene glycol (MIRALAX) 17 g packet Take 17 g by mouth daily.     [provider]  senna (SENOKOT) 8.6 MG tablet Take 1 tablet by mouth at bedtime.     [provider]  sertraline (ZOLOFT) 100 MG tablet Take 100 mg by mouth daily.    [provider]  traZODone (DESYREL) 50 MG tablet Take 1 tablet (50 mg total) by mouth at bedtime. 08/19/16   Loletha Grayer, MD    Allergies Patient has no known allergies.  Family Hx Family History  Problem Relation Age of Onset  . Alcohol abuse Father   . Breast cancer Neg Hx     Social Hx Social History   Tobacco Use  . Smoking status: Former Smoker    Quit date: 07/08/2013    Years since quitting: 6.2  . Smokeless tobacco: Former Network engineer Use Topics  . Alcohol use: No    Alcohol/week: 0.0 standard  drinks  . Drug use: No     Review of Systems Unable to reliably obtain due to patient's altered mental status.   Physical Exam  Vital Signs: ED Triage Vitals  Enc Vitals Group     BP 09/19/19 0243 (!) 127/92     Pulse Rate 09/19/19 0243 71     Resp 09/19/19 0243 18     Temp 09/19/19 0243 97.6 F (36.4 C)     Temp Source 09/19/19 0243 Oral     SpO2 09/19/19 0243 98 %     Weight 09/19/19 0249 230 lb (104.3 kg)     Height 09/19/19 0249 5\' 8"  (1.727 m)     Head Circumference --      Peak Flow --      Pain Score 09/19/19 0247 0     Pain Loc --  Pain Edu? --      Excl. in [place]? --      Constitutional: Sleeping, somnolent, reacts to painful stimuli. Head: Normocephalic. Atraumatic. Eyes: Conjunctivae clear. Sclera anicteric. Pupils equal and symmetric. Nose: No masses or lesions. No congestion or rhinorrhea. Mouth/Throat: Wearing mask.  Neck: No stridor. Trachea midline.  Cardiovascular: Normal rate, regular rhythm. Extremities well perfused. Respiratory: Normal respiratory effort.  Lungs clear anteriorly, though exam admittedly limited due to patient's body habitus.  Arrives on 2 L [place] with EMS for positional hypoxia.  Does desaturate when sleeping to the upper 80s/low 90s, which improves with supplemental oxygen. Gastrointestinal: Soft. Non-distended. Non-tender.  Genitourinary: Deferred. Musculoskeletal: Symmetric, mild, bilateral lower extremity edema. Neurologic: Somnolent, sleeping, difficult to arouse.  Reacts to painful stimuli.  Does appear to be protecting her airway and moves all extremities equally. Skin: Skin is warm, dry and intact. No rash noted. Psychiatric: Unable to assess.  EKG  Personally reviewed and interpreted by myself.   Date: 09/19/2019 Time: 0233 Rate: 76 Rhythm: Sinus Axis: Normal Intervals: IVCD No STEMI    Radiology  Personally reviewed available imaging myself.   CXR - IMPRESSION:  1. Shallow lung inflation with secondary mild  diffuse  bronchovascular crowding.  2. No other active cardiopulmonary disease.  3. Stable cardiomegaly without overt pulmonary edema.   CT head - IMPRESSION:  1. Stable head CT, no acute process.    Procedures  Procedure(s) performed (including critical care):  Procedures   Initial Impression / Assessment and Plan / MDM / ED Course  59 y.o. female who presents to the ED for altered mental status, as above  Ddx: UTI, other infectious etiology, metabolic encephalopathy, electrolyte abnormality, lithium toxicity, intracranial abnormality, uremia, thyroid, atypical seizure, polypharmacy   Will plan for labs, imaging  Work-up thus far has been largely unrevealing in terms of the etiology of her altered mental status.  CT head is negative.  UDS negative and urine negative for infection.  Ammonia negative.  Troponin and delta troponin both negative.  No significant uremia.  Lithium level appropriate. Thyroid studies added on, pending.  On reevaluation she is still very somnolent and does not appear to be back to her baseline.  As such, will plan for admission for further work-up. Discussed w/ hospitalist for admission, ordered MRI as well.   _______________________________   As part of my medical decision making I have reviewed available labs, radiology tests, reviewed old records/performed chart review, obtained additional history from EMS + group home caregiver.    Final Clinical Impression(s) / ED Diagnosis  Encephalopathy     Note:  This document was prepared using Dragon voice recognition software and may include unintentional dictation errors.     Lilia Pro., MD 09/19/19 [phone]

## 2019-09-19 NOTE — ED Notes (Signed)
EKG revised by EDP Jari Pigg and notified MD  Blaine Hamper. Placed in pt's chart.

## 2019-09-20 ENCOUNTER — Observation Stay: Payer: Medicare Other

## 2019-09-20 DIAGNOSIS — I5022 Chronic systolic (congestive) heart failure: Secondary | ICD-10-CM | POA: Diagnosis present

## 2019-09-20 DIAGNOSIS — D72829 Elevated white blood cell count, unspecified: Secondary | ICD-10-CM | POA: Diagnosis present

## 2019-09-20 DIAGNOSIS — I10 Essential (primary) hypertension: Secondary | ICD-10-CM | POA: Diagnosis not present

## 2019-09-20 DIAGNOSIS — N1831 Chronic kidney disease, stage 3a: Secondary | ICD-10-CM | POA: Diagnosis present

## 2019-09-20 DIAGNOSIS — E1122 Type 2 diabetes mellitus with diabetic chronic kidney disease: Secondary | ICD-10-CM | POA: Diagnosis present

## 2019-09-20 DIAGNOSIS — Z79899 Other long term (current) drug therapy: Secondary | ICD-10-CM | POA: Diagnosis not present

## 2019-09-20 DIAGNOSIS — F200 Paranoid schizophrenia: Secondary | ICD-10-CM | POA: Diagnosis present

## 2019-09-20 DIAGNOSIS — Z7982 Long term (current) use of aspirin: Secondary | ICD-10-CM | POA: Diagnosis not present

## 2019-09-20 DIAGNOSIS — I252 Old myocardial infarction: Secondary | ICD-10-CM | POA: Diagnosis not present

## 2019-09-20 DIAGNOSIS — Z79890 Hormone replacement therapy (postmenopausal): Secondary | ICD-10-CM | POA: Diagnosis not present

## 2019-09-20 DIAGNOSIS — R4182 Altered mental status, unspecified: Secondary | ICD-10-CM | POA: Diagnosis present

## 2019-09-20 DIAGNOSIS — Z20822 Contact with and (suspected) exposure to covid-19: Secondary | ICD-10-CM | POA: Diagnosis present

## 2019-09-20 DIAGNOSIS — I2510 Atherosclerotic heart disease of native coronary artery without angina pectoris: Secondary | ICD-10-CM | POA: Diagnosis present

## 2019-09-20 DIAGNOSIS — Z7984 Long term (current) use of oral hypoglycemic drugs: Secondary | ICD-10-CM | POA: Diagnosis not present

## 2019-09-20 DIAGNOSIS — E785 Hyperlipidemia, unspecified: Secondary | ICD-10-CM | POA: Diagnosis present

## 2019-09-20 DIAGNOSIS — J9601 Acute respiratory failure with hypoxia: Secondary | ICD-10-CM | POA: Diagnosis present

## 2019-09-20 DIAGNOSIS — K2100 Gastro-esophageal reflux disease with esophagitis, without bleeding: Secondary | ICD-10-CM | POA: Diagnosis present

## 2019-09-20 DIAGNOSIS — Z6833 Body mass index (BMI) 33.0-33.9, adult: Secondary | ICD-10-CM | POA: Diagnosis not present

## 2019-09-20 DIAGNOSIS — E1140 Type 2 diabetes mellitus with diabetic neuropathy, unspecified: Secondary | ICD-10-CM | POA: Diagnosis present

## 2019-09-20 DIAGNOSIS — G9341 Metabolic encephalopathy: Secondary | ICD-10-CM | POA: Diagnosis not present

## 2019-09-20 DIAGNOSIS — J690 Pneumonitis due to inhalation of food and vomit: Secondary | ICD-10-CM | POA: Diagnosis present

## 2019-09-20 DIAGNOSIS — I130 Hypertensive heart and chronic kidney disease with heart failure and stage 1 through stage 4 chronic kidney disease, or unspecified chronic kidney disease: Secondary | ICD-10-CM | POA: Diagnosis present

## 2019-09-20 DIAGNOSIS — G92 Toxic encephalopathy: Secondary | ICD-10-CM | POA: Diagnosis present

## 2019-09-20 DIAGNOSIS — E669 Obesity, unspecified: Secondary | ICD-10-CM | POA: Diagnosis present

## 2019-09-20 DIAGNOSIS — Z87891 Personal history of nicotine dependence: Secondary | ICD-10-CM | POA: Diagnosis not present

## 2019-09-20 DIAGNOSIS — T43595A Adverse effect of other antipsychotics and neuroleptics, initial encounter: Secondary | ICD-10-CM | POA: Diagnosis present

## 2019-09-20 DIAGNOSIS — E039 Hypothyroidism, unspecified: Secondary | ICD-10-CM | POA: Diagnosis present

## 2019-09-20 LAB — CBC
HCT: 40.9 % (ref 36.0–46.0)
HCT: 38 % (ref 36.0–46.0)
Hemoglobin: 12.3 g/dL (ref 12.0–15.0)
Hemoglobin: 11.6 g/dL — ABNORMAL LOW (ref 12.0–15.0)
MCH: 26.5 pg (ref 26.0–34.0)
MCH: 26.5 pg (ref 26.0–34.0)
MCHC: 30.1 g/dL (ref 30.0–36.0)
MCHC: 30.5 g/dL (ref 30.0–36.0)
MCV: 88 fL (ref 80.0–100.0)
MCV: 86.8 fL (ref 80.0–100.0)
Platelets: 388 10*3/uL (ref 150–400)
Platelets: 414 10*3/uL — ABNORMAL HIGH (ref 150–400)
RBC: 4.65 MIL/uL (ref 3.87–5.11)
RBC: 4.38 MIL/uL (ref 3.87–5.11)
RDW: 15.1 % (ref 11.5–15.5)
RDW: 15 % (ref 11.5–15.5)
WBC: 23.9 10*3/uL — ABNORMAL HIGH (ref 4.0–10.5)
WBC: 23.3 10*3/uL — ABNORMAL HIGH (ref 4.0–10.5)
nRBC: 0 % (ref 0.0–0.2)
nRBC: 0 % (ref 0.0–0.2)

## 2019-09-20 LAB — GLUCOSE, CAPILLARY
Glucose-Capillary: 151 mg/dL — ABNORMAL HIGH (ref 70–99)
Glucose-Capillary: 292 mg/dL — ABNORMAL HIGH (ref 70–99)
Glucose-Capillary: 281 mg/dL — ABNORMAL HIGH (ref 70–99)
Glucose-Capillary: 197 mg/dL — ABNORMAL HIGH (ref 70–99)

## 2019-09-20 LAB — BASIC METABOLIC PANEL
Anion gap: 11 (ref 5–15)
BUN: 18 mg/dL (ref 6–20)
CO2: 27 mmol/L (ref 22–32)
Calcium: 10.7 mg/dL — ABNORMAL HIGH (ref 8.9–10.3)
Chloride: 104 mmol/L (ref 98–111)
Creatinine, Ser: 0.99 mg/dL (ref 0.44–1.00)
GFR calc Af Amer: >60 mL/min (ref >60)
GFR calc non Af Amer: >60 mL/min (ref >60)
Glucose, Bld: 186 mg/dL — ABNORMAL HIGH (ref 70–99)
Potassium: 4.6 mmol/L (ref 3.5–5.1)
Sodium: 142 mmol/L (ref 135–145)

## 2019-09-20 LAB — PROCALCITONIN: Procalcitonin: 1.84 ng/mL

## 2019-09-20 MED ORDER — LITHIUM CARBONATE ER 300 MG PO TBCR
300.0000 mg | EXTENDED_RELEASE_TABLET | Freq: Two times a day (BID) | ORAL | Status: DC
  Filled 2019-09-20: qty 1

## 2019-09-20 MED ORDER — METFORMIN HCL ER 500 MG PO TB24
1000.0000 mg | ORAL_TABLET | Freq: Every day | ORAL | Status: DC
  Administered 2019-09-21: 1000 mg via ORAL
  Filled 2019-09-20: qty 2

## 2019-09-20 MED ORDER — SENNA 8.6 MG PO TABS
1.0000 | ORAL_TABLET | Freq: Every day | ORAL | Status: DC
  Administered 2019-09-20: 8.6 mg via ORAL
  Filled 2019-09-20: qty 1

## 2019-09-20 MED ORDER — ATORVASTATIN CALCIUM 20 MG PO TABS
20.0000 mg | ORAL_TABLET | Freq: Every day | ORAL | Status: DC
  Administered 2019-09-20: 20 mg via ORAL
  Filled 2019-09-20: qty 1

## 2019-09-20 MED ORDER — NYSTATIN 100000 UNIT/GM EX CREA
1.0000 "application " | TOPICAL_CREAM | Freq: Four times a day (QID) | CUTANEOUS | Status: DC
  Administered 2019-09-20 (×2): 1 via TOPICAL
  Filled 2019-09-20: qty 15

## 2019-09-20 MED ORDER — DEXTROMETHORPHAN-GUAIFENESIN 10-100 MG/5ML PO LIQD
5.0000 mL | Freq: Four times a day (QID) | ORAL | Status: DC | PRN
  Filled 2019-09-20 (×2): qty 5

## 2019-09-20 MED ORDER — LITHIUM CARBONATE ER 300 MG PO TBCR
300.0000 mg | EXTENDED_RELEASE_TABLET | Freq: Two times a day (BID) | ORAL | Status: DC
  Administered 2019-09-20 – 2019-09-21 (×2): 300 mg via ORAL
  Filled 2019-09-20 (×3): qty 1

## 2019-09-20 MED ORDER — POLYETHYLENE GLYCOL 3350 17 G PO PACK
17.0000 g | PACK | Freq: Every day | ORAL | Status: DC
  Administered 2019-09-21: 17 g via ORAL
  Filled 2019-09-20: qty 1

## 2019-09-20 MED ORDER — ARIPIPRAZOLE 15 MG PO TABS: 30.0000 mg | ORAL_TABLET | Freq: Every morning | ORAL | Status: DC

## 2019-09-20 MED ORDER — ASPIRIN EC 81 MG PO TBEC
81.0000 mg | DELAYED_RELEASE_TABLET | Freq: Every day | ORAL | Status: DC
  Administered 2019-09-21: 81 mg via ORAL
  Filled 2019-09-20: qty 1

## 2019-09-20 MED ORDER — NAPROXEN 500 MG PO TABS
500.0000 mg | ORAL_TABLET | Freq: Two times a day (BID) | ORAL | Status: DC
  Administered 2019-09-21: 500 mg via ORAL
  Filled 2019-09-20 (×2): qty 1

## 2019-09-20 MED ORDER — LEVOTHYROXINE SODIUM 50 MCG PO TABS
75.0000 ug | ORAL_TABLET | Freq: Every day | ORAL | Status: DC
  Administered 2019-09-21: 75 ug via ORAL
  Filled 2019-09-20: qty 1

## 2019-09-20 MED ORDER — FLUPHENAZINE DECANOATE 25 MG/ML IJ SOLN
31.2500 mg | INTRAMUSCULAR | Status: DC
  Administered 2019-09-20: 32.5 mg via INTRAMUSCULAR
  Filled 2019-09-20: qty 1.3

## 2019-09-20 MED ORDER — BENZTROPINE MESYLATE 0.5 MG PO TABS
0.5000 mg | ORAL_TABLET | Freq: Two times a day (BID) | ORAL | Status: DC
  Administered 2019-09-20 – 2019-09-21 (×2): 0.5 mg via ORAL
  Filled 2019-09-20 (×3): qty 1

## 2019-09-20 MED ORDER — LEVOTHYROXINE SODIUM 75 MCG PO TABS
75.0000 ug | ORAL_TABLET | Freq: Every day | ORAL | Status: DC
  Administered 2019-09-20: 75 ug via ORAL
  Filled 2019-09-20: qty 3
  Filled 2019-09-20: qty 1

## 2019-09-20 MED ORDER — CARVEDILOL 3.125 MG PO TABS
3.1250 mg | ORAL_TABLET | Freq: Two times a day (BID) | ORAL | Status: DC
  Administered 2019-09-20 – 2019-09-21 (×3): 3.125 mg via ORAL
  Filled 2019-09-20 (×3): qty 1

## 2019-09-20 MED ORDER — SERTRALINE HCL 50 MG PO TABS
100.0000 mg | ORAL_TABLET | Freq: Every morning | ORAL | Status: DC
  Administered 2019-09-21: 100 mg via ORAL
  Filled 2019-09-20: qty 2

## 2019-09-20 MED ORDER — ARIPIPRAZOLE 15 MG PO TABS
30.0000 mg | ORAL_TABLET | Freq: Every day | ORAL | Status: DC
  Administered 2019-09-20: 30 mg via ORAL
  Filled 2019-09-20 (×2): qty 2

## 2019-09-20 MED ORDER — FLUTICASONE PROPIONATE 50 MCG/ACT NA SUSP
2.0000 | Freq: Every day | NASAL | Status: DC
  Administered 2019-09-21: 2 via NASAL
  Filled 2019-09-20: qty 16

## 2019-09-20 MED ORDER — DOCUSATE SODIUM 100 MG PO CAPS: 100.0000 mg | ORAL_CAPSULE | Freq: Two times a day (BID) | ORAL | Status: DC | PRN

## 2019-09-20 MED ORDER — ARIPIPRAZOLE 30 MG PO TABS: 30.0000 mg | ORAL_TABLET | Freq: Every day | ORAL | 0 refills | Status: DC

## 2019-09-20 MED ORDER — AMOXICILLIN-POT CLAVULANATE 875-125 MG PO TABS
1.0000 | ORAL_TABLET | Freq: Two times a day (BID) | ORAL | Status: DC
  Administered 2019-09-20 – 2019-09-21 (×2): 1 via ORAL
  Filled 2019-09-20 (×2): qty 1

## 2019-09-20 MED ORDER — ACETAMINOPHEN 325 MG PO TABS: 650.0000 mg | ORAL_TABLET | ORAL | Status: DC | PRN

## 2019-09-20 MED ORDER — GABAPENTIN 100 MG PO CAPS
200.0000 mg | ORAL_CAPSULE | Freq: Three times a day (TID) | ORAL | Status: DC
  Administered 2019-09-20 – 2019-09-21 (×3): 200 mg via ORAL
  Filled 2019-09-20 (×3): qty 2

## 2019-09-20 MED ORDER — PANTOPRAZOLE SODIUM 40 MG PO TBEC
40.0000 mg | DELAYED_RELEASE_TABLET | Freq: Every day | ORAL | Status: DC
  Administered 2019-09-21: 40 mg via ORAL
  Filled 2019-09-20: qty 1

## 2019-09-20 MED ORDER — TRAZODONE HCL 50 MG PO TABS
50.0000 mg | ORAL_TABLET | Freq: Every day | ORAL | Status: DC
  Administered 2019-09-20: 50 mg via ORAL
  Filled 2019-09-20: qty 1

## 2019-09-20 NOTE — Progress Notes (Addendum)
Patient more alert and oriented this morning. Sitting up in the chair, eating all meals. No complaints at this time.   Spoke with Mr. Elouise Munroe (Director of [name]) . Contact information for patient's nurse at [facility] given, Lottie Rater [phone], and asked to call her to go over patient's care. Attempted to call, no answer, will attempt again later.   Update 1404: Spoke with Butch Penny and got a history on patient from her and updated her as well. Also spoke with Benjamine Mola, one of the coordinators, and updated her.  Patient ambulated to the bathroom with walker. MD updated, patient will be discharging later this evening. Spoke with mom and updated her as well. Mom will be picking up patient.   Update 1653: MD updated on patient's STAT CBC results.   Update 1809: MD made aware pt's xray results in. MD cancelled discharge orders, new orders put in. Mom at bedside updated and Butch Penny from [name] also updated.

## 2019-09-20 NOTE — Consult Note (Addendum)
[facility] Psychiatry Consult   Reason for Consult:  AMS/ ssues of chronic  schizophrenia  Referring Physician:  [facility]  Patient Identification: PAIZLIE KLAUS MRN:  [id number] Principal Diagnosis: Acute metabolic encephalopathy   Diagnosis:  Principal Problem:   Acute metabolic encephalopathy Active Problems:   Chronic systolic heart failure (HCC)   CAD in native artery   Benign essential HTN   Esophagitis, reflux   Hyperlipidemia   Paranoid schizophrenia ([place])   Type II diabetes mellitus with renal manifestations ([place])   Hypothyroidism   CKD (chronic kidney disease), stage IIIa   Acute respiratory failure with hypoxia ([place])   Total Time spent with patient:   30-40 min   Called mom as well she does not have much info --also called group home.  Group home was concerned because she was sleepy today and hard to arouse ----possible due to her medicines vs other causes Spoke to her nurse at group home.     Subjective:     Elizabeth Gutierrez is a 59 y.o. female patient admitted with  AMS ---here ---for treatment   On Several Psych  Medicines, no recent changes    --followed every six months by Psychiatry outpatient  Now clearing up with Mental Status and her issues of AMS are not well known at this time   HPI:    Long term chronic schizophrenia --has been on current medications ---over some time ---Followed by clinic --in [facility] [place] ---  Past Psychiatric History:  Long chronic  With at least 20 past admissions including State admits    Risk to Self:   none  Risk to Others:  none  Prior Inpatient Therapy:  see above  Prior Outpatient Therapy:  Dr. Davonna Belling in [facility] ---  Past Medical History:  Past Medical History:  Diagnosis Date  . CHF (congestive heart failure) ([place])   . Coronary artery disease   . Diabetes ([place])   . Heart attack ([place])   . Hyperlipidemia   . Hypertension   . MR (mental retardation)    Mild  . Paranoid schizophrenia ([place])   . Thyroid disease      Past Surgical History:  Procedure Laterality Date  . FOOT SURGERY Right    Family History:  Family History  Problem Relation Age of Onset  . Alcohol abuse Father   . Breast cancer Neg Hx    Family Psychiatric  History:  None  Psych admits ---Many numerous ---[facility] age 23 ---  Substance Drug ETOH  issues none  Last Psych admit ---several years ago  At our facility   Court and Legal issues None   Other medical problems  See List ---    Social History:  Lives at group home, Mom visits, not married no kids, on disability  Social History   Substance and Sexual Activity  Alcohol Use No  . Alcohol/week: 0.0 standard drinks     Social History   Substance and Sexual Activity  Drug Use No    Social History   Socioeconomic History  . Marital status: Single    Spouse name: Not on file  . Number of children: Not on file  . Years of education: Not on file  . Highest education level: Not on file  Occupational History  . Not on file  Tobacco Use  . Smoking status: Former Smoker    Quit date: 07/08/2013    Years since quitting: 6.2  . Smokeless tobacco: Former Systems developer  Substance and Sexual Activity  . Alcohol use: No    Alcohol/week: 0.0 standard drinks  . Drug use: No  . Sexual activity: Not on file  Other Topics Concern  . Not on file  Social History Narrative  . Not on file   Social Determinants of Health   Financial Resource Strain:   . Difficulty of Paying Living Expenses:   Food Insecurity:   . Worried About Charity fundraiser in the Last Year:   . Arboriculturist in the Last Year:   Transportation Needs:   . Film/video editor (Medical):   Marland Kitchen Lack of Transportation (Non-Medical):   Physical Activity:   . Days of Exercise per Week:   . Minutes of Exercise per Session:   Stress:   . Feeling of Stress :   Social Connections:   . Frequency of Communication with Friends and Family:   . Frequency of Social Gatherings  with Friends and Family:   . Attends Religious Services:   . Active Member of Clubs or Organizations:   . Attends Archivist Meetings:   Marland Kitchen Marital Status:    Additional Social History:    Allergies:  No Known Allergies  Labs:  Results for orders placed or performed during the hospital encounter of 09/19/19 (from the past 48 hour(s))  Glucose, capillary     Status: Abnormal   Collection Time: 09/19/19  2:32 AM  Result Value Ref Range   Glucose-Capillary 215 (H) 70 - 99 mg/dL    Comment: Glucose reference range applies only to samples taken after fasting for at least 8 hours.  Comprehensive metabolic panel     Status: Abnormal   Collection Time: 09/19/19  2:36 AM  Result Value Ref Range   Sodium 137 135 - 145 mmol/L   Potassium 5.0 3.5 - 5.1 mmol/L   Chloride 104 98 - 111 mmol/L   CO2 28 22 - 32 mmol/L   Glucose, Bld 240 (H) 70 - 99 mg/dL    Comment: Glucose reference range applies only to samples taken after fasting for at least 8 hours.   BUN 18 6 - 20 mg/dL   Creatinine, Ser 1.26 (H) 0.44 - 1.00 mg/dL   Calcium 10.3 8.9 - 10.3 mg/dL   Total Protein 7.4 6.5 - 8.1 g/dL   Albumin 3.7 3.5 - 5.0 g/dL   AST 12 (L) 15 - 41 U/L   ALT 13 0 - 44 U/L   Alkaline Phosphatase 90 38 - 126 U/L   Total Bilirubin 0.7 0.3 - 1.2 mg/dL   GFR calc non Af Amer 47 (L) >60 mL/min   GFR calc Af Amer 54 (L) >60 mL/min   Anion gap 5 5 - 15    Comment: Performed at [facility], [address] [place], [place] [address]  CBC with Differential/Platelet     Status: Abnormal   Collection Time: 09/19/19  2:36 AM  Result Value Ref Range   WBC 9.6 4.0 - 10.5 K/uL   RBC 4.44 3.87 - 5.11 MIL/uL   Hemoglobin 11.7 (L) 12.0 - 15.0 g/dL   HCT 40.4 36 - 46 %   MCV 91.0 80.0 - 100.0 fL   MCH 26.4 26.0 - 34.0 pg   MCHC 29.0 (L) 30.0 - 36.0 g/dL   RDW 15.2 11.5 - 15.5 %   Platelets 332 150 - 400 K/uL   nRBC 0.0 0.0 - 0.2 %   Neutrophils Relative % 79 %  Neutro Abs 7.6 1.7 - 7.7 K/uL    Lymphocytes Relative 13 %   Lymphs Abs 1.3 0.7 - 4.0 K/uL   Monocytes Relative 5 %   Monocytes Absolute 0.5 0 - 1 K/uL   Eosinophils Relative 2 %   Eosinophils Absolute 0.2 0 - 0 K/uL   Basophils Relative 1 %   Basophils Absolute 0.1 0 - 0 K/uL   Immature Granulocytes 0 %   Abs Immature Granulocytes 0.03 0.00 - 0.07 K/uL    Comment: Performed at [facility], [address] [place], [place] [address]  Acetaminophen level     Status: Abnormal   Collection Time: 09/19/19  2:36 AM  Result Value Ref Range   Acetaminophen (Tylenol), Serum <10 (L) 10 - 30 ug/mL    Comment: (NOTE) Therapeutic concentrations vary significantly. A range of 10-30 ug/mL  may be an effective concentration for many patients. However, some  are best treated at concentrations outside of this range. Acetaminophen concentrations >150 ug/mL at 4 hours after ingestion  and >50 ug/mL at 12 hours after ingestion are often associated with  toxic reactions.  Performed at [facility], [address] [place], [place] [address]   Ethanol     Status: None   Collection Time: 09/19/19  2:36 AM  Result Value Ref Range   Alcohol, Ethyl (B) <10 <10 mg/dL    Comment: (NOTE) Lowest detectable limit for serum alcohol is 10 mg/dL.  For medical purposes only. Performed at [facility], [address], [place], [place] [address]   Salicylate level     Status: Abnormal   Collection Time: 09/19/19  2:36 AM  Result Value Ref Range   Salicylate Lvl <6.7 (L) 7.0 - 30.0 mg/dL    Comment: Performed at [facility], [address] [place], [place] [address]  Brain natriuretic peptide     Status: None   Collection Time: 09/19/19  2:36 AM  Result Value Ref Range   B Natriuretic Peptide 75.4 0.0 - 100.0 pg/mL    Comment: Performed at [facility], [place], [place] [address]  Troponin I (High Sensitivity)     Status: None   Collection Time: 09/19/19  2:36 AM   Result Value Ref Range   Troponin I (High Sensitivity) 9 <18 ng/L    Comment: (NOTE) Elevated high sensitivity troponin I (hsTnI) values and significant  changes across serial measurements may suggest ACS but many other  chronic and acute conditions are known to elevate hsTnI results.  Refer to the "Links" section for chest pain algorithms and additional  guidance. Performed at [facility], [address] [place], [place] [address]   Lithium level     Status: None   Collection Time: 09/19/19  2:36 AM  Result Value Ref Range   Lithium Lvl 0.88 0.60 - 1.20 mmol/L    Comment: Performed at [facility], [address] [place], [place] [address]  Ammonia     Status: Abnormal   Collection Time: 09/19/19  2:38 AM  Result Value Ref Range   Ammonia <9 (L) 9 - 35 umol/L    Comment: Performed at [facility], [address] [place], [place] [address]  Urinalysis, Complete w Microscopic     Status: Abnormal   Collection Time: 09/19/19  2:46 AM  Result Value Ref Range   Color, Urine STRAW (A) YELLOW   APPearance CLEAR (A) CLEAR   Specific Gravity, Urine 1.008 1.005 -  1.030   pH 7.0 5.0 - 8.0   Glucose, UA 50 (A) NEGATIVE mg/dL   Hgb urine dipstick NEGATIVE NEGATIVE   Bilirubin Urine NEGATIVE NEGATIVE   Ketones, ur NEGATIVE NEGATIVE mg/dL   Protein, ur NEGATIVE NEGATIVE mg/dL   Nitrite NEGATIVE NEGATIVE   Leukocytes,Ua NEGATIVE NEGATIVE   WBC, UA 0-5 0 - 5 WBC/hpf   Bacteria, UA NONE SEEN NONE SEEN   Squamous Epithelial / LPF NONE SEEN 0 - 5   Mucus PRESENT     Comment: Performed at [facility], [address], [place], [place] [address]  Urine Drug Screen, Qualitative     Status: None   Collection Time: 09/19/19  2:46 AM  Result Value Ref Range   Tricyclic, Ur Screen NONE DETECTED NONE DETECTED   Amphetamines, Ur Screen NONE DETECTED NONE DETECTED   MDMA (Ecstasy)Ur Screen NONE DETECTED NONE DETECTED   Cocaine Metabolite,Ur [place] NONE  DETECTED NONE DETECTED   Opiate, Ur Screen NONE DETECTED NONE DETECTED   Phencyclidine (PCP) Ur S NONE DETECTED NONE DETECTED   Cannabinoid 50 Ng, Ur [place] NONE DETECTED NONE DETECTED   Barbiturates, Ur Screen NONE DETECTED NONE DETECTED   Benzodiazepine, Ur Scrn NONE DETECTED NONE DETECTED   Methadone Scn, Ur NONE DETECTED NONE DETECTED    Comment: (NOTE) Tricyclics + metabolites, urine    Cutoff 1000 ng/mL Amphetamines + metabolites, urine  Cutoff 1000 ng/mL MDMA (Ecstasy), urine              Cutoff 500 ng/mL Cocaine Metabolite, urine          Cutoff 300 ng/mL Opiate + metabolites, urine        Cutoff 300 ng/mL Phencyclidine (PCP), urine         Cutoff 25 ng/mL Cannabinoid, urine                 Cutoff 50 ng/mL Barbiturates + metabolites, urine  Cutoff 200 ng/mL Benzodiazepine, urine              Cutoff 200 ng/mL Methadone, urine                   Cutoff 300 ng/mL  The urine drug screen provides only a preliminary, unconfirmed analytical test result and should not be used for non-medical purposes. Clinical consideration and professional judgment should be applied to any positive drug screen result due to possible interfering substances. A more specific alternate chemical method must be used in order to obtain a confirmed analytical result. Gas chromatography / mass spectrometry (GC/MS) is the preferred confirm atory method. Performed at [facility], [place], [place] [address]   Troponin I (High Sensitivity)     Status: None   Collection Time: 09/19/19  4:47 AM  Result Value Ref Range   Troponin I (High Sensitivity) 7 <18 ng/L    Comment: (NOTE) Elevated high sensitivity troponin I (hsTnI) values and significant  changes across serial measurements may suggest ACS but many other  chronic and acute conditions are known to elevate hsTnI results.  Refer to the "Links" section for chest pain algorithms and additional  guidance. Performed at [facility], [address] [place], [place] [address]   TSH     Status: None   Collection Time: 09/19/19  5:05 AM  Result Value Ref Range   TSH 0.504 0.350 - 4.500 uIU/mL    Comment: Performed by a 3rd Generation assay with a functional sensitivity of <=0.01 uIU/mL.  Performed at [facility], [address] [place], [place] [address]   T4, free     Status: None   Collection Time: 09/19/19  5:05 AM  Result Value Ref Range   Free T4 0.93 0.61 - 1.12 ng/dL    Comment: (NOTE) Biotin ingestion may interfere with free T4 tests. If the results are inconsistent with the TSH level, previous test results, or the clinical presentation, then consider biotin interference. If needed, order repeat testing after stopping biotin. Performed at [facility], [address] [place], [place] [address]   Glucose, capillary     Status: Abnormal   Collection Time: 09/19/19  1:03 PM  Result Value Ref Range   Glucose-Capillary 170 (H) 70 - 99 mg/dL    Comment: Glucose reference range applies only to samples taken after fasting for at least 8 hours.  SARS Coronavirus 2 by RT PCR (hospital order, performed in [facility] hospital lab) Nasopharyngeal Nasopharyngeal Swab     Status: None   Collection Time: 09/19/19  1:17 PM   Specimen: Nasopharyngeal Swab  Result Value Ref Range   SARS Coronavirus 2 NEGATIVE NEGATIVE    Comment: (NOTE) SARS-CoV-2 target nucleic acids are NOT DETECTED.  The SARS-CoV-2 RNA is generally detectable in upper and lower respiratory specimens during the acute phase of infection. The lowest concentration of SARS-CoV-2 viral copies this assay can detect is 250 copies / mL. A negative result does not preclude SARS-CoV-2 infection and should not be used as the sole basis for treatment or other patient management decisions.  A negative result may occur with improper specimen collection / handling, submission of specimen other than nasopharyngeal swab,  presence of viral mutation(s) within the areas targeted by this assay, and inadequate number of viral copies (<250 copies / mL). A negative result must be combined with clinical observations, patient history, and epidemiological information.  Fact Sheet for Patients:   StrictlyIdeas.no  Fact Sheet for Healthcare Providers: BankingDealers.co.za  This test is not yet approved or  cleared by the [place] FDA and has been authorized for detection and/or diagnosis of SARS-CoV-2 by FDA under an Emergency Use Authorization (EUA).  This EUA will remain in effect (meaning this test can be used) for the duration of the COVID-19 declaration under Section 564(b)(1) of the Act, 21 U.S.C. section 360bbb-3(b)(1), unless the authorization is terminated or revoked sooner.  Performed at [facility], [address] [place], [place] [address]   Glucose, capillary     Status: Abnormal   Collection Time: 09/19/19  4:13 PM  Result Value Ref Range   Glucose-Capillary 182 (H) 70 - 99 mg/dL    Comment: Glucose reference range applies only to samples taken after fasting for at least 8 hours.  MRSA PCR Screening     Status: None   Collection Time: 09/19/19  4:18 PM   Specimen: Nasopharyngeal  Result Value Ref Range   MRSA by PCR NEGATIVE NEGATIVE    Comment:        The GeneXpert MRSA Assay (FDA approved for NASAL specimens only), is one component of a comprehensive MRSA colonization surveillance program. It is not intended to diagnose MRSA infection nor to guide or monitor treatment for MRSA infections. Performed at [facility], [address] [place], [place] [address]   Glucose, capillary     Status: Abnormal   Collection Time: 09/19/19  9:20 PM  Result Value Ref Range   Glucose-Capillary 161 (H) 70 - 99 mg/dL  Comment: Glucose reference range applies only to samples taken after fasting for at least 8 hours.  Basic  metabolic panel     Status: Abnormal   Collection Time: 09/20/19  5:02 AM  Result Value Ref Range   Sodium 142 135 - 145 mmol/L   Potassium 4.6 3.5 - 5.1 mmol/L   Chloride 104 98 - 111 mmol/L   CO2 27 22 - 32 mmol/L   Glucose, Bld 186 (H) 70 - 99 mg/dL    Comment: Glucose reference range applies only to samples taken after fasting for at least 8 hours.   BUN 18 6 - 20 mg/dL   Creatinine, Ser 0.99 0.44 - 1.00 mg/dL   Calcium 10.7 (H) 8.9 - 10.3 mg/dL   GFR calc non Af Amer >60 >60 mL/min   GFR calc Af Amer >60 >60 mL/min   Anion gap 11 5 - 15    Comment: Performed at [facility], [address] [place], [place] [address]  CBC     Status: Abnormal   Collection Time: 09/20/19  5:02 AM  Result Value Ref Range   WBC 23.9 (H) 4.0 - 10.5 K/uL   RBC 4.65 3.87 - 5.11 MIL/uL   Hemoglobin 12.3 12.0 - 15.0 g/dL   HCT 40.9 36 - 46 %   MCV 88.0 80.0 - 100.0 fL   MCH 26.5 26.0 - 34.0 pg   MCHC 30.1 30.0 - 36.0 g/dL   RDW 15.1 11.5 - 15.5 %   Platelets 388 150 - 400 K/uL   nRBC 0.0 0.0 - 0.2 %    Comment: Performed at [facility], [address] [place], [place] [address]  Glucose, capillary     Status: Abnormal   Collection Time: 09/20/19  7:38 AM  Result Value Ref Range   Glucose-Capillary 151 (H) 70 - 99 mg/dL    Comment: Glucose reference range applies only to samples taken after fasting for at least 8 hours.  Glucose, capillary     Status: Abnormal   Collection Time: 09/20/19 11:28 AM  Result Value Ref Range   Glucose-Capillary 292 (H) 70 - 99 mg/dL    Comment: Glucose reference range applies only to samples taken after fasting for at least 8 hours.    Current Facility-Administered Medications  Medication Dose Route Frequency Provider Last Rate Last Admin  . acetaminophen (TYLENOL) tablet 650 mg  650 mg Oral Q6H PRN Ivor Costa, MD       Or  . acetaminophen (TYLENOL) suppository 650 mg  650 mg Rectal Q6H PRN Ivor Costa, MD      . albuterol (PROVENTIL) (2.5  MG/3ML) 0.083% nebulizer solution 2.5 mg  2.5 mg Nebulization Q4H PRN Ivor Costa, MD      . enoxaparin (LOVENOX) injection 40 mg  40 mg Subcutaneous Q24H Ivor Costa, MD   40 mg at 09/19/19 2137  . hydrALAZINE (APRESOLINE) injection 5 mg  5 mg Intravenous Q2H PRN Ivor Costa, MD      . insulin aspart (novoLOG) injection 0-5 Units  0-5 Units Subcutaneous QHS Ivor Costa, MD      . insulin aspart (novoLOG) injection 0-9 Units  0-9 Units Subcutaneous TID WC Ivor Costa, MD   5 Units at 09/20/19 1212  . levothyroxine (SYNTHROID) tablet 75 mcg  75 mcg Oral QAC breakfast Modena Jansky, MD   75 mcg at 09/20/19 1213  . MEDLINE mouth rinse  15 mL Mouth Rinse BID Ivor Costa, MD   15 mL at  09/19/19 2137  . ondansetron (ZOFRAN) tablet 4 mg  4 mg Oral Q6H PRN Ivor Costa, MD       Or  . ondansetron (ZOFRAN) injection 4 mg  4 mg Intravenous Q6H PRN Ivor Costa, MD        Musculoskeletal: Strength & Muscle Tone: has trouble with ambulation says her legs feel weak \  Gait & Station:  Limited by walker  Patient leans:  N/a   Psychiatric Specialty Exam: Physical Exam  Review of Systems  Blood pressure 117/88, pulse (!) 114, temperature 98.7 F (37.1 C), temperature source Oral, resp. rate 18, height 5\' 8"  (1.727 m), weight 99.1 kg, SpO2 92 %.Body mass index is 33.21 kg/m.      Mental Status   Appearance: older than stated age, haggard, whizened forlorn Oriented to person place and part of date Consciousness not clouded or fluctuant--she is at present alert  Concentration and attention somewhat impaired Rapport --fair  Thought process -illogical, strange, at times does not make sense Thought content --currently no frank delusions, illusions, hallucinations  Not severely paranoid or fearful  Mood --no new change Affect no new change Judgement insight reliability poor  Memory --not cooperative enough SI and HI --none contracts for safety  Rapport fair to poor eye contact fair Abstraction --cannot  cooperate Fund of knowledge and intelligence below average  Movements ---gross movement problems not observed                                                     Treatment Plan Summary:  At this time her collateral info is sketchy --group home and Mom are not that helpful But Group home RN said  Periodically she is hard to arouse.  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  Also She is due for Prolixin D which we can give here   Otherwise metabolic issues to be dealt with by Medicine before departure back to group home. Other causes   She is due for Prolixin D now her nurse at group home says, we can give this here before departure   Will adjust Abilify as well   Lilthium level okay 0.88 at this time        Disposition:  Back to group home when stable  Eulas Post, MD 09/20/2019 2:45 PM

## 2019-09-20 NOTE — Clinical Social Work Note (Addendum)
Pt will return to group home. CSW spoke with Butch Penny pt's caregiver at group home. She states as long as we provide the taxi driver with pt's address she is safe to take a taxi home. RN notified. Taxi voucher provided.   MD doesn't think its safe for pt to transfer via cab. RN called pt's mother. Pt's mother will transport pt.  [place], [place]

## 2019-09-20 NOTE — Progress Notes (Signed)
PROGRESS NOTE   Elizabeth Gutierrez  TKZ:[id number]    DOB: 1960-12-05    DOA: 09/19/2019  PCP: Elizabeth Lank, MD   I have briefly reviewed patients previous medical records in [facility].  Chief Complaint  Patient presents with  . Altered Mental Status    Brief Narrative:   59 y.o. female, group home resident, with medical history significant of paranoid schizophrenia, sCHF with EF 25-30%, DM, hypothyroidism, GERD, hyperlipidemia, GERD, mild mental retardation, CAD, heart attack, CKD-3, who presents with altered mental status.  Extensive work-up in the hospital not revealing for cause.  Strongly suspect that her presentation was related to psychiatric polypharmacy.  Psychiatry consulted and made small medication adjustments.  Was planning to DC back to group home but patient noted to have significant leukocytosis which is new, had low-grade fever of 100 F and chest x-ray suggestive of developing pneumonia.  Started empiric antibiotics, at risk for decline hence will monitor closely overnight prior to possible discharge in a.m.   Assessment & Plan:  Principal Problem:   Acute metabolic encephalopathy Active Problems:   Chronic systolic heart failure (HCC)   CAD in native artery   Benign essential HTN   Esophagitis, reflux   Hyperlipidemia   Paranoid schizophrenia (HCC)   Type II diabetes mellitus with renal manifestations (HCC)   Hypothyroidism   CKD (chronic kidney disease), stage IIIa   Acute respiratory failure with hypoxia (HCC)   Acute metabolic encephalopathy: Ammonia level less than 9, alcohol level less than 10, lithium level 0.88 which is therapeutic.  No signs of infection.  CT head negative.  MRI of the brain is limited study, but negative for acute intracranial issues. Patient is on multiple sedative medications, including Abilify, Cogentin, fluphenazine, Neurontin, lithium, Zoloft, trazodone.  Strongly suspect that her presentation was related to psychiatric  polypharmacy.  Psychiatry consulted and made small medication adjustments.  Abilify 30 mg was changed from daytime to at bedtime.  Mental status changes have resolved and she is back to baseline.  Monitor overnight and will need close outpatient follow-up with psychiatry which has been discussed with patient's mother and will convey to her group home.  Chronic systolic heart failure ([place]): 2D echo on 08/19/2016 showed EF of 25-30%.  Patient had 1+ leg edema and oxygen desaturation, indicating possible CHF exacerbation. BNP 75.4 which is likely falsely low due to obesity. CXR showed mild diffuse bronchovascular crowding. - will 20 mg of lasix now -Appears compensated apart from trace ankle edema.  Continue prior home medications  Acute respiratory failure with hypoxia: pt is not using oxygen at home per her mother.  No signs of infection for pneumonia.  Possibly due to CHF. -Continue nasal cannula oxygen to maintain oxygen saturation above 93% -As needed albuterol nebulizers  -Hypoxia has resolved and she is saturating in the 90s on room air.  CAD in native artery: -No anginal symptoms.  Benign essential HTN -Mildly uncontrolled at times.  Continue carvedilol.  Esophagitis, reflux -PPI.  Hyperlipidemia -Statins.  Paranoid schizophrenia ([place]) -Communicated with psychiatry who were consulted.  Medication changes made as above.  Was due for Prolixin today which she will receive.  Type II diabetes mellitus with renal manifestations ([place]): Most recent A1c 7.1, poorly controled. Patient is taking Metformin at home -SSI  Hypothyroidism: TSH 0.504, T4 0.93 which are all normal -continue oral Synthroid  CKD (chronic kidney disease), stage IIIa: Recent baseline creatinine 1.15 on 03/21/2019.  Her creatinine is 1.26, BUN 18.  Slightly worsening -  Creatinine normal at 0.99.  Marked leukocytosis/possible aspiration pneumonia: May have happened during period of AMS.  Oral Augmentin, check  procalcitonin.  Follow CBC in a.m.    Body mass index is 33.21 kg/m.  Obesity   DVT prophylaxis: Lovenox Code Status: Full Family Communication: Gust in detail with patient's mother, updated care and answered questions. Disposition:  Status is: Observation  The patient will require care spanning > 2 midnights and should be moved to inpatient because: Inpatient level of care appropriate due to severity of illness  Dispo: The patient is from: Home              Anticipated d/c is to: Home              Anticipated d/c date is: 1 day              Patient currently is not medically stable to d/c.        Consultants:   Psychiatric  Procedures:   None  Antimicrobials:   Augmentin   Subjective:  Patient evaluated along with RN this morning.  Alert and oriented x2.  Pleasant.  Reported some cough but no dyspnea.  Stated that nursing home staff give her her medications.  Objective:   Vitals:   09/20/19 1139 09/20/19 1552 09/20/19 1627 09/20/19 1703  BP: 117/88 127/85 (!) 137/108   Pulse: (!) 114 (!) 119 (!) 110   Resp: 18  18   Temp: 98.7 F (37.1 C) 98.9 F (37.2 C) 100 F (37.8 C) (!) 97.5 F (36.4 C)  TempSrc: Oral Oral Oral Axillary  SpO2: 92% 90% 95%   Weight:      Height:        General exam: [name], moderately built and obese sitting up comfortably in chair. Respiratory system: Occasional basal crackles but otherwise clear to auscultation. Respiratory effort normal. Cardiovascular system: S1 & S2 heard, RRR. No JVD, murmurs, rubs, gallops or clicks.  Trace ankle edema. Gastrointestinal system: Abdomen is nondistended, soft and nontender. No organomegaly or masses felt. Normal bowel sounds heard. Central nervous system: Alert and oriented. No focal neurological deficits. Extremities: Symmetric 5 x 5 power. Skin: No rashes, lesions or ulcers Psychiatry: Judgement and insight impaired/baseline. Mood & affect pleasant and  jovial.    Data Reviewed:   I have personally reviewed following labs and imaging studies   CBC: Recent Labs  Lab 09/19/19 0236 09/20/19 0502 09/20/19 1633  WBC 9.6 23.9* 23.3*  NEUTROABS 7.6  --   --   HGB 11.7* 12.3 11.6*  HCT 40.4 40.9 38.0  MCV 91.0 88.0 86.8  PLT 332 388 414*    Basic Metabolic Panel: Recent Labs  Lab 09/19/19 0236 09/20/19 0502  NA 137 142  K 5.0 4.6  CL 104 104  CO2 28 27  GLUCOSE 240* 186*  BUN 18 18  CREATININE 1.26* 0.99  CALCIUM 10.3 10.7*    Liver Function Tests: Recent Labs  Lab 09/19/19 0236  AST 12*  ALT 13  ALKPHOS 90  BILITOT 0.7  PROT 7.4  ALBUMIN 3.7    CBG: Recent Labs  Lab 09/20/19 0738 09/20/19 1128 09/20/19 1605  GLUCAP 151* 292* 281*    Microbiology Studies:   Recent Results (from the past 240 hour(s))  SARS Coronavirus 2 by RT PCR (hospital order, performed in [facility] hospital lab) Nasopharyngeal Nasopharyngeal Swab     Status: None   Collection Time: 09/19/19  1:17 PM   Specimen: Nasopharyngeal Swab  Result Value Ref Range Status   SARS Coronavirus 2 NEGATIVE NEGATIVE Final    Comment: (NOTE) SARS-CoV-2 target nucleic acids are NOT DETECTED.  The SARS-CoV-2 RNA is generally detectable in upper and lower respiratory specimens during the acute phase of infection. The lowest concentration of SARS-CoV-2 viral copies this assay can detect is 250 copies / mL. A negative result does not preclude SARS-CoV-2 infection and should not be used as the sole basis for treatment or other patient management decisions.  A negative result may occur with improper specimen collection / handling, submission of specimen other than nasopharyngeal swab, presence of viral mutation(s) within the areas targeted by this assay, and inadequate number of viral copies (<250 copies / mL). A negative result must be combined with clinical observations, patient history, and epidemiological information.  Fact Sheet for  Patients:   StrictlyIdeas.no  Fact Sheet for Healthcare Providers: BankingDealers.co.za  This test is not yet approved or  cleared by the [place] FDA and has been authorized for detection and/or diagnosis of SARS-CoV-2 by FDA under an Emergency Use Authorization (EUA).  This EUA will remain in effect (meaning this test can be used) for the duration of the COVID-19 declaration under Section 564(b)(1) of the Act, 21 U.S.C. section 360bbb-3(b)(1), unless the authorization is terminated or revoked sooner.  Performed at [facility], [address] [place], [place] [address]   MRSA PCR Screening     Status: None   Collection Time: 09/19/19  4:18 PM   Specimen: Nasopharyngeal  Result Value Ref Range Status   MRSA by PCR NEGATIVE NEGATIVE Final    Comment:        The GeneXpert MRSA Assay (FDA approved for NASAL specimens only), is one component of a comprehensive MRSA colonization surveillance program. It is not intended to diagnose MRSA infection nor to guide or monitor treatment for MRSA infections. Performed at [facility], [address] [place], [place] [address]      Radiology Studies:  CT HEAD WO CONTRAST  Result Date: 09/19/2019 CLINICAL DATA:  Altered level of consciousness EXAM: CT HEAD WITHOUT CONTRAST TECHNIQUE: Contiguous axial images were obtained from the base of the skull through the vertex without intravenous contrast. COMPARISON:  03/20/2019 FINDINGS: Brain: No acute infarct or hemorrhage. Stable minimal hypodensity in the periventricular white matter compatible with chronic small vessel ischemic change. Lateral ventricles and midline structures are unremarkable. No acute extra-axial fluid collections. No mass effect. Vascular: No hyperdense vessel or unexpected calcification. Skull: Normal. Negative for fracture or focal lesion. Sinuses/Orbits: No acute finding. Other: None. IMPRESSION: 1.  Stable head CT, no acute process. Electronically Signed   By: Randa Ngo M.D.   On: 09/19/2019 03:35   MR Brain Wo Contrast (neuro protocol)  Result Date: 09/19/2019 CLINICAL DATA:  Encephalopathy. EXAM: MRI HEAD WITHOUT CONTRAST TECHNIQUE: Multiplanar, multiecho pulse sequences of the brain and surrounding structures were obtained without intravenous contrast. COMPARISON:  Head CT 09/19/2019 and MRI 03/21/2019 FINDINGS: Multiple sequences are moderately motion degraded despite the use of faster, more motion resistant imaging protocols. Brain: There is no evidence of acute infarct, intracranial hemorrhage, mass, midline shift, or extra-axial fluid collection. The ventricles and sulci are within normal limits for age. T2 hyperintensities in the cerebral white matter bilaterally are similar to the prior MRI and are nonspecific but compatible with mild chronic small vessel ischemic disease. A small chronic right cerebellar infarct is again noted. Vascular: Major intracranial vascular flow voids are preserved. Skull and upper  cervical spine: Grossly unremarkable bone marrow signal. Sinuses/Orbits: Unremarkable orbits. Minimal left maxillary sinus mucosal thickening. Clear mastoid air cells. Other: None. IMPRESSION: 1. Motion degraded examination without evidence of acute intracranial abnormality. 2. Mild chronic small vessel ischemic disease. 3. Chronic right cerebellar infarct. Electronically Signed   By: Logan Bores M.D.   On: 09/19/2019 08:22   DG Chest Port 1 View  Result Date: 09/20/2019 CLINICAL DATA:  Leukocytosis EXAM: PORTABLE CHEST 1 VIEW COMPARISON:  09/19/2019, 03/20/2019 CT 08/17/2016 FINDINGS: Low lung volumes. Cardiomegaly with vascular congestion. Mild increasing basilar opacity. No pneumothorax. IMPRESSION: 1. Cardiomegaly with vascular congestion. 2. Mild increasing basilar opacity, atelectasis versus developing pneumonia. Electronically Signed   By: Donavan Foil M.D.   On: 09/20/2019 17:55    DG Chest Port 1 View  Result Date: 09/19/2019 CLINICAL DATA:  Initial evaluation for acute altered mental status. EXAM: PORTABLE CHEST 1 VIEW COMPARISON:  Prior radiograph from 03/20/2019. FINDINGS: Cardiomegaly, stable.  Mediastinal silhouette within normal limits. Lungs are hypoinflated. Secondary mild diffuse bronchovascular crowding. No focal infiltrates. No pulmonary edema or pleural effusion. No pneumothorax. No acute osseous finding. IMPRESSION: 1. Shallow lung inflation with secondary mild diffuse bronchovascular crowding. 2. No other active cardiopulmonary disease. 3. Stable cardiomegaly without overt pulmonary edema. Electronically Signed   By: Jeannine Boga M.D.   On: 09/19/2019 03:02     Scheduled Meds:   . amoxicillin-clavulanate  1 tablet Oral Q12H  . ARIPiprazole  30 mg Oral QHS  . [START ON 09/21/2019] aspirin EC  81 mg Oral Daily  . atorvastatin  20 mg Oral QHS  . benztropine  0.5 mg Oral BID  . carvedilol  3.125 mg Oral BID  . enoxaparin (LOVENOX) injection  40 mg Subcutaneous Q24H  . fluPHENAZine decanoate  32.5 mg Intramuscular Q14 Days  . [START ON 09/21/2019] fluticasone  2 spray Each Nare Daily  . gabapentin  200 mg Oral TID  . insulin aspart  0-5 Units Subcutaneous QHS  . insulin aspart  0-9 Units Subcutaneous TID WC  . [START ON 09/21/2019] levothyroxine  75 mcg Oral QAC breakfast  . lithium carbonate  300 mg Oral BID  . mouth rinse  15 mL Mouth Rinse BID  . [START ON 09/21/2019] metFORMIN  1,000 mg Oral Q breakfast  . [START ON 09/21/2019] naproxen  500 mg Oral BID WC  . nystatin cream  1 application Topical QID  . [START ON 09/21/2019] pantoprazole  40 mg Oral Daily  . [START ON 09/21/2019] polyethylene glycol  17 g Oral Daily  . senna  1 tablet Oral QHS  . [START ON 09/21/2019] sertraline  100 mg Oral q AM    Continuous Infusions:     LOS: 0 days     Vernell Leep, MD, [place], [facility]. [facility]    To contact the attending provider between 7A-7P  or the covering provider during after hours 7P-7A, please log into the web site [email or web address] and access using universal [place] password for that web site. If you do not have the password, please call the hospital operator.  09/20/2019, 6:06 PM

## 2019-09-20 NOTE — Plan of Care (Signed)
  Problem: Clinical Measurements: Goal: Diagnostic test results will improve Outcome: Progressing   Problem: Activity: Goal: Risk for activity intolerance will decrease Outcome: Progressing   Problem: Safety: Goal: Ability to remain free from injury will improve Outcome: Progressing   

## 2019-09-20 NOTE — NC FL2 (Signed)
[place] LEVEL OF CARE SCREENING TOOL     IDENTIFICATION  Patient Name: Elizabeth Gutierrez Birthdate: [birth year] Sex: female Admission Date (Current Location): 09/19/2019  [facility] and [place] Number:  Engineering geologist and Address:  [facility], [address] [place], [place] [address]      Provider Number: [id number]  Attending Physician Name and Address:  Modena Jansky, MD  Relative Name and Phone Number:       Current Level of Care: Hospital Recommended Level of Care:  (group home) Prior Approval Number:    Date Approved/Denied:   PASRR Number:    Discharge Plan: Other (Comment) (group home)    Current Diagnoses: Patient Active Problem List   Diagnosis Date Noted  . Hyperlipidemia   . Paranoid schizophrenia ([place])   . Type II diabetes mellitus with renal manifestations ([place])   . Hypothyroidism   . CKD (chronic kidney disease), stage IIIa   . Acute respiratory failure with hypoxia ([place])   . Acute metabolic encephalopathy 00/86/7619  . Altered mental state 08/17/2016  . DDD (degenerative disc disease), lumbar 07/09/2015  . Facet syndrome, lumbar 07/09/2015  . Lumbar radiculopathy 07/09/2015  . Sacroiliac joint dysfunction 07/09/2015  . Spinal stenosis, lumbar region, 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  . Diabetic neuropathy ([place]) 07/09/2015  . CAD in native artery 06/16/2015  . Controlled type 2 diabetes mellitus without complication ([place]) 50/93/2671  . Benign essential HTN 06/16/2015  . Obstructive apnea 01/05/2015  . Esophagitis, reflux 08/06/2014  . Edema leg 05/14/2014  . Chronic systolic heart failure ([place]) 05/14/2014  . TI (tricuspid incompetence) 05/14/2014  . Chest pain 12/13/2013  . Combined fat and carbohydrate induced hyperlipemia 10/09/2013  . Breath shortness 10/09/2013    Orientation RESPIRATION BLADDER Height & Weight     Self, Place, Time  Normal Continent Weight:  (low bed. nurse  notified.) Height:  5\' 8"  (172.7 cm)  BEHAVIORAL SYMPTOMS/MOOD NEUROLOGICAL BOWEL NUTRITION STATUS      Continent Diet (heart healthy)  AMBULATORY STATUS COMMUNICATION OF NEEDS Skin   Independent Verbally Normal                       Personal Care Assistance Level of Assistance  Bathing, Dressing, Feeding Bathing Assistance: Independent Feeding assistance: Independent Dressing Assistance: Independent     Functional Limitations Info  Sight, Speech, Hearing Sight Info: Adequate Hearing Info: Adequate Speech Info: Adequate    SPECIAL CARE FACTORS FREQUENCY                       Contractures Contractures Info: Not present    Additional Factors Info  Code Status, Allergies Code Status Info: Full Code Allergies Info: no known allergies           Current Medications (09/20/2019):  This is the current hospital active medication list Current Facility-Administered Medications  Medication Dose Route Frequency Provider Last Rate Last Admin  . acetaminophen (TYLENOL) tablet 650 mg  650 mg Oral Q4H PRN Eulas Post, MD      . albuterol (PROVENTIL) (2.5 MG/3ML) 0.083% nebulizer solution 2.5 mg  2.5 mg Nebulization Q4H PRN Ivor Costa, MD      . ARIPiprazole (ABILIFY) tablet 30 mg  30 mg Oral QHS Eulas Post, MD      . Derrill Memo ON 09/21/2019] aspirin EC tablet 81 mg  81 mg Oral Daily Eulas Post, MD      . atorvastatin (LIPITOR) tablet 20  mg  20 mg Oral QHS Eulas Post, MD      . benztropine (COGENTIN) tablet 0.5 mg  0.5 mg Oral BID Eulas Post, MD      . carvedilol (COREG) tablet 3.125 mg  3.125 mg Oral BID Eulas Post, MD      . dextromethorphan-guaiFENesin (ROBITUSSIN-DM) 10-100 MG/5ML liquid 5 mL  5 mL Oral Q6H PRN Eulas Post, MD      . docusate sodium (COLACE) capsule 100 mg  100 mg Oral BID PRN Eulas Post, MD      . enoxaparin (LOVENOX) injection 40 mg  40 mg Subcutaneous Q24H Ivor Costa, MD   40 mg at 09/19/19 2137  . fluPHENAZine  decanoate (PROLIXIN) injection 32.5 mg  32.5 mg Intramuscular Q14 Days Eulas Post, MD   32.5 mg at 09/20/19 1546  . [START ON 09/21/2019] fluticasone (FLONASE) 50 MCG/ACT nasal spray 2 spray  2 spray Each Nare Daily Eulas Post, MD      . gabapentin (NEURONTIN) capsule 200 mg  200 mg Oral TID Eulas Post, MD   200 mg at 09/20/19 1547  . hydrALAZINE (APRESOLINE) injection 5 mg  5 mg Intravenous Q2H PRN Ivor Costa, MD      . insulin aspart (novoLOG) injection 0-5 Units  0-5 Units Subcutaneous QHS Ivor Costa, MD      . insulin aspart (novoLOG) injection 0-9 Units  0-9 Units Subcutaneous TID WC Ivor Costa, MD   5 Units at 09/20/19 1212  . [START ON 09/21/2019] levothyroxine (SYNTHROID) tablet 75 mcg  75 mcg Oral QAC breakfast Eulas Post, MD      . lithium carbonate (LITHOBID) CR tablet 300 mg  300 mg Oral BID Eulas Post, MD      . MEDLINE mouth rinse  15 mL Mouth Rinse BID Ivor Costa, MD   15 mL at 09/19/19 2137  . [START ON 09/21/2019] metFORMIN (GLUCOPHAGE-XR) 24 hr tablet 1,000 mg  1,000 mg Oral Q breakfast Eulas Post, MD      . Derrill Memo ON 09/21/2019] naproxen (NAPROSYN) tablet 500 mg  500 mg Oral BID WC Eulas Post, MD      . nystatin cream (MYCOSTATIN) 1 application  1 application Topical QID Eulas Post, MD      . ondansetron [facility]) tablet 4 mg  4 mg Oral Q6H PRN Ivor Costa, MD       Or  . ondansetron [facility]) injection 4 mg  4 mg Intravenous Q6H PRN Ivor Costa, MD      . Derrill Memo ON 09/21/2019] pantoprazole (PROTONIX) EC tablet 40 mg  40 mg Oral Daily Eulas Post, MD      . Derrill Memo ON 09/21/2019] polyethylene glycol (MIRALAX / GLYCOLAX) packet 17 g  17 g Oral Daily Eulas Post, MD      . senna [facility]) tablet 8.6 mg  1 tablet Oral QHS Eulas Post, MD      . Derrill Memo ON 09/21/2019] sertraline (ZOLOFT) tablet 100 mg  100 mg Oral q AM Eulas Post, MD         Discharge Medications: Please see discharge summary for a list of discharge  medications.  Relevant Imaging Results:  Relevant Lab Results:   Additional Information SSN:[id number]  Gerrianne Scale Dolce Sylvia, LCSW

## 2019-09-20 NOTE — Discharge Instructions (Signed)

## 2019-09-21 DIAGNOSIS — D72829 Elevated white blood cell count, unspecified: Secondary | ICD-10-CM

## 2019-09-21 LAB — CBC
HCT: 39.2 % (ref 36.0–46.0)
Hemoglobin: 11.7 g/dL — ABNORMAL LOW (ref 12.0–15.0)
MCH: 26.4 pg (ref 26.0–34.0)
MCHC: 29.8 g/dL — ABNORMAL LOW (ref 30.0–36.0)
MCV: 88.3 fL (ref 80.0–100.0)
Platelets: 373 10*3/uL (ref 150–400)
RBC: 4.44 MIL/uL (ref 3.87–5.11)
RDW: 15.1 % (ref 11.5–15.5)
WBC: 18.5 10*3/uL — ABNORMAL HIGH (ref 4.0–10.5)
nRBC: 0 % (ref 0.0–0.2)

## 2019-09-21 LAB — PROCALCITONIN: Procalcitonin: 1.43 ng/mL

## 2019-09-21 LAB — GLUCOSE, CAPILLARY
Glucose-Capillary: 175 mg/dL — ABNORMAL HIGH (ref 70–99)
Glucose-Capillary: 267 mg/dL — ABNORMAL HIGH (ref 70–99)

## 2019-09-21 MED ORDER — AMOXICILLIN-POT CLAVULANATE 875-125 MG PO TABS: 1.0000 | ORAL_TABLET | Freq: Two times a day (BID) | ORAL | 0 refills | Status: AC

## 2019-09-21 MED ORDER — ARIPIPRAZOLE 30 MG PO TABS: 30.0000 mg | ORAL_TABLET | Freq: Every day | ORAL | 0 refills | Status: DC

## 2019-09-21 NOTE — Discharge Summary (Signed)
Physician Discharge Summary  Elizabeth Gutierrez WYO:[id number] DOB: 06/26/1960  PCP: Denton Lank, MD  Admitted from: Home Discharged to: Home  Admit date: 09/19/2019 Discharge date: 09/21/2019  Recommendations for Outpatient Follow-up:    Follow-up Information    Denton Lank, MD. Schedule an appointment as soon as possible for a visit in 1 week(s).   Specialty: Family Medicine Why: To be seen with repeat labs (CBC & BMP).  Kindly ensure that patient sees her psychiatrist early in the next 1 to 2 weeks.  Will need follow-up chest x-ray in 4 weeks to ensure resolution of pneumonia findings. Contact information: [address] [place] [address] [phone]        Psychiatrist. Schedule an appointment as soon as possible for a visit in 1 week(s).   Why: Evaluate for medication adjustment.               Home Health: None Equipment/Devices: None  Discharge Condition: Improved and stable CODE STATUS: Full Diet recommendation: Heart healthy & diabetic diet.  Discharge Diagnoses:  Principal Problem:   Acute metabolic encephalopathy Active Problems:   Chronic systolic heart failure (HCC)   CAD in native artery   Benign essential HTN   Esophagitis, reflux   Hyperlipidemia   Paranoid schizophrenia (HCC)   Type II diabetes mellitus with renal manifestations (HCC)   Hypothyroidism   CKD (chronic kidney disease), stage IIIa   Acute respiratory failure with hypoxia ([place])   Brief Summary: 59 y.o.female, group home resident,with medical history significant ofparanoid schizophrenia, chronicsCHF with EF 25-30%,DM 2, hypothyroidism, GERD, hyperlipidemia, GERD,mild mental retardation, CAD, heart attack, CKD-3, who presented with altered mental status.  Extensive work-up in the hospital not revealing for cause. Strongly suspect that her presentation was related to psychiatric polypharmacy.  Psychiatry consulted and made small medication adjustments with improvement  of her mental status and no recurrence of mental status changes.  Was planning to DC back to group home on 7/2 but patient noted to have significant leukocytosis which is new, had low-grade fever of 100 F and chest x-ray suggestive of developing pneumonia.    Initiated antibiotics for suspected aspiration pneumonia during episode of altered mental status.  Improved.   Assessment & Plan:   Acute metabolic encephalopathy:Ammonia level less than 9, alcohol level less than 10, lithium level 0.88 which is therapeutic.  Serum acetaminophen and salicylate level negative.  UA without features of UTI.  UDS negative.  TFTs normal.  CT head negative. MRI of the brain is limited study, but negative for acute intracranial issues. Patient is onmultiple sedative medications, including Abilify, Cogentin, fluphenazine, Neurontin, lithium, Zoloft, trazodone.  Strongly suspect that her presentation was related to psychiatric polypharmacy.  Psychiatry consulted and made small medication adjustments.  They indicated that patient's last outpatient psychiatric visit was on 5/21 and she is seen every 6 months.  They felt that the daytime Abilify was causing somnolence.  Thereby Abilify 30 mg was changed from daytime to at bedtime.  Mental status changes have resolved and she is back to baseline after the medication change.  She will need close outpatient follow-up with psychiatry which has been discussed with patient's mother and she will convey to the patient's group home.  Chronic systolic heart failure (OBS):9G echo on 08/19/2016 showed EF of 25-30%.Patient had 1+ leg edemaandoxygen desaturation, indicating possible CHF exacerbation.BNP 75.4 which is likely falsely low due to obesity. CXR showedmild diffuse bronchovascular crowding. Clinically compensated at this time.  Troponins negative.  Acute respiratory  failure with hypoxia:pt is not using oxygen at home per her mother.  Unclear etiology.  May have been  due to sedation/somnolence.  No features of overt heart failure which appears compensated.  RN rechecked today and patient is not hypoxic.  CAD in native artery: -No anginal symptoms.  Benign essential HTN -Mildly uncontrolled at times.  Continue carvedilol.  Esophagitis, reflux -PPI.  Hyperlipidemia -Statins.  Paranoid schizophrenia ([place]) -Communicated with psychiatry who were consulted.  Medication changes made as above.  Was due for Prolixin on 7/2 which she received in the hospital.  Type II diabetes mellitus with renal manifestations [facility]):Most recent A1c7.1.  Mildly uncontrolled but may be reasonable.  Continue prior Metformin and close outpatient follow-up with PCP to adjust medications as deemed necessary.  In the hospital treated with SSI and was mildly uncontrolled and fluctuating.  Hypothyroidism: TSH 0.504, T4 0.93 which are all normal -continue oral Synthroid  CKD (chronic kidney disease), stage IIIa:Recent baseline creatinine 1.15on 03/21/2019. Her creatinine is 1.26, BUN 18. Slightly worsening -Creatinine normalized to 0.99.  Periodically follow BMP as outpatient.  Marked leukocytosis/possible aspiration pneumonia: May have happened during period of AMS.  Oral Augmentin, check procalcitonin which was elevated (1.84 > 1.43).  Has thus far received 2 doses of Augmentin, WBC is improved to 18.5 and procalcitonin also trending down.  Clinically does not look septic or toxic or hypoxic.  Complete total 5 days of antibiotics and recommend repeating chest x-ray in 4 weeks to ensure resolution of pneumonia findings.  Body mass index is 33.21 kg/m.  Obesity  Anemia: Stable.  Outpatient follow-up.    Consultants:   Psychiatry  Procedures:   None   Discharge Instructions  Discharge Instructions    (HEART FAILURE PATIENTS) Call MD:  Anytime you have any of the following symptoms: 1) 3 pound weight gain in 24 hours or 5 pounds in 1 week 2) shortness  of breath, with or without a dry hacking cough 3) swelling in the hands, feet or stomach 4) if you have to sleep on extra pillows at night in order to breathe.   Complete by: As directed    Call MD for:   Complete by: As directed    Recurrent change in mental status, confusion or sleepiness.   Call MD for:  difficulty breathing, headache or visual disturbances   Complete by: As directed    Call MD for:  extreme fatigue   Complete by: As directed    Call MD for:  persistant dizziness or light-headedness   Complete by: As directed    Call MD for:  persistant nausea and vomiting   Complete by: As directed    Call MD for:  severe uncontrolled pain   Complete by: As directed    Call MD for:  temperature >100.4   Complete by: As directed    Diet - low sodium heart healthy   Complete by: As directed    Diet Carb Modified   Complete by: As directed    Increase activity slowly   Complete by: As directed        Medication List    STOP taking these medications   naproxen sodium 550 MG tablet Commonly known as: ANAPROX     TAKE these medications   acetaminophen 325 MG tablet Commonly known as: TYLENOL Take 650 mg by mouth every 4 (four) hours as needed for moderate pain, fever or headache.   amoxicillin-clavulanate 875-125 MG tablet Commonly known as: AUGMENTIN Take 1 tablet by  mouth 2 (two) times daily for 4 days.   ARIPiprazole 30 MG tablet Commonly known as: ABILIFY Take 1 tablet (30 mg total) by mouth at bedtime. What changed: when to take this   aspirin 81 MG tablet Take 81 mg by mouth daily.   atorvastatin 20 MG tablet Commonly known as: LIPITOR Take 20 mg by mouth at bedtime.   benztropine 0.5 MG tablet Commonly known as: COGENTIN Take 0.5 mg by mouth 2 (two) times daily.   carvedilol 3.125 MG tablet Commonly known as: COREG Take 3.125 mg by mouth 2 (two) times daily.   Diabetic Tussin DM 10-100 MG/5ML liquid Generic drug: dextromethorphan-guaiFENesin Take  by mouth every 4 (four) hours as needed for cough.   docusate sodium 100 MG capsule Commonly known as: COLACE Take 100 mg by mouth 2 (two) times daily as needed for mild constipation.   fluPHENAZine decanoate 25 MG/ML injection Commonly known as: PROLIXIN Inject 31.25 mg into the muscle every 14 (fourteen) days.   fluticasone 50 MCG/ACT nasal spray Commonly known as: FLONASE Place 2 sprays into both nostrils daily.   gabapentin 100 MG capsule Commonly known as: NEURONTIN Take 200 mg by mouth 3 (three) times daily.   levothyroxine 75 MCG tablet Commonly known as: SYNTHROID Take 75 mcg by mouth daily before breakfast.   lithium carbonate 300 MG CR tablet Commonly known as: LITHOBID Take 300 mg by mouth every 12 (twelve) hours.   metFORMIN 500 MG 24 hr tablet Commonly known as: GLUCOPHAGE-XR Take 1,000 mg by mouth daily with breakfast.   MiraLax 17 g packet Generic drug: polyethylene glycol Take 17 g by mouth daily.   naproxen 500 MG tablet Commonly known as: NAPROSYN Take 500 mg by mouth 2 (two) times daily with a meal.   nystatin cream Commonly known as: MYCOSTATIN Apply 1 application topically 4 (four) times daily.   omeprazole 20 MG capsule Commonly known as: PRILOSEC Take 20 mg by mouth 2 (two) times daily.   senna 8.6 MG tablet Commonly known as: SENOKOT Take 1 tablet by mouth at bedtime.   sertraline 100 MG tablet Commonly known as: ZOLOFT Take 100 mg by mouth in the morning.   traZODone 50 MG tablet Commonly known as: DESYREL Take 1 tablet (50 mg total) by mouth at bedtime.      No Known Allergies    Procedures/Studies: CT HEAD WO CONTRAST  Result Date: 09/19/2019 CLINICAL DATA:  Altered level of consciousness EXAM: CT HEAD WITHOUT CONTRAST TECHNIQUE: Contiguous axial images were obtained from the base of the skull through the vertex without intravenous contrast. COMPARISON:  03/20/2019 FINDINGS: Brain: No acute infarct or hemorrhage. Stable  minimal hypodensity in the periventricular white matter compatible with chronic small vessel ischemic change. Lateral ventricles and midline structures are unremarkable. No acute extra-axial fluid collections. No mass effect. Vascular: No hyperdense vessel or unexpected calcification. Skull: Normal. Negative for fracture or focal lesion. Sinuses/Orbits: No acute finding. Other: None. IMPRESSION: 1. Stable head CT, no acute process. Electronically Signed   By: Randa Ngo M.D.   On: 09/19/2019 03:35   MR Brain Wo Contrast (neuro protocol)  Result Date: 09/19/2019 CLINICAL DATA:  Encephalopathy. EXAM: MRI HEAD WITHOUT CONTRAST TECHNIQUE: Multiplanar, multiecho pulse sequences of the brain and surrounding structures were obtained without intravenous contrast. COMPARISON:  Head CT 09/19/2019 and MRI 03/21/2019 FINDINGS: Multiple sequences are moderately motion degraded despite the use of faster, more motion resistant imaging protocols. Brain: There is no evidence of acute infarct, intracranial hemorrhage,  mass, midline shift, or extra-axial fluid collection. The ventricles and sulci are within normal limits for age. T2 hyperintensities in the cerebral white matter bilaterally are similar to the prior MRI and are nonspecific but compatible with mild chronic small vessel ischemic disease. A small chronic right cerebellar infarct is again noted. Vascular: Major intracranial vascular flow voids are preserved. Skull and upper cervical spine: Grossly unremarkable bone marrow signal. Sinuses/Orbits: Unremarkable orbits. Minimal left maxillary sinus mucosal thickening. Clear mastoid air cells. Other: None. IMPRESSION: 1. Motion degraded examination without evidence of acute intracranial abnormality. 2. Mild chronic small vessel ischemic disease. 3. Chronic right cerebellar infarct. Electronically Signed   By: Logan Bores M.D.   On: 09/19/2019 08:22   DG Chest Port 1 View  Result Date: 09/20/2019 CLINICAL DATA:   Leukocytosis EXAM: PORTABLE CHEST 1 VIEW COMPARISON:  09/19/2019, 03/20/2019 CT 08/17/2016 FINDINGS: Low lung volumes. Cardiomegaly with vascular congestion. Mild increasing basilar opacity. No pneumothorax. IMPRESSION: 1. Cardiomegaly with vascular congestion. 2. Mild increasing basilar opacity, atelectasis versus developing pneumonia. Electronically Signed   By: Donavan Foil M.D.   On: 09/20/2019 17:55   DG Chest Port 1 View  Result Date: 09/19/2019 CLINICAL DATA:  Initial evaluation for acute altered mental status. EXAM: PORTABLE CHEST 1 VIEW COMPARISON:  Prior radiograph from 03/20/2019. FINDINGS: Cardiomegaly, stable.  Mediastinal silhouette within normal limits. Lungs are hypoinflated. Secondary mild diffuse bronchovascular crowding. No focal infiltrates. No pulmonary edema or pleural effusion. No pneumothorax. No acute osseous finding. IMPRESSION: 1. Shallow lung inflation with secondary mild diffuse bronchovascular crowding. 2. No other active cardiopulmonary disease. 3. Stable cardiomegaly without overt pulmonary edema. Electronically Signed   By: Jeannine Boga M.D.   On: 09/19/2019 03:02      Subjective: Patient interviewed and examined along with her female RN in the room.  Patient sitting up on the chair.  Asking to go back home because they are having a "party".  Reports some dry cough but denies dyspnea.  Denies any other complaints.  As per RN, no acute issues noted.  Discharge Exam:  Vitals:   09/20/19 1925 09/21/19 0428 09/21/19 0716 09/21/19 1142  BP: 121/70 120/76 109/62 101/69  Pulse: 95 98 94 97  Resp: 20 20 18 18   Temp: 98.4 F (36.9 C) 98 F (36.7 C) 98.7 F (37.1 C) 99.5 F (37.5 C)  TempSrc: Oral Oral Oral Oral  SpO2: 90% 98% 100% 91%  Weight:  98.4 kg    Height:        General exam: [name], moderately built and obese sitting up comfortably in chair. Respiratory system:  Occasional basal crackles but otherwise clear to auscultation  without wheezing or rhonchi.  No increased work of breathing.  Able to speak in full sentences. Cardiovascular system: S1 & S2 heard, RRR. No JVD, murmurs, rubs, gallops or clicks.  Trace ankle edema. Gastrointestinal system: Abdomen is nondistended, soft and nontender. No organomegaly or masses felt. Normal bowel sounds heard. Central nervous system: Alert and oriented x2. No focal neurological deficits. Extremities: Symmetric 5 x 5 power. Skin: No rashes, lesions or ulcers Psychiatry: Judgement and insight impaired/baseline. Mood & affect pleasant and jovial.    The results of significant diagnostics from this hospitalization (including imaging, microbiology, ancillary and laboratory) are listed below for reference.     Microbiology: Recent Results (from the past 240 hour(s))  SARS Coronavirus 2 by RT PCR (hospital order, performed in [facility] hospital lab) Nasopharyngeal Nasopharyngeal Swab     Status:  None   Collection Time: 09/19/19  1:17 PM   Specimen: Nasopharyngeal Swab  Result Value Ref Range Status   SARS Coronavirus 2 NEGATIVE NEGATIVE Final    Comment: (NOTE) SARS-CoV-2 target nucleic acids are NOT DETECTED.  The SARS-CoV-2 RNA is generally detectable in upper and lower respiratory specimens during the acute phase of infection. The lowest concentration of SARS-CoV-2 viral copies this assay can detect is 250 copies / mL. A negative result does not preclude SARS-CoV-2 infection and should not be used as the sole basis for treatment or other patient management decisions.  A negative result may occur with improper specimen collection / handling, submission of specimen other than nasopharyngeal swab, presence of viral mutation(s) within the areas targeted by this assay, and inadequate number of viral copies (<250 copies / mL). A negative result must be combined with clinical observations, patient history, and epidemiological information.  Fact Sheet for Patients:    StrictlyIdeas.no  Fact Sheet for Healthcare Providers: BankingDealers.co.za  This test is not yet approved or  cleared by the [place] FDA and has been authorized for detection and/or diagnosis of SARS-CoV-2 by FDA under an Emergency Use Authorization (EUA).  This EUA will remain in effect (meaning this test can be used) for the duration of the COVID-19 declaration under Section 564(b)(1) of the Act, 21 U.S.C. section 360bbb-3(b)(1), unless the authorization is terminated or revoked sooner.  Performed at [facility], [address] [place], [place] [address]   MRSA PCR Screening     Status: None   Collection Time: 09/19/19  4:18 PM   Specimen: Nasopharyngeal  Result Value Ref Range Status   MRSA by PCR NEGATIVE NEGATIVE Final    Comment:        The GeneXpert MRSA Assay (FDA approved for NASAL specimens only), is one component of a comprehensive MRSA colonization surveillance program. It is not intended to diagnose MRSA infection nor to guide or monitor treatment for MRSA infections. Performed at [facility], [address] [place], [place] [address]      Labs: CBC: Recent Labs  Lab 09/19/19 0236 09/20/19 0502 09/20/19 1633 09/21/19 0606  WBC 9.6 23.9* 23.3* 18.5*  NEUTROABS 7.6  --   --   --   HGB 11.7* 12.3 11.6* 11.7*  HCT 40.4 40.9 38.0 39.2  MCV 91.0 88.0 86.8 88.3  PLT 332 388 414* 932    Basic Metabolic Panel: Recent Labs  Lab 09/19/19 0236 09/20/19 0502  NA 137 142  K 5.0 4.6  CL 104 104  CO2 28 27  GLUCOSE 240* 186*  BUN 18 18  CREATININE 1.26* 0.99  CALCIUM 10.3 10.7*    Liver Function Tests: Recent Labs  Lab 09/19/19 0236  AST 12*  ALT 13  ALKPHOS 90  BILITOT 0.7  PROT 7.4  ALBUMIN 3.7    CBG: Recent Labs  Lab 09/20/19 1128 09/20/19 1605 09/20/19 2048 09/21/19 0718 09/21/19 1146  GLUCAP 292* 281* 197* 175* 267*     Thyroid function  studies Recent Labs    09/19/19 0505  TSH 0.504    Urinalysis    Component Value Date/Time   COLORURINE STRAW (A) 09/19/2019 0246   APPEARANCEUR CLEAR (A) 09/19/2019 0246   APPEARANCEUR Clear 04/23/2014 1009   LABSPEC 1.008 09/19/2019 0246   LABSPEC 1.003 04/23/2014 1009   PHURINE 7.0 09/19/2019 0246   GLUCOSEU 50 (A) 09/19/2019 0246   GLUCOSEU Negative 04/23/2014 1009   HGBUR NEGATIVE 09/19/2019 0246   BILIRUBINUR  NEGATIVE 09/19/2019 0246   BILIRUBINUR Negative 04/23/2014 1009   KETONESUR NEGATIVE 09/19/2019 0246   PROTEINUR NEGATIVE 09/19/2019 0246   NITRITE NEGATIVE 09/19/2019 0246   LEUKOCYTESUR NEGATIVE 09/19/2019 0246   LEUKOCYTESUR Negative 04/23/2014 1009    I discussed in detail with patient's mother via phone, updated care and answered all questions.  Time coordinating discharge: 40 minutes  SIGNED:  Vernell Leep, MD, [place], [facility]. [facility]  To contact the attending provider between 7A-7P or the covering provider during after hours 7P-7A, please log into the web site [email or web address] and access using universal [place] password for that web site. If you do not have the password, please call the hospital operator.

## 2019-09-21 NOTE — Progress Notes (Signed)
Patient O2 saturation is 97% on room air at rest. Patient ambulated out of room to hallway and back twice, approx 40 feet, on room air and O2 sats remained above 92% for the duration.

## 2019-09-21 NOTE — Progress Notes (Signed)
Report called to Patient's mother Blanch Media, and the [facility] where the patient lives. Both parties were informed of discharge instructions, and where to pick up presciptions. Pt left with her mother. Discharge packet with information and education given to pt's mother. IVs discontinued prior to discharge.

## 2019-09-26 ENCOUNTER — Inpatient Hospital Stay
Admission: EM | Admit: 2019-09-26 | Discharge: 2019-10-02 | DRG: 291 | Disposition: A | Discharge status: Nursing Home (Includes ICF) | Payer: Medicare Other | Attending: Internal Medicine | Admitting: Internal Medicine

## 2019-09-26 ENCOUNTER — Encounter: Payer: Self-pay | Admitting: Radiology

## 2019-09-26 ENCOUNTER — Emergency Department: Payer: Medicare Other

## 2019-09-26 ENCOUNTER — Other Ambulatory Visit: Payer: Self-pay

## 2019-09-26 ENCOUNTER — Emergency Department: Admit: 2019-09-26 | Payer: Medicare Other | Admitting: Cardiovascular Disease

## 2019-09-26 DIAGNOSIS — E669 Obesity, unspecified: Secondary | ICD-10-CM | POA: Diagnosis present

## 2019-09-26 DIAGNOSIS — Z79891 Long term (current) use of opiate analgesic: Secondary | ICD-10-CM

## 2019-09-26 DIAGNOSIS — Z7982 Long term (current) use of aspirin: Secondary | ICD-10-CM

## 2019-09-26 DIAGNOSIS — E119 Type 2 diabetes mellitus without complications: Secondary | ICD-10-CM

## 2019-09-26 DIAGNOSIS — I5022 Chronic systolic (congestive) heart failure: Secondary | ICD-10-CM | POA: Diagnosis not present

## 2019-09-26 DIAGNOSIS — E039 Hypothyroidism, unspecified: Secondary | ICD-10-CM | POA: Diagnosis present

## 2019-09-26 DIAGNOSIS — E1165 Type 2 diabetes mellitus with hyperglycemia: Secondary | ICD-10-CM | POA: Diagnosis present

## 2019-09-26 DIAGNOSIS — Z79890 Hormone replacement therapy (postmenopausal): Secondary | ICD-10-CM | POA: Diagnosis not present

## 2019-09-26 DIAGNOSIS — I5042 Chronic combined systolic (congestive) and diastolic (congestive) heart failure: Secondary | ICD-10-CM | POA: Diagnosis present

## 2019-09-26 DIAGNOSIS — R0602 Shortness of breath: Secondary | ICD-10-CM

## 2019-09-26 DIAGNOSIS — J9601 Acute respiratory failure with hypoxia: Secondary | ICD-10-CM

## 2019-09-26 DIAGNOSIS — Z20822 Contact with and (suspected) exposure to covid-19: Secondary | ICD-10-CM | POA: Diagnosis present

## 2019-09-26 DIAGNOSIS — Z79899 Other long term (current) drug therapy: Secondary | ICD-10-CM

## 2019-09-26 DIAGNOSIS — Z7984 Long term (current) use of oral hypoglycemic drugs: Secondary | ICD-10-CM

## 2019-09-26 DIAGNOSIS — N183 Chronic kidney disease, stage 3 unspecified: Secondary | ICD-10-CM | POA: Diagnosis present

## 2019-09-26 DIAGNOSIS — Z6833 Body mass index (BMI) 33.0-33.9, adult: Secondary | ICD-10-CM

## 2019-09-26 DIAGNOSIS — J9621 Acute and chronic respiratory failure with hypoxia: Secondary | ICD-10-CM

## 2019-09-26 DIAGNOSIS — I252 Old myocardial infarction: Secondary | ICD-10-CM

## 2019-09-26 DIAGNOSIS — I5023 Acute on chronic systolic (congestive) heart failure: Secondary | ICD-10-CM | POA: Diagnosis present

## 2019-09-26 DIAGNOSIS — E785 Hyperlipidemia, unspecified: Secondary | ICD-10-CM | POA: Diagnosis present

## 2019-09-26 DIAGNOSIS — J189 Pneumonia, unspecified organism: Secondary | ICD-10-CM | POA: Diagnosis present

## 2019-09-26 DIAGNOSIS — F200 Paranoid schizophrenia: Secondary | ICD-10-CM | POA: Diagnosis present

## 2019-09-26 DIAGNOSIS — N1831 Chronic kidney disease, stage 3a: Secondary | ICD-10-CM | POA: Diagnosis present

## 2019-09-26 DIAGNOSIS — E1122 Type 2 diabetes mellitus with diabetic chronic kidney disease: Secondary | ICD-10-CM | POA: Diagnosis present

## 2019-09-26 DIAGNOSIS — R0600 Dyspnea, unspecified: Secondary | ICD-10-CM | POA: Diagnosis not present

## 2019-09-26 DIAGNOSIS — I10 Essential (primary) hypertension: Secondary | ICD-10-CM | POA: Diagnosis not present

## 2019-09-26 DIAGNOSIS — I2510 Atherosclerotic heart disease of native coronary artery without angina pectoris: Secondary | ICD-10-CM | POA: Diagnosis present

## 2019-09-26 DIAGNOSIS — I130 Hypertensive heart and chronic kidney disease with heart failure and stage 1 through stage 4 chronic kidney disease, or unspecified chronic kidney disease: Secondary | ICD-10-CM | POA: Diagnosis present

## 2019-09-26 DIAGNOSIS — Z87891 Personal history of nicotine dependence: Secondary | ICD-10-CM | POA: Diagnosis not present

## 2019-09-26 DIAGNOSIS — F79 Unspecified intellectual disabilities: Secondary | ICD-10-CM | POA: Diagnosis present

## 2019-09-26 DIAGNOSIS — N179 Acute kidney failure, unspecified: Secondary | ICD-10-CM | POA: Diagnosis present

## 2019-09-26 DIAGNOSIS — R0902 Hypoxemia: Secondary | ICD-10-CM

## 2019-09-26 LAB — TROPONIN I (HIGH SENSITIVITY)
Troponin I (High Sensitivity): 34 ng/L — ABNORMAL HIGH (ref <18)
Troponin I (High Sensitivity): 40 ng/L — ABNORMAL HIGH (ref <18)
Troponin I (High Sensitivity): 34 ng/L — ABNORMAL HIGH (ref <18)
Troponin I (High Sensitivity): 32 ng/L — ABNORMAL HIGH (ref <18)

## 2019-09-26 LAB — BLOOD GAS, VENOUS
Acid-base deficit: 8.2 mmol/L — ABNORMAL HIGH (ref 0.0–2.0)
Bicarbonate: 17.9 mmol/L — ABNORMAL LOW (ref 20.0–28.0)
O2 Saturation: 83.7 %
Patient temperature: 37
pCO2, Ven: 38 mmHg — ABNORMAL LOW (ref 44.0–60.0)
pH, Ven: 7.28 (ref 7.250–7.430)
pO2, Ven: 55 mmHg — ABNORMAL HIGH (ref 32.0–45.0)

## 2019-09-26 LAB — BASIC METABOLIC PANEL
Anion gap: 6 (ref 5–15)
BUN: 24 mg/dL — ABNORMAL HIGH (ref 6–20)
CO2: 21 mmol/L — ABNORMAL LOW (ref 22–32)
Calcium: 8.4 mg/dL — ABNORMAL LOW (ref 8.9–10.3)
Chloride: 110 mmol/L (ref 98–111)
Creatinine, Ser: 1.2 mg/dL — ABNORMAL HIGH (ref 0.44–1.00)
GFR calc Af Amer: 57 mL/min — ABNORMAL LOW (ref >60)
GFR calc non Af Amer: 49 mL/min — ABNORMAL LOW (ref >60)
Glucose, Bld: 281 mg/dL — ABNORMAL HIGH (ref 70–99)
Potassium: 3.7 mmol/L (ref 3.5–5.1)
Sodium: 137 mmol/L (ref 135–145)

## 2019-09-26 LAB — PROCALCITONIN: Procalcitonin: 0.9 ng/mL

## 2019-09-26 LAB — CBC WITH DIFFERENTIAL/PLATELET
Abs Immature Granulocytes: 0.51 10*3/uL — ABNORMAL HIGH (ref 0.00–0.07)
Basophils Absolute: 0.1 10*3/uL (ref 0.0–0.1)
Basophils Relative: 0 %
Eosinophils Absolute: 0.3 10*3/uL (ref 0.0–0.5)
Eosinophils Relative: 2 %
HCT: 35.3 % — ABNORMAL LOW (ref 36.0–46.0)
Hemoglobin: 11.1 g/dL — ABNORMAL LOW (ref 12.0–15.0)
Immature Granulocytes: 3 %
Lymphocytes Relative: 4 %
Lymphs Abs: 0.7 10*3/uL (ref 0.7–4.0)
MCH: 26.7 pg (ref 26.0–34.0)
MCHC: 31.4 g/dL (ref 30.0–36.0)
MCV: 85.1 fL (ref 80.0–100.0)
Monocytes Absolute: 1 10*3/uL (ref 0.1–1.0)
Monocytes Relative: 6 %
Neutro Abs: 13.6 10*3/uL — ABNORMAL HIGH (ref 1.7–7.7)
Neutrophils Relative %: 85 %
Platelets: 351 10*3/uL (ref 150–400)
RBC: 4.15 MIL/uL (ref 3.87–5.11)
RDW: 15 % (ref 11.5–15.5)
WBC: 16.1 10*3/uL — ABNORMAL HIGH (ref 4.0–10.5)
nRBC: 0.3 % — ABNORMAL HIGH (ref 0.0–0.2)

## 2019-09-26 LAB — GLUCOSE, CAPILLARY: Glucose-Capillary: 267 mg/dL — ABNORMAL HIGH (ref 70–99)

## 2019-09-26 LAB — BRAIN NATRIURETIC PEPTIDE: B Natriuretic Peptide: 552.6 pg/mL — ABNORMAL HIGH (ref 0.0–100.0)

## 2019-09-26 LAB — SARS CORONAVIRUS 2 BY RT PCR (HOSPITAL ORDER, PERFORMED IN ~~LOC~~ HOSPITAL LAB): SARS Coronavirus 2: NEGATIVE

## 2019-09-26 SURGERY — CORONARY/GRAFT ACUTE MI REVASCULARIZATION
Anesthesia: Moderate Sedation

## 2019-09-26 MED ORDER — POLYETHYLENE GLYCOL 3350 17 G PO PACK
17.0000 g | PACK | Freq: Every day | ORAL | Status: DC
  Administered 2019-09-27 – 2019-10-01 (×4): 17 g via ORAL
  Filled 2019-09-26 (×6): qty 1

## 2019-09-26 MED ORDER — ATORVASTATIN CALCIUM 20 MG PO TABS
20.0000 mg | ORAL_TABLET | Freq: Every day | ORAL | Status: DC
  Administered 2019-09-27 – 2019-10-02 (×6): 20 mg via ORAL
  Filled 2019-09-26 (×6): qty 1

## 2019-09-26 MED ORDER — ASPIRIN EC 81 MG PO TBEC
81.0000 mg | DELAYED_RELEASE_TABLET | Freq: Every day | ORAL | Status: DC
  Administered 2019-09-27 – 2019-10-02 (×6): 81 mg via ORAL
  Filled 2019-09-26 (×6): qty 1

## 2019-09-26 MED ORDER — FUROSEMIDE 10 MG/ML IJ SOLN
40.0000 mg | Freq: Once | INTRAMUSCULAR | Status: AC
  Administered 2019-09-26: 40 mg via INTRAVENOUS
  Filled 2019-09-26: qty 4

## 2019-09-26 MED ORDER — LEVOFLOXACIN IN D5W 750 MG/150ML IV SOLN
750.0000 mg | Freq: Once | INTRAVENOUS | Status: AC
  Administered 2019-09-26: 750 mg via INTRAVENOUS
  Filled 2019-09-26: qty 150

## 2019-09-26 MED ORDER — CARVEDILOL 6.25 MG PO TABS
3.1250 mg | ORAL_TABLET | Freq: Two times a day (BID) | ORAL | Status: DC
  Administered 2019-09-26 – 2019-10-02 (×9): 3.125 mg via ORAL
  Filled 2019-09-26 (×12): qty 1

## 2019-09-26 MED ORDER — PIPERACILLIN-TAZOBACTAM 3.375 G IVPB
3.3750 g | Freq: Three times a day (TID) | INTRAVENOUS | Status: DC
  Administered 2019-09-26 – 2019-10-02 (×18): 3.375 g via INTRAVENOUS
  Filled 2019-09-26 (×17): qty 50

## 2019-09-26 MED ORDER — LITHIUM CARBONATE ER 300 MG PO TBCR
300.0000 mg | EXTENDED_RELEASE_TABLET | Freq: Two times a day (BID) | ORAL | Status: DC
  Administered 2019-09-26 – 2019-10-02 (×13): 300 mg via ORAL
  Filled 2019-09-26 (×14): qty 1

## 2019-09-26 MED ORDER — INSULIN ASPART 100 UNIT/ML ~~LOC~~ SOLN
0.0000 [IU] | Freq: Every day | SUBCUTANEOUS | Status: DC
  Administered 2019-09-26: 3 [IU] via SUBCUTANEOUS
  Administered 2019-09-28 – 2019-09-30 (×3): 2 [IU] via SUBCUTANEOUS
  Filled 2019-09-26 (×4): qty 1

## 2019-09-26 MED ORDER — SENNA 8.6 MG PO TABS
1.0000 | ORAL_TABLET | Freq: Every day | ORAL | Status: DC
  Administered 2019-09-26 – 2019-10-02 (×7): 8.6 mg via ORAL
  Filled 2019-09-26 (×7): qty 1

## 2019-09-26 MED ORDER — PIPERACILLIN-TAZOBACTAM 3.375 G IVPB 30 MIN: 3.3750 g | Freq: Once | INTRAVENOUS | Status: DC

## 2019-09-26 MED ORDER — PANTOPRAZOLE SODIUM 40 MG PO TBEC
40.0000 mg | DELAYED_RELEASE_TABLET | Freq: Every day | ORAL | Status: DC
  Administered 2019-09-27 – 2019-10-02 (×6): 40 mg via ORAL
  Filled 2019-09-26 (×6): qty 1

## 2019-09-26 MED ORDER — DOCUSATE SODIUM 100 MG PO CAPS: 100.0000 mg | ORAL_CAPSULE | Freq: Two times a day (BID) | ORAL | Status: DC | PRN

## 2019-09-26 MED ORDER — GABAPENTIN 100 MG PO CAPS
200.0000 mg | ORAL_CAPSULE | Freq: Three times a day (TID) | ORAL | Status: DC
  Administered 2019-09-26 – 2019-10-02 (×19): 200 mg via ORAL
  Filled 2019-09-26 (×19): qty 2

## 2019-09-26 MED ORDER — SERTRALINE HCL 50 MG PO TABS
100.0000 mg | ORAL_TABLET | Freq: Every morning | ORAL | Status: DC
  Administered 2019-09-27 – 2019-10-02 (×6): 100 mg via ORAL
  Filled 2019-09-26 (×6): qty 2

## 2019-09-26 MED ORDER — BENZTROPINE MESYLATE 0.5 MG PO TABS
0.5000 mg | ORAL_TABLET | Freq: Two times a day (BID) | ORAL | Status: DC
  Administered 2019-09-26 – 2019-10-02 (×13): 0.5 mg via ORAL
  Filled 2019-09-26 (×14): qty 1

## 2019-09-26 MED ORDER — INSULIN ASPART 100 UNIT/ML ~~LOC~~ SOLN
0.0000 [IU] | Freq: Three times a day (TID) | SUBCUTANEOUS | Status: DC
  Administered 2019-09-27 (×2): 1 [IU] via SUBCUTANEOUS
  Administered 2019-09-27: 5 [IU] via SUBCUTANEOUS
  Administered 2019-09-28: 2 [IU] via SUBCUTANEOUS
  Administered 2019-09-28: 5 [IU] via SUBCUTANEOUS
  Administered 2019-09-28: 3 [IU] via SUBCUTANEOUS
  Administered 2019-09-29: 1 [IU] via SUBCUTANEOUS
  Administered 2019-09-29 (×2): 3 [IU] via SUBCUTANEOUS
  Administered 2019-09-30: 7 [IU] via SUBCUTANEOUS
  Administered 2019-09-30: 5 [IU] via SUBCUTANEOUS
  Administered 2019-10-01: 2 [IU] via SUBCUTANEOUS
  Administered 2019-10-01: 5 [IU] via SUBCUTANEOUS
  Administered 2019-10-01: 3 [IU] via SUBCUTANEOUS
  Administered 2019-10-02: 5 [IU] via SUBCUTANEOUS
  Administered 2019-10-02 (×2): 2 [IU] via SUBCUTANEOUS
  Filled 2019-09-26 (×17): qty 1

## 2019-09-26 MED ORDER — TRAZODONE HCL 50 MG PO TABS
50.0000 mg | ORAL_TABLET | Freq: Every day | ORAL | Status: DC
  Administered 2019-09-26 – 2019-10-01 (×6): 50 mg via ORAL
  Filled 2019-09-26 (×6): qty 1

## 2019-09-26 MED ORDER — ENOXAPARIN SODIUM 40 MG/0.4ML ~~LOC~~ SOLN
40.0000 mg | SUBCUTANEOUS | Status: DC
  Administered 2019-09-26 – 2019-10-02 (×7): 40 mg via SUBCUTANEOUS
  Filled 2019-09-26 (×7): qty 0.4

## 2019-09-26 MED ORDER — LEVOTHYROXINE SODIUM 50 MCG PO TABS
75.0000 ug | ORAL_TABLET | Freq: Every day | ORAL | Status: DC
  Administered 2019-09-27 – 2019-10-02 (×6): 75 ug via ORAL
  Filled 2019-09-26 (×6): qty 2

## 2019-09-26 MED ORDER — ARIPIPRAZOLE 15 MG PO TABS
30.0000 mg | ORAL_TABLET | Freq: Every day | ORAL | Status: DC
  Administered 2019-09-26 – 2019-10-02 (×7): 30 mg via ORAL
  Filled 2019-09-26 (×7): qty 2

## 2019-09-26 MED ORDER — METFORMIN HCL ER 500 MG PO TB24
1000.0000 mg | ORAL_TABLET | Freq: Every day | ORAL | Status: DC
  Administered 2019-09-27 – 2019-10-02 (×6): 1000 mg via ORAL
  Filled 2019-09-26 (×7): qty 2

## 2019-09-26 MED ORDER — IOHEXOL 350 MG/ML SOLN
75.0000 mL | Freq: Once | INTRAVENOUS | Status: AC | PRN
  Administered 2019-09-26: 75 mL via INTRAVENOUS

## 2019-09-26 NOTE — H&P (Signed)
History and Physical        Hospital Admission Note Date: 09/26/2019  Patient name: Elizabeth Gutierrez Medical record number: [id number] Date of birth: 03-Nov-1960 Age: 59 y.o. Gender: female  PCP: Denton Lank, MD    Patient coming from: Home via EMS   I have reviewed all records in the [facility].    Chief Complaint:  SOB   HPI: Elizabeth Gutierrez is a 59 y.o. female with PMH of CHF, CAD, DM,  HLD, HTN, schizophrenia, MR, hypothyroidism who presents with SOB. Was reportedly in 11s on RA. Presented to ED wearing NRB. Patient reports her breathing is fine. She denies chest pain. From chart review, seems patient may have a Rx for home O2. Patient denies thing. However, suspect poor historian due to MR.   Of note, patient recently discharged 7/3 after hospital stay for acute encephalopathy thought to be related to polypharmacy with her psych history. During hospital stay had notable leukocytosis to 24 and elevated PCT to 1.8. CXR potentially concerning for PNA. She was started on Augmentin and discharged home with Rx to complete 5 day course of antibiotics.     ED work-up/course:  Dyspnea, hypoxia, pulmonary edema and/or pneumonia   Plan: The patient had presented for dyspnea and hypoxia with lethargy. Patient's labs did reveal leukocytosis which has improved from 2 days ago.  Troponin was also elevated and BNP was elevated likely indicating CHF and demand.  EF is 25 to 30% from 2018 echocardiogram, I do not currently see any diuretics.  I have started her on IV Lasix and covered her with IV Levaquin.  I will discuss with the hospitalist for admission.  Review of Systems: Positives marked in 'bold' Unable to obtain complete ROS due to MR.   Past Medical History: Past Medical History:  Diagnosis Date  . CHF (congestive heart failure) ([place])   . Coronary artery disease   . Diabetes ([place])   . Heart attack ([place])   .  Hyperlipidemia   . Hypertension   . MR (mental retardation)    Mild  . Paranoid schizophrenia ([place])   . Thyroid disease     Past Surgical History:  Procedure Laterality Date  . FOOT SURGERY Right     Medications: Prior to Admission medications   Medication Sig Start Date End Date Taking? Authorizing Provider  acetaminophen (TYLENOL) 325 MG tablet Take 650 mg by mouth every 4 (four) hours as needed for moderate pain, fever or headache.     [provider]  ARIPiprazole (ABILIFY) 30 MG tablet Take 1 tablet (30 mg total) by mouth at bedtime. 09/21/19   Hongalgi, Lenis Dickinson, MD  aspirin 81 MG tablet Take 81 mg by mouth daily.    [provider]  atorvastatin (LIPITOR) 20 MG tablet Take 20 mg by mouth at bedtime.     [provider]  benztropine (COGENTIN) 0.5 MG tablet Take 0.5 mg by mouth 2 (two) times daily.    [provider]  carvedilol (COREG) 3.125 MG tablet Take 3.125 mg by mouth 2 (two) times daily. 12/26/18   [provider]  dextromethorphan-guaiFENesin (DIABETIC TUSSIN DM) 10-100 MG/5ML liquid Take by  mouth every 4 (four) hours as needed for cough.    [provider]  docusate sodium (COLACE) 100 MG capsule Take 100 mg by mouth 2 (two) times daily as needed for mild constipation.     [provider]  fluPHENAZine decanoate (PROLIXIN) 25 MG/ML injection Inject 31.25 mg into the muscle every 14 (fourteen) days.     [provider]  fluticasone (FLONASE) 50 MCG/ACT nasal spray Place 2 sprays into both nostrils daily.     [provider]  gabapentin (NEURONTIN) 100 MG capsule Take 200 mg by mouth 3 (three) times daily.    [provider]  levothyroxine (SYNTHROID, LEVOTHROID) 75 MCG tablet Take 75 mcg by mouth daily before breakfast.    [provider]  lithium carbonate (LITHOBID) 300 MG CR tablet Take 300 mg by mouth every 12 (twelve) hours.     [provider]  metFORMIN  (GLUCOPHAGE-XR) 500 MG 24 hr tablet Take 1,000 mg by mouth daily with breakfast.    [provider]  naproxen (NAPROSYN) 500 MG tablet Take 500 mg by mouth 2 (two) times daily with a meal.    [provider]  nystatin cream (MYCOSTATIN) Apply 1 application topically 4 (four) times daily.     [provider]  omeprazole (PRILOSEC) 20 MG capsule Take 20 mg by mouth 2 (two) times daily.  12/26/18   [provider]  polyethylene glycol (MIRALAX) 17 g packet Take 17 g by mouth daily.     [provider]  senna (SENOKOT) 8.6 MG tablet Take 1 tablet by mouth at bedtime.     [provider]  sertraline (ZOLOFT) 100 MG tablet Take 100 mg by mouth in the morning.     [provider]  traZODone (DESYREL) 50 MG tablet Take 1 tablet (50 mg total) by mouth at bedtime. 08/19/16   Loletha Grayer, MD    Allergies:  No Known Allergies  Social History:  reports that she quit smoking about 6 years ago. She has quit using smokeless tobacco. She reports that she does not drink alcohol and does not use drugs.  Family History: Family History  Problem Relation Age of Onset  . Alcohol abuse Father   . Breast cancer Neg Hx     Physical Exam: Blood pressure 123/79, pulse 91, temperature 98.3 F (36.8 C), temperature source Oral, resp. rate (!) 26, height 5\' 8"  (1.727 m), weight 98.6 kg, SpO2 91 %. General: Alert, awake, oriented x3, in no acute distress. Eyes: pink conjunctiva,anicteric sclera, pupils equal and reactive to light and accomodation, HEENT: normocephalic, atraumatic, oropharynx clear Neck: supple, no masses or lymphadenopathy, no goiter, no bruits, no JVD CVS: Regular rate and rhythm, without murmurs, rubs or gallops. No lower extremity edema Resp : Clear to auscultation bilaterally, no wheezing, rales or rhonchi. GI : Soft, nontender, nondistended, positive bowel sounds, no masses. No hepatomegaly. No hernia.  Musculoskeletal: No  clubbing or cyanosis, positive pedal pulses. No contracture. ROM intact  Neuro: Grossly intact, no focal neurological deficits, strength 5/5 upper and lower extremities bilaterally Psych: alert and oriented x 3, normal mood and affect Skin: no rashes or lesions, warm and dry   LABS on Admission: I have personally reviewed all the labs and imagings below    Basic Metabolic Panel: Recent Labs  Lab 09/20/19 0502 09/26/19 1557  NA 142 137  K 4.6 3.7  CL 104 110  CO2 27 21*  GLUCOSE 186* 281*  BUN 18 24*  CREATININE 0.99 1.20*  CALCIUM 10.7* 8.4*   Liver Function Tests: No results for input(s): AST, ALT, ALKPHOS, BILITOT, PROT, ALBUMIN in the last 168 hours. No results for input(s): LIPASE, AMYLASE in the last 168 hours. No results for input(s): AMMONIA in the last 168 hours. CBC: Recent Labs  Lab 09/21/19 0606 09/21/19 0606 09/26/19 1557  WBC 18.5*  --  16.1*  NEUTROABS  --   --  13.6*  HGB 11.7*  --  11.1*  HCT 39.2  --  35.3*  MCV 88.3   < > 85.1  PLT 373  --  351   < > = values in this interval not displayed.   Cardiac Enzymes: No results for input(s): CKTOTAL, CKMB, CKMBINDEX, TROPONINI in the last 168 hours. BNP: Invalid input(s): POCBNP CBG: Recent Labs  Lab 09/21/19 0718 09/21/19 1146  GLUCAP 175* 267*    Radiological Exams on Admission:  DG Chest Port 1 View  Result Date: 09/26/2019 CLINICAL DATA:  Pt here via [facility] from [facility] with SOB and lethergy. Pt sats in the 36s. Pt recently had pneumonia. Hx of HTN, diabetes, CAD, CHF. EXAM: PORTABLE CHEST 1 VIEW COMPARISON:  Chest radiograph 09/20/2019 FINDINGS: Stable cardiomediastinal contours with enlarged heart size. There are mild diffuse bilateral interstitial opacities and bibasilar right greater than left airspace opacities. No pneumothorax or large pleural effusion. No acute finding in the visualized skeleton. IMPRESSION: Diffuse bilateral mild interstitial opacities and bibasilar right greater than  left airspace opacities concerning for edema and/or infection. Electronically Signed   By: Audie Pinto M.D.   On: 09/26/2019 16:52      EKG: Independently reviewed.    Assessment/Plan Active Problems:   Chronic systolic heart failure (HCC)   CAD in native artery   Controlled type 2 diabetes mellitus without complication (HCC)   Benign essential HTN   Hyperlipidemia   Hypothyroidism   CKD (chronic kidney disease), stage IIIa   Acute respiratory failure with hypoxia (HCC)   Shortness of breath   SOB with Acute Hypoxic Respiratory Failure  History of systolic CHF with EF 10-25% on echo 2018. Thought to be compensated and therefore, not on any diuretics. Patient is now maintaining appropriate O2 saturations on 4 L [place]. Appears per chart review that patient has had home O2 prescribed in the past. BNP 552.6 (was recently 75). Troponin elevated to 34, suspect demand ischemia. At recent hospital stay, patient with leukocytosis to 23.9 and PCT trended 1.84 >1.43. She was started on Augmentin for PNA. CXR at that time showed mild increasing basilar opacity, read as atelectasis vs. Developing PNA. WBC today is still elevated but improved to 16.1. CXR today shows diffuse bilateral mild interstitial opacities and bibasilar R >L airspace opacities concerning for edema and/or infection. CTA negative for PE but positive for bilateral confluent densities most consistent with PNA, concerning for aspiration.  -admit to MedSurg, cardiac monitoring with continuous pulse ox  -received Zosyn and Levaquin in ED, narrow to Zosyn to cover for HCAP and anaerobic coverage with suspected aspiration -monitor leukocytosis  -repeat PCT  -received Levaquin in ED, hold additional diuresis for now but monitor volume status closely as respiratory status may be complicated by CHF  -repeat echo  -trend troponin  -strict I/Os and daily weights  -needs further clarification if patient is prescribed home O2   HTN    Normotensive.  -continue Coreg 3.125 mg   T2DM Last A1c 7.1% 03/21/19. Glucose 281 at admit.  -hold home Metformin  -start  SSI  -repeat A1c   CKD Stage III A  Cr 1.2, appears fairly consistent with baseline.  -monitor   HLD -continue ASA and Lipitor   Schizophrenia  -Continue Lithium, Sertraline, Cogentin, Abilify    Hypothyroidism TSH 0.504, T4 0.93  -continue Synthroid   DVT prophylaxis: Lovenox   CODE STATUS: FULL   Consults called: None   Admission status: Inpatient   The medical decision making on this patient was of high complexity and the patient is at high risk for clinical deterioration, therefore this is a level 3 admission.  Severity of Illness:     Moderate  The appropriate patient status for this patient is INPATIENT. Inpatient status is judged to be reasonable and necessary in order to provide the required intensity of service to ensure the patient's safety. The patient's presenting symptoms, physical exam findings, and initial radiographic and laboratory data in the context of their chronic comorbidities is felt to place them at high risk for further clinical deterioration. Furthermore, it is not anticipated that the patient will be medically stable for discharge from the hospital within 2 midnights of admission. The following factors support the patient status of inpatient.   " The patient's presenting symptoms include SOB. " The worrisome physical exam findings include hypoxia, mild tachypnea. " The initial radiographic and laboratory data are worrisome because of CXR concerning for edema vs. PNA, WBC 16, CTA concerning for aspiration PNA. " The chronic co-morbidities include CHF, CAD, DM,  HLD, HTN, schizophrenia, MR, hypothyroidism .   * I certify that at the point of admission it is my clinical judgment that the patient will require inpatient hospital care spanning beyond 2 midnights from the point of admission due to high intensity of service, high  risk for further deterioration and high frequency of surveillance required.*    Time Spent on Admission: 48 minutes      Melina Schools D.O.  [facility] 09/26/2019, 6:18 PM

## 2019-09-26 NOTE — Consult Note (Signed)
Pharmacy Antibiotic Note  Elizabeth Gutierrez is a 59 y.o. female admitted on 09/26/2019 with pneumonia.  Pharmacy has been consulted for Pip/tazo dosing.  Plan: Zosyn 3.375g IV q8h (4 hour infusion).  Height: 5\' 8"  (172.7 cm) Weight: 98.6 kg (217 lb 6.4 oz) IBW/kg (Calculated) : 63.9  Temp (24hrs), Avg:98.3 F (36.8 C), Min:98.3 F (36.8 C), Max:98.3 F (36.8 C)  Recent Labs  Lab 09/20/19 0502 09/20/19 1633 09/21/19 0606 09/26/19 1557  WBC 23.9* 23.3* 18.5* 16.1*  CREATININE 0.99  --   --  1.20*    Estimated Creatinine Clearance: 62 mL/min (A) (by C-G formula based on SCr of 1.2 mg/dL (H)).    No Known Allergies  Antimicrobials this admission: 7/8 pip/tazo >>   Dose adjustments this admission: None  Microbiology results: None  Thank you for allowing pharmacy to be a part of this patient's care.  Oswald Hillock, PharmD, BCPS 09/26/2019 7:22 PM

## 2019-09-26 NOTE — ED Notes (Signed)
Pt from [facility] group home

## 2019-09-26 NOTE — ED Triage Notes (Signed)
Pt here via [facility] from [facility] with SOB and lethergy. Pt sats in the 63s. Pt recently had pneumonia.

## 2019-09-26 NOTE — Progress Notes (Addendum)
Ch arrived at ED in response to Code STEMI PG. Ch was informed that Code was being cancelled.

## 2019-09-26 NOTE — ED Provider Notes (Signed)
ER Provider Note       Time seen: 3:56 PM   Level V caveat: History/ROS limited by altered mental status I have reviewed the vital signs and the nursing notes.  HISTORY   Chief Complaint Shortness of Breath    HPI Elizabeth Gutierrez is a 59 y.o. female with a history of CHF, coronary disease, diabetes, MI, hyperlipidemia, hypertension, schizophrenia, MR, thyroid disease who presents today for shortness of breath and lethargy.  Patient reportedly had oxygen saturations on room air in the 70s which is unusual for her.  Recently reportedly had pneumonia.  She denies any complaints on arrival.  Had arrived with a nonrebreather in place.  Past Medical History:  Diagnosis Date  . CHF (congestive heart failure) ([place])   . Coronary artery disease   . Diabetes ([place])   . Heart attack ([place])   . Hyperlipidemia   . Hypertension   . MR (mental retardation)    Mild  . Paranoid schizophrenia ([place])   . Thyroid disease     Past Surgical History:  Procedure Laterality Date  . FOOT SURGERY Right     Allergies Patient has no known allergies.  Review of Systems Constitutional: Negative for fever. Cardiovascular: Negative for chest pain. Respiratory: Positive for shortness of breath Gastrointestinal: Negative for abdominal pain, vomiting and diarrhea. Musculoskeletal: Negative for back pain. Skin: Negative for rash. Neurological: Negative for headaches  All systems negative/normal/unremarkable except as stated in the HPI  ____________________________________________   PHYSICAL EXAM:  VITAL SIGNS: Vitals:   09/26/19 1600 09/26/19 1652  BP: 123/79   Pulse: 91   Resp: (!) 26   Temp:  98.3 F (36.8 C)  SpO2: 91%     Constitutional:  Well appearing and in no distress. Eyes: Conjunctivae are normal. Normal extraocular movements. ENT      Head: Normocephalic and atraumatic.      Nose: No congestion/rhinnorhea.      Mouth/Throat: Mucous membranes are moist.      Neck: No  stridor. Cardiovascular: Normal rate, regular rhythm. No murmurs, rubs, or gallops. Respiratory: Normal respiratory effort without tachypnea nor retractions.  Breath sounds mostly clear bilaterally Gastrointestinal: Soft and nontender. Normal bowel sounds Musculoskeletal: Nontender with normal range of motion in extremities.  Mild lower extremity edema Neurologic:  Normal speech and language. No gross focal neurologic deficits are appreciated.  Skin:  Skin is warm, dry and intact.  Questionable nonblanching rash on her ankles and lower legs bilaterally Psychiatric: Speech and behavior are normal.  ____________________________________________  EKG: Interpreted by me.  Normal sinus rhythm with rate of 87 bpm, right bundle branch block, LVH, nonspecific ST segment changes not changed from prior, long QT  ____________________________________________   LABS (pertinent positives/negatives)  Labs Reviewed  CBC WITH DIFFERENTIAL/PLATELET - Abnormal; Notable for the following components:      Result Value   WBC 16.1 (*)    Hemoglobin 11.1 (*)    HCT 35.3 (*)    nRBC 0.3 (*)    Neutro Abs 13.6 (*)    Abs Immature Granulocytes 0.51 (*)    All other components within normal limits  BASIC METABOLIC PANEL - Abnormal; Notable for the following components:   CO2 21 (*)    Glucose, Bld 281 (*)    BUN 24 (*)    Creatinine, Ser 1.20 (*)    Calcium 8.4 (*)    GFR calc non Af Amer 49 (*)    GFR calc Af Amer 57 (*)  All other components within normal limits  BRAIN NATRIURETIC PEPTIDE - Abnormal; Notable for the following components:   B Natriuretic Peptide 552.6 (*)    All other components within normal limits  BLOOD GAS, VENOUS - Abnormal; Notable for the following components:   pCO2, Ven 38 (*)    pO2, Ven 55.0 (*)    Bicarbonate 17.9 (*)    Acid-base deficit 8.2 (*)    All other components within normal limits  TROPONIN I (HIGH SENSITIVITY) - Abnormal; Notable for the following components:    Troponin I (High Sensitivity) 34 (*)    All other components within normal limits  SARS CORONAVIRUS 2 BY RT PCR (HOSPITAL ORDER, [facility])  TROPONIN I (HIGH SENSITIVITY)   CRITICAL CARE Performed by: Laurence Aly   Total critical care time: 30 minutes  Critical care time was exclusive of separately billable procedures and treating other patients.  Critical care was necessary to treat or prevent imminent or life-threatening deterioration.  Critical care was time spent personally by me on the following activities: development of treatment plan with patient and/or surrogate as well as nursing, discussions with consultants, evaluation of patient's response to treatment, examination of patient, obtaining history from patient or surrogate, ordering and performing treatments and interventions, ordering and review of laboratory studies, ordering and review of radiographic studies, pulse oximetry and re-evaluation of patient's condition.  RADIOLOGY  Images were viewed by me Chest x-ray IMPRESSION: Diffuse bilateral mild interstitial opacities and bibasilar right greater than left airspace opacities concerning for edema and/or infection.  DIFFERENTIAL DIAGNOSIS  CHF, COPD, pneumonia, COVID-19, hypercarbia, hypoxic respiratory failure  ASSESSMENT AND PLAN  Dyspnea, hypoxia, pulmonary edema and/or pneumonia   Plan: The patient had presented for dyspnea and hypoxia with lethargy. Patient's labs did reveal leukocytosis which has improved from 2 days ago.  Troponin was also elevated and BNP was elevated likely indicating CHF and demand.  EF is 25 to 30% from 2018 echocardiogram, I do not currently see any diuretics.  I have started her on IV Lasix and covered her with IV Levaquin.  I will discuss with the hospitalist for admission.  Lenise Arena MD    Note: This note was generated in part or whole with voice recognition software. Voice recognition  is usually quite accurate but there are transcription errors that can and very often do occur. I apologize for any typographical errors that were not detected and corrected.     Earleen Newport, MD 09/26/19 [phone]

## 2019-09-27 ENCOUNTER — Inpatient Hospital Stay (HOSPITAL_COMMUNITY)
Admit: 2019-09-27 | Discharge: 2019-09-27 | Disposition: A | Discharge status: Home / Self Care | Payer: Medicare Other | Attending: Internal Medicine | Admitting: Internal Medicine

## 2019-09-27 ENCOUNTER — Other Ambulatory Visit: Payer: Self-pay

## 2019-09-27 ENCOUNTER — Encounter: Payer: Self-pay | Admitting: Internal Medicine

## 2019-09-27 DIAGNOSIS — R0602 Shortness of breath: Secondary | ICD-10-CM

## 2019-09-27 LAB — PROCALCITONIN: Procalcitonin: 0.88 ng/mL

## 2019-09-27 LAB — BASIC METABOLIC PANEL
Anion gap: 8 (ref 5–15)
BUN: 24 mg/dL — ABNORMAL HIGH (ref 6–20)
CO2: 29 mmol/L (ref 22–32)
Calcium: 10 mg/dL (ref 8.9–10.3)
Chloride: 102 mmol/L (ref 98–111)
Creatinine, Ser: 1.36 mg/dL — ABNORMAL HIGH (ref 0.44–1.00)
GFR calc Af Amer: 49 mL/min — ABNORMAL LOW (ref >60)
GFR calc non Af Amer: 42 mL/min — ABNORMAL LOW (ref >60)
Glucose, Bld: 170 mg/dL — ABNORMAL HIGH (ref 70–99)
Potassium: 4 mmol/L (ref 3.5–5.1)
Sodium: 139 mmol/L (ref 135–145)

## 2019-09-27 LAB — CBC
HCT: 33.8 % — ABNORMAL LOW (ref 36.0–46.0)
Hemoglobin: 10.2 g/dL — ABNORMAL LOW (ref 12.0–15.0)
MCH: 26.3 pg (ref 26.0–34.0)
MCHC: 30.2 g/dL (ref 30.0–36.0)
MCV: 87.1 fL (ref 80.0–100.0)
Platelets: 340 10*3/uL (ref 150–400)
RBC: 3.88 MIL/uL (ref 3.87–5.11)
RDW: 14.7 % (ref 11.5–15.5)
WBC: 12.4 10*3/uL — ABNORMAL HIGH (ref 4.0–10.5)
nRBC: 0.2 % (ref 0.0–0.2)

## 2019-09-27 LAB — GLUCOSE, CAPILLARY
Glucose-Capillary: 150 mg/dL — ABNORMAL HIGH (ref 70–99)
Glucose-Capillary: 288 mg/dL — ABNORMAL HIGH (ref 70–99)
Glucose-Capillary: 144 mg/dL — ABNORMAL HIGH (ref 70–99)
Glucose-Capillary: 188 mg/dL — ABNORMAL HIGH (ref 70–99)

## 2019-09-27 LAB — HEMOGLOBIN A1C
Hgb A1c MFr Bld: 8.6 % — ABNORMAL HIGH (ref 4.8–5.6)
Mean Plasma Glucose: 200.12 mg/dL

## 2019-09-27 LAB — ECHOCARDIOGRAM COMPLETE
Height: 68 in
Weight: 3478.4 oz

## 2019-09-27 MED ORDER — LACTATED RINGERS IV SOLN: INTRAVENOUS | Status: AC

## 2019-09-27 MED ORDER — PERFLUTREN LIPID MICROSPHERE
1.0000 mL | INTRAVENOUS | Status: AC | PRN
  Administered 2019-09-27: 3 mL via INTRAVENOUS
  Filled 2019-09-27: qty 10

## 2019-09-27 MED ORDER — LACTATED RINGERS IV SOLN: INTRAVENOUS | Status: DC

## 2019-09-27 NOTE — Progress Notes (Signed)
Progress Note    Elizabeth Gutierrez  ZDG:[id number] DOB: [birth year]  DOA: 09/26/2019 PCP: Denton Lank, MD      Brief Narrative:    Medical records reviewed and are as summarized below:  Elizabeth Gutierrez is a 59 y.o. female       Assessment/Plan:   Active Problems:   Chronic systolic heart failure ([place])   CAD in native artery   Controlled type 2 diabetes mellitus without complication ([place])   Benign essential HTN   Hyperlipidemia   Hypothyroidism   CKD (chronic kidney disease), stage IIIa   Acute respiratory failure with hypoxia ([place])   Shortness of breath  Pneumonia: Continue empiric IV antibiotics.  Follow-up blood cultures.  Acute hypoxemic respiratory failure: Continue fluids as per minute oxygen via nasal cannula and taper off as able.  Chronic systolic and diastolic CHF: 2D echo showed EF estimated at 30 to 95%, grade 1 diastolic dysfunction  Type II DM with hyperglycemia: Continue Metformin and NovoLog.  AKI: Creatinine was 0.99 on 09/20/2019.  Treat with IV fluids and monitor BMP.  Schizophrenia, mental retardation: Continue psychotropics     Body mass index is 33.06 kg/m. (Obesity): This complicates overall care and prognosis.  Diet Order            Diet heart healthy/carb modified Room service appropriate? Yes; Fluid consistency: Thin  Diet effective now                       Medications:   . ARIPiprazole  30 mg Oral QHS  . aspirin EC  81 mg Oral Daily  . atorvastatin  20 mg Oral QHS  . benztropine  0.5 mg Oral BID  . carvedilol  3.125 mg Oral BID  . enoxaparin (LOVENOX) injection  40 mg Subcutaneous Q24H  . gabapentin  200 mg Oral TID  . insulin aspart  0-5 Units Subcutaneous QHS  . insulin aspart  0-9 Units Subcutaneous TID WC  . levothyroxine  75 mcg Oral Q0600  . lithium carbonate  300 mg Oral Q12H  . metFORMIN  1,000 mg Oral Q breakfast  . pantoprazole  40 mg Oral Daily  . polyethylene glycol  17 g Oral Daily  . senna  1 tablet  Oral QHS  . sertraline  100 mg Oral q AM  . traZODone  50 mg Oral QHS   Continuous Infusions: . piperacillin-tazobactam (ZOSYN)  IV 3.375 g (09/27/19 1430)     Anti-infectives (From admission, onward)   Start     Dose/Rate Route Frequency Ordered Stop   09/26/19 2200  piperacillin-tazobactam (ZOSYN) IVPB 3.375 g     Discontinue     3.375 g 12.5 mL/hr over 240 Minutes Intravenous Every 8 hours 09/26/19 1925     09/26/19 1900  piperacillin-tazobactam (ZOSYN) IVPB 3.375 g  Status:  Discontinued        3.375 g 100 mL/hr over 30 Minutes Intravenous  Once 09/26/19 1846 09/26/19 1924   09/26/19 1745  levofloxacin (LEVAQUIN) IVPB 750 mg        750 mg 100 mL/hr over 90 Minutes Intravenous  Once 09/26/19 1735 09/26/19 2005             Family Communication/Anticipated D/C date and plan/Code Status   DVT prophylaxis: enoxaparin (LOVENOX) injection 40 mg Start: 09/26/19 2200     Code Status: Full Code  Family Communication: Plan discussed with patient Disposition Plan:    Status is: Inpatient  Remains  inpatient appropriate because:IV treatments appropriate due to intensity of illness or inability to take PO and Inpatient level of care appropriate due to severity of illness   Dispo: The patient is from: Home              Anticipated d/c is to: Group home              Anticipated d/c date is: 2 days              Patient currently is not medically stable to d/c.           Subjective:   She does not report any complaints.  She is unable to provide much history because of mental retardation.  Objective:    Vitals:   09/27/19 0855 09/27/19 1043 09/27/19 1049 09/27/19 1208  BP: 107/66 (!) 101/56  108/70  Pulse: 79 82  74  Resp: 20   20  Temp: 98 F (36.7 C)   99.1 F (37.3 C)  TempSrc: Oral   Oral  SpO2: 94% 92% 98% 96%  Weight:      Height:       No data found.   Intake/Output Summary (Last 24 hours) at 09/27/2019 1701 Last data filed at 09/27/2019  0545 Gross per 24 hour  Intake 750 ml  Output 2700 ml  Net -1950 ml   Filed Weights   09/26/19 1554  Weight: 98.6 kg    Exam:  GEN: NAD SKIN: Warm and dry EYES: EOMI ENT: MMM CV: RRR PULM: CTA B ABD: soft, ND, NT, +BS CNS: AAO x 3, non focal EXT: No edema or tenderness   Data Reviewed:   I have personally reviewed following labs and imaging studies:  Labs: Labs show the following:   Basic Metabolic Panel: Recent Labs  Lab 09/26/19 1557 09/27/19 0555  NA 137 139  K 3.7 4.0  CL 110 102  CO2 21* 29  GLUCOSE 281* 170*  BUN 24* 24*  CREATININE 1.20* 1.36*  CALCIUM 8.4* 10.0   GFR Estimated Creatinine Clearance: 54.7 mL/min (A) (by C-G formula based on SCr of 1.36 mg/dL (H)). Liver Function Tests: No results for input(s): AST, ALT, ALKPHOS, BILITOT, PROT, ALBUMIN in the last 168 hours. No results for input(s): LIPASE, AMYLASE in the last 168 hours. No results for input(s): AMMONIA in the last 168 hours. Coagulation profile No results for input(s): INR, PROTIME in the last 168 hours.  CBC: Recent Labs  Lab 09/21/19 0606 09/26/19 1557 09/27/19 0555  WBC 18.5* 16.1* 12.4*  NEUTROABS  --  13.6*  --   HGB 11.7* 11.1* 10.2*  HCT 39.2 35.3* 33.8*  MCV 88.3 85.1 87.1  PLT 373 351 340   Cardiac Enzymes: No results for input(s): CKTOTAL, CKMB, CKMBINDEX, TROPONINI in the last 168 hours. BNP (last 3 results) No results for input(s): PROBNP in the last 8760 hours. CBG: Recent Labs  Lab 09/21/19 1146 09/26/19 2055 09/27/19 0752 09/27/19 1204 09/27/19 1638  GLUCAP 267* 267* 150* 288* 144*   D-Dimer: No results for input(s): DDIMER in the last 72 hours. Hgb A1c: Recent Labs    09/27/19 0555  HGBA1C 8.6*   Lipid Profile: No results for input(s): CHOL, HDL, LDLCALC, TRIG, CHOLHDL, LDLDIRECT in the last 72 hours. Thyroid function studies: No results for input(s): TSH, T4TOTAL, T3FREE, THYROIDAB in the last 72 hours.  Invalid input(s):  FREET3 Anemia work up: No results for input(s): VITAMINB12, FOLATE, FERRITIN, TIBC, IRON, RETICCTPCT in the last 72 hours.  Sepsis Labs: Recent Labs  Lab 09/21/19 0606 09/26/19 1557 09/26/19 2115 09/27/19 0555  PROCALCITON 1.43  --  0.90 0.88  WBC 18.5* 16.1*  --  12.4*    Microbiology Recent Results (from the past 240 hour(s))  SARS Coronavirus 2 by RT PCR (hospital order, performed in [facility] hospital lab) Nasopharyngeal Nasopharyngeal Swab     Status: None   Collection Time: 09/19/19  1:17 PM   Specimen: Nasopharyngeal Swab  Result Value Ref Range Status   SARS Coronavirus 2 NEGATIVE NEGATIVE Final    Comment: (NOTE) SARS-CoV-2 target nucleic acids are NOT DETECTED.  The SARS-CoV-2 RNA is generally detectable in upper and lower respiratory specimens during the acute phase of infection. The lowest concentration of SARS-CoV-2 viral copies this assay can detect is 250 copies / mL. A negative result does not preclude SARS-CoV-2 infection and should not be used as the sole basis for treatment or other patient management decisions.  A negative result may occur with improper specimen collection / handling, submission of specimen other than nasopharyngeal swab, presence of viral mutation(s) within the areas targeted by this assay, and inadequate number of viral copies (<250 copies / mL). A negative result must be combined with clinical observations, patient history, and epidemiological information.  Fact Sheet for Patients:   StrictlyIdeas.no  Fact Sheet for Healthcare Providers: BankingDealers.co.za  This test is not yet approved or  cleared by the [place] FDA and has been authorized for detection and/or diagnosis of SARS-CoV-2 by FDA under an Emergency Use Authorization (EUA).  This EUA will remain in effect (meaning this test can be used) for the duration of the COVID-19 declaration under Section 564(b)(1) of the Act,  21 U.S.C. section 360bbb-3(b)(1), unless the authorization is terminated or revoked sooner.  Performed at [facility], [address] [place], [place] [address]   MRSA PCR Screening     Status: None   Collection Time: 09/19/19  4:18 PM   Specimen: Nasopharyngeal  Result Value Ref Range Status   MRSA by PCR NEGATIVE NEGATIVE Final    Comment:        The GeneXpert MRSA Assay (FDA approved for NASAL specimens only), is one component of a comprehensive MRSA colonization surveillance program. It is not intended to diagnose MRSA infection nor to guide or monitor treatment for MRSA infections. Performed at [facility], [address] [place], [place] [address]   SARS Coronavirus 2 by RT PCR (hospital order, performed in [facility] hospital lab) Nasopharyngeal Nasopharyngeal Swab     Status: None   Collection Time: 09/26/19  4:17 PM   Specimen: Nasopharyngeal Swab  Result Value Ref Range Status   SARS Coronavirus 2 NEGATIVE NEGATIVE Final    Comment: (NOTE) SARS-CoV-2 target nucleic acids are NOT DETECTED.  The SARS-CoV-2 RNA is generally detectable in upper and lower respiratory specimens during the acute phase of infection. The lowest concentration of SARS-CoV-2 viral copies this assay can detect is 250 copies / mL. A negative result does not preclude SARS-CoV-2 infection and should not be used as the sole basis for treatment or other patient management decisions.  A negative result may occur with improper specimen collection / handling, submission of specimen other than nasopharyngeal swab, presence of viral mutation(s) within the areas targeted by this assay, and inadequate number of viral copies (<250 copies / mL). A negative result must be combined with clinical observations, patient history, and epidemiological information.  Fact Sheet for Patients:   StrictlyIdeas.no  Fact Sheet for Healthcare  Providers: BankingDealers.co.za  This test is not yet approved or  cleared by the [place] FDA and has been authorized for detection and/or diagnosis of SARS-CoV-2 by FDA under an Emergency Use Authorization (EUA).  This EUA will remain in effect (meaning this test can be used) for the duration of the COVID-19 declaration under Section 564(b)(1) of the Act, 21 U.S.C. section 360bbb-3(b)(1), unless the authorization is terminated or revoked sooner.  Performed at [facility], [address] [place], [place] [address]     Procedures and diagnostic studies:  CT Angio Chest PE W and/or Wo Contrast  Result Date: 09/26/2019 CLINICAL DATA:  59 year old female with shortness of breath. EXAM: CT ANGIOGRAPHY CHEST WITH CONTRAST TECHNIQUE: Multidetector CT imaging of the chest was performed using the standard protocol during bolus administration of intravenous contrast. Multiplanar CT image reconstructions and MIPs were obtained to evaluate the vascular anatomy. CONTRAST:  75mL OMNIPAQUE IOHEXOL 350 MG/ML SOLN COMPARISON:  Chest CT dated 08/17/2016. FINDINGS: Evaluation of this exam is limited due to respiratory motion artifact. Cardiovascular: There is mild cardiomegaly. No pericardial effusion. 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 The thoracic aorta is unremarkable. Mild dilatation of the main pulmonary trunk suggestive of a degree of pulmonary hypertension. Evaluation of the pulmonary arteries is very limited due to respiratory motion artifact and suboptimal opacification and timing of the contrast. No large or central pulmonary artery embolus identified. Mediastinum/Nodes: There is no hilar adenopathy. Multiple mildly enlarged mediastinal lymph nodes measure up to 14 mm in the upper mediastinum, new since the prior CT. Mildly rounded anterior mediastinal lymph node measures 7 mm in short axis in the prevascular space. There is a  small hiatal hernia. There is ingested content within the esophagus which may represent reflux or none clearance. Esophagram may provide better evaluation if clinically indicated. No mediastinal fluid collection. Lungs/Pleura: There are bilateral lower lobe predominant confluent densities, right greater left, most consistent with pneumonia and concerning for aspiration. Clinical correlation is recommended. There is no pleural effusion or pneumothorax. The central airways are patent. Upper Abdomen: Multiple stones within the gallbladder. A 1 cm right adrenal nodule, indeterminate. Musculoskeletal: Diffuse osseous sclerosis most consistent with renal osteodystrophy. No acute osseous pathology. Review of the MIP images confirms the above findings. IMPRESSION: 1. No CT evidence of central pulmonary artery embolus. 2. Bilateral lower lobe predominant confluent densities most consistent with pneumonia and concerning for aspiration. Clinical correlation is recommended. 3. Small hiatal hernia and retained ingested content within the esophagus. 4. 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 5. Cholelithiasis. 6. Aortic Atherosclerosis (ICD10-I70.0). Electronically Signed   By: Anner Crete M.D.   On: 09/26/2019 18:42   DG Chest Port 1 View  Result Date: 09/26/2019 CLINICAL DATA:  Pt here via [facility] from [facility] with SOB and lethergy. Pt sats in the 72s. Pt recently had pneumonia. Hx of HTN, diabetes, CAD, CHF. EXAM: PORTABLE CHEST 1 VIEW COMPARISON:  Chest radiograph 09/20/2019 FINDINGS: Stable cardiomediastinal contours with enlarged heart size. There are mild diffuse bilateral interstitial opacities and bibasilar right greater than left airspace opacities. No pneumothorax or large pleural effusion. No acute finding in the visualized skeleton. IMPRESSION: Diffuse bilateral mild interstitial opacities and bibasilar right greater than left airspace opacities concerning for  edema and/or infection. Electronically Signed   By: Audie Pinto M.D.   On: 09/26/2019 16:52   ECHOCARDIOGRAM COMPLETE  Result Date: 09/27/2019    ECHOCARDIOGRAM REPORT   Patient Name:   Elizabeth Gutierrez Date of Exam: 09/27/2019 Medical Rec #:  [id number]     Height:       68.0 in Accession #:    [id number]    Weight:       217.4 lb Date of Birth:  February 11, 1961     BSA:          2.118 m Patient Age:    56 years      BP:           109/68 mmHg Patient Gender: F             HR:           71 bpm. Exam Location:  [facility] Procedure: 2D Echo, Cardiac Doppler, Color Doppler and Intracardiac            Opacification Agent Indications:     R06.00 Dyspnea  History:         Patient has prior history of Echocardiogram examinations. CHF,                  CAD; Risk Factors:Hypertension and Dyslipidemia.  Sonographer:     Charmayne Sheer RDCS (AE) Referring Phys:  [id number] Nicolette Bang Diagnosing Phys: Ida Rogue MD  Sonographer Comments: Suboptimal parasternal window, suboptimal apical window and no subcostal window. Image acquisition challenging due to patient body habitus. IMPRESSIONS  1. Left ventricular ejection fraction, by estimation, is 30 to 35%. The left ventricle has moderately decreased function. The left ventricle demonstrates severe global hypokinesis, wall motion of basal regions best perserved, unable to exclude stress cardiomyopathy. Left ventricular diastolic parameters are consistent with Grade I diastolic dysfunction (impaired relaxation).  2. Right ventricular systolic function is normal. The right ventricular size is normal. FINDINGS  Left Ventricle: Left ventricular ejection fraction, by estimation, is 30 to 35%. The left ventricle has moderately decreased function. The left ventricle demonstrates global hypokinesis. Definity contrast agent was given IV to delineate the left ventricular endocardial borders. The left ventricular internal cavity size was normal in size. There is no left ventricular  hypertrophy. Left ventricular diastolic parameters are consistent with Grade I diastolic dysfunction (impaired relaxation). Right Ventricle: The right ventricular size is normal. No increase in right ventricular wall thickness. Right ventricular systolic function is normal. Left Atrium: Left atrial size was normal in size. Right Atrium: Right atrial size was normal in size. Pericardium: There is no evidence of pericardial effusion. Mitral Valve: The mitral valve is normal in structure. Normal mobility of the mitral valve leaflets. No evidence of mitral valve regurgitation. No evidence of mitral valve stenosis. MV peak gradient, 2.8 mmHg. The mean mitral valve gradient is 1.0 mmHg. Tricuspid Valve: The tricuspid valve is normal in structure. Tricuspid valve regurgitation is not demonstrated. No evidence of tricuspid stenosis. Aortic Valve: The aortic valve was not well visualized. Aortic valve regurgitation is not visualized. No aortic stenosis is present. Aortic valve mean gradient measures 3.0 mmHg. Aortic valve peak gradient measures 6.0 mmHg. Aortic valve area, by VTI measures 4.01 cm. Pulmonic Valve: The pulmonic valve was normal in structure. Pulmonic valve regurgitation is trivial. No evidence of pulmonic stenosis. Aorta: The aortic root is normal in size and structure. Venous: The inferior vena cava is normal in size with greater than 50% respiratory variability, suggesting right atrial pressure of 3 mmHg. IAS/Shunts: No atrial level shunt detected by color flow Doppler.  LEFT VENTRICLE PLAX 2D LVIDd:  5.55 cm  Diastology LVIDs:         4.01 cm  LV e' lateral:   4.46 cm/s LV PW:         1.18 cm  LV E/e' lateral: 11.2 LV IVS:        0.89 cm  LV e' medial:    8.70 cm/s LVOT diam:     2.40 cm  LV E/e' medial:  5.8 LV SV:         78 LV SV Index:   37 LVOT Area:     4.52 cm  LEFT ATRIUM             Index LA diam:        4.50 cm 2.13 cm/m LA Vol (A2C):   39.3 ml 18.56 ml/m LA Vol (A4C):   36.0 ml 17.00  ml/m LA Biplane Vol: 38.5 ml 18.18 ml/m  AORTIC VALVE                   PULMONIC VALVE AV Area (Vmax):    3.53 cm    PV Vmax:       1.05 m/s AV Area (Vmean):   3.38 cm    PV Vmean:      65.100 cm/s AV Area (VTI):     4.01 cm    PV VTI:        0.155 m AV Vmax:           122.00 cm/s PV Peak grad:  4.4 mmHg AV Vmean:          85.900 cm/s PV Mean grad:  2.0 mmHg AV VTI:            0.195 m AV Peak Grad:      6.0 mmHg AV Mean Grad:      3.0 mmHg LVOT Vmax:         95.30 cm/s LVOT Vmean:        64.200 cm/s LVOT VTI:          0.173 m LVOT/AV VTI ratio: 0.89  AORTA Ao Root diam: 3.50 cm MITRAL VALVE MV Area (PHT): 6.48 cm    SHUNTS MV Peak grad:  2.8 mmHg    Systemic VTI:  0.17 m MV Mean grad:  1.0 mmHg    Systemic Diam: 2.40 cm MV Vmax:       0.83 m/s MV Vmean:      50.1 cm/s MV Decel Time: 117 msec MV E velocity: 50.10 cm/s MV A velocity: 87.40 cm/s MV E/A ratio:  0.57 Ida Rogue MD Electronically signed by Ida Rogue MD Signature Date/Time: 09/27/2019/11:32:23 AM    Final                LOS: 1 day   Gawain Crombie  [facility]     09/27/2019, 5:01 PM

## 2019-09-27 NOTE — Consult Note (Signed)
Pharmacy Antibiotic Note  Elizabeth Gutierrez is a 59 y.o. female admitted on 09/26/2019 with pneumonia.  Pharmacy has been consulted for Pip/tazo dosing.  Plan: Continue Zosyn 3.375g IV q8h (4 hour infusion).  Height: 5\' 8"  (172.7 cm) Weight: 98.6 kg (217 lb 6.4 oz) IBW/kg (Calculated) : 63.9  Temp (24hrs), Avg:98.4 F (36.9 C), Min:98 F (36.7 C), Max:99.1 F (37.3 C)  Recent Labs  Lab 09/20/19 1633 09/21/19 0606 09/26/19 1557 09/27/19 0555  WBC 23.3* 18.5* 16.1* 12.4*  CREATININE  --   --  1.20* 1.36*    Estimated Creatinine Clearance: 54.7 mL/min (A) (by C-G formula based on SCr of 1.36 mg/dL (H)).    No Known Allergies  Antimicrobials this admission: 7/8 pip/tazo >>   Dose adjustments this admission: None  Microbiology results: None  Thank you for allowing pharmacy to be a part of this patient's care.  Lu Duffel, PharmD, BCPS Clinical Pharmacist 09/27/2019 12:11 PM

## 2019-09-27 NOTE — Progress Notes (Signed)
*  PRELIMINARY RESULTS* Echocardiogram 2D Echocardiogram has been performed.  Elizabeth Gutierrez 09/27/2019, 8:57 AM

## 2019-09-28 DIAGNOSIS — J189 Pneumonia, unspecified organism: Secondary | ICD-10-CM | POA: Diagnosis present

## 2019-09-28 LAB — CBC WITH DIFFERENTIAL/PLATELET
Abs Immature Granulocytes: 0.28 10*3/uL — ABNORMAL HIGH (ref 0.00–0.07)
Basophils Absolute: 0.1 10*3/uL (ref 0.0–0.1)
Basophils Relative: 0 %
Eosinophils Absolute: 0.3 10*3/uL (ref 0.0–0.5)
Eosinophils Relative: 2 %
HCT: 36 % (ref 36.0–46.0)
Hemoglobin: 10.6 g/dL — ABNORMAL LOW (ref 12.0–15.0)
Immature Granulocytes: 2 %
Lymphocytes Relative: 17 %
Lymphs Abs: 2.1 10*3/uL (ref 0.7–4.0)
MCH: 25.8 pg — ABNORMAL LOW (ref 26.0–34.0)
MCHC: 29.4 g/dL — ABNORMAL LOW (ref 30.0–36.0)
MCV: 87.6 fL (ref 80.0–100.0)
Monocytes Absolute: 0.7 10*3/uL (ref 0.1–1.0)
Monocytes Relative: 6 %
Neutro Abs: 8.7 10*3/uL — ABNORMAL HIGH (ref 1.7–7.7)
Neutrophils Relative %: 73 %
Platelets: 356 10*3/uL (ref 150–400)
RBC: 4.11 MIL/uL (ref 3.87–5.11)
RDW: 14.9 % (ref 11.5–15.5)
WBC: 12.1 10*3/uL — ABNORMAL HIGH (ref 4.0–10.5)
nRBC: 0.2 % (ref 0.0–0.2)

## 2019-09-28 LAB — BASIC METABOLIC PANEL
Anion gap: 8 (ref 5–15)
BUN: 19 mg/dL (ref 6–20)
CO2: 30 mmol/L (ref 22–32)
Calcium: 9.9 mg/dL (ref 8.9–10.3)
Chloride: 100 mmol/L (ref 98–111)
Creatinine, Ser: 1.33 mg/dL — ABNORMAL HIGH (ref 0.44–1.00)
GFR calc Af Amer: 51 mL/min — ABNORMAL LOW (ref >60)
GFR calc non Af Amer: 44 mL/min — ABNORMAL LOW (ref >60)
Glucose, Bld: 320 mg/dL — ABNORMAL HIGH (ref 70–99)
Potassium: 4.9 mmol/L (ref 3.5–5.1)
Sodium: 138 mmol/L (ref 135–145)

## 2019-09-28 LAB — GLUCOSE, CAPILLARY
Glucose-Capillary: 176 mg/dL — ABNORMAL HIGH (ref 70–99)
Glucose-Capillary: 263 mg/dL — ABNORMAL HIGH (ref 70–99)
Glucose-Capillary: 212 mg/dL — ABNORMAL HIGH (ref 70–99)
Glucose-Capillary: 205 mg/dL — ABNORMAL HIGH (ref 70–99)

## 2019-09-28 LAB — PROCALCITONIN: Procalcitonin: 0.67 ng/mL

## 2019-09-28 MED ORDER — SODIUM CHLORIDE 0.9 % IV SOLN
INTRAVENOUS | Status: DC | PRN
  Administered 2019-09-28: 250 mL via INTRAVENOUS

## 2019-09-28 NOTE — Progress Notes (Addendum)
Progress Note    Elizabeth Gutierrez  EUM:[id number] DOB: [birth year]  DOA: 09/26/2019 PCP: Denton Lank, MD      Brief Narrative:    Medical records reviewed and are as summarized below:  Elizabeth Gutierrez is a 59 y.o. female       Assessment/Plan:   Principal Problem:   Bilateral pneumonia Active Problems:   Chronic systolic heart failure ([place])   CAD in native artery   Controlled type 2 diabetes mellitus without complication ([place])   Benign essential HTN   Hyperlipidemia   Hypothyroidism   CKD (chronic kidney disease), stage IIIa   Acute respiratory failure with hypoxia ([place])   Shortness of breath  Pneumonia: Continue empiric IV antibiotics.  Acute hypoxemic respiratory failure: Improving.  She is 89% on room air.  Oxygen via nasal cannula as needed.    Chronic systolic and diastolic CHF: Compensated.  2D echo showed EF estimated at 30 to 54%, grade 1 diastolic dysfunction  Type II DM with hyperglycemia: Hemoglobin A1c was 8.6.  Continue Metformin and NovoLog.  AKI: Creatinine is slowly trending down.  Creatinine was 0.99 on 09/20/2019.  Continue IV fluids and monitor BMP.  Schizophrenia, mental retardation: Continue psychotropics     Body mass index is 32.01 kg/m. (Obesity): This complicates overall care and prognosis.  Diet Order            Diet heart healthy/carb modified Room service appropriate? Yes; Fluid consistency: Thin  Diet effective now                       Medications:   . ARIPiprazole  30 mg Oral QHS  . aspirin EC  81 mg Oral Daily  . atorvastatin  20 mg Oral QHS  . benztropine  0.5 mg Oral BID  . carvedilol  3.125 mg Oral BID  . enoxaparin (LOVENOX) injection  40 mg Subcutaneous Q24H  . gabapentin  200 mg Oral TID  . insulin aspart  0-5 Units Subcutaneous QHS  . insulin aspart  0-9 Units Subcutaneous TID WC  . levothyroxine  75 mcg Oral Q0600  . lithium carbonate  300 mg Oral Q12H  . metFORMIN  1,000 mg Oral Q breakfast  .  pantoprazole  40 mg Oral Daily  . polyethylene glycol  17 g Oral Daily  . senna  1 tablet Oral QHS  . sertraline  100 mg Oral q AM  . traZODone  50 mg Oral QHS   Continuous Infusions: . sodium chloride 250 mL (09/28/19 0507)  . lactated ringers 50 mL/hr at 09/28/19 1521  . piperacillin-tazobactam (ZOSYN)  IV 3.375 g (09/28/19 1434)     Anti-infectives (From admission, onward)   Start     Dose/Rate Route Frequency Ordered Stop   09/26/19 2200  piperacillin-tazobactam (ZOSYN) IVPB 3.375 g     Discontinue     3.375 g 12.5 mL/hr over 240 Minutes Intravenous Every 8 hours 09/26/19 1925     09/26/19 1900  piperacillin-tazobactam (ZOSYN) IVPB 3.375 g  Status:  Discontinued        3.375 g 100 mL/hr over 30 Minutes Intravenous  Once 09/26/19 1846 09/26/19 1924   09/26/19 1745  levofloxacin (LEVAQUIN) IVPB 750 mg        750 mg 100 mL/hr over 90 Minutes Intravenous  Once 09/26/19 1735 09/26/19 2005             Family Communication/Anticipated D/C date and plan/Code Status   DVT  prophylaxis: enoxaparin (LOVENOX) injection 40 mg Start: 09/26/19 2200     Code Status: Full Code  Family Communication: Plan discussed with patient Disposition Plan:    Status is: Inpatient  Remains inpatient appropriate because:IV treatments appropriate due to intensity of illness or inability to take PO and Inpatient level of care appropriate due to severity of illness   Dispo: The patient is from: Home              Anticipated d/c is to: Group home              Anticipated d/c date is: 2 days              Patient currently is not medically stable to d/c.           Subjective:   No new complaints.  No cough or shortness of breath.  Oxygen saturation is 89% on room air  Objective:    Vitals:   09/27/19 2300 09/28/19 0459 09/28/19 0631 09/28/19 1141  BP:   113/63 102/62  Pulse:   74 73  Resp:   16 16  Temp:   98.7 F (37.1 C) 99 F (37.2 C)  TempSrc:   Oral   SpO2: 97%  94%  (!) 89%  Weight:  95.5 kg    Height:       No data found.   Intake/Output Summary (Last 24 hours) at 09/28/2019 1638 Last data filed at 09/28/2019 0945 Gross per 24 hour  Intake 1088.98 ml  Output 500 ml  Net 588.98 ml   Filed Weights   09/26/19 1554 09/28/19 0459  Weight: 98.6 kg 95.5 kg    Exam:  GEN: NAD SKIN: Warm and dry EYES: No pallor or icterus ENT: MMM CV: RRR PULM: No wheezing or rales heard ABD: soft, ND, NT, +BS CNS: AAO x 3, she is not oriented to situation, non focal EXT: No edema or tenderness   Data Reviewed:   I have personally reviewed following labs and imaging studies:  Labs: Labs show the following:   Basic Metabolic Panel: Recent Labs  Lab 09/26/19 1557 09/26/19 1557 09/27/19 0555 09/28/19 0852  NA 137  --  139 138  K 3.7   < > 4.0 4.9  CL 110  --  102 100  CO2 21*  --  29 30  GLUCOSE 281*  --  170* 320*  BUN 24*  --  24* 19  CREATININE 1.20*  --  1.36* 1.33*  CALCIUM 8.4*  --  10.0 9.9   < > = values in this interval not displayed.   GFR Estimated Creatinine Clearance: 55 mL/min (A) (by C-G formula based on SCr of 1.33 mg/dL (H)). Liver Function Tests: No results for input(s): AST, ALT, ALKPHOS, BILITOT, PROT, ALBUMIN in the last 168 hours. No results for input(s): LIPASE, AMYLASE in the last 168 hours. No results for input(s): AMMONIA in the last 168 hours. Coagulation profile No results for input(s): INR, PROTIME in the last 168 hours.  CBC: Recent Labs  Lab 09/26/19 1557 09/27/19 0555 09/28/19 0852  WBC 16.1* 12.4* 12.1*  NEUTROABS 13.6*  --  8.7*  HGB 11.1* 10.2* 10.6*  HCT 35.3* 33.8* 36.0  MCV 85.1 87.1 87.6  PLT 351 340 356   Cardiac Enzymes: No results for input(s): CKTOTAL, CKMB, CKMBINDEX, TROPONINI in the last 168 hours. BNP (last 3 results) No results for input(s): PROBNP in the last 8760 hours. CBG: Recent Labs  Lab 09/27/19 1204 09/27/19  1638 09/27/19 2115 09/28/19 0759 09/28/19 1137  GLUCAP  288* 144* 188* 176* 263*   D-Dimer: No results for input(s): DDIMER in the last 72 hours. Hgb A1c: Recent Labs    09/27/19 0555  HGBA1C 8.6*   Lipid Profile: No results for input(s): CHOL, HDL, LDLCALC, TRIG, CHOLHDL, LDLDIRECT in the last 72 hours. Thyroid function studies: No results for input(s): TSH, T4TOTAL, T3FREE, THYROIDAB in the last 72 hours.  Invalid input(s): FREET3 Anemia work up: No results for input(s): VITAMINB12, FOLATE, FERRITIN, TIBC, IRON, RETICCTPCT in the last 72 hours. Sepsis Labs: Recent Labs  Lab 09/26/19 1557 09/26/19 2115 09/27/19 0555 09/28/19 0852  PROCALCITON  --  0.90 0.88 0.67  WBC 16.1*  --  12.4* 12.1*    Microbiology Recent Results (from the past 240 hour(s))  SARS Coronavirus 2 by RT PCR (hospital order, performed in [facility] hospital lab) Nasopharyngeal Nasopharyngeal Swab     Status: None   Collection Time: 09/19/19  1:17 PM   Specimen: Nasopharyngeal Swab  Result Value Ref Range Status   SARS Coronavirus 2 NEGATIVE NEGATIVE Final    Comment: (NOTE) SARS-CoV-2 target nucleic acids are NOT DETECTED.  The SARS-CoV-2 RNA is generally detectable in upper and lower respiratory specimens during the acute phase of infection. The lowest concentration of SARS-CoV-2 viral copies this assay can detect is 250 copies / mL. A negative result does not preclude SARS-CoV-2 infection and should not be used as the sole basis for treatment or other patient management decisions.  A negative result may occur with improper specimen collection / handling, submission of specimen other than nasopharyngeal swab, presence of viral mutation(s) within the areas targeted by this assay, and inadequate number of viral copies (<250 copies / mL). A negative result must be combined with clinical observations, patient history, and epidemiological information.  Fact Sheet for Patients:   StrictlyIdeas.no  Fact Sheet for Healthcare  Providers: BankingDealers.co.za  This test is not yet approved or  cleared by the [place] FDA and has been authorized for detection and/or diagnosis of SARS-CoV-2 by FDA under an Emergency Use Authorization (EUA).  This EUA will remain in effect (meaning this test can be used) for the duration of the COVID-19 declaration under Section 564(b)(1) of the Act, 21 U.S.C. section 360bbb-3(b)(1), unless the authorization is terminated or revoked sooner.  Performed at [facility], [address] [place], [place] [address]   MRSA PCR Screening     Status: None   Collection Time: 09/19/19  4:18 PM   Specimen: Nasopharyngeal  Result Value Ref Range Status   MRSA by PCR NEGATIVE NEGATIVE Final    Comment:        The GeneXpert MRSA Assay (FDA approved for NASAL specimens only), is one component of a comprehensive MRSA colonization surveillance program. It is not intended to diagnose MRSA infection nor to guide or monitor treatment for MRSA infections. Performed at [facility], [address] [place], [place] [address]   SARS Coronavirus 2 by RT PCR (hospital order, performed in [facility] hospital lab) Nasopharyngeal Nasopharyngeal Swab     Status: None   Collection Time: 09/26/19  4:17 PM   Specimen: Nasopharyngeal Swab  Result Value Ref Range Status   SARS Coronavirus 2 NEGATIVE NEGATIVE Final    Comment: (NOTE) SARS-CoV-2 target nucleic acids are NOT DETECTED.  The SARS-CoV-2 RNA is generally detectable in upper and lower respiratory specimens during the acute phase of infection. The lowest concentration of SARS-CoV-2 viral  copies this assay can detect is 250 copies / mL. A negative result does not preclude SARS-CoV-2 infection and should not be used as the sole basis for treatment or other patient management decisions.  A negative result may occur with improper specimen collection / handling, submission of specimen other than  nasopharyngeal swab, presence of viral mutation(s) within the areas targeted by this assay, and inadequate number of viral copies (<250 copies / mL). A negative result must be combined with clinical observations, patient history, and epidemiological information.  Fact Sheet for Patients:   StrictlyIdeas.no  Fact Sheet for Healthcare Providers: BankingDealers.co.za  This test is not yet approved or  cleared by the [place] FDA and has been authorized for detection and/or diagnosis of SARS-CoV-2 by FDA under an Emergency Use Authorization (EUA).  This EUA will remain in effect (meaning this test can be used) for the duration of the COVID-19 declaration under Section 564(b)(1) of the Act, 21 U.S.C. section 360bbb-3(b)(1), unless the authorization is terminated or revoked sooner.  Performed at [facility], [address] [place], [place] [address]     Procedures and diagnostic studies:  CT Angio Chest PE W and/or Wo Contrast  Result Date: 09/26/2019 CLINICAL DATA:  60 year old female with shortness of breath. EXAM: CT ANGIOGRAPHY CHEST WITH CONTRAST TECHNIQUE: Multidetector CT imaging of the chest was performed using the standard protocol during bolus administration of intravenous contrast. Multiplanar CT image reconstructions and MIPs were obtained to evaluate the vascular anatomy. CONTRAST:  78mL OMNIPAQUE IOHEXOL 350 MG/ML SOLN COMPARISON:  Chest CT dated 08/17/2016. FINDINGS: Evaluation of this exam is limited due to respiratory motion artifact. Cardiovascular: There is mild cardiomegaly. No pericardial effusion. 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 The thoracic aorta is unremarkable. Mild dilatation of the main pulmonary trunk suggestive of a degree of pulmonary hypertension. Evaluation of the pulmonary arteries is very limited due to respiratory motion artifact and suboptimal opacification  and timing of the contrast. No large or central pulmonary artery embolus identified. Mediastinum/Nodes: There is no hilar adenopathy. Multiple mildly enlarged mediastinal lymph nodes measure up to 14 mm in the upper mediastinum, new since the prior CT. Mildly rounded anterior mediastinal lymph node measures 7 mm in short axis in the prevascular space. There is a small hiatal hernia. There is ingested content within the esophagus which may represent reflux or none clearance. Esophagram may provide better evaluation if clinically indicated. No mediastinal fluid collection. Lungs/Pleura: There are bilateral lower lobe predominant confluent densities, right greater left, most consistent with pneumonia and concerning for aspiration. Clinical correlation is recommended. There is no pleural effusion or pneumothorax. The central airways are patent. Upper Abdomen: Multiple stones within the gallbladder. A 1 cm right adrenal nodule, indeterminate. Musculoskeletal: Diffuse osseous sclerosis most consistent with renal osteodystrophy. No acute osseous pathology. Review of the MIP images confirms the above findings. IMPRESSION: 1. No CT evidence of central pulmonary artery embolus. 2. Bilateral lower lobe predominant confluent densities most consistent with pneumonia and concerning for aspiration. Clinical correlation is recommended. 3. Small hiatal hernia and retained ingested content within the esophagus. 4. 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 5. Cholelithiasis. 6. Aortic Atherosclerosis (ICD10-I70.0). Electronically Signed   By: Anner Crete M.D.   On: 09/26/2019 18:42   ECHOCARDIOGRAM COMPLETE  Result Date: 09/27/2019    ECHOCARDIOGRAM REPORT   Patient Name:   ELEANNA THEILEN Noah Date of Exam: 09/27/2019 Medical  Rec #:  [id number]     Height:       68.0 in Accession #:    [id number]    Weight:       217.4 lb Date of Birth:  June 07, 1960     BSA:          2.118 m Patient Age:     59 years      BP:           109/68 mmHg Patient Gender: F             HR:           71 bpm. Exam Location:  [facility] Procedure: 2D Echo, Cardiac Doppler, Color Doppler and Intracardiac            Opacification Agent Indications:     R06.00 Dyspnea  History:         Patient has prior history of Echocardiogram examinations. CHF,                  CAD; Risk Factors:Hypertension and Dyslipidemia.  Sonographer:     Charmayne Sheer RDCS (AE) Referring Phys:  [id number] Nicolette Bang Diagnosing Phys: Ida Rogue MD  Sonographer Comments: Suboptimal parasternal window, suboptimal apical window and no subcostal window. Image acquisition challenging due to patient body habitus. IMPRESSIONS  1. Left ventricular ejection fraction, by estimation, is 30 to 35%. The left ventricle has moderately decreased function. The left ventricle demonstrates severe global hypokinesis, wall motion of basal regions best perserved, unable to exclude stress cardiomyopathy. Left ventricular diastolic parameters are consistent with Grade I diastolic dysfunction (impaired relaxation).  2. Right ventricular systolic function is normal. The right ventricular size is normal. FINDINGS  Left Ventricle: Left ventricular ejection fraction, by estimation, is 30 to 35%. The left ventricle has moderately decreased function. The left ventricle demonstrates global hypokinesis. Definity contrast agent was given IV to delineate the left ventricular endocardial borders. The left ventricular internal cavity size was normal in size. There is no left ventricular hypertrophy. Left ventricular diastolic parameters are consistent with Grade I diastolic dysfunction (impaired relaxation). Right Ventricle: The right ventricular size is normal. No increase in right ventricular wall thickness. Right ventricular systolic function is normal. Left Atrium: Left atrial size was normal in size. Right Atrium: Right atrial size was normal in size. Pericardium: There is no evidence  of pericardial effusion. Mitral Valve: The mitral valve is normal in structure. Normal mobility of the mitral valve leaflets. No evidence of mitral valve regurgitation. No evidence of mitral valve stenosis. MV peak gradient, 2.8 mmHg. The mean mitral valve gradient is 1.0 mmHg. Tricuspid Valve: The tricuspid valve is normal in structure. Tricuspid valve regurgitation is not demonstrated. No evidence of tricuspid stenosis. Aortic Valve: The aortic valve was not well visualized. Aortic valve regurgitation is not visualized. No aortic stenosis is present. Aortic valve mean gradient measures 3.0 mmHg. Aortic valve peak gradient measures 6.0 mmHg. Aortic valve area, by VTI measures 4.01 cm. Pulmonic Valve: The pulmonic valve was normal in structure. Pulmonic valve regurgitation is trivial. No evidence of pulmonic stenosis. Aorta: The aortic root is normal in size and structure. Venous: The inferior vena cava is normal in size with greater than 50% respiratory variability, suggesting right atrial pressure of 3 mmHg. IAS/Shunts: No atrial level shunt detected by color flow Doppler.  LEFT VENTRICLE PLAX 2D LVIDd:         5.55 cm  Diastology LVIDs:  4.01 cm  LV e' lateral:   4.46 cm/s LV PW:         1.18 cm  LV E/e' lateral: 11.2 LV IVS:        0.89 cm  LV e' medial:    8.70 cm/s LVOT diam:     2.40 cm  LV E/e' medial:  5.8 LV SV:         78 LV SV Index:   37 LVOT Area:     4.52 cm  LEFT ATRIUM             Index LA diam:        4.50 cm 2.13 cm/m LA Vol (A2C):   39.3 ml 18.56 ml/m LA Vol (A4C):   36.0 ml 17.00 ml/m LA Biplane Vol: 38.5 ml 18.18 ml/m  AORTIC VALVE                   PULMONIC VALVE AV Area (Vmax):    3.53 cm    PV Vmax:       1.05 m/s AV Area (Vmean):   3.38 cm    PV Vmean:      65.100 cm/s AV Area (VTI):     4.01 cm    PV VTI:        0.155 m AV Vmax:           122.00 cm/s PV Peak grad:  4.4 mmHg AV Vmean:          85.900 cm/s PV Mean grad:  2.0 mmHg AV VTI:            0.195 m AV Peak Grad:       6.0 mmHg AV Mean Grad:      3.0 mmHg LVOT Vmax:         95.30 cm/s LVOT Vmean:        64.200 cm/s LVOT VTI:          0.173 m LVOT/AV VTI ratio: 0.89  AORTA Ao Root diam: 3.50 cm MITRAL VALVE MV Area (PHT): 6.48 cm    SHUNTS MV Peak grad:  2.8 mmHg    Systemic VTI:  0.17 m MV Mean grad:  1.0 mmHg    Systemic Diam: 2.40 cm MV Vmax:       0.83 m/s MV Vmean:      50.1 cm/s MV Decel Time: 117 msec MV E velocity: 50.10 cm/s MV A velocity: 87.40 cm/s MV E/A ratio:  0.57 Ida Rogue MD Electronically signed by Ida Rogue MD Signature Date/Time: 09/27/2019/11:32:23 AM    Final                LOS: 2 days   Esias Mory  [facility]     09/28/2019, 4:38 PM

## 2019-09-29 LAB — GLUCOSE, CAPILLARY
Glucose-Capillary: 135 mg/dL — ABNORMAL HIGH (ref 70–99)
Glucose-Capillary: 203 mg/dL — ABNORMAL HIGH (ref 70–99)
Glucose-Capillary: 202 mg/dL — ABNORMAL HIGH (ref 70–99)
Glucose-Capillary: 248 mg/dL — ABNORMAL HIGH (ref 70–99)

## 2019-09-29 MED ORDER — FUROSEMIDE 10 MG/ML IJ SOLN
20.0000 mg | Freq: Once | INTRAMUSCULAR | Status: AC
  Administered 2019-09-29: 20 mg via INTRAVENOUS
  Filled 2019-09-29: qty 4

## 2019-09-29 MED ORDER — FUROSEMIDE 10 MG/ML IJ SOLN
40.0000 mg | Freq: Once | INTRAMUSCULAR | Status: AC
  Administered 2019-09-29: 40 mg via INTRAVENOUS
  Filled 2019-09-29: qty 4

## 2019-09-29 NOTE — TOC Initial Note (Signed)
Transition of Care [facility]) - Initial/Assessment Note    Patient Details  Name: Elizabeth Gutierrez MRN: [id number] Date of Birth: 09-Jun-1960  [facility] CM/SW Contact:    Harriet Masson, RN Phone [place] 09/29/2019, 2:57 PM  Clinical Narrative:                  Received orders for screening on HF/COPD. RN spoke with the guardian mother Deletha Jaffee [phone] and educated on HF zones (GREEN/YELLOW/RED) for daily weights, verified pt has scales and someone would be able to document her readings for providers to view if needed. Aware someone needs to be called with any participating symptoms such as SOB, swelling to extremities and/or symptom of fluid retention (3 lbs overnight or 5 lbs within one week). COPD also discussed related to the action plan on what to do if acute symptoms are encountered. Mother requested [employer] RN call the supervisor at the facility [facility] [phone] and reviewed the same information. RN spoke with Mr. Robina Ade and also educated on what needs to be done on a daily bases when monitoring this pt for HF/COPD. Representative also informed the bedside RN would call report upon pt's discharge back to the group home and [facility] RN would provided the detail information with pt's discharge packet for ongoing review at the group home.   Expected Discharge Plan: Group Home Barriers to Discharge: Continued Medical Work up   Patient Goals and CMS Choice Patient states their goals for this hospitalization and ongoing recovery are:: Guardian: for pt to return to the facility      Expected Discharge Plan and Services Expected Discharge Plan: Group Home In-house Referral: Clinical Social Work Discharge Planning Services: CM Consult   Living arrangements for the past 2 months: Group Home                                      Prior Living Arrangements/Services Living arrangements for the past 2 months: [place] Lives with:: Facility  Resident Patient language and need for interpreter reviewed:: No Do you feel safe going back to the place where you live?: Yes      Need for Family Participation in Patient Care: Yes (Comment) (Guardian mother Annelle Behrendt) Care giver support system in place?: Yes (comment)   Criminal Activity/Legal Involvement Pertinent to Current Situation/Hospitalization: No - Comment as needed  Activities of Daily Living Home Assistive Devices/Equipment: Gilford Rile (specify type) ADL Screening (condition at time of admission) Patient's cognitive ability adequate to safely complete daily activities?: No Is the patient deaf or have difficulty hearing?: No Does the patient have difficulty seeing, even when wearing glasses/contacts?: No Does the patient have difficulty concentrating, remembering, or making decisions?: Yes Patient able to express need for assistance with ADLs?: Yes Does the patient have difficulty dressing or bathing?: Yes Independently performs ADLs?: No Does the patient have difficulty walking or climbing stairs?: Yes Weakness of Legs: Both Weakness of Arms/Hands: Both  Permission Sought/Granted Permission sought to share information with : Guardian, Customer service manager Permission granted to share information with : Yes, Verbal Permission Granted  Share Information with NAME: Guardian: Ethyle Tiedt  Permission granted to share info w AGENCY: [place] granted to share info w Contact Information: [facility]    [phone]  Emotional Assessment Appearance:: Appears stated age Attitude/Demeanor/Rapport: Engaged Affect (typically observed): Pleasant, Other (comment) (pleasantly  confused (mental retardation)) Orientation: : Oriented to Self (aware of her mother when discussed Earna Coder))   Psych Involvement: No (comment)  Admission diagnosis:  Shortness of breath [R06.02] Hypoxia [R09.02] Dyspnea, unspecified type [R06.00] Patient Active  Problem List   Diagnosis Date Noted  . Bilateral pneumonia 09/28/2019  . Shortness of breath 09/26/2019  . Hyperlipidemia   . Paranoid schizophrenia ([place])   . Type II diabetes mellitus with renal manifestations ([place])   . Hypothyroidism   . CKD (chronic kidney disease), stage IIIa   . Acute respiratory failure with hypoxia ([place])   . Acute metabolic encephalopathy 37/44/5146  . Altered mental state 08/17/2016  . DDD (degenerative disc disease), lumbar 07/09/2015  . Facet syndrome, lumbar 07/09/2015  . Lumbar radiculopathy 07/09/2015  . Sacroiliac joint dysfunction 07/09/2015  . Spinal stenosis, lumbar region, 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  . Diabetic neuropathy ([place]) 07/09/2015  . CAD in native artery 06/16/2015  . Controlled type 2 diabetes mellitus without complication ([place]) 04/79/9872  . Benign essential HTN 06/16/2015  . Obstructive apnea 01/05/2015  . Esophagitis, reflux 08/06/2014  . Edema leg 05/14/2014  . Chronic systolic heart failure ([place]) 05/14/2014  . TI (tricuspid incompetence) 05/14/2014  . Chest pain 12/13/2013  . Combined fat and carbohydrate induced hyperlipemia 10/09/2013  . Breath shortness 10/09/2013   PCP:  Denton Lank, MD Pharmacy:   [place], [place] [facility] [place] [address] [place] [address] Phone: [phone] Fax: [phone]     Social Determinants of Health (SDOH) Interventions    Readmission Risk Interventions No flowsheet data found.

## 2019-09-29 NOTE — Progress Notes (Addendum)
Progress Note    [name]  BJY:[id number] DOB: 1960/06/19  DOA: 09/26/2019 PCP: Denton Lank, MD      Brief Narrative:    Medical records reviewed and are as summarized below:  Elizabeth Gutierrez is a 59 y.o. female       Assessment/Plan:   Principal Problem:   Bilateral pneumonia Active Problems:   Chronic systolic heart failure ([place])   CAD in native artery   Controlled type 2 diabetes mellitus without complication ([place])   Benign essential HTN   Hyperlipidemia   Hypothyroidism   CKD (chronic kidney disease), stage IIIa   Acute respiratory failure with hypoxia ([place])   Shortness of breath  Pneumonia: Continue empiric IV antibiotics.  Acute hypoxemic respiratory failure: She is requiring 3 to 4 L/min oxygen via nasal cannula. Taper off oxygen as able.    Probable acute CHF as well.  Chronic systolic and diastolic CHF: IV fluids was discontinued on 09/28/2019.  Treat with IV Lasix.  2D echo showed EF estimated at 30 to 08%, grade 1 diastolic dysfunction  Type II DM with hyperglycemia: Hemoglobin A1c was 8.6.  Continue Metformin and NovoLog.  AKI: Creatinine is slowly trending down.  Creatinine was 0.99 on 09/20/2019.  Monitor BMP.  Schizophrenia, mental retardation: Continue psychotropics     Body mass index is 31.91 kg/m. (Obesity): This complicates overall care and prognosis.  Diet Order            Diet heart healthy/carb modified Room service appropriate? Yes; Fluid consistency: Thin  Diet effective now                       Medications:   . ARIPiprazole  30 mg Oral QHS  . aspirin EC  81 mg Oral Daily  . atorvastatin  20 mg Oral QHS  . benztropine  0.5 mg Oral BID  . carvedilol  3.125 mg Oral BID  . enoxaparin (LOVENOX) injection  40 mg Subcutaneous Q24H  . furosemide  20 mg Intravenous Once  . gabapentin  200 mg Oral TID  . insulin aspart  0-5 Units Subcutaneous QHS  . insulin aspart  0-9 Units Subcutaneous TID WC  . levothyroxine  75  mcg Oral Q0600  . lithium carbonate  300 mg Oral Q12H  . metFORMIN  1,000 mg Oral Q breakfast  . pantoprazole  40 mg Oral Daily  . polyethylene glycol  17 g Oral Daily  . senna  1 tablet Oral QHS  . sertraline  100 mg Oral q AM  . traZODone  50 mg Oral QHS   Continuous Infusions: . sodium chloride Stopped (09/29/19 1321)  . piperacillin-tazobactam (ZOSYN)  IV 12.5 mL/hr at 09/29/19 1600     Anti-infectives (From admission, onward)   Start     Dose/Rate Route Frequency Ordered Stop   09/26/19 2200  piperacillin-tazobactam (ZOSYN) IVPB 3.375 g     Discontinue     3.375 g 12.5 mL/hr over 240 Minutes Intravenous Every 8 hours 09/26/19 1925     09/26/19 1900  piperacillin-tazobactam (ZOSYN) IVPB 3.375 g  Status:  Discontinued        3.375 g 100 mL/hr over 30 Minutes Intravenous  Once 09/26/19 1846 09/26/19 1924   09/26/19 1745  levofloxacin (LEVAQUIN) IVPB 750 mg        750 mg 100 mL/hr over 90 Minutes Intravenous  Once 09/26/19 1735 09/26/19 2005  Family Communication/Anticipated D/C date and plan/Code Status   DVT prophylaxis: enoxaparin (LOVENOX) injection 40 mg Start: 09/26/19 2200     Code Status: Full Code  Family Communication: Plan discussed with patient Disposition Plan:    Status is: Inpatient  Remains inpatient appropriate because:IV treatments appropriate due to intensity of illness or inability to take PO and Inpatient level of care appropriate due to severity of illness   Dispo: The patient is from: Home              Anticipated d/c is to: Group home              Anticipated d/c date is: 2 days              Patient currently is not medically stable to d/c.           Subjective:   Overnight events noted.  Patient is still requiring oxygen.  She does not provide much history.  Objective:    Vitals:   09/29/19 1153 09/29/19 1157 09/29/19 1215 09/29/19 2002  BP:    101/68  Pulse:    80  Resp:    20  Temp:    99.2 F (37.3  C)  TempSrc:    Oral  SpO2: (!) 86% (!) 89% 92% 95%  Weight:      Height:       No data found.   Intake/Output Summary (Last 24 hours) at 09/29/2019 2037 Last data filed at 09/29/2019 1800 Gross per 24 hour  Intake 1354.88 ml  Output 2000 ml  Net -645.12 ml   Filed Weights   09/26/19 1554 09/28/19 0459 09/29/19 0500  Weight: 98.6 kg 95.5 kg 95.2 kg    Exam:  GEN: NAD SKIN: Warm and dry EYES: No pallor or icterus ENT: MMM CV: Regular rate and rhythm PULM: Clear to auscultation ABD: soft, ND, NT, +BS CNS: AAO x 3, she is not oriented to situation, non focal EXT: No edema or tenderness   Data Reviewed:   I have personally reviewed following labs and imaging studies:  Labs: Labs show the following:   Basic Metabolic Panel: Recent Labs  Lab 09/26/19 1557 09/26/19 1557 09/27/19 0555 09/28/19 0852  NA 137  --  139 138  K 3.7   < > 4.0 4.9  CL 110  --  102 100  CO2 21*  --  29 30  GLUCOSE 281*  --  170* 320*  BUN 24*  --  24* 19  CREATININE 1.20*  --  1.36* 1.33*  CALCIUM 8.4*  --  10.0 9.9   < > = values in this interval not displayed.   GFR Estimated Creatinine Clearance: 54.9 mL/min (A) (by C-G formula based on SCr of 1.33 mg/dL (H)). Liver Function Tests: No results for input(s): AST, ALT, ALKPHOS, BILITOT, PROT, ALBUMIN in the last 168 hours. No results for input(s): LIPASE, AMYLASE in the last 168 hours. No results for input(s): AMMONIA in the last 168 hours. Coagulation profile No results for input(s): INR, PROTIME in the last 168 hours.  CBC: Recent Labs  Lab 09/26/19 1557 09/27/19 0555 09/28/19 0852  WBC 16.1* 12.4* 12.1*  NEUTROABS 13.6*  --  8.7*  HGB 11.1* 10.2* 10.6*  HCT 35.3* 33.8* 36.0  MCV 85.1 87.1 87.6  PLT 351 340 356   Cardiac Enzymes: No results for input(s): CKTOTAL, CKMB, CKMBINDEX, TROPONINI in the last 168 hours. BNP (last 3 results) No results for input(s): PROBNP in the last  8760 hours. CBG: Recent Labs  Lab  09/28/19 1648 09/28/19 2204 09/29/19 0757 09/29/19 1146 09/29/19 1634  GLUCAP 212* 205* 135* 203* 202*   D-Dimer: No results for input(s): DDIMER in the last 72 hours. Hgb A1c: Recent Labs    09/27/19 0555  HGBA1C 8.6*   Lipid Profile: No results for input(s): CHOL, HDL, LDLCALC, TRIG, CHOLHDL, LDLDIRECT in the last 72 hours. Thyroid function studies: No results for input(s): TSH, T4TOTAL, T3FREE, THYROIDAB in the last 72 hours.  Invalid input(s): FREET3 Anemia work up: No results for input(s): VITAMINB12, FOLATE, FERRITIN, TIBC, IRON, RETICCTPCT in the last 72 hours. Sepsis Labs: Recent Labs  Lab 09/26/19 1557 09/26/19 2115 09/27/19 0555 09/28/19 0852  PROCALCITON  --  0.90 0.88 0.67  WBC 16.1*  --  12.4* 12.1*    Microbiology Recent Results (from the past 240 hour(s))  SARS Coronavirus 2 by RT PCR (hospital order, performed in [facility] hospital lab) Nasopharyngeal Nasopharyngeal Swab     Status: None   Collection Time: 09/26/19  4:17 PM   Specimen: Nasopharyngeal Swab  Result Value Ref Range Status   SARS Coronavirus 2 NEGATIVE NEGATIVE Final    Comment: (NOTE) SARS-CoV-2 target nucleic acids are NOT DETECTED.  The SARS-CoV-2 RNA is generally detectable in upper and lower respiratory specimens during the acute phase of infection. The lowest concentration of SARS-CoV-2 viral copies this assay can detect is 250 copies / mL. A negative result does not preclude SARS-CoV-2 infection and should not be used as the sole basis for treatment or other patient management decisions.  A negative result may occur with improper specimen collection / handling, submission of specimen other than nasopharyngeal swab, presence of viral mutation(s) within the areas targeted by this assay, and inadequate number of viral copies (<250 copies / mL). A negative result must be combined with clinical observations, patient history, and epidemiological information.  Fact Sheet for  Patients:   StrictlyIdeas.no  Fact Sheet for Healthcare Providers: BankingDealers.co.za  This test is not yet approved or  cleared by the [place] FDA and has been authorized for detection and/or diagnosis of SARS-CoV-2 by FDA under an Emergency Use Authorization (EUA).  This EUA will remain in effect (meaning this test can be used) for the duration of the COVID-19 declaration under Section 564(b)(1) of the Act, 21 U.S.C. section 360bbb-3(b)(1), unless the authorization is terminated or revoked sooner.  Performed at [facility], [address] [place], [place] [address]     Procedures and diagnostic studies:  No results found.             LOS: 3 days   Wendie Diskin  [facility]     09/29/2019, 8:37 PM

## 2019-09-29 NOTE — Progress Notes (Signed)
Physical Therapy Evaluation Patient Details Name: Elizabeth Gutierrez MRN: [id number] DOB: Nov 06, 1960 Today's Date: 09/29/2019   History of Present Illness  59 yo female with acute respiratory failure and PNA was admitted, note low sats at rest and fatigue, dizziness.  PMHx:  schizophrenia, MR, CAD, DM, cardiomegaly, CHF, L ventricular apex aneurysm, atherosclerosis  Clinical Impression  Pt was assessed and now orders for PT are discontinued.  Was able to stand and sidestep on side of bed, with poor control of walker and requiring help, but O2 sats were greater with effort.  Pt will be discontinued for now and reassessed when MD determines therapy is again indicated.    Follow Up Recommendations Home health PT;Supervision for mobility/OOB    Equipment Recommendations  Rolling walker with 5" wheels (if she does not have the walker, reports hers is damaged)    Recommendations for Other Services       Precautions / Restrictions Precautions Precautions: Fall Precaution Comments: monitor O2 sats Restrictions Weight Bearing Restrictions: No      Mobility  Bed Mobility Overal bed mobility: Needs Assistance Bed Mobility: Supine to Sit;Sit to Supine     Supine to sit: Min assist Sit to supine: Min assist   General bed mobility comments: min assist to support effort OOB  Transfers Overall transfer level: Needs assistance Equipment used: Rolling walker (2 wheeled);1 person hand held assist Transfers: Sit to/from Stand Sit to Stand: Min guard         General transfer comment: min guard for safety, pt can stand alone  Ambulation/Gait Ambulation/Gait assistance: Min assist Gait Distance (Feet): 16 Feet Assistive device: Rolling walker (2 wheeled);1 person hand held assist Gait Pattern/deviations: Step-to pattern;Decreased stride length;Wide base of support Gait velocity: reduced Gait velocity interpretation: <1.31 ft/sec, indicative of household ambulator General Gait Details:  sidesteps on side of bed with four walks of 4 feet each  Stairs            Wheelchair Mobility    Modified Rankin (Stroke Patients Only)       Balance Overall balance assessment: Needs assistance Sitting-balance support: Feet supported Sitting balance-Leahy Scale: Fair     Standing balance support: Bilateral upper extremity supported;During functional activity Standing balance-Leahy Scale: Poor                               Pertinent Vitals/Pain Pain Assessment: No/denies pain    Home Living Family/patient expects to be discharged to:: Group home                 Additional Comments: level living situation, 24/7 help    Prior Function Level of Independence: Needs assistance   Gait / Transfers Assistance Needed: per pt is using a RW but no need for O2 previously  ADL's / Homemaking Assistance Needed: assistance to bathe and dress        Hand Dominance   Dominant Hand: Right    Extremity/Trunk Assessment   Upper Extremity Assessment Upper Extremity Assessment: Generalized weakness    Lower Extremity Assessment Lower Extremity Assessment: Generalized weakness    Cervical / Trunk Assessment Cervical / Trunk Assessment: Normal  Communication   Communication: Expressive difficulties (speech is unclear at times)  Cognition Arousal/Alertness: Awake/alert Behavior During Therapy: Flat affect Overall Cognitive Status: History of cognitive impairments - at baseline  General Comments: MR at baseline      General Comments General comments (skin integrity, edema, etc.): pt was at 91% sat baseline, stood and stepped with sat 97% moving and returned to 92% on bed, on 2L O2 as was being used when PT arrived    Exercises     Assessment/Plan    PT Assessment Patient needs continued PT services  PT Problem List Decreased strength;Decreased range of motion;Decreased activity tolerance;Decreased  balance;Decreased mobility;Decreased coordination;Decreased cognition;Decreased knowledge of use of DME;Decreased safety awareness;Cardiopulmonary status limiting activity       PT Treatment Interventions DME instruction;Gait training;Functional mobility training;Therapeutic activities;Therapeutic exercise;Balance training;Neuromuscular re-education;Patient/family education    PT Goals (Current goals can be found in the Care Plan section)  Acute Rehab PT Goals Patient Stated Goal: to get stronger PT Goal Formulation: With patient Time For Goal Achievement: 10/13/19 Potential to Achieve Goals: Good    Frequency Min 3X/week   Barriers to discharge   home with support and level living    Co-evaluation               AM-PAC PT "6 Clicks" Mobility  Outcome Measure Help needed turning from your back to your side while in a flat bed without using bedrails?: None Help needed moving from lying on your back to sitting on the side of a flat bed without using bedrails?: None Help needed moving to and from a bed to a chair (including a wheelchair)?: A Little Help needed standing up from a chair using your arms (e.g., wheelchair or bedside chair)?: A Little Help needed to walk in hospital room?: A Little Help needed climbing 3-5 steps with a railing? : A Lot 6 Click Score: 19    End of Session Equipment Utilized During Treatment: Gait belt Activity Tolerance: Patient tolerated treatment well;Patient limited by fatigue;Treatment limited secondary to medical complications (Comment) Patient left: in bed;with call bell/phone within reach;with bed alarm set Nurse Communication: Mobility status PT Visit Diagnosis: Unsteadiness on feet (R26.81);Muscle weakness (generalized) (M62.81)    Time: 1103-1594 PT Time Calculation (min) (ACUTE ONLY): 39 min   Charges:   PT Evaluation $PT Eval Moderate Complexity: 1 Mod PT Treatments $Gait Training: 8-22 mins $Therapeutic Activity: 8-22 mins        Ramond Dial 09/29/2019, 1:08 PM  Mee Hives, PT MS Acute Rehab Dept. Number: [place] [facility] [place]

## 2019-09-29 NOTE — Progress Notes (Signed)
Dr Mal Misty updated on respiratory status. Patient required 4 liters to get sats between 89-92%. Order given for oxygen

## 2019-09-29 NOTE — Progress Notes (Signed)
BP 102/65, HR 78. Dr Mal Misty said to hold am coreg because he is going to give her lasix.

## 2019-09-30 LAB — GLUCOSE, CAPILLARY
Glucose-Capillary: 298 mg/dL — ABNORMAL HIGH (ref 70–99)
Glucose-Capillary: 339 mg/dL — ABNORMAL HIGH (ref 70–99)
Glucose-Capillary: 83 mg/dL (ref 70–99)
Glucose-Capillary: 239 mg/dL — ABNORMAL HIGH (ref 70–99)

## 2019-09-30 LAB — BASIC METABOLIC PANEL
Anion gap: 8 (ref 5–15)
BUN: 26 mg/dL — ABNORMAL HIGH (ref 6–20)
CO2: 34 mmol/L — ABNORMAL HIGH (ref 22–32)
Calcium: 9.9 mg/dL (ref 8.9–10.3)
Chloride: 95 mmol/L — ABNORMAL LOW (ref 98–111)
Creatinine, Ser: 1.52 mg/dL — ABNORMAL HIGH (ref 0.44–1.00)
GFR calc Af Amer: 43 mL/min — ABNORMAL LOW (ref >60)
GFR calc non Af Amer: 37 mL/min — ABNORMAL LOW (ref >60)
Glucose, Bld: 237 mg/dL — ABNORMAL HIGH (ref 70–99)
Potassium: 4.4 mmol/L (ref 3.5–5.1)
Sodium: 137 mmol/L (ref 135–145)

## 2019-09-30 LAB — CBC WITH DIFFERENTIAL/PLATELET
Abs Immature Granulocytes: 0.21 10*3/uL — ABNORMAL HIGH (ref 0.00–0.07)
Basophils Absolute: 0.1 10*3/uL (ref 0.0–0.1)
Basophils Relative: 1 %
Eosinophils Absolute: 0.4 10*3/uL (ref 0.0–0.5)
Eosinophils Relative: 3 %
HCT: 38.8 % (ref 36.0–46.0)
Hemoglobin: 11.8 g/dL — ABNORMAL LOW (ref 12.0–15.0)
Immature Granulocytes: 2 %
Lymphocytes Relative: 22 %
Lymphs Abs: 2.7 10*3/uL (ref 0.7–4.0)
MCH: 25.9 pg — ABNORMAL LOW (ref 26.0–34.0)
MCHC: 30.4 g/dL (ref 30.0–36.0)
MCV: 85.1 fL (ref 80.0–100.0)
Monocytes Absolute: 0.6 10*3/uL (ref 0.1–1.0)
Monocytes Relative: 5 %
Neutro Abs: 8 10*3/uL — ABNORMAL HIGH (ref 1.7–7.7)
Neutrophils Relative %: 67 %
Platelets: 443 10*3/uL — ABNORMAL HIGH (ref 150–400)
RBC: 4.56 MIL/uL (ref 3.87–5.11)
RDW: 14.6 % (ref 11.5–15.5)
WBC: 12 10*3/uL — ABNORMAL HIGH (ref 4.0–10.5)
nRBC: 0 % (ref 0.0–0.2)

## 2019-09-30 MED ORDER — INSULIN GLARGINE 100 UNIT/ML ~~LOC~~ SOLN
12.0000 [IU] | Freq: Every day | SUBCUTANEOUS | Status: DC
  Administered 2019-09-30: 12 [IU] via SUBCUTANEOUS
  Filled 2019-09-30 (×2): qty 0.12

## 2019-09-30 NOTE — Care Management Important Message (Signed)
Important Message  Patient Details  Name: Elizabeth Gutierrez MRN: [id number] Date of Birth: Nov 09, 1960   Medicare Important Message Given:  Yes  Reviewed with patient's legal guardian.  Copy left in patient's room for reference.     Dannette Barbara 09/30/2019, 11:59 AM

## 2019-09-30 NOTE — Progress Notes (Addendum)
Progress Note    Elizabeth Gutierrez  [id number] DOB: July 07, 1960  DOA: 09/26/2019 PCP: Denton Lank, MD      Brief Narrative:    Medical records reviewed and are as summarized below:  Elizabeth Gutierrez is a 59 y.o. female       Assessment/Plan:   Principal Problem:   Bilateral pneumonia Active Problems:   Chronic systolic heart failure ([place])   CAD in native artery   Controlled type 2 diabetes mellitus without complication ([place])   Benign essential HTN   Hyperlipidemia   Hypothyroidism   CKD (chronic kidney disease), stage IIIa   Acute respiratory failure with hypoxia ([place])   Shortness of breath  Pneumonia: Continue empiric IV antibiotics.  Acute hypoxemic respiratory failure: Oxygen saturation is 81% on room air today.  She is on 3.5 L/min oxygen via nasal cannula.  Wean to room air as able.  Patient may need home oxygen if weaning is unsuccessful.  Probable acute CHF as well.  Chronic systolic and diastolic CHF: IV fluids was discontinued on 09/28/2019.  She got IV Lasix on 09/29/2019.  2D echo showed EF estimated at 30 to 50%, grade 1 diastolic dysfunction  Type II DM with hyperglycemia: Hemoglobin A1c was 8.6.  Continue Metformin and NovoLog.  AKI on CKD stage IIIa: Creatinine is worse today.  Baseline creatinine around 1-1.2.  Further chart review shows that patient has underlying CKD stage IIIa.  No IV fluids because of CHF.  Continue monitor BMP.  Schizophrenia, mental retardation: Continue psychotropics     Body mass index is 31.07 kg/m. (Obesity): This complicates overall care and prognosis.  Diet Order            Diet heart healthy/carb modified Room service appropriate? Yes; Fluid consistency: Thin  Diet effective now                       Medications:   . ARIPiprazole  30 mg Oral QHS  . aspirin EC  81 mg Oral Daily  . atorvastatin  20 mg Oral QHS  . benztropine  0.5 mg Oral BID  . carvedilol  3.125 mg Oral BID  . enoxaparin (LOVENOX)  injection  40 mg Subcutaneous Q24H  . gabapentin  200 mg Oral TID  . insulin aspart  0-5 Units Subcutaneous QHS  . insulin aspart  0-9 Units Subcutaneous TID WC  . insulin glargine  12 Units Subcutaneous Daily  . levothyroxine  75 mcg Oral Q0600  . lithium carbonate  300 mg Oral Q12H  . metFORMIN  1,000 mg Oral Q breakfast  . pantoprazole  40 mg Oral Daily  . polyethylene glycol  17 g Oral Daily  . senna  1 tablet Oral QHS  . sertraline  100 mg Oral q AM  . traZODone  50 mg Oral QHS   Continuous Infusions: . sodium chloride Stopped (09/29/19 1321)  . piperacillin-tazobactam (ZOSYN)  IV 3.375 g (09/30/19 1340)     Anti-infectives (From admission, onward)   Start     Dose/Rate Route Frequency Ordered Stop   09/26/19 2200  piperacillin-tazobactam (ZOSYN) IVPB 3.375 g     Discontinue     3.375 g 12.5 mL/hr over 240 Minutes Intravenous Every 8 hours 09/26/19 1925     09/26/19 1900  piperacillin-tazobactam (ZOSYN) IVPB 3.375 g  Status:  Discontinued        3.375 g 100 mL/hr over 30 Minutes Intravenous  Once 09/26/19 1846 09/26/19 1924  09/26/19 1745  levofloxacin (LEVAQUIN) IVPB 750 mg        750 mg 100 mL/hr over 90 Minutes Intravenous  Once 09/26/19 1735 09/26/19 2005             Family Communication/Anticipated D/C date and plan/Code Status   DVT prophylaxis: enoxaparin (LOVENOX) injection 40 mg Start: 09/26/19 2200     Code Status: Full Code  Family Communication: Plan discussed with her mother Disposition Plan:    Status is: Inpatient  Remains inpatient appropriate because:IV treatments appropriate due to intensity of illness or inability to take PO and Inpatient level of care appropriate due to severity of illness   Dispo: The patient is from: Home              Anticipated d/c is to: Group home              Anticipated d/c date is: 2 days              Patient currently is not medically stable to d/c.           Subjective:   No acute events  overnight.  No shortness of breath or chest pain.  However, she still requiring oxygen.  Objective:    Vitals:   09/30/19 0500 09/30/19 1146 09/30/19 1405 09/30/19 1407  BP:  106/69    Pulse:  75    Resp:      Temp:  98.1 F (36.7 C)    TempSrc:  Oral    SpO2:  94% (!) 81% 97%  Weight: 92.7 kg     Height:       No data found.   Intake/Output Summary (Last 24 hours) at 09/30/2019 1421 Last data filed at 09/30/2019 1336 Gross per 24 hour  Intake 1114.88 ml  Output 2100 ml  Net -985.12 ml   Filed Weights   09/28/19 0459 09/29/19 0500 09/30/19 0500  Weight: 95.5 kg 95.2 kg 92.7 kg    Exam:  GEN: NAD SKIN: Warm and dry EYES: No pallor or icterus ENT: MMM CV: Regular rate and rhythm PULM: b/l rales, no wheezing ABD: soft, ND, NT, +BS CNS: Alert and oriented x3.  Non focal.  She knows she is in the hospital for pneumonia. EXT: No edema or tenderness   Data Reviewed:   I have personally reviewed following labs and imaging studies:  Labs: Labs show the following:   Basic Metabolic Panel: Recent Labs  Lab 09/26/19 1557 09/26/19 1557 09/27/19 0555 09/27/19 0555 09/28/19 0852 09/30/19 0621  NA 137  --  139  --  138 137  K 3.7   < > 4.0   < > 4.9 4.4  CL 110  --  102  --  100 95*  CO2 21*  --  29  --  30 34*  GLUCOSE 281*  --  170*  --  320* 237*  BUN 24*  --  24*  --  19 26*  CREATININE 1.20*  --  1.36*  --  1.33* 1.52*  CALCIUM 8.4*  --  10.0  --  9.9 9.9   < > = values in this interval not displayed.   GFR Estimated Creatinine Clearance: 47.4 mL/min (A) (by C-G formula based on SCr of 1.52 mg/dL (H)). Liver Function Tests: No results for input(s): AST, ALT, ALKPHOS, BILITOT, PROT, ALBUMIN in the last 168 hours. No results for input(s): LIPASE, AMYLASE in the last 168 hours. No results for input(s): AMMONIA in the last  168 hours. Coagulation profile No results for input(s): INR, PROTIME in the last 168 hours.  CBC: Recent Labs  Lab 09/26/19 1557  09/27/19 0555 09/28/19 0852 09/30/19 0621  WBC 16.1* 12.4* 12.1* 12.0*  NEUTROABS 13.6*  --  8.7* 8.0*  HGB 11.1* 10.2* 10.6* 11.8*  HCT 35.3* 33.8* 36.0 38.8  MCV 85.1 87.1 87.6 85.1  PLT 351 340 356 443*   Cardiac Enzymes: No results for input(s): CKTOTAL, CKMB, CKMBINDEX, TROPONINI in the last 168 hours. BNP (last 3 results) No results for input(s): PROBNP in the last 8760 hours. CBG: Recent Labs  Lab 09/29/19 1146 09/29/19 1634 09/29/19 2231 09/30/19 0736 09/30/19 1145  GLUCAP 203* 202* 248* 298* 339*   D-Dimer: No results for input(s): DDIMER in the last 72 hours. Hgb A1c: No results for input(s): HGBA1C in the last 72 hours. Lipid Profile: No results for input(s): CHOL, HDL, LDLCALC, TRIG, CHOLHDL, LDLDIRECT in the last 72 hours. Thyroid function studies: No results for input(s): TSH, T4TOTAL, T3FREE, THYROIDAB in the last 72 hours.  Invalid input(s): FREET3 Anemia work up: No results for input(s): VITAMINB12, FOLATE, FERRITIN, TIBC, IRON, RETICCTPCT in the last 72 hours. Sepsis Labs: Recent Labs  Lab 09/26/19 1557 09/26/19 2115 09/27/19 0555 09/28/19 0852 09/30/19 0621  PROCALCITON  --  0.90 0.88 0.67  --   WBC 16.1*  --  12.4* 12.1* 12.0*    Microbiology Recent Results (from the past 240 hour(s))  SARS Coronavirus 2 by RT PCR (hospital order, performed in [facility] hospital lab) Nasopharyngeal Nasopharyngeal Swab     Status: None   Collection Time: 09/26/19  4:17 PM   Specimen: Nasopharyngeal Swab  Result Value Ref Range Status   SARS Coronavirus 2 NEGATIVE NEGATIVE Final    Comment: (NOTE) SARS-CoV-2 target nucleic acids are NOT DETECTED.  The SARS-CoV-2 RNA is generally detectable in upper and lower respiratory specimens during the acute phase of infection. The lowest concentration of SARS-CoV-2 viral copies this assay can detect is 250 copies / mL. A negative result does not preclude SARS-CoV-2 infection and should not be used as the sole  basis for treatment or other patient management decisions.  A negative result may occur with improper specimen collection / handling, submission of specimen other than nasopharyngeal swab, presence of viral mutation(s) within the areas targeted by this assay, and inadequate number of viral copies (<250 copies / mL). A negative result must be combined with clinical observations, patient history, and epidemiological information.  Fact Sheet for Patients:   StrictlyIdeas.no  Fact Sheet for Healthcare Providers: BankingDealers.co.za  This test is not yet approved or  cleared by the [place] FDA and has been authorized for detection and/or diagnosis of SARS-CoV-2 by FDA under an Emergency Use Authorization (EUA).  This EUA will remain in effect (meaning this test can be used) for the duration of the COVID-19 declaration under Section 564(b)(1) of the Act, 21 U.S.C. section 360bbb-3(b)(1), unless the authorization is terminated or revoked sooner.  Performed at [facility], [address] [place], [place] [address]     Procedures and diagnostic studies:  No results found.             LOS: 4 days   Lameeka Schleifer  [facility]     09/30/2019, 2:21 PM

## 2019-10-01 DIAGNOSIS — J9621 Acute and chronic respiratory failure with hypoxia: Secondary | ICD-10-CM

## 2019-10-01 LAB — BASIC METABOLIC PANEL
Anion gap: 8 (ref 5–15)
BUN: 22 mg/dL — ABNORMAL HIGH (ref 6–20)
CO2: 34 mmol/L — ABNORMAL HIGH (ref 22–32)
Calcium: 9.9 mg/dL (ref 8.9–10.3)
Chloride: 95 mmol/L — ABNORMAL LOW (ref 98–111)
Creatinine, Ser: 1.31 mg/dL — ABNORMAL HIGH (ref 0.44–1.00)
GFR calc Af Amer: 52 mL/min — ABNORMAL LOW (ref >60)
GFR calc non Af Amer: 44 mL/min — ABNORMAL LOW (ref >60)
Glucose, Bld: 224 mg/dL — ABNORMAL HIGH (ref 70–99)
Potassium: 4.3 mmol/L (ref 3.5–5.1)
Sodium: 137 mmol/L (ref 135–145)

## 2019-10-01 LAB — CBC WITH DIFFERENTIAL/PLATELET
Abs Immature Granulocytes: 0.16 10*3/uL — ABNORMAL HIGH (ref 0.00–0.07)
Basophils Absolute: 0.1 10*3/uL (ref 0.0–0.1)
Basophils Relative: 1 %
Eosinophils Absolute: 0.4 10*3/uL (ref 0.0–0.5)
Eosinophils Relative: 3 %
HCT: 37.7 % (ref 36.0–46.0)
Hemoglobin: 11.6 g/dL — ABNORMAL LOW (ref 12.0–15.0)
Immature Granulocytes: 1 %
Lymphocytes Relative: 20 %
Lymphs Abs: 2.5 10*3/uL (ref 0.7–4.0)
MCH: 26.4 pg (ref 26.0–34.0)
MCHC: 30.8 g/dL (ref 30.0–36.0)
MCV: 85.7 fL (ref 80.0–100.0)
Monocytes Absolute: 0.7 10*3/uL (ref 0.1–1.0)
Monocytes Relative: 5 %
Neutro Abs: 8.5 10*3/uL — ABNORMAL HIGH (ref 1.7–7.7)
Neutrophils Relative %: 70 %
Platelets: 457 10*3/uL — ABNORMAL HIGH (ref 150–400)
RBC: 4.4 MIL/uL (ref 3.87–5.11)
RDW: 14.4 % (ref 11.5–15.5)
WBC: 12.2 10*3/uL — ABNORMAL HIGH (ref 4.0–10.5)
nRBC: 0 % (ref 0.0–0.2)

## 2019-10-01 LAB — GLUCOSE, CAPILLARY
Glucose-Capillary: 165 mg/dL — ABNORMAL HIGH (ref 70–99)
Glucose-Capillary: 270 mg/dL — ABNORMAL HIGH (ref 70–99)
Glucose-Capillary: 277 mg/dL — ABNORMAL HIGH (ref 70–99)
Glucose-Capillary: 223 mg/dL — ABNORMAL HIGH (ref 70–99)
Glucose-Capillary: 190 mg/dL — ABNORMAL HIGH (ref 70–99)

## 2019-10-01 MED ORDER — FUROSEMIDE 20 MG PO TABS: 20.0000 mg | ORAL_TABLET | Freq: Every day | ORAL | 0 refills | Status: AC | PRN

## 2019-10-01 MED ORDER — INSULIN GLARGINE 100 UNIT/ML ~~LOC~~ SOLN
15.0000 [IU] | Freq: Every day | SUBCUTANEOUS | Status: DC
  Administered 2019-10-01 – 2019-10-02 (×2): 15 [IU] via SUBCUTANEOUS
  Filled 2019-10-01 (×3): qty 0.15

## 2019-10-01 MED ORDER — AMOXICILLIN-POT CLAVULANATE 875-125 MG PO TABS
1.0000 | ORAL_TABLET | Freq: Two times a day (BID) | ORAL | 0 refills | Status: AC
Start: 2019-10-01 — End: 2019-10-03

## 2019-10-01 NOTE — TOC Transition Note (Signed)
Transition of Care [facility]) - CM/SW Discharge Note   Patient Details  Name: Elizabeth Gutierrez MRN: [id number] Date of Birth: 1960-07-23  Transition of Care [facility]) CM/SW Contact:  Beverly Sessions, RN Phone Number: 10/01/2019, 3:33 PM   Clinical Narrative:    Patient to discharge back to group home today Patient will require O2 at time of discharge O2 referral made to [facility] with [employer]   RNCM confirmed with Mr Robina Ade and Gena Fray from [name] that patient can return with O2. All clinical Faxed to Gena Fray  Patient will require EMS transport at discharge EMS packet printed.  Once confirmed that O2 has been delivered to group home, will cal EMS  PT has assessed patient and recommends home health PT.  Per Butch Penny their preference of agency is [facility].   RNCM contact patients guardian Blanch Media.  She is in agreement with discharge plan.  State she does not have a preference of home health agency.  Referral made to [place] with [facility].     Final next level of care: Group Home Barriers to Discharge: No Barriers Identified   Patient Goals and CMS Choice Patient states their goals for this hospitalization and ongoing recovery are:: Guardian: for pt to return to the facility      Discharge Placement                Patient to be transferred to facility by: EMS Name of family member notified: Blanch Media Patient and family notified of of transfer: 10/01/19  Discharge Plan and Services In-house Referral: Clinical Social Work Discharge Planning Services: CM Consult            DME Arranged: Oxygen DME Agency: [employer] Date DME Agency Contacted: 10/01/19   Representative spoke with at DME Agency: zack HH Arranged: PT [place]: [facility] [employer] Date [place]: 10/01/19   Representative spoke with at [place]: [facility]  Social Determinants of Health ([place]) Interventions     Readmission Risk Interventions No flowsheet data  found.

## 2019-10-01 NOTE — NC FL2 (Addendum)
[place] LEVEL OF CARE SCREENING TOOL     IDENTIFICATION  Patient Name: Elizabeth Gutierrez Birthdate: 07-01-1960 Sex: female Admission Date (Current Location): 09/26/2019  [place] and [place] Number:  Engineering geologist and Address:  [facility], [address] [place], [place] [address]      Provider Number: [id number]  Attending Physician Name and Address:  Jennye Boroughs, MD  Relative Name and Phone Number:  Anmarie Fukushima [phone]    Current Level of Care: Hospital Recommended Level of Care: Other (Comment) (Group home) Prior Approval Number:    Date Approved/Denied:   PASRR Number:    Discharge Plan: Other (Comment) (group home)    Current Diagnoses: Patient Active Problem List   Diagnosis Date Noted  . Bilateral pneumonia 09/28/2019  . Shortness of breath 09/26/2019  . Hyperlipidemia   . Paranoid schizophrenia ([place])   . Type II diabetes mellitus with renal manifestations ([place])   . Hypothyroidism   . CKD (chronic kidney disease), stage IIIa   . Acute on chronic respiratory failure with hypoxia ([place])   . Acute metabolic encephalopathy 69/62/9528  . Altered mental state 08/17/2016  . DDD (degenerative disc disease), lumbar 07/09/2015  . Facet syndrome, lumbar 07/09/2015  . Lumbar radiculopathy 07/09/2015  . Sacroiliac joint dysfunction 07/09/2015  . Spinal stenosis, lumbar region, 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  . Diabetic neuropathy ([place]) 07/09/2015  . CAD in native artery 06/16/2015  . Controlled type 2 diabetes mellitus without complication ([place]) 41/32/4401  . Benign essential HTN 06/16/2015  . Obstructive apnea 01/05/2015  . Esophagitis, reflux 08/06/2014  . Edema leg 05/14/2014  . Chronic systolic heart failure ([place]) 05/14/2014  . TI (tricuspid incompetence) 05/14/2014  . Chest pain 12/13/2013  . Combined fat and carbohydrate induced hyperlipemia 10/09/2013  . Breath shortness 10/09/2013    Orientation  RESPIRATION BLADDER Height & Weight     Self, Time, Place  O2 (2L) Continent Weight: 93 kg Height:  5\' 8"  (172.7 cm)  BEHAVIORAL SYMPTOMS/MOOD NEUROLOGICAL BOWEL NUTRITION STATUS      Continent Diet (Heart Health Carb modifed)  AMBULATORY STATUS COMMUNICATION OF NEEDS Skin   Limited Assist Verbally Normal                       Personal Care Assistance Level of Assistance              Functional Limitations Info             SPECIAL CARE FACTORS FREQUENCY  PT (By licensed PT)                    Contractures Contractures Info: Not present    Additional Factors Info  Code Status Code Status Info: Full Allergies Info: NKDA            Medications  acetaminophen 325 MG tablet Commonly known as: TYLENOL Take 650 mg by mouth every 4 (four) hours as needed for moderate pain, fever or headache.         Icon medications to start taking   amoxicillin-clavulanate 875-125 MG tablet Commonly known as: Augmentin Take 1 tablet by mouth 2 (two) times daily for 2 days.  10/01/19        ARIPiprazole 30 MG tablet Commonly known as: ABILIFY Take 1 tablet (30 mg total) by mouth at bedtime.  10/01/19        aspirin 81 MG tablet Take 81 mg by mouth daily.  10/02/19  atorvastatin 20 MG tablet Commonly known as: LIPITOR Take 20 mg by mouth at bedtime.  10/01/19        benztropine 0.5 MG tablet Commonly known as: COGENTIN Take 0.5 mg by mouth 2 (two) times daily.  10/01/19        carvedilol 3.125 MG tablet Commonly known as: COREG Take 3.125 mg by mouth 2 (two) times daily.  10/01/19        Diabetic Tussin DM 10-100 MG/5ML liquid Take by mouth every 4 (four) hours as needed for cough. Generic drug: dextromethorphan-guaiFENesin          docusate sodium 100 MG capsule Commonly known as: COLACE Take 100 mg by mouth 2 (two) times daily as needed for mild constipation.          fluPHENAZine decanoate 25 MG/ML injection Commonly known as: PROLIXIN Inject 31.25 mg into  the muscle every 14 (fourteen) days.          fluticasone 50 MCG/ACT nasal spray Commonly known as: FLONASE Place 2 sprays into both nostrils daily.  Resume        Icon medications to start taking   furosemide 20 MG tablet Commonly known as: Lasix Take 1 tablet (20 mg total) by mouth daily as needed for fluid or edema.          gabapentin 100 MG capsule Commonly known as: NEURONTIN Take 200 mg by mouth 3 (three) times daily.  10/01/19        levothyroxine 75 MCG tablet Commonly known as: SYNTHROID Take 75 mcg by mouth daily before breakfast.  10/02/19        lithium carbonate 300 MG CR tablet Commonly known as: LITHOBID Take 300 mg by mouth every 12 (twelve) hours.  10/01/19        metFORMIN 500 MG 24 hr tablet Commonly known as: GLUCOPHAGE-XR Take 1,000 mg by mouth daily with breakfast.  10/02/19        MiraLax 17 g packet Take 17 g by mouth daily. Generic drug: polyethylene glycol  10/02/19        naproxen 500 MG tablet Commonly known as: NAPROSYN Take 500 mg by mouth 2 (two) times daily with a meal.  Resume          nystatin cream Commonly known as: MYCOSTATIN Apply 1 application topically 4 (four) times daily.  Resume         omeprazole 20 MG capsule Commonly known as: PRILOSEC Take 20 mg by mouth 2 (two) times daily.  Resume         senna 8.6 MG tablet Commonly known as: SENOKOT Take 1 tablet by mouth at bedtime.  10/01/19        sertraline 100 MG tablet Commonly known as: ZOLOFT Take 100 mg by mouth in the morning.  10/02/19        traZODone 50 MG tablet Commonly known as: DESYREL Take 1 tablet (50 mg total) by mouth at bedtime.          - Standard PRN - Oxygen 2 liters continuous  Relevant Imaging Results:  Relevant Lab Results:   Additional Information SSN:[id number]  Mikale Silversmith, Illene Silver, RN

## 2019-10-01 NOTE — Progress Notes (Signed)
Received a call from [place] at [facility]. The O2 delivery has not arrived yet, last she heard form them was 1730, unable to get updates since. She will not be able to stay any longer tonight and advised that we will have to contact [facility] tomorrow AM and pt will have to stay overnight. Will pass message to day RN. Agricultural consultant, on call NP, and pt informed of this update. I tried calling pt's guardian - no answer, will try calling again in the AM.

## 2019-10-01 NOTE — Discharge Summary (Signed)
Physician Discharge Summary  THOMASA HEIDLER KXF:[id number] DOB: 08-26-1960 DOA: 09/26/2019  PCP: Denton Lank, MD  Admit date: 09/26/2019 Discharge date: 10/01/2019  Discharge disposition: Group home   Recommendations for Outpatient Follow-Up:   1. Outpatient follow-up with PCP in 1 week 2. Outpatient follow-up with nephrologist in 2 weeks   Discharge Diagnosis:   Principal Problem:   Bilateral pneumonia Active Problems:   Chronic systolic heart failure (HCC)   CAD in native artery   Controlled type 2 diabetes mellitus without complication (HCC)   Benign essential HTN   Hyperlipidemia   Hypothyroidism   CKD (chronic kidney disease), stage IIIa   Acute on chronic respiratory failure with hypoxia (HCC)   Shortness of breath    Discharge Condition: Stable.  Diet recommendation:  Diet Order            Diet - low sodium heart healthy           Diet Carb Modified                       Code Status: Full Code     Hospital Course:   Ms. Brinsley Wence is a 59 year old woman with past medical history significant for chronic systolic CHF, CAD, type 2 diabetes mellitus, CKD stage IIIa, hyperlipidemia, hypertension, schizophrenia, mental retardation, hypothyroidism.  She presented to the hospital because of increasing shortness of breath and oxygen desaturation in the 70s on room air.  She was admitted to the hospital for bilateral pneumonia and acute hypoxemic respiratory failure.  She was treated with IV fluids, empiric IV antibiotics and oxygen therapy.  She also required IV Lasix for probable acute exacerbation of underlying chronic systolic CHF.  Her condition has improved but she could not be weaned off of oxygen.  Oxygen saturation on room air at rest was 84% and oxygen saturation on room air while ambulating was 81%.  Oxygenation improved to 91% on 2 L/min oxygen via nasal cannula.  She will be discharged to the group on 2 L/min oxygen.  Overall, her condition has  improved and she is deemed stable for discharge today.    Discharge Exam:   Vitals:   10/01/19 0455 10/01/19 1148  BP: 110/66 113/65  Pulse: 75 74  Resp: 20   Temp: 98.1 F (36.7 C) 98.5 F (36.9 C)  SpO2: 94% 93%   Vitals:   09/30/19 1407 09/30/19 1938 10/01/19 0455 10/01/19 1148  BP:  113/73 110/66 113/65  Pulse:  78 75 74  Resp:  20 20   Temp:  98.2 F (36.8 C) 98.1 F (36.7 C) 98.5 F (36.9 C)  TempSrc:  Oral Oral Oral  SpO2: 97% 95% 94% 93%  Weight:   93 kg   Height:         GEN: NAD SKIN: No rash EYES: EOMI ENT: MMM CV: RRR PULM: Bibasilar rales, no wheezing ABD: soft, ND, NT, +BS CNS: AAO x 3, non focal EXT: No edema or tenderness   The results of significant diagnostics from this hospitalization (including imaging, microbiology, ancillary and laboratory) are listed below for reference.     Procedures and Diagnostic Studies:   ECHOCARDIOGRAM COMPLETE  Result Date: 09/27/2019    ECHOCARDIOGRAM REPORT   Patient Name:   ELISAMA THISSEN Date of Exam: 09/27/2019 Medical Rec #:  [id number]     Height:       68.0 in Accession #:    [id number]    Weight:  217.4 lb Date of Birth:  October 11, 1960     BSA:          2.118 m Patient Age:    59 years      BP:           109/68 mmHg Patient Gender: F             HR:           71 bpm. Exam Location:  [facility] Procedure: 2D Echo, Cardiac Doppler, Color Doppler and Intracardiac            Opacification Agent Indications:     R06.00 Dyspnea  History:         Patient has prior history of Echocardiogram examinations. CHF,                  CAD; Risk Factors:Hypertension and Dyslipidemia.  Sonographer:     Charmayne Sheer RDCS (AE) Referring Phys:  [id number] Nicolette Bang Diagnosing Phys: Ida Rogue MD  Sonographer Comments: Suboptimal parasternal window, suboptimal apical window and no subcostal window. Image acquisition challenging due to patient body habitus. IMPRESSIONS  1. Left ventricular ejection fraction, by estimation, is 30  to 35%. The left ventricle has moderately decreased function. The left ventricle demonstrates severe global hypokinesis, wall motion of basal regions best perserved, unable to exclude stress cardiomyopathy. Left ventricular diastolic parameters are consistent with Grade I diastolic dysfunction (impaired relaxation).  2. Right ventricular systolic function is normal. The right ventricular size is normal. FINDINGS  Left Ventricle: Left ventricular ejection fraction, by estimation, is 30 to 35%. The left ventricle has moderately decreased function. The left ventricle demonstrates global hypokinesis. Definity contrast agent was given IV to delineate the left ventricular endocardial borders. The left ventricular internal cavity size was normal in size. There is no left ventricular hypertrophy. Left ventricular diastolic parameters are consistent with Grade I diastolic dysfunction (impaired relaxation). Right Ventricle: The right ventricular size is normal. No increase in right ventricular wall thickness. Right ventricular systolic function is normal. Left Atrium: Left atrial size was normal in size. Right Atrium: Right atrial size was normal in size. Pericardium: There is no evidence of pericardial effusion. Mitral Valve: The mitral valve is normal in structure. Normal mobility of the mitral valve leaflets. No evidence of mitral valve regurgitation. No evidence of mitral valve stenosis. MV peak gradient, 2.8 mmHg. The mean mitral valve gradient is 1.0 mmHg. Tricuspid Valve: The tricuspid valve is normal in structure. Tricuspid valve regurgitation is not demonstrated. No evidence of tricuspid stenosis. Aortic Valve: The aortic valve was not well visualized. Aortic valve regurgitation is not visualized. No aortic stenosis is present. Aortic valve mean gradient measures 3.0 mmHg. Aortic valve peak gradient measures 6.0 mmHg. Aortic valve area, by VTI measures 4.01 cm. Pulmonic Valve: The pulmonic valve was normal in  structure. Pulmonic valve regurgitation is trivial. No evidence of pulmonic stenosis. Aorta: The aortic root is normal in size and structure. Venous: The inferior vena cava is normal in size with greater than 50% respiratory variability, suggesting right atrial pressure of 3 mmHg. IAS/Shunts: No atrial level shunt detected by color flow Doppler.  LEFT VENTRICLE PLAX 2D LVIDd:         5.55 cm  Diastology LVIDs:         4.01 cm  LV e' lateral:   4.46 cm/s LV PW:         1.18 cm  LV E/e' lateral: 11.2 LV IVS:  0.89 cm  LV e' medial:    8.70 cm/s LVOT diam:     2.40 cm  LV E/e' medial:  5.8 LV SV:         78 LV SV Index:   37 LVOT Area:     4.52 cm  LEFT ATRIUM             Index LA diam:        4.50 cm 2.13 cm/m LA Vol (A2C):   39.3 ml 18.56 ml/m LA Vol (A4C):   36.0 ml 17.00 ml/m LA Biplane Vol: 38.5 ml 18.18 ml/m  AORTIC VALVE                   PULMONIC VALVE AV Area (Vmax):    3.53 cm    PV Vmax:       1.05 m/s AV Area (Vmean):   3.38 cm    PV Vmean:      65.100 cm/s AV Area (VTI):     4.01 cm    PV VTI:        0.155 m AV Vmax:           122.00 cm/s PV Peak grad:  4.4 mmHg AV Vmean:          85.900 cm/s PV Mean grad:  2.0 mmHg AV VTI:            0.195 m AV Peak Grad:      6.0 mmHg AV Mean Grad:      3.0 mmHg LVOT Vmax:         95.30 cm/s LVOT Vmean:        64.200 cm/s LVOT VTI:          0.173 m LVOT/AV VTI ratio: 0.89  AORTA Ao Root diam: 3.50 cm MITRAL VALVE MV Area (PHT): 6.48 cm    SHUNTS MV Peak grad:  2.8 mmHg    Systemic VTI:  0.17 m MV Mean grad:  1.0 mmHg    Systemic Diam: 2.40 cm MV Vmax:       0.83 m/s MV Vmean:      50.1 cm/s MV Decel Time: 117 msec MV E velocity: 50.10 cm/s MV A velocity: 87.40 cm/s MV E/A ratio:  0.57 Ida Rogue MD Electronically signed by Ida Rogue MD Signature Date/Time: 09/27/2019/11:32:23 AM    Final      Labs:   Basic Metabolic Panel: Recent Labs  Lab 09/26/19 1557 09/26/19 1557 09/27/19 0555 09/27/19 0555 09/28/19 8638 09/28/19 0852  09/30/19 0621 10/01/19 0512  NA 137  --  139  --  138  --  137 137  K 3.7   < > 4.0   < > 4.9   < > 4.4 4.3  CL 110  --  102  --  100  --  95* 95*  CO2 21*  --  29  --  30  --  34* 34*  GLUCOSE 281*  --  170*  --  320*  --  237* 224*  BUN 24*  --  24*  --  19  --  26* 22*  CREATININE 1.20*  --  1.36*  --  1.33*  --  1.52* 1.31*  CALCIUM 8.4*  --  10.0  --  9.9  --  9.9 9.9   < > = values in this interval not displayed.   GFR Estimated Creatinine Clearance: 55.1 mL/min (A) (by C-G formula based on SCr of 1.31 mg/dL (H)). Liver Function  Tests: No results for input(s): AST, ALT, ALKPHOS, BILITOT, PROT, ALBUMIN in the last 168 hours. No results for input(s): LIPASE, AMYLASE in the last 168 hours. No results for input(s): AMMONIA in the last 168 hours. Coagulation profile No results for input(s): INR, PROTIME in the last 168 hours.  CBC: Recent Labs  Lab 09/26/19 1557 09/27/19 0555 09/28/19 0852 09/30/19 0621 10/01/19 0512  WBC 16.1* 12.4* 12.1* 12.0* 12.2*  NEUTROABS 13.6*  --  8.7* 8.0* 8.5*  HGB 11.1* 10.2* 10.6* 11.8* 11.6*  HCT 35.3* 33.8* 36.0 38.8 37.7  MCV 85.1 87.1 87.6 85.1 85.7  PLT 351 340 356 443* 457*   Cardiac Enzymes: No results for input(s): CKTOTAL, CKMB, CKMBINDEX, TROPONINI in the last 168 hours. BNP: Invalid input(s): POCBNP CBG: Recent Labs  Lab 09/30/19 1701 09/30/19 2151 10/01/19 0752 10/01/19 1132 10/01/19 1146  GLUCAP 83 239* 165* 277* 270*   D-Dimer No results for input(s): DDIMER in the last 72 hours. Hgb A1c No results for input(s): HGBA1C in the last 72 hours. Lipid Profile No results for input(s): CHOL, HDL, LDLCALC, TRIG, CHOLHDL, LDLDIRECT in the last 72 hours. Thyroid function studies No results for input(s): TSH, T4TOTAL, T3FREE, THYROIDAB in the last 72 hours.  Invalid input(s): FREET3 Anemia work up No results for input(s): VITAMINB12, FOLATE, FERRITIN, TIBC, IRON, RETICCTPCT in the last 72 hours. Microbiology Recent  Results (from the past 240 hour(s))  SARS Coronavirus 2 by RT PCR (hospital order, performed in [facility] hospital lab) Nasopharyngeal Nasopharyngeal Swab     Status: None   Collection Time: 09/26/19  4:17 PM   Specimen: Nasopharyngeal Swab  Result Value Ref Range Status   SARS Coronavirus 2 NEGATIVE NEGATIVE Final    Comment: (NOTE) SARS-CoV-2 target nucleic acids are NOT DETECTED.  The SARS-CoV-2 RNA is generally detectable in upper and lower respiratory specimens during the acute phase of infection. The lowest concentration of SARS-CoV-2 viral copies this assay can detect is 250 copies / mL. A negative result does not preclude SARS-CoV-2 infection and should not be used as the sole basis for treatment or other patient management decisions.  A negative result may occur with improper specimen collection / handling, submission of specimen other than nasopharyngeal swab, presence of viral mutation(s) within the areas targeted by this assay, and inadequate number of viral copies (<250 copies / mL). A negative result must be combined with clinical observations, patient history, and epidemiological information.  Fact Sheet for Patients:   StrictlyIdeas.no  Fact Sheet for Healthcare Providers: BankingDealers.co.za  This test is not yet approved or  cleared by the [place] FDA and has been authorized for detection and/or diagnosis of SARS-CoV-2 by FDA under an Emergency Use Authorization (EUA).  This EUA will remain in effect (meaning this test can be used) for the duration of the COVID-19 declaration under Section 564(b)(1) of the Act, 21 U.S.C. section 360bbb-3(b)(1), unless the authorization is terminated or revoked sooner.  Performed at [facility], [address] [place], [place] [address]      Discharge Instructions:   Discharge Instructions    Diet - low sodium heart healthy   Complete by: As directed     Diet Carb Modified   Complete by: As directed    Face-to-face encounter (required for Medicare/Medicaid patients)   Complete by: As directed    I [place] certify that this patient is under my care and that I, or a nurse practitioner or physician's assistant working with me, had a  face-to-face encounter that meets the physician face-to-face encounter requirements with this patient on 10/01/2019. The encounter with the patient was in whole, or in part for the following medical condition(s) which is the primary reason for home health care (List medical condition): Debility, CHF   The encounter with the patient was in whole, or in part, for the following medical condition, which is the primary reason for home health care: Debility, CHF   I certify that, based on my findings, the following services are medically necessary home health services: Physical therapy   Reason for Medically Necessary Home Health Services: Therapy- Personnel officer, Public librarian   My clinical findings support the need for the above services:  Shortness of breath with activity Cognitive impairments, dementia, or mental confusion  that make it unsafe to leave home     Further, I certify that my clinical findings support that this patient is homebound due to: Shortness of Breath with activity   Home Health   Complete by: As directed    To provide the following care/treatments: PT   Increase activity slowly   Complete by: As directed      Allergies as of 10/01/2019   No Known Allergies     Medication List    TAKE these medications   acetaminophen 325 MG tablet Commonly known as: TYLENOL Take 650 mg by mouth every 4 (four) hours as needed for moderate pain, fever or headache.   amoxicillin-clavulanate 875-125 MG tablet Commonly known as: Augmentin Take 1 tablet by mouth 2 (two) times daily for 2 days.   ARIPiprazole 30 MG tablet Commonly known as: ABILIFY Take 1 tablet (30 mg total) by mouth  at bedtime.   aspirin 81 MG tablet Take 81 mg by mouth daily.   atorvastatin 20 MG tablet Commonly known as: LIPITOR Take 20 mg by mouth at bedtime.   benztropine 0.5 MG tablet Commonly known as: COGENTIN Take 0.5 mg by mouth 2 (two) times daily.   carvedilol 3.125 MG tablet Commonly known as: COREG Take 3.125 mg by mouth 2 (two) times daily.   Diabetic Tussin DM 10-100 MG/5ML liquid Generic drug: dextromethorphan-guaiFENesin Take by mouth every 4 (four) hours as needed for cough.   docusate sodium 100 MG capsule Commonly known as: COLACE Take 100 mg by mouth 2 (two) times daily as needed for mild constipation.   fluPHENAZine decanoate 25 MG/ML injection Commonly known as: PROLIXIN Inject 31.25 mg into the muscle every 14 (fourteen) days.   fluticasone 50 MCG/ACT nasal spray Commonly known as: FLONASE Place 2 sprays into both nostrils daily.   furosemide 20 MG tablet Commonly known as: Lasix Take 1 tablet (20 mg total) by mouth daily as needed for fluid or edema.   gabapentin 100 MG capsule Commonly known as: NEURONTIN Take 200 mg by mouth 3 (three) times daily.   levothyroxine 75 MCG tablet Commonly known as: SYNTHROID Take 75 mcg by mouth daily before breakfast.   lithium carbonate 300 MG CR tablet Commonly known as: LITHOBID Take 300 mg by mouth every 12 (twelve) hours.   metFORMIN 500 MG 24 hr tablet Commonly known as: GLUCOPHAGE-XR Take 1,000 mg by mouth daily with breakfast.   MiraLax 17 g packet Generic drug: polyethylene glycol Take 17 g by mouth daily.   naproxen 500 MG tablet Commonly known as: NAPROSYN Take 500 mg by mouth 2 (two) times daily with a meal.   nystatin cream Commonly known as: MYCOSTATIN Apply 1 application topically 4 (four)  times daily.   omeprazole 20 MG capsule Commonly known as: PRILOSEC Take 20 mg by mouth 2 (two) times daily.   senna 8.6 MG tablet Commonly known as: SENOKOT Take 1 tablet by mouth at bedtime.    sertraline 100 MG tablet Commonly known as: ZOLOFT Take 100 mg by mouth in the morning.   traZODone 50 MG tablet Commonly known as: DESYREL Take 1 tablet (50 mg total) by mouth at bedtime.            Durable Medical Equipment  (From admission, onward)         Start     Ordered   10/01/19 1337  DME Oxygen  Once       Question Answer Comment  Length of Need Lifetime   Mode or (Route) Nasal cannula   Liters per Minute 2   Frequency Continuous (stationary and portable oxygen unit needed)   Oxygen delivery system Gas      10/01/19 1337          Follow-up Information    Lateef, Munsoor, MD. Schedule an appointment as soon as possible for a visit in 1 week.   Specialty: Nephrology Why: left a message for them to call patient Contact information: [place] [address] [phone]                Time coordinating discharge: 33 minutes  Signed:  Jennye Boroughs  [facility] 10/01/2019, 2:04 PM

## 2019-10-01 NOTE — Progress Notes (Signed)
SATURATION QUALIFICATIONS: (This note is used to comply with regulatory documentation for home oxygen)  Patient Saturations on Room Air at Rest = 84%  Patient Saturations on Room Air while Ambulating = 81%  Patient Saturations on 2 Liters of oxygen while Ambulating = 91%  Please briefly explain why patient needs home oxygen:

## 2019-10-02 LAB — GLUCOSE, CAPILLARY
Glucose-Capillary: 182 mg/dL — ABNORMAL HIGH (ref 70–99)
Glucose-Capillary: 273 mg/dL — ABNORMAL HIGH (ref 70–99)
Glucose-Capillary: 153 mg/dL — ABNORMAL HIGH (ref 70–99)
Glucose-Capillary: 188 mg/dL — ABNORMAL HIGH (ref 70–99)

## 2019-10-02 NOTE — TOC Transition Note (Signed)
Transition of Care [facility]) - CM/SW Discharge Note   Patient Details  Name: Elizabeth Gutierrez MRN: [id number] Date of Birth: 1960/12/14  [facility]) CM/SW Contact:  Beverly Sessions, RN Phone Number: 10/02/2019, 4:15 PM   Clinical Narrative:     Patient to discharge today Confirmed O2 has been delivered Again confirmed address with supervisor Elouise Munroe at group home before completing EMS pack  Bedside RN has called EMS for transport, and notified patient's mother who is her guardian   Final next level of care: Group Home Barriers to Discharge: No Barriers Identified   Patient Goals and CMS Choice Patient states their goals for this hospitalization and ongoing recovery are:: Guardian: for pt to return to the facility      Discharge Placement                Patient to be transferred to facility by: EMS Name of family member notified: Blanch Media Patient and family notified of of transfer: 10/01/19  Discharge Plan and Services In-house Referral: Clinical Social Work Discharge Planning Services: CM Consult            DME Arranged: Oxygen DME Agency: [employer] Date DME Agency Contacted: 10/01/19   Representative spoke with at DME Agency: [facility] HH Arranged: PT [place]: [facility] [employer] Date [place]: 10/01/19   Representative spoke with at [place]  Social Determinants of Health ([place]) Interventions     Readmission Risk Interventions No flowsheet data found.

## 2019-10-02 NOTE — Progress Notes (Signed)
Elizabeth Gutierrez called and confirmed oxygen devidery. The patient's mother has been notified about the patient being discharged. EMS has been called. The patient is fourth in the list.

## 2019-10-02 NOTE — Progress Notes (Signed)
[facility] just came to pick patient up. I called [place] location and spoke to someone there. They said they would still accept patient at this hour. IV removed intact. Paperwork and patient's belongings given to EMS staff. I called patient's mother who wanted to know when patient left and also staff at [place] to let them know patient was leaving now.  Mother's # [id number] [place] # [phone]

## 2019-10-02 NOTE — Discharge Summary (Signed)
Physician Discharge Summary  JEYDI KLINGEL DJM:[id number] DOB: 05/14/1960 DOA: 09/26/2019  PCP: Denton Lank, MD  Admit date: 09/26/2019 Discharge date: 10/02/2019  Discharge disposition: Group home   Recommendations for Outpatient Follow-Up:   1. Outpatient follow-up with PCP in 1 week 2. Outpatient follow-up with nephrologist in 2 weeks   Discharge Diagnosis:   Principal Problem:   Bilateral pneumonia Active Problems:   Chronic systolic heart failure (HCC)   CAD in native artery   Controlled type 2 diabetes mellitus without complication (HCC)   Benign essential HTN   Hyperlipidemia   Hypothyroidism   CKD (chronic kidney disease), stage IIIa   Acute on chronic respiratory failure with hypoxia (HCC)   Shortness of breath    Discharge Condition: Stable.  Diet recommendation:  Diet Order            Diet - low sodium heart healthy           Diet Carb Modified                       Code Status: Full Code     Hospital Course:   [name] is a 59 year old woman with past medical history significant for chronic systolic CHF, CAD, type 2 diabetes mellitus, CKD stage IIIa, hyperlipidemia, hypertension, schizophrenia, mental retardation, hypothyroidism.  She presented to the hospital because of increasing shortness of breath and oxygen desaturation in the 70s on room air.  She was admitted to the hospital for bilateral pneumonia and acute hypoxemic respiratory failure.  She was treated with IV fluids, empiric IV antibiotics and oxygen therapy.  She also required IV Lasix for probable acute exacerbation of underlying chronic systolic CHF.  Her condition has improved but she could not be weaned off of oxygen.  Oxygen saturation on room air at rest was 84% and oxygen saturation on room air while ambulating was 81%.  Oxygenation improved to 91% on 2 L/min oxygen via nasal cannula.  She will be discharged to the group on 2 L/min oxygen.  Overall, her condition has  improved and she is deemed stable for discharge today. There was some issue with pt's oxygen delivery at the group home and dischage was held last night.  Pt doing well today.  Discharge Exam:   Vitals:   10/02/19 1141 10/02/19 1154  BP: 101/65 111/67  Pulse: 84 84  Resp: 20 19  Temp: 98.7 F (37.1 C) 99.2 F (37.3 C)  SpO2: 97% 92%   Vitals:   10/02/19 0436 10/02/19 0911 10/02/19 1141 10/02/19 1154  BP: 116/69 101/78 101/65 111/67  Pulse: 73 80 84 84  Resp: 19 16 20 19   Temp: 98.8 F (37.1 C) 98.4 F (36.9 C) 98.7 F (37.1 C) 99.2 F (37.3 C)  TempSrc: Oral   Oral  SpO2: 92% 96% 97% 92%  Weight:      Height:         GEN: NAD SKIN: No rash EYES: EOMI ENT: MMM CV: RRR PULM: Bibasilar rales, no wheezing ABD: soft, ND, NT, +BS CNS: AAO x 3, non focal EXT: No edema or tenderness   The results of significant diagnostics from this hospitalization (including imaging, microbiology, ancillary and laboratory) are listed below for reference.     Procedures and Diagnostic Studies:   ECHOCARDIOGRAM COMPLETE  Result Date: 09/27/2019    ECHOCARDIOGRAM REPORT   Patient Name:   Elizabeth Gutierrez Date of Exam: 09/27/2019 Medical Rec #:  [id number]  Height:       68.0 in Accession #:    [id number]    Weight:       217.4 lb Date of Birth:  10-26-1960     BSA:          2.118 m Patient Age:    7 years      BP:           109/68 mmHg Patient Gender: F             HR:           71 bpm. Exam Location:  [facility] Procedure: 2D Echo, Cardiac Doppler, Color Doppler and Intracardiac            Opacification Agent Indications:     R06.00 Dyspnea  History:         Patient has prior history of Echocardiogram examinations. CHF,                  CAD; Risk Factors:Hypertension and Dyslipidemia.  Sonographer:     Charmayne Sheer RDCS (AE) Referring Phys:  [id number] Nicolette Bang Diagnosing Phys: Ida Rogue MD  Sonographer Comments: Suboptimal parasternal window, suboptimal apical window and no  subcostal window. Image acquisition challenging due to patient body habitus. IMPRESSIONS  1. Left ventricular ejection fraction, by estimation, is 30 to 35%. The left ventricle has moderately decreased function. The left ventricle demonstrates severe global hypokinesis, wall motion of basal regions best perserved, unable to exclude stress cardiomyopathy. Left ventricular diastolic parameters are consistent with Grade I diastolic dysfunction (impaired relaxation).  2. Right ventricular systolic function is normal. The right ventricular size is normal. FINDINGS  Left Ventricle: Left ventricular ejection fraction, by estimation, is 30 to 35%. The left ventricle has moderately decreased function. The left ventricle demonstrates global hypokinesis. Definity contrast agent was given IV to delineate the left ventricular endocardial borders. The left ventricular internal cavity size was normal in size. There is no left ventricular hypertrophy. Left ventricular diastolic parameters are consistent with Grade I diastolic dysfunction (impaired relaxation). Right Ventricle: The right ventricular size is normal. No increase in right ventricular wall thickness. Right ventricular systolic function is normal. Left Atrium: Left atrial size was normal in size. Right Atrium: Right atrial size was normal in size. Pericardium: There is no evidence of pericardial effusion. Mitral Valve: The mitral valve is normal in structure. Normal mobility of the mitral valve leaflets. No evidence of mitral valve regurgitation. No evidence of mitral valve stenosis. MV peak gradient, 2.8 mmHg. The mean mitral valve gradient is 1.0 mmHg. Tricuspid Valve: The tricuspid valve is normal in structure. Tricuspid valve regurgitation is not demonstrated. No evidence of tricuspid stenosis. Aortic Valve: The aortic valve was not well visualized. Aortic valve regurgitation is not visualized. No aortic stenosis is present. Aortic valve mean gradient measures 3.0  mmHg. Aortic valve peak gradient measures 6.0 mmHg. Aortic valve area, by VTI measures 4.01 cm. Pulmonic Valve: The pulmonic valve was normal in structure. Pulmonic valve regurgitation is trivial. No evidence of pulmonic stenosis. Aorta: The aortic root is normal in size and structure. Venous: The inferior vena cava is normal in size with greater than 50% respiratory variability, suggesting right atrial pressure of 3 mmHg. IAS/Shunts: No atrial level shunt detected by color flow Doppler.  LEFT VENTRICLE PLAX 2D LVIDd:         5.55 cm  Diastology LVIDs:         4.01 cm  LV e' lateral:  4.46 cm/s LV PW:         1.18 cm  LV E/e' lateral: 11.2 LV IVS:        0.89 cm  LV e' medial:    8.70 cm/s LVOT diam:     2.40 cm  LV E/e' medial:  5.8 LV SV:         78 LV SV Index:   37 LVOT Area:     4.52 cm  LEFT ATRIUM             Index LA diam:        4.50 cm 2.13 cm/m LA Vol (A2C):   39.3 ml 18.56 ml/m LA Vol (A4C):   36.0 ml 17.00 ml/m LA Biplane Vol: 38.5 ml 18.18 ml/m  AORTIC VALVE                   PULMONIC VALVE AV Area (Vmax):    3.53 cm    PV Vmax:       1.05 m/s AV Area (Vmean):   3.38 cm    PV Vmean:      65.100 cm/s AV Area (VTI):     4.01 cm    PV VTI:        0.155 m AV Vmax:           122.00 cm/s PV Peak grad:  4.4 mmHg AV Vmean:          85.900 cm/s PV Mean grad:  2.0 mmHg AV VTI:            0.195 m AV Peak Grad:      6.0 mmHg AV Mean Grad:      3.0 mmHg LVOT Vmax:         95.30 cm/s LVOT Vmean:        64.200 cm/s LVOT VTI:          0.173 m LVOT/AV VTI ratio: 0.89  AORTA Ao Root diam: 3.50 cm MITRAL VALVE MV Area (PHT): 6.48 cm    SHUNTS MV Peak grad:  2.8 mmHg    Systemic VTI:  0.17 m MV Mean grad:  1.0 mmHg    Systemic Diam: 2.40 cm MV Vmax:       0.83 m/s MV Vmean:      50.1 cm/s MV Decel Time: 117 msec MV E velocity: 50.10 cm/s MV A velocity: 87.40 cm/s MV E/A ratio:  0.57 Ida Rogue MD Electronically signed by Ida Rogue MD Signature Date/Time: 09/27/2019/11:32:23 AM    Final      Labs:    Basic Metabolic Panel: Recent Labs  Lab 09/26/19 1557 09/26/19 1557 09/27/19 0555 09/27/19 0555 09/28/19 7616 09/28/19 0852 09/30/19 0621 10/01/19 0512  NA 137  --  139  --  138  --  137 137  K 3.7   < > 4.0   < > 4.9   < > 4.4 4.3  CL 110  --  102  --  100  --  95* 95*  CO2 21*  --  29  --  30  --  34* 34*  GLUCOSE 281*  --  170*  --  320*  --  237* 224*  BUN 24*  --  24*  --  19  --  26* 22*  CREATININE 1.20*  --  1.36*  --  1.33*  --  1.52* 1.31*  CALCIUM 8.4*  --  10.0  --  9.9  --  9.9 9.9   < > =  values in this interval not displayed.   GFR Estimated Creatinine Clearance: 55.1 mL/min (A) (by C-G formula based on SCr of 1.31 mg/dL (H)). Liver Function Tests: No results for input(s): AST, ALT, ALKPHOS, BILITOT, PROT, ALBUMIN in the last 168 hours. No results for input(s): LIPASE, AMYLASE in the last 168 hours. No results for input(s): AMMONIA in the last 168 hours. Coagulation profile No results for input(s): INR, PROTIME in the last 168 hours.  CBC: Recent Labs  Lab 09/26/19 1557 09/27/19 0555 09/28/19 0852 09/30/19 0621 10/01/19 0512  WBC 16.1* 12.4* 12.1* 12.0* 12.2*  NEUTROABS 13.6*  --  8.7* 8.0* 8.5*  HGB 11.1* 10.2* 10.6* 11.8* 11.6*  HCT 35.3* 33.8* 36.0 38.8 37.7  MCV 85.1 87.1 87.6 85.1 85.7  PLT 351 340 356 443* 457*   Cardiac Enzymes: No results for input(s): CKTOTAL, CKMB, CKMBINDEX, TROPONINI in the last 168 hours. BNP: Invalid input(s): POCBNP CBG: Recent Labs  Lab 10/01/19 1146 10/01/19 1627 10/01/19 2113 10/02/19 0756 10/02/19 1152  GLUCAP 270* 223* 190* 182* 273*   D-Dimer No results for input(s): DDIMER in the last 72 hours. Hgb A1c No results for input(s): HGBA1C in the last 72 hours. Lipid Profile No results for input(s): CHOL, HDL, LDLCALC, TRIG, CHOLHDL, LDLDIRECT in the last 72 hours. Thyroid function studies No results for input(s): TSH, T4TOTAL, T3FREE, THYROIDAB in the last 72 hours.  Invalid input(s):  FREET3 Anemia work up No results for input(s): VITAMINB12, FOLATE, FERRITIN, TIBC, IRON, RETICCTPCT in the last 72 hours. Microbiology Recent Results (from the past 240 hour(s))  SARS Coronavirus 2 by RT PCR (hospital order, performed in [facility] hospital lab) Nasopharyngeal Nasopharyngeal Swab     Status: None   Collection Time: 09/26/19  4:17 PM   Specimen: Nasopharyngeal Swab  Result Value Ref Range Status   SARS Coronavirus 2 NEGATIVE NEGATIVE Final    Comment: (NOTE) SARS-CoV-2 target nucleic acids are NOT DETECTED.  The SARS-CoV-2 RNA is generally detectable in upper and lower respiratory specimens during the acute phase of infection. The lowest concentration of SARS-CoV-2 viral copies this assay can detect is 250 copies / mL. A negative result does not preclude SARS-CoV-2 infection and should not be used as the sole basis for treatment or other patient management decisions.  A negative result may occur with improper specimen collection / handling, submission of specimen other than nasopharyngeal swab, presence of viral mutation(s) within the areas targeted by this assay, and inadequate number of viral copies (<250 copies / mL). A negative result must be combined with clinical observations, patient history, and epidemiological information.  Fact Sheet for Patients:   StrictlyIdeas.no  Fact Sheet for Healthcare Providers: BankingDealers.co.za  This test is not yet approved or  cleared by the [place] FDA and has been authorized for detection and/or diagnosis of SARS-CoV-2 by FDA under an Emergency Use Authorization (EUA).  This EUA will remain in effect (meaning this test can be used) for the duration of the COVID-19 declaration under Section 564(b)(1) of the Act, 21 U.S.C. section 360bbb-3(b)(1), unless the authorization is terminated or revoked sooner.  Performed at [facility], [address]  [place], [place] [address]      Discharge Instructions:   Discharge Instructions    Diet - low sodium heart healthy   Complete by: As directed    Diet Carb Modified   Complete by: As directed    Face-to-face encounter (required for Medicare/Medicaid patients)   Complete by: As directed  I BERNARD AYIKU certify that this patient is under my care and that I, or a nurse practitioner or physician's assistant working with me, had a face-to-face encounter that meets the physician face-to-face encounter requirements with this patient on 10/01/2019. The encounter with the patient was in whole, or in part for the following medical condition(s) which is the primary reason for home health care (List medical condition): Debility, CHF   The encounter with the patient was in whole, or in part, for the following medical condition, which is the primary reason for home health care: Debility, CHF   I certify that, based on my findings, the following services are medically necessary home health services: Physical therapy   Reason for Medically Necessary Home Health Services: Therapy- Personnel officer, Public librarian   My clinical findings support the need for the above services:  Shortness of breath with activity Cognitive impairments, dementia, or mental confusion  that make it unsafe to leave home     Further, I certify that my clinical findings support that this patient is homebound due to: Shortness of Breath with activity   Home Health   Complete by: As directed    To provide the following care/treatments: PT   Increase activity slowly   Complete by: As directed      Allergies as of 10/02/2019   No Known Allergies     Medication List    TAKE these medications   acetaminophen 325 MG tablet Commonly known as: TYLENOL Take 650 mg by mouth every 4 (four) hours as needed for moderate pain, fever or headache.   amoxicillin-clavulanate 875-125 MG tablet Commonly known as:  Augmentin Take 1 tablet by mouth 2 (two) times daily for 2 days.   ARIPiprazole 30 MG tablet Commonly known as: ABILIFY Take 1 tablet (30 mg total) by mouth at bedtime.   aspirin 81 MG tablet Take 81 mg by mouth daily.   atorvastatin 20 MG tablet Commonly known as: LIPITOR Take 20 mg by mouth at bedtime.   benztropine 0.5 MG tablet Commonly known as: COGENTIN Take 0.5 mg by mouth 2 (two) times daily.   carvedilol 3.125 MG tablet Commonly known as: COREG Take 3.125 mg by mouth 2 (two) times daily.   Diabetic Tussin DM 10-100 MG/5ML liquid Generic drug: dextromethorphan-guaiFENesin Take by mouth every 4 (four) hours as needed for cough.   docusate sodium 100 MG capsule Commonly known as: COLACE Take 100 mg by mouth 2 (two) times daily as needed for mild constipation.   fluPHENAZine decanoate 25 MG/ML injection Commonly known as: PROLIXIN Inject 31.25 mg into the muscle every 14 (fourteen) days.   fluticasone 50 MCG/ACT nasal spray Commonly known as: FLONASE Place 2 sprays into both nostrils daily.   furosemide 20 MG tablet Commonly known as: Lasix Take 1 tablet (20 mg total) by mouth daily as needed for fluid or edema.   gabapentin 100 MG capsule Commonly known as: NEURONTIN Take 200 mg by mouth 3 (three) times daily.   levothyroxine 75 MCG tablet Commonly known as: SYNTHROID Take 75 mcg by mouth daily before breakfast.   lithium carbonate 300 MG CR tablet Commonly known as: LITHOBID Take 300 mg by mouth every 12 (twelve) hours.   metFORMIN 500 MG 24 hr tablet Commonly known as: GLUCOPHAGE-XR Take 1,000 mg by mouth daily with breakfast.   MiraLax 17 g packet Generic drug: polyethylene glycol Take 17 g by mouth daily.   naproxen 500 MG tablet Commonly known as: NAPROSYN  Take 500 mg by mouth 2 (two) times daily with a meal.   nystatin cream Commonly known as: MYCOSTATIN Apply 1 application topically 4 (four) times daily.   omeprazole 20 MG  capsule Commonly known as: PRILOSEC Take 20 mg by mouth 2 (two) times daily.   senna 8.6 MG tablet Commonly known as: SENOKOT Take 1 tablet by mouth at bedtime.   sertraline 100 MG tablet Commonly known as: ZOLOFT Take 100 mg by mouth in the morning.   traZODone 50 MG tablet Commonly known as: DESYREL Take 1 tablet (50 mg total) by mouth at bedtime.            Durable Medical Equipment  (From admission, onward)         Start     Ordered   10/01/19 1337  DME Oxygen  Once       Question Answer Comment  Length of Need Lifetime   Mode or (Route) Nasal cannula   Liters per Minute 2   Frequency Continuous (stationary and portable oxygen unit needed)   Oxygen delivery system Gas      10/01/19 1337          Follow-up Information    Lateef, Munsoor, MD. Schedule an appointment as soon as possible for a visit in 1 week.   Specialty: Nephrology Why: left a message for them to call patient Contact information: [place] [address] [phone]        Denton Lank, MD. Go on 10/14/2019.   Specialty: Family Medicine Why: 1:20appointment Contact information: [address]  [address] [phone]                Time coordinating discharge: 35 minutes  Signed:  [facility] 10/02/2019, 1:27 PM

## 2019-10-10 DIAGNOSIS — N1831 Chronic kidney disease, stage 3a: Secondary | ICD-10-CM | POA: Diagnosis present

## 2019-11-22 ENCOUNTER — Inpatient Hospital Stay
Admission: EM | Admit: 2019-11-22 | Discharge: 2019-12-07 | DRG: 177 | Disposition: A | Discharge status: Skilled Nursing Facility | Payer: Medicare Other | Attending: Hospitalist | Admitting: Hospitalist

## 2019-11-22 ENCOUNTER — Emergency Department: Payer: Medicare Other

## 2019-11-22 DIAGNOSIS — Z79890 Hormone replacement therapy (postmenopausal): Secondary | ICD-10-CM

## 2019-11-22 DIAGNOSIS — N179 Acute kidney failure, unspecified: Secondary | ICD-10-CM | POA: Diagnosis present

## 2019-11-22 DIAGNOSIS — E870 Hyperosmolality and hypernatremia: Secondary | ICD-10-CM | POA: Diagnosis not present

## 2019-11-22 DIAGNOSIS — Z6829 Body mass index (BMI) 29.0-29.9, adult: Secondary | ICD-10-CM

## 2019-11-22 DIAGNOSIS — N1832 Chronic kidney disease, stage 3b: Secondary | ICD-10-CM | POA: Diagnosis present

## 2019-11-22 DIAGNOSIS — J9621 Acute and chronic respiratory failure with hypoxia: Secondary | ICD-10-CM | POA: Diagnosis present

## 2019-11-22 DIAGNOSIS — W06XXXA Fall from bed, initial encounter: Secondary | ICD-10-CM | POA: Diagnosis not present

## 2019-11-22 DIAGNOSIS — G620 Drug-induced polyneuropathy: Secondary | ICD-10-CM | POA: Diagnosis present

## 2019-11-22 DIAGNOSIS — R0902 Hypoxemia: Secondary | ICD-10-CM

## 2019-11-22 DIAGNOSIS — N3001 Acute cystitis with hematuria: Secondary | ICD-10-CM | POA: Diagnosis present

## 2019-11-22 DIAGNOSIS — Z515 Encounter for palliative care: Secondary | ICD-10-CM | POA: Diagnosis not present

## 2019-11-22 DIAGNOSIS — Z7189 Other specified counseling: Secondary | ICD-10-CM | POA: Diagnosis not present

## 2019-11-22 DIAGNOSIS — F79 Unspecified intellectual disabilities: Secondary | ICD-10-CM | POA: Diagnosis present

## 2019-11-22 DIAGNOSIS — G9341 Metabolic encephalopathy: Secondary | ICD-10-CM | POA: Diagnosis present

## 2019-11-22 DIAGNOSIS — E440 Moderate protein-calorie malnutrition: Secondary | ICD-10-CM | POA: Insufficient documentation

## 2019-11-22 DIAGNOSIS — E785 Hyperlipidemia, unspecified: Secondary | ICD-10-CM | POA: Diagnosis present

## 2019-11-22 DIAGNOSIS — I5022 Chronic systolic (congestive) heart failure: Secondary | ICD-10-CM | POA: Diagnosis present

## 2019-11-22 DIAGNOSIS — Z87891 Personal history of nicotine dependence: Secondary | ICD-10-CM

## 2019-11-22 DIAGNOSIS — U071 COVID-19: Principal | ICD-10-CM

## 2019-11-22 DIAGNOSIS — Z7984 Long term (current) use of oral hypoglycemic drugs: Secondary | ICD-10-CM

## 2019-11-22 DIAGNOSIS — Z4659 Encounter for fitting and adjustment of other gastrointestinal appliance and device: Secondary | ICD-10-CM

## 2019-11-22 DIAGNOSIS — E039 Hypothyroidism, unspecified: Secondary | ICD-10-CM | POA: Diagnosis present

## 2019-11-22 DIAGNOSIS — I5042 Chronic combined systolic (congestive) and diastolic (congestive) heart failure: Secondary | ICD-10-CM | POA: Diagnosis present

## 2019-11-22 DIAGNOSIS — R531 Weakness: Secondary | ICD-10-CM

## 2019-11-22 DIAGNOSIS — J9601 Acute respiratory failure with hypoxia: Secondary | ICD-10-CM | POA: Diagnosis not present

## 2019-11-22 DIAGNOSIS — I10 Essential (primary) hypertension: Secondary | ICD-10-CM | POA: Diagnosis present

## 2019-11-22 DIAGNOSIS — J1282 Pneumonia due to coronavirus disease 2019: Secondary | ICD-10-CM

## 2019-11-22 DIAGNOSIS — E119 Type 2 diabetes mellitus without complications: Secondary | ICD-10-CM

## 2019-11-22 DIAGNOSIS — E1165 Type 2 diabetes mellitus with hyperglycemia: Secondary | ICD-10-CM | POA: Diagnosis present

## 2019-11-22 DIAGNOSIS — I4891 Unspecified atrial fibrillation: Secondary | ICD-10-CM | POA: Diagnosis not present

## 2019-11-22 DIAGNOSIS — F200 Paranoid schizophrenia: Secondary | ICD-10-CM | POA: Diagnosis present

## 2019-11-22 DIAGNOSIS — E213 Hyperparathyroidism, unspecified: Secondary | ICD-10-CM | POA: Diagnosis not present

## 2019-11-22 DIAGNOSIS — E1169 Type 2 diabetes mellitus with other specified complication: Secondary | ICD-10-CM | POA: Diagnosis present

## 2019-11-22 DIAGNOSIS — R630 Anorexia: Secondary | ICD-10-CM

## 2019-11-22 DIAGNOSIS — Z79899 Other long term (current) drug therapy: Secondary | ICD-10-CM

## 2019-11-22 DIAGNOSIS — I2510 Atherosclerotic heart disease of native coronary artery without angina pectoris: Secondary | ICD-10-CM | POA: Diagnosis present

## 2019-11-22 DIAGNOSIS — B9620 Unspecified Escherichia coli [E. coli] as the cause of diseases classified elsewhere: Secondary | ICD-10-CM | POA: Diagnosis present

## 2019-11-22 DIAGNOSIS — E1122 Type 2 diabetes mellitus with diabetic chronic kidney disease: Secondary | ICD-10-CM | POA: Diagnosis present

## 2019-11-22 DIAGNOSIS — E11649 Type 2 diabetes mellitus with hypoglycemia without coma: Secondary | ICD-10-CM | POA: Diagnosis not present

## 2019-11-22 DIAGNOSIS — R4182 Altered mental status, unspecified: Secondary | ICD-10-CM | POA: Diagnosis present

## 2019-11-22 DIAGNOSIS — R410 Disorientation, unspecified: Secondary | ICD-10-CM | POA: Diagnosis present

## 2019-11-22 DIAGNOSIS — N189 Chronic kidney disease, unspecified: Secondary | ICD-10-CM | POA: Diagnosis not present

## 2019-11-22 DIAGNOSIS — R000 Tachycardia, unspecified: Secondary | ICD-10-CM | POA: Diagnosis not present

## 2019-11-22 DIAGNOSIS — I130 Hypertensive heart and chronic kidney disease with heart failure and stage 1 through stage 4 chronic kidney disease, or unspecified chronic kidney disease: Secondary | ICD-10-CM | POA: Diagnosis present

## 2019-11-22 DIAGNOSIS — T380X5A Adverse effect of glucocorticoids and synthetic analogues, initial encounter: Secondary | ICD-10-CM | POA: Diagnosis present

## 2019-11-22 DIAGNOSIS — R5383 Other fatigue: Secondary | ICD-10-CM

## 2019-11-22 DIAGNOSIS — J9600 Acute respiratory failure, unspecified whether with hypoxia or hypercapnia: Secondary | ICD-10-CM | POA: Diagnosis not present

## 2019-11-22 DIAGNOSIS — Z7982 Long term (current) use of aspirin: Secondary | ICD-10-CM

## 2019-11-22 LAB — CBC
HCT: 36.8 % (ref 36.0–46.0)
Hemoglobin: 10.7 g/dL — ABNORMAL LOW (ref 12.0–15.0)
MCH: 26.2 pg (ref 26.0–34.0)
MCHC: 29.1 g/dL — ABNORMAL LOW (ref 30.0–36.0)
MCV: 90 fL (ref 80.0–100.0)
Platelets: 219 10*3/uL (ref 150–400)
RBC: 4.09 MIL/uL (ref 3.87–5.11)
RDW: 16.4 % — ABNORMAL HIGH (ref 11.5–15.5)
WBC: 7.3 10*3/uL (ref 4.0–10.5)
nRBC: 0 % (ref 0.0–0.2)

## 2019-11-22 LAB — URINALYSIS, COMPLETE (UACMP) WITH MICROSCOPIC
Bilirubin Urine: NEGATIVE
Glucose, UA: >=500 mg/dL — AB
Ketones, ur: NEGATIVE mg/dL
Nitrite: POSITIVE — AB
Protein, ur: 30 mg/dL — AB
Specific Gravity, Urine: 1.009 (ref 1.005–1.030)
WBC, UA: >50 WBC/hpf — ABNORMAL HIGH (ref 0–5)
pH: 6 (ref 5.0–8.0)

## 2019-11-22 LAB — COMPREHENSIVE METABOLIC PANEL
ALT: 14 U/L (ref 0–44)
AST: 21 U/L (ref 15–41)
Albumin: 3.2 g/dL — ABNORMAL LOW (ref 3.5–5.0)
Alkaline Phosphatase: 68 U/L (ref 38–126)
Anion gap: 12 (ref 5–15)
BUN: 33 mg/dL — ABNORMAL HIGH (ref 6–20)
CO2: 19 mmol/L — ABNORMAL LOW (ref 22–32)
Calcium: 9.7 mg/dL (ref 8.9–10.3)
Chloride: 109 mmol/L (ref 98–111)
Creatinine, Ser: 1.95 mg/dL — ABNORMAL HIGH (ref 0.44–1.00)
GFR calc Af Amer: 32 mL/min — ABNORMAL LOW (ref >60)
GFR calc non Af Amer: 27 mL/min — ABNORMAL LOW (ref >60)
Glucose, Bld: 137 mg/dL — ABNORMAL HIGH (ref 70–99)
Potassium: 3.8 mmol/L (ref 3.5–5.1)
Sodium: 140 mmol/L (ref 135–145)
Total Bilirubin: 0.9 mg/dL (ref 0.3–1.2)
Total Protein: 6.9 g/dL (ref 6.5–8.1)

## 2019-11-22 LAB — CBC WITH DIFFERENTIAL/PLATELET
Abs Immature Granulocytes: 0.05 10*3/uL (ref 0.00–0.07)
Basophils Absolute: 0 10*3/uL (ref 0.0–0.1)
Basophils Relative: 0 %
Eosinophils Absolute: 0 10*3/uL (ref 0.0–0.5)
Eosinophils Relative: 0 %
HCT: 36.6 % (ref 36.0–46.0)
Hemoglobin: 10.9 g/dL — ABNORMAL LOW (ref 12.0–15.0)
Immature Granulocytes: 1 %
Lymphocytes Relative: 12 %
Lymphs Abs: 0.9 10*3/uL (ref 0.7–4.0)
MCH: 26.5 pg (ref 26.0–34.0)
MCHC: 29.8 g/dL — ABNORMAL LOW (ref 30.0–36.0)
MCV: 88.8 fL (ref 80.0–100.0)
Monocytes Absolute: 0.5 10*3/uL (ref 0.1–1.0)
Monocytes Relative: 7 %
Neutro Abs: 5.7 10*3/uL (ref 1.7–7.7)
Neutrophils Relative %: 80 %
Platelets: 216 10*3/uL (ref 150–400)
RBC: 4.12 MIL/uL (ref 3.87–5.11)
RDW: 16.4 % — ABNORMAL HIGH (ref 11.5–15.5)
WBC: 7.1 10*3/uL (ref 4.0–10.5)
nRBC: 0 % (ref 0.0–0.2)

## 2019-11-22 LAB — CREATININE, SERUM
Creatinine, Ser: 2.04 mg/dL — ABNORMAL HIGH (ref 0.44–1.00)
GFR calc Af Amer: 30 mL/min — ABNORMAL LOW (ref >60)
GFR calc non Af Amer: 26 mL/min — ABNORMAL LOW (ref >60)

## 2019-11-22 LAB — TROPONIN I (HIGH SENSITIVITY): Troponin I (High Sensitivity): 19 ng/L — ABNORMAL HIGH (ref <18)

## 2019-11-22 LAB — PROCALCITONIN: Procalcitonin: 0.95 ng/mL

## 2019-11-22 LAB — LITHIUM LEVEL: Lithium Lvl: 1.16 mmol/L (ref 0.60–1.20)

## 2019-11-22 LAB — FIBRINOGEN: Fibrinogen: 499 mg/dL — ABNORMAL HIGH (ref 210–475)

## 2019-11-22 LAB — LACTATE DEHYDROGENASE: LDH: 112 U/L (ref 98–192)

## 2019-11-22 LAB — FERRITIN: Ferritin: 68 ng/mL (ref 11–307)

## 2019-11-22 LAB — LACTIC ACID, PLASMA: Lactic Acid, Venous: 0.9 mmol/L (ref 0.5–1.9)

## 2019-11-22 LAB — BRAIN NATRIURETIC PEPTIDE: B Natriuretic Peptide: 98.8 pg/mL (ref 0.0–100.0)

## 2019-11-22 LAB — MAGNESIUM: Magnesium: 2.3 mg/dL (ref 1.7–2.4)

## 2019-11-22 LAB — SARS CORONAVIRUS 2 BY RT PCR (HOSPITAL ORDER, PERFORMED IN ~~LOC~~ HOSPITAL LAB): SARS Coronavirus 2: POSITIVE — AB

## 2019-11-22 MED ORDER — ENOXAPARIN SODIUM 40 MG/0.4ML ~~LOC~~ SOLN
40.0000 mg | SUBCUTANEOUS | Status: DC
  Administered 2019-11-23 – 2019-11-26 (×4): 40 mg via SUBCUTANEOUS
  Filled 2019-11-22 (×4): qty 0.4

## 2019-11-22 MED ORDER — POLYETHYLENE GLYCOL 3350 17 G PO PACK
17.0000 g | PACK | Freq: Every day | ORAL | Status: DC
  Administered 2019-11-24 – 2019-12-02 (×6): 17 g via ORAL
  Filled 2019-11-22 (×9): qty 1

## 2019-11-22 MED ORDER — BENZTROPINE MESYLATE 0.5 MG PO TABS
0.5000 mg | ORAL_TABLET | Freq: Two times a day (BID) | ORAL | Status: DC
  Administered 2019-11-23 – 2019-11-25 (×6): 0.5 mg via ORAL
  Filled 2019-11-22 (×15): qty 1

## 2019-11-22 MED ORDER — ACETAMINOPHEN 325 MG PO TABS: 650.0000 mg | ORAL_TABLET | Freq: Four times a day (QID) | ORAL | Status: DC | PRN

## 2019-11-22 MED ORDER — TRAZODONE HCL 50 MG PO TABS
50.0000 mg | ORAL_TABLET | Freq: Every day | ORAL | Status: DC
  Administered 2019-11-23: 23:00:00 50 mg via ORAL
  Filled 2019-11-22: qty 1

## 2019-11-22 MED ORDER — GUAIFENESIN-DM 100-10 MG/5ML PO SYRP
10.0000 mL | ORAL_SOLUTION | ORAL | Status: DC | PRN
  Filled 2019-11-22: qty 10

## 2019-11-22 MED ORDER — HYDROCOD POLST-CPM POLST ER 10-8 MG/5ML PO SUER: 5.0000 mL | Freq: Two times a day (BID) | ORAL | Status: DC | PRN

## 2019-11-22 MED ORDER — DEXAMETHASONE SODIUM PHOSPHATE 10 MG/ML IJ SOLN
6.0000 mg | INTRAMUSCULAR | Status: DC
  Administered 2019-11-23 – 2019-11-24 (×2): 6 mg via INTRAVENOUS
  Filled 2019-11-22 (×2): qty 1

## 2019-11-22 MED ORDER — ATORVASTATIN CALCIUM 20 MG PO TABS
20.0000 mg | ORAL_TABLET | Freq: Every day | ORAL | Status: DC
  Administered 2019-11-23 – 2019-11-25 (×3): 20 mg via ORAL
  Filled 2019-11-22 (×2): qty 1

## 2019-11-22 MED ORDER — PANTOPRAZOLE SODIUM 40 MG PO TBEC
40.0000 mg | DELAYED_RELEASE_TABLET | Freq: Every day | ORAL | Status: DC
  Administered 2019-11-23 – 2019-11-25 (×3): 40 mg via ORAL
  Filled 2019-11-22 (×4): qty 1

## 2019-11-22 MED ORDER — ASPIRIN 81 MG PO CHEW
81.0000 mg | CHEWABLE_TABLET | Freq: Every day | ORAL | Status: DC
  Administered 2019-11-23 – 2019-11-25 (×3): 81 mg via ORAL
  Filled 2019-11-22 (×6): qty 1

## 2019-11-22 MED ORDER — SENNA 8.6 MG PO TABS
1.0000 | ORAL_TABLET | Freq: Every day | ORAL | Status: DC
  Administered 2019-11-24 – 2019-11-25 (×2): 8.6 mg via ORAL
  Filled 2019-11-22 (×3): qty 1

## 2019-11-22 MED ORDER — SODIUM CHLORIDE 0.9 % IV SOLN
200.0000 mg | Freq: Once | INTRAVENOUS | Status: AC
  Administered 2019-11-23: 200 mg via INTRAVENOUS
  Filled 2019-11-22: qty 200

## 2019-11-22 MED ORDER — ARIPIPRAZOLE 15 MG PO TABS
30.0000 mg | ORAL_TABLET | Freq: Every day | ORAL | Status: DC
  Administered 2019-11-23 – 2019-11-25 (×3): 30 mg via ORAL
  Filled 2019-11-22 (×10): qty 2

## 2019-11-22 MED ORDER — ASCORBIC ACID 500 MG PO TABS
500.0000 mg | ORAL_TABLET | Freq: Every day | ORAL | Status: DC
  Administered 2019-11-22 – 2019-11-25 (×4): 500 mg via ORAL
  Filled 2019-11-22 (×7): qty 1

## 2019-11-22 MED ORDER — SERTRALINE HCL 50 MG PO TABS
100.0000 mg | ORAL_TABLET | Freq: Every morning | ORAL | Status: DC
  Administered 2019-11-23 – 2019-11-25 (×3): 100 mg via ORAL
  Filled 2019-11-22 (×5): qty 2

## 2019-11-22 MED ORDER — TRAMADOL HCL 50 MG PO TABS: 50.0000 mg | ORAL_TABLET | Freq: Four times a day (QID) | ORAL | Status: DC | PRN

## 2019-11-22 MED ORDER — FLUTICASONE PROPIONATE 50 MCG/ACT NA SUSP
2.0000 | Freq: Every day | NASAL | Status: DC
  Administered 2019-11-23 – 2019-12-07 (×13): 2 via NASAL
  Filled 2019-11-22 (×2): qty 16

## 2019-11-22 MED ORDER — SODIUM CHLORIDE 0.9 % IV SOLN
100.0000 mg | Freq: Every day | INTRAVENOUS | Status: AC
  Administered 2019-11-23 – 2019-11-26 (×4): 100 mg via INTRAVENOUS
  Filled 2019-11-22 (×5): qty 20

## 2019-11-22 MED ORDER — LEVOTHYROXINE SODIUM 50 MCG PO TABS
75.0000 ug | ORAL_TABLET | Freq: Every day | ORAL | Status: DC
  Administered 2019-11-23 – 2019-11-26 (×4): 75 ug via ORAL
  Filled 2019-11-22: qty 2
  Filled 2019-11-22 (×3): qty 1

## 2019-11-22 MED ORDER — DOCUSATE SODIUM 100 MG PO CAPS: 100.0000 mg | ORAL_CAPSULE | Freq: Two times a day (BID) | ORAL | Status: DC | PRN

## 2019-11-22 MED ORDER — ZINC SULFATE 220 (50 ZN) MG PO CAPS
220.0000 mg | ORAL_CAPSULE | Freq: Every day | ORAL | Status: DC
  Administered 2019-11-22 – 2019-12-02 (×8): 220 mg via ORAL
  Filled 2019-11-22 (×11): qty 1

## 2019-11-22 MED ORDER — LACTATED RINGERS IV BOLUS
500.0000 mL | Freq: Once | INTRAVENOUS | Status: AC
  Administered 2019-11-22: 500 mL via INTRAVENOUS

## 2019-11-22 MED ORDER — LITHIUM CARBONATE ER 300 MG PO TBCR
300.0000 mg | EXTENDED_RELEASE_TABLET | Freq: Two times a day (BID) | ORAL | Status: DC
  Administered 2019-11-23 – 2019-11-25 (×6): 300 mg via ORAL
  Filled 2019-11-22 (×16): qty 1

## 2019-11-22 MED ORDER — GABAPENTIN 100 MG PO CAPS
200.0000 mg | ORAL_CAPSULE | Freq: Three times a day (TID) | ORAL | Status: DC
  Administered 2019-11-23 – 2019-11-24 (×3): 200 mg via ORAL
  Filled 2019-11-22 (×7): qty 2

## 2019-11-22 MED ORDER — DEXTROMETHORPHAN-GUAIFENESIN 10-100 MG/5ML PO LIQD
5.0000 mL | ORAL | Status: DC | PRN
  Filled 2019-11-22: qty 5

## 2019-11-22 MED ORDER — DEXAMETHASONE SODIUM PHOSPHATE 10 MG/ML IJ SOLN
6.0000 mg | Freq: Once | INTRAMUSCULAR | Status: AC
  Administered 2019-11-22: 6 mg via INTRAVENOUS
  Filled 2019-11-22: qty 1

## 2019-11-22 NOTE — Progress Notes (Signed)
Remdesivir - Pharmacy Brief Note   O:  ALT: 14  CXR:  SpO2: 83 % on RA   A/P:  Remdesivir 200 mg IVPB once followed by 100 mg IVPB daily x 4 days.   Amer Alcindor D 11/22/2019 9:20 PM

## 2019-11-22 NOTE — ED Triage Notes (Signed)
Pt comes via [facility] from [facility] where she has reported experienced altered mental status for the last day. EMS arrived to home and pt was hypoxic at 83% RA. EMS placed pt on 6LPM [place].   On arrival pt shows cough and diminished lung sounds in lower lobes. Pt experienced RA sats of 77% and returned to 6LPM at this time.

## 2019-11-22 NOTE — H&P (Signed)
Elizabeth Gutierrez is an 58 y.o. female.    From : Group Home  Chief Complaint: Altered Mental Status, Hypoxia  HPI: The patient is a 59 yr old cognitively impaired woman who lives in a group home. She was diagnosed with COVID 19 on 11/10/2019. Today she was found to have altered mental status. She was saturating 77% on room air.   She has a past medical history significant for CHF (EF 30-35% from echo 09/27/2019), CAD (Hx MI), DM II, Hypertension, Hyperlipidemia, Paranoid Schizophrenia, Thyroid disease.  In the ED she was found to saturate 93% on room air. She was transiently hypotensive with blood pressures in the 90's. Temperature was 99.8. CXR has demonstrated patchy heterogeneous bilateral airspace opacities in the mid-lower lung zone predominant distribution with mild vascular congestion. This is consistent with COVID-19 pneumonia.  ROS is unobtainable due to the patient's acute illness and altered mental status.  [facility] were consulted to admit the patient for further evaluation and treatment. Past Medical History:  Diagnosis Date  . CHF (congestive heart failure) ([place])   . Coronary artery disease   . Diabetes ([place])   . Heart attack ([place])   . Hyperlipidemia   . Hypertension   . MR (mental retardation)    Mild  . Paranoid schizophrenia ([place])   . Thyroid disease     Past Surgical History:  Procedure Laterality Date  . FOOT SURGERY Right     Family History  Problem Relation Age of Onset  . Alcohol abuse Father   . Breast cancer Neg Hx    Social History:  reports that she quit smoking about 6 years ago. She has quit using smokeless tobacco. She reports that she does not drink alcohol and does not use drugs. (Not in a hospital admission)   Allergies: No Known Allergies  Review of systems not obtained due to patient factors.  General appearance: alert, cooperative and mild distress Head: Normocephalic, without obvious abnormality, atraumatic Eyes:  conjunctivae/corneas clear. PERRL, EOM's intact. Fundi benign. Throat: lips, mucosa, and tongue normal; teeth and gums normal Neck: no adenopathy, no carotid bruit, no JVD, supple, symmetrical, trachea midline and thyroid not enlarged, symmetric, no tenderness/mass/nodules Resp: Positive for tachypnea, scattered rales throughout, no wheezes, or rhonchi. No tactile fremitus. Chest wall: no tenderness Cardio: regular rate and rhythm, S1, S2 normal, no murmur, click, rub or gallop GI: soft, non-tender; bowel sounds normal; no masses,  no organomegaly Extremities: extremities normal, atraumatic, no cyanosis or edema Pulses: 2+ and symmetric Skin: Skin color, texture, turgor normal. No rashes or lesions Lymph nodes: Cervical, supraclavicular, and axillary nodes normal. Neurologic: Alert and oriented X 3, normal strength and tone. Normal symmetric reflexes. Normal coordination and gait  Results for orders placed or performed during the hospital encounter of 11/22/19 (from the past 48 hour(s))  CBC with Differential     Status: Abnormal   Collection Time: 11/22/19  7:45 PM  Result Value Ref Range   WBC 7.1 4.0 - 10.5 K/uL   RBC 4.12 3.87 - 5.11 MIL/uL   Hemoglobin 10.9 (L) 12.0 - 15.0 g/dL   HCT 36.6 36 - 46 %   MCV 88.8 80.0 - 100.0 fL   MCH 26.5 26.0 - 34.0 pg   MCHC 29.8 (L) 30.0 - 36.0 g/dL   RDW 16.4 (H) 11.5 - 15.5 %   Platelets 216 150 - 400 K/uL   nRBC 0.0 0.0 - 0.2 %   Neutrophils Relative % 80 %   Neutro Abs  5.7 1.7 - 7.7 K/uL   Lymphocytes Relative 12 %   Lymphs Abs 0.9 0.7 - 4.0 K/uL   Monocytes Relative 7 %   Monocytes Absolute 0.5 0 - 1 K/uL   Eosinophils Relative 0 %   Eosinophils Absolute 0.0 0 - 0 K/uL   Basophils Relative 0 %   Basophils Absolute 0.0 0 - 0 K/uL   Immature Granulocytes 1 %   Abs Immature Granulocytes 0.05 0.00 - 0.07 K/uL    Comment: Performed at [facility], [address] [place], [place] [address]  Comprehensive metabolic panel      Status: Abnormal   Collection Time: 11/22/19  7:45 PM  Result Value Ref Range   Sodium 140 135 - 145 mmol/L   Potassium 3.8 3.5 - 5.1 mmol/L   Chloride 109 98 - 111 mmol/L   CO2 19 (L) 22 - 32 mmol/L   Glucose, Bld 137 (H) 70 - 99 mg/dL    Comment: Glucose reference range applies only to samples taken after fasting for at least 8 hours.   BUN 33 (H) 6 - 20 mg/dL   Creatinine, Ser 1.95 (H) 0.44 - 1.00 mg/dL   Calcium 9.7 8.9 - 10.3 mg/dL   Total Protein 6.9 6.5 - 8.1 g/dL   Albumin 3.2 (L) 3.5 - 5.0 g/dL   AST 21 15 - 41 U/L   ALT 14 0 - 44 U/L   Alkaline Phosphatase 68 38 - 126 U/L   Total Bilirubin 0.9 0.3 - 1.2 mg/dL   GFR calc non Af Amer 27 (L) >60 mL/min   GFR calc Af Amer 32 (L) >60 mL/min   Anion gap 12 5 - 15    Comment: Performed at [facility], [place], [place] [address]  Troponin I (High Sensitivity)     Status: Abnormal   Collection Time: 11/22/19  7:45 PM  Result Value Ref Range   Troponin I (High Sensitivity) 19 (H) <18 ng/L    Comment: (NOTE) Elevated high sensitivity troponin I (hsTnI) values and significant  changes across serial measurements may suggest ACS but many other  chronic and acute conditions are known to elevate hsTnI results.  Refer to the "Links" section for chest pain algorithms and additional  guidance. Performed at [facility], [address] [place], [place] [address]   Lactic acid, plasma     Status: None   Collection Time: 11/22/19  7:45 PM  Result Value Ref Range   Lactic Acid, Venous 0.9 0.5 - 1.9 mmol/L    Comment: Performed at [facility], [address] [place], [place] [address]  Lithium level     Status: None   Collection Time: 11/22/19  7:45 PM  Result Value Ref Range   Lithium Lvl 1.16 0.60 - 1.20 mmol/L    Comment: Performed at [facility], [address] [place], [place] [address]  Brain natriuretic peptide     Status: None   Collection Time: 11/22/19  7:45 PM   Result Value Ref Range   B Natriuretic Peptide 98.8 0.0 - 100.0 pg/mL    Comment: Performed at [facility], [address] [place], [place] [address]  SARS Coronavirus 2 by RT PCR (hospital order, performed in [place] [facility] hospital lab) Nasopharyngeal Nasopharyngeal Swab     Status: Abnormal   Collection Time: 11/22/19  7:45 PM   Specimen: Nasopharyngeal Swab  Result Value Ref Range   SARS Coronavirus 2 POSITIVE (A) NEGATIVE  Comment: RESULT CALLED TO, READ BACK BY AND VERIFIED WITH: CHRIS BUCKNER 11/22/19 AT 2048 HS (NOTE) SARS-CoV-2 target nucleic acids are DETECTED  SARS-CoV-2 RNA is generally detectable in upper respiratory specimens  during the acute phase of infection.  Positive results are indicative  of the presence of the identified virus, but do not rule out bacterial infection or co-infection with other pathogens not detected by the test.  Clinical correlation with patient history and  other diagnostic information is necessary to determine patient infection status.  The expected result is negative.  Fact Sheet for Patients:   StrictlyIdeas.no   Fact Sheet for Healthcare Providers:   BankingDealers.co.za    This test is not yet approved or cleared by the [place] FDA and  has been authorized for detection and/or diagnosis of SARS-CoV-2 by FDA under an Emergency Use Authorization (EUA).  This EUA will remain in effect (meaning this test  can be used) for the duration of  the COVID-19 declaration under Section 564(b)(1) of the Act, 21 U.S.C. section 360-bbb-3(b)(1), unless the authorization is terminated or revoked sooner.  Performed at [facility], [address] [place], [place] [address]   Magnesium     Status: None   Collection Time: 11/22/19  7:45 PM  Result Value Ref Range   Magnesium 2.3 1.7 - 2.4 mg/dL    Comment: Performed at [facility], [address] [place],  [place] [address]   @[email or web address] @  Blood pressure (!) 106/58, pulse 77, temperature 99.8 F (37.7 C), temperature source Oral, resp. rate 18, height 5\' 3"  (1.6 m), weight 74.8 kg, SpO2 93 %.   Assessment/Plan Problem  Acute Respiratory Failure With Hypoxia (Hcc)  Pneumonia Due to Covid-19 Virus  Paranoid Schizophrenia (Hcc)  Hypothyroidism  Altered Mental State  Cad in Native Artery  Controlled Type 2 Diabetes Mellitus Without Complication (Hcc)  Benign Essential Htn  Chronic Systolic Heart Failure (Hcc)   Acute hypoxic respiratory failure: The patient is currently saturating at 94% on 6L O2. Monitor closely.  Pneumonia Due to COVID-19 Virus: The patient was diagnosed with COVID-19 on 11/10/2019. She has been started on IV steroids and Remdesevir. Inflammatory markers are pending.   Paranoid Schizophrenia: On lithium. Lithium level is within normal limits. Will continue the patient on her Lithium, Abilify, Zoloft, Desyrel, and cogentin.   Hypothyroidism: Continue synthroid as at home.  Altered mental status: Likely due to acute illness and hypoxia. Monitor.  CAD In Native Artery: Continue ASA, Lipitor. Coreg held due to low blood pressures.  DVT prophylaxis: Lovenox CODE STATUS: Full Code Family Communication: None available Disposition: The patient is being admitted to a progressive bed as an inpatient.  Aveleen Nevers 11/22/2019, 10:25 PM

## 2019-11-22 NOTE — ED Provider Notes (Signed)
[facility] Emergency Department Provider Note ____________________________________________   First MD Initiated Contact with Patient 11/22/19 1922     (approximate)  I have reviewed the triage vital signs and the nursing notes.  HISTORY  Chief Complaint Altered Mental Status and Covid Positive   HPI Elizabeth Gutierrez is a 59 y.o. femalewho presents to the ED for evaluation of shortness of breath, hypoxia and altered mental status.  Chart review indicates ejection fraction of 30%. History of mental retardation.. Schizophrenia on lithium. HTN, HLD, DM and CAD.  Patient lives in a local group home not on supplemental oxygen. EMS reports being called for altered mental status initially, finding the patient with oxygen saturations of 83% on room air and requiring 6 L nasal cannula for normoxia. EMS reports the group home test the patient for COVID-19, returning positive for days ago.  Patient is unable to provide any significant history due to her MR and altered mental status. She is pleasant and without complaints.    Past Medical History:  Diagnosis Date  . CHF (congestive heart failure) ([place])   . Coronary artery disease   . Diabetes ([place])   . Heart attack ([place])   . Hyperlipidemia   . Hypertension   . MR (mental retardation)    Mild  . Paranoid schizophrenia ([place])   . Thyroid disease     Patient Active Problem List   Diagnosis Date Noted  . Bilateral pneumonia 09/28/2019  . Shortness of breath 09/26/2019  . Hyperlipidemia   . Paranoid schizophrenia ([place])   . Type II diabetes mellitus with renal manifestations ([place])   . Hypothyroidism   . CKD (chronic kidney disease), stage IIIa   . Acute on chronic respiratory failure with hypoxia ([place])   . Acute metabolic encephalopathy 70/35/0093  . Altered mental state 08/17/2016  . DDD (degenerative disc disease), lumbar 07/09/2015  . Facet syndrome, lumbar 07/09/2015  . Lumbar radiculopathy 07/09/2015  .  Sacroiliac joint dysfunction 07/09/2015  . Spinal stenosis, lumbar region, 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  . Diabetic neuropathy ([place]) 07/09/2015  . CAD in native artery 06/16/2015  . Controlled type 2 diabetes mellitus without complication ([place]) 81/82/9937  . Benign essential HTN 06/16/2015  . Obstructive apnea 01/05/2015  . Esophagitis, reflux 08/06/2014  . Edema leg 05/14/2014  . Chronic systolic heart failure ([place]) 05/14/2014  . TI (tricuspid incompetence) 05/14/2014  . Chest pain 12/13/2013  . Combined fat and carbohydrate induced hyperlipemia 10/09/2013  . Breath shortness 10/09/2013    Past Surgical History:  Procedure Laterality Date  . FOOT SURGERY Right     Prior to Admission medications   Medication Sig Start Date End Date Taking? Authorizing Provider  ARIPiprazole (ABILIFY) 30 MG tablet Take 1 tablet (30 mg total) by mouth at bedtime. 09/21/19  Yes Hongalgi, Lenis Dickinson, MD  aspirin 81 MG tablet Take 81 mg by mouth daily.   Yes [provider]  atorvastatin (LIPITOR) 20 MG tablet Take 20 mg by mouth at bedtime.    Yes [provider]  benztropine (COGENTIN) 0.5 MG tablet Take 0.5 mg by mouth 2 (two) times daily.   Yes [provider]  carvedilol (COREG) 3.125 MG tablet Take 3.125 mg by mouth 2 (two) times daily. 12/26/18  Yes [provider]  docusate sodium (COLACE) 100 MG capsule Take 100 mg by mouth 2 (two) times daily as needed for mild constipation.    Yes [provider]  fluPHENAZine decanoate (PROLIXIN) 25 MG/ML injection Inject  31.25 mg into the muscle every 14 (fourteen) days.    Yes [provider]  fluticasone (FLONASE) 50 MCG/ACT nasal spray Place 2 sprays into both nostrils daily.    Yes [provider]  furosemide (LASIX) 20 MG tablet Take 1 tablet (20 mg total) by mouth daily as needed for fluid or edema. 10/01/19 09/30/20 Yes Jennye Boroughs, MD  gabapentin (NEURONTIN) 100 MG capsule Take 200 mg  by mouth 3 (three) times daily.   Yes [provider]  levothyroxine (SYNTHROID, LEVOTHROID) 75 MCG tablet Take 75 mcg by mouth daily before breakfast.   Yes [provider]  lithium carbonate (LITHOBID) 300 MG CR tablet Take 300 mg by mouth every 12 (twelve) hours.    Yes [provider]  metFORMIN (GLUCOPHAGE-XR) 500 MG 24 hr tablet Take 1,000 mg by mouth daily with breakfast.   Yes [provider]  naproxen (NAPROSYN) 500 MG tablet Take 500 mg by mouth 2 (two) times daily with a meal.   Yes [provider]  nystatin cream (MYCOSTATIN) Apply 1 application topically 4 (four) times daily.    Yes [provider]  omeprazole (PRILOSEC) 20 MG capsule Take 20 mg by mouth 2 (two) times daily.  12/26/18  Yes [provider]  senna (SENOKOT) 8.6 MG tablet Take 1 tablet by mouth at bedtime.    Yes [provider]  sertraline (ZOLOFT) 100 MG tablet Take 100 mg by mouth in the morning.    Yes [provider]  traZODone (DESYREL) 50 MG tablet Take 1 tablet (50 mg total) by mouth at bedtime. 08/19/16  Yes Wieting, Richard, MD  acetaminophen (TYLENOL) 325 MG tablet Take 650 mg by mouth every 4 (four) hours as needed for moderate pain, fever or headache.     [provider]  dextromethorphan-guaiFENesin (DIABETIC TUSSIN DM) 10-100 MG/5ML liquid Take by mouth every 4 (four) hours as needed for cough.    [provider]  polyethylene glycol (MIRALAX) 17 g packet Take 17 g by mouth daily.     [provider]    Allergies Patient has no known allergies.  Family History  Problem Relation Age of Onset  . Alcohol abuse Father   . Breast cancer Neg Hx     Social History Social History   Tobacco Use  . Smoking status: Former Smoker    Quit date: 07/08/2013    Years since quitting: 6.3  . Smokeless tobacco: Former Network engineer Use Topics  . Alcohol use: No    Alcohol/week: 0.0 standard drinks  . Drug  use: No    Review of Systems  Unable to be accurately obtained due to patient's altered mental status and MR. ____________________________________________   PHYSICAL EXAM:  VITAL SIGNS: Vitals:   11/22/19 2049 11/22/19 2100  BP: (!) 91/49 (!) 94/47  Pulse: 74 75  Resp: 20 19  Temp:    SpO2: 93% 92%      Constitutional: Slumped over on the stretcher with nasal cannula in place. Arouses with loud voice and seems to recognize the paramedic. She follows commands in all 4 extremities and is pleasantly disoriented. Eyes: Conjunctivae are normal. PERRL. EOMI. Head: Atraumatic. Nose: No congestion/rhinnorhea. Mouth/Throat: Mucous membranes are dry.  Oropharynx non-erythematous. Neck: No stridor. No cervical spine tenderness to palpation. Cardiovascular: Normal rate, regular rhythm. Grossly normal heart sounds.  Good peripheral circulation. Respiratory:  No retractions. Tachypneic with low 20s. Bibasilar crackles are present, otherwise clear to auscultation. Gastrointestinal: Soft , nontender  to palpation. No abdominal bruits. No CVA tenderness. Musculoskeletal: No lower extremity tenderness nor edema.  No joint effusions. No signs of acute trauma. Neurologic:  No gross focal neurologic deficits are appreciated.  Skin:  Skin is warm, dry and intact. No rash noted. Psychiatric: Mood and affect are normal. Speech and behavior are normal.  ____________________________________________   LABS (all labs ordered are listed, but only abnormal results are displayed)  Labs Reviewed  SARS CORONAVIRUS 2 BY RT PCR (HOSPITAL ORDER, [facility]) - Abnormal; Notable for the following components:      Result Value   SARS Coronavirus 2 POSITIVE (*)    All other components within normal limits  CBC WITH DIFFERENTIAL/PLATELET - Abnormal; Notable for the following components:   Hemoglobin 10.9 (*)    MCHC 29.8 (*)    RDW 16.4 (*)    All other components within normal  limits  COMPREHENSIVE METABOLIC PANEL - Abnormal; Notable for the following components:   CO2 19 (*)    Glucose, Bld 137 (*)    BUN 33 (*)    Creatinine, Ser 1.95 (*)    Albumin 3.2 (*)    GFR calc non Af Amer 27 (*)    GFR calc Af Amer 32 (*)    All other components within normal limits  TROPONIN I (HIGH SENSITIVITY) - Abnormal; Notable for the following components:   Troponin I (High Sensitivity) 19 (*)    All other components within normal limits  LACTIC ACID, PLASMA  LITHIUM LEVEL  BRAIN NATRIURETIC PEPTIDE  MAGNESIUM  URINALYSIS, COMPLETE (UACMP) WITH MICROSCOPIC   ____________________________________________  12 Lead EKG  Sinus rhythm, rate of 83 bpm, normal axis. Right bundle branch block morphology is present, similar to previous EKG in our system. Intervals are otherwise normal. Chronic T wave inversions anteriorly without evidence of acute ischemia. ____________________________________________  RADIOLOGY  ED MD interpretation: CXR patchy multifocal opacities consistent with COVID-19 without significant pulmonary vascular congestion.  CT head without evidence of acute cranial pathology.  Official radiology report(s): CT Head Wo Contrast  Result Date: 11/22/2019 CLINICAL DATA:  Altered mental status.  Hypoxia. EXAM: CT HEAD WITHOUT CONTRAST TECHNIQUE: Contiguous axial images were obtained from the base of the skull through the vertex without intravenous contrast. COMPARISON:  09/19/2019 FINDINGS: Brain: Periventricular white matter and corona radiata hypodensities favor chronic ischemic microvascular white matter disease. Otherwise, the brainstem, cerebellum, cerebral peduncles, thalamus, basal ganglia, basilar cisterns, and ventricular system appear within normal limits. No intracranial hemorrhage, mass lesion, or acute CVA. Vascular: There is atherosclerotic calcification of the cavernous carotid arteries bilaterally. Skull: Unremarkable Sinuses/Orbits: Chronic ethmoid,  frontal, and left maxillary sinusitis. Other: No supplemental non-categorized findings. IMPRESSION: 1. No acute intracranial findings. 2. Periventricular white matter and corona radiata hypodensities favor chronic ischemic microvascular white matter disease. 3. Chronic paranasal sinusitis. Electronically Signed   By: Van Clines M.D.   On: 11/22/2019 20:21   DG Chest Portable 1 View  Result Date: 11/22/2019 CLINICAL DATA:  COVID and poor EF, presenting hypoxic. eval infiltrate and pulm congestion EXAM: PORTABLE CHEST 1 VIEW COMPARISON:  Radiograph and CT 09/26/2019 FINDINGS: Cardiomegaly similar to prior exam. Patchy heterogeneous bilateral airspace opacities in a mid-lower lung zone predominant distribution. Mild vascular congestion. No septal thickening. No pneumothorax or large pleural effusion. IMPRESSION: 1. Patchy heterogeneous bilateral airspace opacities, typical of COVID-19 pneumonia. There is mild vascular congestion, the possibility of an element of pulmonary edema is not entirely excluded. 2. Chronic cardiomegaly. Electronically  Signed   By: Keith Rake M.D.   On: 11/22/2019 19:44    ____________________________________________   PROCEDURES and INTERVENTIONS  Procedure(s) performed (including Critical Care):  .Critical Care Performed by: Vladimir Crofts, MD Authorized by: Vladimir Crofts, MD   Critical care provider statement:    Critical care time (minutes):  30   Critical care was necessary to treat or prevent imminent or life-threatening deterioration of the following conditions:  Respiratory failure and circulatory failure   Critical care was time spent personally by me on the following activities:  Discussions with consultants, evaluation of patient's response to treatment, examination of patient, ordering and performing treatments and interventions, ordering and review of laboratory studies, ordering and review of radiographic studies, pulse oximetry, re-evaluation of  patient's condition, obtaining history from patient or surrogate and review of old charts .1-3 Lead EKG Interpretation Performed by: Vladimir Crofts, MD Authorized by: Vladimir Crofts, MD     Interpretation: normal     ECG rate:  75   ECG rate assessment: normal     Rhythm: sinus rhythm     Ectopy: none     Conduction: normal      Medications  remdesivir 200 mg in sodium chloride 0.9% 250 mL IVPB (has no administration in time range)    Followed by  remdesivir 100 mg in sodium chloride 0.9 % 100 mL IVPB (has no administration in time range)  dexamethasone (DECADRON) injection 6 mg (6 mg Intravenous Given 11/22/19 1950)  lactated ringers bolus 500 mL (500 mLs Intravenous New Bag/Given 11/22/19 2117)    ____________________________________________   MDM / ED COURSE  59 year old woman presents from a group home hypoxic in the setting of COVID-19 requiring medical admission.  Patient hypoxic to 77% on room air requiring 6 L nasal cannula.  Exam is difficult due to her MR at baseline, but she has no evidence of focal neurologic deficits, distress, trauma.  Exam is most notable for her tachypnea.  Blood work with slight AKI on CKD, otherwise without acute derangements.  Nonischemic EKG without evidence of ACS.  CXR with expected infiltrates in the setting of COVID-19, without evidence of discrete lobar infiltration to suggest a bacterial pneumonia.  Due to her severe hypoxia, provided Decadron and remdesivir to treat COVID-19.  Due to her confusion and altered mental status, CT head obtained and without evidence of ICH or stroke.  As she progressed in the ED her blood pressure softened to maps in the mid 60s, and so empiric bolus of 500 cc of LR was provided.  Will to be gentle with her fluids due to her poor ejection fraction.  We will admit the patient to hospitalist medicine for further work-up and management of her hypoxic respiratory condition.  Clinical Course as of Nov 21 2120  Fri Nov 22, 2019    1933 Patient noted to desaturate to 77% on room air when taken off of her supplemental oxygen. Needing 6 L via nasal cannula at this time   [DS]  2114 Soft BP noted.  I am hesitant to provide too much fluids due to her poor ejection fraction.  We will empirically bolus 500 mL of LR.  No clinical change .  Awaiting hospitalist to call back   [DS]  2121 Spoke with admitting hospitalist who agrees to see the patient for admission.   [DS]    Clinical Course User Index [DS] Vladimir Crofts, MD     ____________________________________________   FINAL CLINICAL IMPRESSION(S) / ED DIAGNOSES  Final  diagnoses:  Confusion  Hypoxia  COVID-19  AKI (acute kidney injury) [facility])     ED Discharge Orders    None       Melissia Lahman   Note:  This document was prepared using Dragon voice recognition software and may include unintentional dictation errors.   Vladimir Crofts, MD 11/22/19 2124

## 2019-11-23 DIAGNOSIS — N179 Acute kidney failure, unspecified: Secondary | ICD-10-CM

## 2019-11-23 DIAGNOSIS — N3001 Acute cystitis with hematuria: Secondary | ICD-10-CM | POA: Insufficient documentation

## 2019-11-23 DIAGNOSIS — N189 Chronic kidney disease, unspecified: Secondary | ICD-10-CM

## 2019-11-23 DIAGNOSIS — E039 Hypothyroidism, unspecified: Secondary | ICD-10-CM

## 2019-11-23 DIAGNOSIS — G9341 Metabolic encephalopathy: Secondary | ICD-10-CM

## 2019-11-23 DIAGNOSIS — J9600 Acute respiratory failure, unspecified whether with hypoxia or hypercapnia: Secondary | ICD-10-CM

## 2019-11-23 LAB — COMPREHENSIVE METABOLIC PANEL
ALT: 13 U/L (ref 0–44)
AST: 20 U/L (ref 15–41)
Albumin: 3 g/dL — ABNORMAL LOW (ref 3.5–5.0)
Alkaline Phosphatase: 75 U/L (ref 38–126)
Anion gap: 13 (ref 5–15)
BUN: 35 mg/dL — ABNORMAL HIGH (ref 6–20)
CO2: 18 mmol/L — ABNORMAL LOW (ref 22–32)
Calcium: 9.7 mg/dL (ref 8.9–10.3)
Chloride: 111 mmol/L (ref 98–111)
Creatinine, Ser: 1.94 mg/dL — ABNORMAL HIGH (ref 0.44–1.00)
GFR calc Af Amer: 32 mL/min — ABNORMAL LOW (ref >60)
GFR calc non Af Amer: 28 mL/min — ABNORMAL LOW (ref >60)
Glucose, Bld: 222 mg/dL — ABNORMAL HIGH (ref 70–99)
Potassium: 4.3 mmol/L (ref 3.5–5.1)
Sodium: 142 mmol/L (ref 135–145)
Total Bilirubin: 0.9 mg/dL (ref 0.3–1.2)
Total Protein: 6.6 g/dL (ref 6.5–8.1)

## 2019-11-23 LAB — CBC WITH DIFFERENTIAL/PLATELET
Abs Immature Granulocytes: 0.03 K/uL (ref 0.00–0.07)
Basophils Absolute: 0 K/uL (ref 0.0–0.1)
Basophils Relative: 0 %
Eosinophils Absolute: 0 K/uL (ref 0.0–0.5)
Eosinophils Relative: 0 %
HCT: 36.2 % (ref 36.0–46.0)
Hemoglobin: 11 g/dL — ABNORMAL LOW (ref 12.0–15.0)
Immature Granulocytes: 1 %
Lymphocytes Relative: 10 %
Lymphs Abs: 0.6 K/uL — ABNORMAL LOW (ref 0.7–4.0)
MCH: 26.4 pg (ref 26.0–34.0)
MCHC: 30.4 g/dL (ref 30.0–36.0)
MCV: 87 fL (ref 80.0–100.0)
Monocytes Absolute: 0.2 K/uL (ref 0.1–1.0)
Monocytes Relative: 4 %
Neutro Abs: 5.1 K/uL (ref 1.7–7.7)
Neutrophils Relative %: 85 %
Platelets: 235 K/uL (ref 150–400)
RBC: 4.16 MIL/uL (ref 3.87–5.11)
RDW: 16.5 % — ABNORMAL HIGH (ref 11.5–15.5)
WBC: 6 K/uL (ref 4.0–10.5)
nRBC: 0 % (ref 0.0–0.2)

## 2019-11-23 LAB — C-REACTIVE PROTEIN
CRP: 13.4 mg/dL — ABNORMAL HIGH (ref <1.0)
CRP: 13.5 mg/dL — ABNORMAL HIGH (ref <1.0)

## 2019-11-23 LAB — HEPATITIS B SURFACE ANTIGEN: Hepatitis B Surface Ag: NONREACTIVE

## 2019-11-23 LAB — FERRITIN: Ferritin: 66 ng/mL (ref 11–307)

## 2019-11-23 LAB — FIBRIN DERIVATIVES D-DIMER (ARMC ONLY): Fibrin derivatives D-dimer (ARMC): 889.72 ng/mL (FEU) — ABNORMAL HIGH (ref 0.00–499.00)

## 2019-11-23 MED ORDER — ALBUTEROL SULFATE HFA 108 (90 BASE) MCG/ACT IN AERS
2.0000 | INHALATION_SPRAY | Freq: Four times a day (QID) | RESPIRATORY_TRACT | Status: DC
  Administered 2019-11-23 – 2019-12-07 (×39): 2 via RESPIRATORY_TRACT
  Filled 2019-11-23 (×2): qty 6.7

## 2019-11-23 MED ORDER — SODIUM CHLORIDE 0.9 % IV BOLUS
500.0000 mL | Freq: Once | INTRAVENOUS | Status: AC
  Administered 2019-11-23: 500 mL via INTRAVENOUS

## 2019-11-23 MED ORDER — SODIUM CHLORIDE 0.9 % IV SOLN
1.0000 g | INTRAVENOUS | Status: DC
  Administered 2019-11-23 – 2019-11-25 (×3): 1 g via INTRAVENOUS
  Filled 2019-11-23 (×2): qty 1
  Filled 2019-11-23 (×2): qty 10

## 2019-11-23 NOTE — ED Notes (Signed)
Sitter at patient bedside at this time.

## 2019-11-23 NOTE — ED Notes (Signed)
Pt has removed monitor leads at this time

## 2019-11-23 NOTE — ED Notes (Signed)
Patient has been cleaned and diaper and purewick have been changed.

## 2019-11-23 NOTE — ED Notes (Signed)
Pt again has removed all cords from self

## 2019-11-23 NOTE — Progress Notes (Signed)
Patient ID: Elizabeth Gutierrez, female   DOB: [birth year], 59 y.o.   MRN: [id number] [facility] PROGRESS NOTE  Elizabeth Gutierrez WUJ:[id number] DOB: Aug 29, 1960 DOA: 11/22/2019 PCP: Elizabeth Lank, MD  HPI/Subjective: Patient brought in from group home with altered mental status and hypoxia.  As per mother she did have her Covid vaccination.  She was diagnosed with Covid on 11/10/2019.  Repeat Covid here on 11/22/2019 also positive.  Patient able to answer some yes or no questions.  Able to move her extremities.  Some cough.  Objective: Vitals:   11/23/19 1530 11/23/19 1545  BP: (!) 101/58   Pulse: 72   Resp:  16  Temp:    SpO2: 95%     Intake/Output Summary (Last 24 hours) at 11/23/2019 1616 Last data filed at 11/23/2019 0536 Gross per 24 hour  Intake --  Output 800 ml  Net -800 ml   Filed Weights   11/22/19 1933  Weight: 74.8 kg    ROS: Review of Systems  Respiratory: Positive for cough and shortness of breath.   Cardiovascular: Negative for chest pain.  Gastrointestinal: Negative for abdominal pain, nausea and vomiting.   Exam: Physical Exam HENT:     Nose: No mucosal edema.     Mouth/Throat:     Pharynx: No oropharyngeal exudate.  Eyes:     General: Lids are normal.     Conjunctiva/sclera: Conjunctivae normal.  Cardiovascular:     Rate and Rhythm: Normal rate and regular rhythm.     Heart sounds: Normal heart sounds, S1 normal and S2 normal.  Pulmonary:     Breath sounds: Examination of the right-lower field reveals decreased breath sounds and rhonchi. Examination of the left-lower field reveals decreased breath sounds and rhonchi. Decreased breath sounds and rhonchi present. No wheezing or rales.  Abdominal:     Palpations: Abdomen is soft.     Tenderness: There is no abdominal tenderness.  Musculoskeletal:     Right lower leg: No swelling.     Left lower leg: No swelling.  Skin:    General: Skin is warm.     Findings: No rash.  Neurological:     Mental Status: She is  alert.     Comments: Answers some yes/no questions and moves her extremities       Data Reviewed: Basic Metabolic Panel: Recent Labs  Lab 11/22/19 1945 11/22/19 2216 11/23/19 0400  NA 140  --  142  K 3.8  --  4.3  CL 109  --  111  CO2 19*  --  18*  GLUCOSE 137*  --  222*  BUN 33*  --  35*  CREATININE 1.95* 2.04* 1.94*  CALCIUM 9.7  --  9.7  MG 2.3  --   --    Liver Function Tests: Recent Labs  Lab 11/22/19 1945 11/23/19 0400  AST 21 20  ALT 14 13  ALKPHOS 68 75  BILITOT 0.9 0.9  PROT 6.9 6.6  ALBUMIN 3.2* 3.0*   CBC: Recent Labs  Lab 11/22/19 1945 11/22/19 2216 11/23/19 0400  WBC 7.1 7.3 6.0  NEUTROABS 5.7  --  5.1  HGB 10.9* 10.7* 11.0*  HCT 36.6 36.8 36.2  MCV 88.8 90.0 87.0  PLT 216 219 235   BNP (last 3 results) Recent Labs    09/19/19 0236 09/26/19 1557 11/22/19 1945  BNP 75.4 552.6* 98.8     Recent Results (from the past 240 hour(s))  SARS Coronavirus 2 by RT PCR (hospital order, performed  in [place] lab) Nasopharyngeal Nasopharyngeal Swab     Status: Abnormal   Collection Time: 11/22/19  7:45 PM   Specimen: Nasopharyngeal Swab  Result Value Ref Range Status   SARS Coronavirus 2 POSITIVE (A) NEGATIVE Final    Comment: RESULT CALLED TO, READ BACK BY AND VERIFIED WITH: CHRIS BUCKNER 11/22/19 AT 2048 HS (NOTE) SARS-CoV-2 target nucleic acids are DETECTED  SARS-CoV-2 RNA is generally detectable in upper respiratory specimens  during the acute phase of infection.  Positive results are indicative  of the presence of the identified virus, but do not rule out bacterial infection or co-infection with other pathogens not detected by the test.  Clinical correlation with patient history and  other diagnostic information is necessary to determine patient infection status.  The expected result is negative.  Fact Sheet for Patients:   StrictlyIdeas.no   Fact Sheet for Healthcare Providers:    BankingDealers.co.za    This test is not yet approved or cleared by the [place] FDA and  has been authorized for detection and/or diagnosis of SARS-CoV-2 by FDA under an Emergency Use Authorization (EUA).  This EUA will remain in effect (meaning this test  can be used) for the duration of  the COVID-19 declaration under Section 564(b)(1) of the Act, 21 U.S.C. section 360-bbb-3(b)(1), unless the authorization is terminated or revoked sooner.  Performed at [facility], [address], [place], [place] [address]      Studies: CT Head Wo Contrast  Result Date: 11/22/2019 CLINICAL DATA:  Altered mental status.  Hypoxia. EXAM: CT HEAD WITHOUT CONTRAST TECHNIQUE: Contiguous axial images were obtained from the base of the skull through the vertex without intravenous contrast. COMPARISON:  09/19/2019 FINDINGS: Brain: Periventricular white matter and corona radiata hypodensities favor chronic ischemic microvascular white matter disease. Otherwise, the brainstem, cerebellum, cerebral peduncles, thalamus, basal ganglia, basilar cisterns, and ventricular system appear within normal limits. No intracranial hemorrhage, mass lesion, or acute CVA. Vascular: There is atherosclerotic calcification of the cavernous carotid arteries bilaterally. Skull: Unremarkable Sinuses/Orbits: Chronic ethmoid, frontal, and left maxillary sinusitis. Other: No supplemental non-categorized findings. IMPRESSION: 1. No acute intracranial findings. 2. Periventricular white matter and corona radiata hypodensities favor chronic ischemic microvascular white matter disease. 3. Chronic paranasal sinusitis. Electronically Signed   By: Van Clines M.D.   On: 11/22/2019 20:21   DG Chest Portable 1 View  Result Date: 11/22/2019 CLINICAL DATA:  COVID and poor EF, presenting hypoxic. eval infiltrate and pulm congestion EXAM: PORTABLE CHEST 1 VIEW COMPARISON:  Radiograph and CT 09/26/2019 FINDINGS:  Cardiomegaly similar to prior exam. Patchy heterogeneous bilateral airspace opacities in a mid-lower lung zone predominant distribution. Mild vascular congestion. No septal thickening. No pneumothorax or large pleural effusion. IMPRESSION: 1. Patchy heterogeneous bilateral airspace opacities, typical of COVID-19 pneumonia. There is mild vascular congestion, the possibility of an element of pulmonary edema is not entirely excluded. 2. Chronic cardiomegaly. Electronically Signed   By: Keith Rake M.D.   On: 11/22/2019 19:44    Scheduled Meds: . albuterol  2 puff Inhalation Q6H  . ARIPiprazole  30 mg Oral QHS  . vitamin C  500 mg Oral Daily  . aspirin  81 mg Oral Daily  . atorvastatin  20 mg Oral q1800  . benztropine  0.5 mg Oral BID  . dexamethasone (DECADRON) injection  6 mg Intravenous Q24H  . enoxaparin (LOVENOX) injection  40 mg Subcutaneous Q24H  . fluticasone  2 spray Each Nare Daily  . gabapentin  200  mg Oral TID  . levothyroxine  75 mcg Oral QAC breakfast  . lithium carbonate  300 mg Oral Q12H  . pantoprazole  40 mg Oral Daily  . polyethylene glycol  17 g Oral Daily  . senna  1 tablet Oral QHS  . sertraline  100 mg Oral q AM  . traZODone  50 mg Oral QHS  . zinc sulfate  220 mg Oral Daily   Continuous Infusions: . cefTRIAXone (ROCEPHIN)  IV    . remdesivir 100 mg in NS 100 mL Stopped (11/23/19 1156)    Assessment/Plan:  1. Acute on chronic hypoxic respiratory failure secondary to COVID-19 pneumonia.  Patient had a pulse ox of 77% on room air.  As per patient's mother the patient does wear oxygen 2 L.  When I saw her today she was on 5 L and I dialed her down to 3L.  Remdesivir day 2.  Decadron IV.  Albuterol inhaler.  Patient did have Covid vaccination as per patient's mother. 2. Acute metabolic encephalopathy.  Seems a little bit better today than it was yesterday when she came in. 3. Acute kidney injury.  We will give a fluid bolus today and reassess kidney function  tomorrow.  Hold Lasix and Metformin 4. Acute cystitis.  Send off urine culture give a dose of Rocephin 5. Schizophrenia continue psychiatric medications 6. Neuropathy on gabapentin 7. Hypothyroidism unspecified on levothyroxine 8. Hyperlipidemia unspecified on Lipitor        Code Status:     Code Status Orders  (From admission, onward)         Start     Ordered   11/22/19 2126  Full code  Continuous        11/22/19 2133        Code Status History    Date Active Date Inactive Code Status Order ID Comments User Context   09/26/2019 2048 10/03/2019 0504 Full Code [id number]  Nicolette Bang, DO Inpatient   09/19/2019 1440 09/21/2019 2017 Full Code [id number]  Ivor Costa, MD ED   03/20/2019 1534 03/22/2019 0038 Full Code [id number]  Samuella Cota, MD ED   08/17/2016 1518 08/19/2016 1601 Full Code [id number]  Max Sane, MD Inpatient   Advance Care Planning Activity     Family Communication: Spoke with mother on phone Disposition Plan: Status is: Inpatient  Dispo: The patient is from: Group home              Anticipated d/c is to: Group home              Anticipated d/c date is: Likely will need a couple days here in the hospital receiving treatment              Patient currently being treated for acute hypoxic respiratory failure secondary to COVID-19 pneumonia receiving IV remdesivir and IV Decadron  Antibiotics:  rocephin  Time spent: 28 minutes    Elizabeth Gutierrez [employer]

## 2019-11-23 NOTE — ED Notes (Signed)
Pt had removed pulse ox off of finger, nurse enters room and reminds pt of keeping it on. Pt given water per request, attempted to administer pt medications again with no success.

## 2019-11-23 NOTE — ED Notes (Signed)
Pt again removed gown and took her clothes off and slides down in bed, items replaced on pt at this time and charge nurse notified of pt safety needs. Gilmore Laroche, Tech to be sitter in room

## 2019-11-23 NOTE — ED Notes (Signed)
Pharmacy called at this time by this nurse, requesting meds that are due to be administered and verification . States they will complete

## 2019-11-23 NOTE — ED Notes (Signed)
Pt attempting to get out of bed and has pulled all monitor leads off self. Pt redirected, pt had removed purewick in place and had urinated, cleaned by this nurse and Beth, Tech and complete bed change.

## 2019-11-24 DIAGNOSIS — E1169 Type 2 diabetes mellitus with other specified complication: Secondary | ICD-10-CM

## 2019-11-24 DIAGNOSIS — E785 Hyperlipidemia, unspecified: Secondary | ICD-10-CM

## 2019-11-24 DIAGNOSIS — N189 Chronic kidney disease, unspecified: Secondary | ICD-10-CM

## 2019-11-24 LAB — CBC WITH DIFFERENTIAL/PLATELET
Abs Immature Granulocytes: 0.08 10*3/uL — ABNORMAL HIGH (ref 0.00–0.07)
Basophils Absolute: 0 10*3/uL (ref 0.0–0.1)
Basophils Relative: 0 %
Eosinophils Absolute: 0 10*3/uL (ref 0.0–0.5)
Eosinophils Relative: 0 %
HCT: 37.9 % (ref 36.0–46.0)
Hemoglobin: 11.3 g/dL — ABNORMAL LOW (ref 12.0–15.0)
Immature Granulocytes: 1 %
Lymphocytes Relative: 9 %
Lymphs Abs: 0.7 10*3/uL (ref 0.7–4.0)
MCH: 26.3 pg (ref 26.0–34.0)
MCHC: 29.8 g/dL — ABNORMAL LOW (ref 30.0–36.0)
MCV: 88.3 fL (ref 80.0–100.0)
Monocytes Absolute: 0.4 10*3/uL (ref 0.1–1.0)
Monocytes Relative: 5 %
Neutro Abs: 7.1 10*3/uL (ref 1.7–7.7)
Neutrophils Relative %: 85 %
Platelets: 277 10*3/uL (ref 150–400)
RBC: 4.29 MIL/uL (ref 3.87–5.11)
RDW: 16.9 % — ABNORMAL HIGH (ref 11.5–15.5)
WBC: 8.3 10*3/uL (ref 4.0–10.5)
nRBC: 0 % (ref 0.0–0.2)

## 2019-11-24 LAB — COMPREHENSIVE METABOLIC PANEL
ALT: 15 U/L (ref 0–44)
AST: 19 U/L (ref 15–41)
Albumin: 2.8 g/dL — ABNORMAL LOW (ref 3.5–5.0)
Alkaline Phosphatase: 74 U/L (ref 38–126)
Anion gap: 8 (ref 5–15)
BUN: 40 mg/dL — ABNORMAL HIGH (ref 6–20)
CO2: 22 mmol/L (ref 22–32)
Calcium: 10.3 mg/dL (ref 8.9–10.3)
Chloride: 115 mmol/L — ABNORMAL HIGH (ref 98–111)
Creatinine, Ser: 1.63 mg/dL — ABNORMAL HIGH (ref 0.44–1.00)
GFR calc Af Amer: 40 mL/min — ABNORMAL LOW (ref >60)
GFR calc non Af Amer: 34 mL/min — ABNORMAL LOW (ref >60)
Glucose, Bld: 279 mg/dL — ABNORMAL HIGH (ref 70–99)
Potassium: 4.8 mmol/L (ref 3.5–5.1)
Sodium: 145 mmol/L (ref 135–145)
Total Bilirubin: 0.6 mg/dL (ref 0.3–1.2)
Total Protein: 6.3 g/dL — ABNORMAL LOW (ref 6.5–8.1)

## 2019-11-24 LAB — MRSA PCR SCREENING: MRSA by PCR: NEGATIVE

## 2019-11-24 LAB — FERRITIN: Ferritin: 77 ng/mL (ref 11–307)

## 2019-11-24 LAB — FIBRIN DERIVATIVES D-DIMER (ARMC ONLY): Fibrin derivatives D-dimer (ARMC): 822.74 ng/mL (FEU) — ABNORMAL HIGH (ref 0.00–499.00)

## 2019-11-24 LAB — GLUCOSE, CAPILLARY
Glucose-Capillary: 139 mg/dL — ABNORMAL HIGH (ref 70–99)
Glucose-Capillary: 174 mg/dL — ABNORMAL HIGH (ref 70–99)

## 2019-11-24 LAB — C-REACTIVE PROTEIN: CRP: 10 mg/dL — ABNORMAL HIGH (ref <1.0)

## 2019-11-24 LAB — PROCALCITONIN: Procalcitonin: 0.81 ng/mL

## 2019-11-24 MED ORDER — TRAZODONE HCL 50 MG PO TABS
100.0000 mg | ORAL_TABLET | Freq: Every day | ORAL | Status: DC
  Administered 2019-11-24 – 2019-11-25 (×2): 100 mg via ORAL
  Filled 2019-11-24 (×2): qty 2

## 2019-11-24 MED ORDER — INSULIN ASPART 100 UNIT/ML ~~LOC~~ SOLN: 0.0000 [IU] | Freq: Every day | SUBCUTANEOUS | Status: DC

## 2019-11-24 MED ORDER — INSULIN ASPART 100 UNIT/ML ~~LOC~~ SOLN
0.0000 [IU] | Freq: Three times a day (TID) | SUBCUTANEOUS | Status: DC
  Administered 2019-11-24: 17:00:00 2 [IU] via SUBCUTANEOUS
  Administered 2019-11-25: 5 [IU] via SUBCUTANEOUS
  Administered 2019-11-25 (×2): 3 [IU] via SUBCUTANEOUS
  Administered 2019-11-26: 08:00:00 11 [IU] via SUBCUTANEOUS
  Filled 2019-11-24 (×5): qty 1

## 2019-11-24 MED ORDER — QUETIAPINE FUMARATE 25 MG PO TABS
12.5000 mg | ORAL_TABLET | Freq: Every evening | ORAL | Status: DC | PRN
  Administered 2019-11-25: 12.5 mg via ORAL
  Filled 2019-11-24: qty 1

## 2019-11-24 MED ORDER — BARICITINIB 2 MG PO TABS
2.0000 mg | ORAL_TABLET | Freq: Every day | ORAL | Status: DC
  Administered 2019-11-24 – 2019-11-25 (×2): 2 mg via ORAL
  Filled 2019-11-24 (×3): qty 1

## 2019-11-24 NOTE — Progress Notes (Signed)
PT Cancellation Note  Patient Details Name: Elizabeth Gutierrez MRN: [id number] DOB: [birth year]   Cancelled Treatment:    Reason Eval/Treat Not Completed: Fatigue/lethargy limiting ability to participate (Consult received and chart reviewed.  Patient sleeping soundly upon arrival to room.  Opens eyes briefly to max stimulation, but quickly drifts back to sleep. Unable to maintain alertness for adequate participation with session. Will re-attempt at later time/date as medically appropriate and available.)   Volney Reierson H. Owens Shark, PT, DPT, NCS 11/24/19, 10:49 AM [phone]

## 2019-11-24 NOTE — Progress Notes (Signed)
Patient ID: Elizabeth Gutierrez, female   DOB: 06/27/1960, 59 y.o.   MRN: [id number] [facility] PROGRESS NOTE  SETAREH ROM GEX:[id number] DOB: Aug 20, 1960 DOA: 11/22/2019 PCP: Denton Lank, MD  HPI/Subjective: Patient awakened from sleep this morning.  Did not sleep well last night.  Has some cough.  Answered a few yes or no questions and went back to sleep.  Objective: Vitals:   11/24/19 0607 11/24/19 0800  BP: 128/74 124/76  Pulse: 82 80  Resp: 19 18  Temp:  98.2 F (36.8 C)  SpO2: 97% 97%    Intake/Output Summary (Last 24 hours) at 11/24/2019 1555 Last data filed at 11/24/2019 0300 Gross per 24 hour  Intake 200.02 ml  Output --  Net 200.02 ml   Filed Weights   11/22/19 1933  Weight: 74.8 kg    ROS: Review of Systems  Respiratory: Positive for cough. Negative for shortness of breath.   Cardiovascular: Negative for chest pain.  Gastrointestinal: Negative for abdominal pain, nausea and vomiting.   Exam: Physical Exam HENT:     Nose: No mucosal edema.     Mouth/Throat:     Pharynx: No oropharyngeal exudate.  Eyes:     General: Lids are normal.     Conjunctiva/sclera: Conjunctivae normal.  Cardiovascular:     Rate and Rhythm: Normal rate and regular rhythm.     Heart sounds: Normal heart sounds, S1 normal and S2 normal.  Pulmonary:     Breath sounds: Examination of the right-lower field reveals decreased breath sounds. Examination of the left-lower field reveals decreased breath sounds and rhonchi. Decreased breath sounds and rhonchi present. No wheezing or rales.  Abdominal:     Palpations: Abdomen is soft.     Tenderness: There is no abdominal tenderness.  Musculoskeletal:     Right ankle: No swelling.     Left ankle: No swelling.  Skin:    General: Skin is warm.     Findings: No rash.  Neurological:     Mental Status: She is lethargic.       Data Reviewed: Basic Metabolic Panel: Recent Labs  Lab 11/22/19 1945 11/22/19 2216 11/23/19 0400  11/24/19 0521  NA 140  --  142 145  K 3.8  --  4.3 4.8  CL 109  --  111 115*  CO2 19*  --  18* 22  GLUCOSE 137*  --  222* 279*  BUN 33*  --  35* 40*  CREATININE 1.95* 2.04* 1.94* 1.63*  CALCIUM 9.7  --  9.7 10.3  MG 2.3  --   --   --    Liver Function Tests: Recent Labs  Lab 11/22/19 1945 11/23/19 0400 11/24/19 0521  AST 21 20 19   ALT 14 13 15   ALKPHOS 68 75 74  BILITOT 0.9 0.9 0.6  PROT 6.9 6.6 6.3*  ALBUMIN 3.2* 3.0* 2.8*   CBC: Recent Labs  Lab 11/22/19 1945 11/22/19 2216 11/23/19 0400 11/24/19 0521  WBC 7.1 7.3 6.0 8.3  NEUTROABS 5.7  --  5.1 7.1  HGB 10.9* 10.7* 11.0* 11.3*  HCT 36.6 36.8 36.2 37.9  MCV 88.8 90.0 87.0 88.3  PLT 216 219 235 277   BNP (last 3 results) Recent Labs    09/19/19 0236 09/26/19 1557 11/22/19 1945  BNP 75.4 552.6* 98.8     Recent Results (from the past 240 hour(s))  SARS Coronavirus 2 by RT PCR (hospital order, performed in [facility] hospital lab) Nasopharyngeal Nasopharyngeal Swab  Status: Abnormal   Collection Time: 11/22/19  7:45 PM   Specimen: Nasopharyngeal Swab  Result Value Ref Range Status   SARS Coronavirus 2 POSITIVE (A) NEGATIVE Final    Comment: RESULT CALLED TO, READ BACK BY AND VERIFIED WITH: CHRIS BUCKNER 11/22/19 AT 2048 HS (NOTE) SARS-CoV-2 target nucleic acids are DETECTED  SARS-CoV-2 RNA is generally detectable in upper respiratory specimens  during the acute phase of infection.  Positive results are indicative  of the presence of the identified virus, but do not rule out bacterial infection or co-infection with other pathogens not detected by the test.  Clinical correlation with patient history and  other diagnostic information is necessary to determine patient infection status.  The expected result is negative.  Fact Sheet for Patients:   StrictlyIdeas.no   Fact Sheet for Healthcare Providers:   BankingDealers.co.za    This test is not yet  approved or cleared by the [place] FDA and  has been authorized for detection and/or diagnosis of SARS-CoV-2 by FDA under an Emergency Use Authorization (EUA).  This EUA will remain in effect (meaning this test  can be used) for the duration of  the COVID-19 declaration under Section 564(b)(1) of the Act, 21 U.S.C. section 360-bbb-3(b)(1), unless the authorization is terminated or revoked sooner.  Performed at [facility], [address] [place], [place] [address]   MRSA PCR Screening     Status: None   Collection Time: 11/23/19 11:26 PM   Specimen: Nasal Mucosa; Nasopharyngeal  Result Value Ref Range Status   MRSA by PCR NEGATIVE NEGATIVE Final    Comment:        The GeneXpert MRSA Assay (FDA approved for NASAL specimens only), is one component of a comprehensive MRSA colonization surveillance program. It is not intended to diagnose MRSA infection nor to guide or monitor treatment for MRSA infections. Performed at [facility], [address], [place], [place] [address]      Studies: CT Head Wo Contrast  Result Date: 11/22/2019 CLINICAL DATA:  Altered mental status.  Hypoxia. EXAM: CT HEAD WITHOUT CONTRAST TECHNIQUE: Contiguous axial images were obtained from the base of the skull through the vertex without intravenous contrast. COMPARISON:  09/19/2019 FINDINGS: Brain: Periventricular white matter and corona radiata hypodensities favor chronic ischemic microvascular white matter disease. Otherwise, the brainstem, cerebellum, cerebral peduncles, thalamus, basal ganglia, basilar cisterns, and ventricular system appear within normal limits. No intracranial hemorrhage, mass lesion, or acute CVA. Vascular: There is atherosclerotic calcification of the cavernous carotid arteries bilaterally. Skull: Unremarkable Sinuses/Orbits: Chronic ethmoid, frontal, and left maxillary sinusitis. Other: No supplemental non-categorized findings. IMPRESSION: 1. No acute  intracranial findings. 2. Periventricular white matter and corona radiata hypodensities favor chronic ischemic microvascular white matter disease. 3. Chronic paranasal sinusitis. Electronically Signed   By: Van Clines M.D.   On: 11/22/2019 20:21   DG Chest Portable 1 View  Result Date: 11/22/2019 CLINICAL DATA:  COVID and poor EF, presenting hypoxic. eval infiltrate and pulm congestion EXAM: PORTABLE CHEST 1 VIEW COMPARISON:  Radiograph and CT 09/26/2019 FINDINGS: Cardiomegaly similar to prior exam. Patchy heterogeneous bilateral airspace opacities in a mid-lower lung zone predominant distribution. Mild vascular congestion. No septal thickening. No pneumothorax or large pleural effusion. IMPRESSION: 1. Patchy heterogeneous bilateral airspace opacities, typical of COVID-19 pneumonia. There is mild vascular congestion, the possibility of an element of pulmonary edema is not entirely excluded. 2. Chronic cardiomegaly. Electronically Signed   By: Keith Rake M.D.   On: 11/22/2019 19:44  Scheduled Meds:  albuterol  2 puff Inhalation Q6H   ARIPiprazole  30 mg Oral QHS   vitamin C  500 mg Oral Daily   aspirin  81 mg Oral Daily   atorvastatin  20 mg Oral q1800   baricitinib  2 mg Oral Daily   benztropine  0.5 mg Oral BID   dexamethasone (DECADRON) injection  6 mg Intravenous Q24H   enoxaparin (LOVENOX) injection  40 mg Subcutaneous Q24H   fluticasone  2 spray Each Nare Daily   insulin aspart  0-15 Units Subcutaneous TID WC   insulin aspart  0-5 Units Subcutaneous QHS   levothyroxine  75 mcg Oral QAC breakfast   lithium carbonate  300 mg Oral Q12H   pantoprazole  40 mg Oral Daily   polyethylene glycol  17 g Oral Daily   senna  1 tablet Oral QHS   sertraline  100 mg Oral q AM   traZODone  100 mg Oral QHS   zinc sulfate  220 mg Oral Daily   Continuous Infusions:  cefTRIAXone (ROCEPHIN)  IV Stopped (11/23/19 2330)   remdesivir 100 mg in NS 100 mL 100 mg  (11/24/19 0834)    Assessment/Plan:  1. Acute on chronic hypoxic respiratory failure secondary to COVID-19 pneumonia.  Patient on 5 L of oxygen and normally wears 2.  Continue remdesivir day 3 today.  Continue IV Decadron.  Continue albuterol inhaler.  The patient was fully vaccinated for COVID-19 already.  Spoke with the patient's mother and started baricitinib today with increased oxygen requirements and elevated CRP. 2. Acute metabolic encephalopathy with lethargy.  Did not sleep last night.  Increase trazodone to 100 mg at night.  As needed Seroquel at night.  Get rid of gabapentin and tramadol. 3. Acute kidney injury on underlying chronic kidney disease stage IIIa.  Creatinine improved to 1.63 today. 4. Acute cystitis with hematuria.  Urine culture pending on Rocephin. 5. Schizophrenia.  Continue psychiatric medications 6. Neuropathy hold gabapentin with acute metabolic encephalopathy with lethargy today 7. Hypothyroidism unspecified on levothyroxine 8. Type 2 diabetes mellitus with hyperlipidemia unspecified on Lipitor.  On sliding scale insulin and add on a hemoglobin A1c. 9. Weakness.  Physical therapy evaluation when able to do so     Code Status:     Code Status Orders  (From admission, onward)         Start     Ordered   11/22/19 2126  Full code  Continuous        11/22/19 2133        Code Status History    Date Active Date Inactive Code Status Order ID Comments User Context   09/26/2019 2048 10/03/2019 0504 Full Code [id number]  Nicolette Bang, DO Inpatient   09/19/2019 1440 09/21/2019 2017 Full Code [id number]  Ivor Costa, MD ED   03/20/2019 1534 03/22/2019 0038 Full Code [id number]  Samuella Cota, MD ED   08/17/2016 1518 08/19/2016 1601 Full Code [id number]  Max Sane, MD Inpatient   Advance Care Planning Activity     Family Communication: Spoke with patient's mother on the phone Disposition Plan: Status is: Inpatient  Dispo: The patient is from: Group  home              Anticipated d/c is to: Group home              Anticipated d/c date is: Likely will need a few more days here in the hospital to assess mental status and treat  infection.              Patient currently with lethargy and acute metabolic encephalopathy.  Patient has COVID-19 infection and also acute cystitis.  Patient is also had increasing oxygen requirements.  Antibiotics:  Rocephin  Time spent: 27 minutes  [place]

## 2019-11-24 NOTE — Plan of Care (Signed)

## 2019-11-24 NOTE — TOC Initial Note (Signed)
Transition of Care [facility]) - Initial/Assessment Note    Patient Details  Name: Elizabeth Gutierrez MRN: [id number] Date of Birth: [birth year]  [facility]) CM/SW Contact:    Shelbie Hutching, RN Phone Number: 11/24/2019, 2:00 PM  Clinical Narrative:                 Patient admitted to the hospital with COVID requiring oxygen at 5L [place].  Patient has baseline oxygen requirements of 2L [place] from [employer].   Patient is from [place], group home supervisor is Elizabeth Gutierrez [phone].  Patient's legal guardian is her mother, Elizabeth Gutierrez.  Planned discharge will be back to [place] with home health services once medically stable.  Patient has used [facility] in the past.   [name] will cont to follow and assist with any additional needs.   Expected Discharge Plan: [place] (Group Home) Barriers to Discharge: Continued Medical Work up   Patient Goals and CMS Choice   CMS Medicare.gov Compare Post Acute Care list provided to:: Legal Guardian Choice offered to / list presented to : [facility] POA / Guardian  Expected Discharge Plan and Services Expected Discharge Plan: [place] (Group Home)   Discharge Planning Services: CM Consult Post Acute Care Choice: [place] arrangements for the past 2 months: [place]: [facility] ([facility])        Prior Living Arrangements/Services Living arrangements for the past 2 months: [place] with:: Facility Resident Patient language and need for interpreter reviewed:: Yes Do you feel safe going back to the place where you live?: Yes      Need for Family Participation in Patient Care: Yes (Comment) (COVID- lives in group home) Care giver support system in place?: Yes (comment) (mother) Current home services: DME (oxygen) Criminal Activity/Legal Involvement Pertinent to Current Situation/Hospitalization: No - Comment as  needed  Activities of Daily Living      Permission Sought/Granted Permission sought to share information with : Case Manager, Family Supports, Guardian, Chartered certified accountant granted to share information with : Yes, Verbal Permission Granted  Share Information with NAME: Elizabeth Gutierrez  Permission granted to share info w AGENCY: [name] and [facility]  Permission granted to share info w Relationship: mother/legal guardian     Emotional Assessment       Orientation: : Oriented to Self Alcohol / Substance Use: Not Applicable Psych Involvement: No (comment)  Admission diagnosis:  Confusion [R41.0] Hypoxia [R09.02] Acute respiratory failure with hypoxia ([place]) [J96.01] AKI (acute kidney injury) ([place]) [N17.9] Pneumonia due to COVID-19 virus [U07.1, J12.82] COVID-19 [U07.1] Patient Active Problem List   Diagnosis Date Noted  . AKI (acute kidney injury) ([place])   . Acute cystitis with hematuria   . Acute respiratory failure with hypoxia ([place]) 11/22/2019  . Acute respiratory failure due to COVID-19 ([place]) 11/22/2019  . Bilateral pneumonia 09/28/2019  . Shortness of breath 09/26/2019  . Hyperlipidemia   . Paranoid schizophrenia ([place])   . Type II diabetes mellitus with renal manifestations ([place])   . Hypothyroidism   . CKD (chronic kidney disease), stage IIIa   . Acute on chronic respiratory failure with hypoxia ([place])   . Acute metabolic encephalopathy 08/65/7846  . Altered mental state 08/17/2016  . DDD (degenerative  disc disease), lumbar 07/09/2015  . Facet syndrome, lumbar 07/09/2015  . Lumbar radiculopathy 07/09/2015  . Sacroiliac joint dysfunction 07/09/2015  . Spinal stenosis, lumbar region, 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  . Diabetic neuropathy ([place]) 07/09/2015  . CAD in native artery 06/16/2015  . Controlled type 2 diabetes mellitus without complication ([place]) 91/50/5697  . Benign essential HTN 06/16/2015  . Obstructive apnea 01/05/2015  .  Esophagitis, reflux 08/06/2014  . Edema leg 05/14/2014  . Chronic systolic heart failure ([place]) 05/14/2014  . TI (tricuspid incompetence) 05/14/2014  . Chest pain 12/13/2013  . Combined fat and carbohydrate induced hyperlipemia 10/09/2013  . Breath shortness 10/09/2013   PCP:  Denton Lank, MD Pharmacy:   [place], [place] - [place] [address] [place] [address] Phone: [phone] Fax: [phone]     Social Determinants of Health (SDOH) Interventions    Readmission Risk Interventions No flowsheet data found.

## 2019-11-25 DIAGNOSIS — R531 Weakness: Secondary | ICD-10-CM

## 2019-11-25 LAB — CBC WITH DIFFERENTIAL/PLATELET
Abs Immature Granulocytes: 0.08 10*3/uL — ABNORMAL HIGH (ref 0.00–0.07)
Basophils Absolute: 0 10*3/uL (ref 0.0–0.1)
Basophils Relative: 0 %
Eosinophils Absolute: 0 10*3/uL (ref 0.0–0.5)
Eosinophils Relative: 0 %
HCT: 41.8 % (ref 36.0–46.0)
Hemoglobin: 12.7 g/dL (ref 12.0–15.0)
Immature Granulocytes: 1 %
Lymphocytes Relative: 16 %
Lymphs Abs: 0.9 10*3/uL (ref 0.7–4.0)
MCH: 26.4 pg (ref 26.0–34.0)
MCHC: 30.4 g/dL (ref 30.0–36.0)
MCV: 86.9 fL (ref 80.0–100.0)
Monocytes Absolute: 0.5 10*3/uL (ref 0.1–1.0)
Monocytes Relative: 9 %
Neutro Abs: 4.3 10*3/uL (ref 1.7–7.7)
Neutrophils Relative %: 74 %
Platelets: 317 10*3/uL (ref 150–400)
RBC: 4.81 MIL/uL (ref 3.87–5.11)
RDW: 17.2 % — ABNORMAL HIGH (ref 11.5–15.5)
WBC: 5.8 10*3/uL (ref 4.0–10.5)
nRBC: 0 % (ref 0.0–0.2)

## 2019-11-25 LAB — GLUCOSE, CAPILLARY
Glucose-Capillary: 201 mg/dL — ABNORMAL HIGH (ref 70–99)
Glucose-Capillary: 195 mg/dL — ABNORMAL HIGH (ref 70–99)
Glucose-Capillary: 156 mg/dL — ABNORMAL HIGH (ref 70–99)
Glucose-Capillary: 187 mg/dL — ABNORMAL HIGH (ref 70–99)

## 2019-11-25 LAB — PROCALCITONIN: Procalcitonin: 0.89 ng/mL

## 2019-11-25 LAB — COMPREHENSIVE METABOLIC PANEL
ALT: 13 U/L (ref 0–44)
AST: 17 U/L (ref 15–41)
Albumin: 3 g/dL — ABNORMAL LOW (ref 3.5–5.0)
Alkaline Phosphatase: 77 U/L (ref 38–126)
Anion gap: 7 (ref 5–15)
BUN: 45 mg/dL — ABNORMAL HIGH (ref 6–20)
CO2: 25 mmol/L (ref 22–32)
Calcium: 10.8 mg/dL — ABNORMAL HIGH (ref 8.9–10.3)
Chloride: 117 mmol/L — ABNORMAL HIGH (ref 98–111)
Creatinine, Ser: 1.76 mg/dL — ABNORMAL HIGH (ref 0.44–1.00)
GFR calc Af Amer: 36 mL/min — ABNORMAL LOW (ref >60)
GFR calc non Af Amer: 31 mL/min — ABNORMAL LOW (ref >60)
Glucose, Bld: 254 mg/dL — ABNORMAL HIGH (ref 70–99)
Potassium: 5 mmol/L (ref 3.5–5.1)
Sodium: 149 mmol/L — ABNORMAL HIGH (ref 135–145)
Total Bilirubin: 0.6 mg/dL (ref 0.3–1.2)
Total Protein: 7.1 g/dL (ref 6.5–8.1)

## 2019-11-25 LAB — HEMOGLOBIN A1C
Hgb A1c MFr Bld: 7.6 % — ABNORMAL HIGH (ref 4.8–5.6)
Mean Plasma Glucose: 171.42 mg/dL

## 2019-11-25 LAB — FIBRIN DERIVATIVES D-DIMER (ARMC ONLY): Fibrin derivatives D-dimer (ARMC): 710.93 ng/mL (FEU) — ABNORMAL HIGH (ref 0.00–499.00)

## 2019-11-25 LAB — C-REACTIVE PROTEIN: CRP: 8.2 mg/dL — ABNORMAL HIGH (ref <1.0)

## 2019-11-25 LAB — FERRITIN: Ferritin: 66 ng/mL (ref 11–307)

## 2019-11-25 MED ORDER — FUROSEMIDE 10 MG/ML IJ SOLN
40.0000 mg | Freq: Once | INTRAMUSCULAR | Status: AC
  Administered 2019-11-25: 13:00:00 40 mg via INTRAVENOUS
  Filled 2019-11-25: qty 4

## 2019-11-25 MED ORDER — METHYLPREDNISOLONE SODIUM SUCC 40 MG IJ SOLR
40.0000 mg | Freq: Two times a day (BID) | INTRAMUSCULAR | Status: DC
  Administered 2019-11-25 – 2019-11-30 (×11): 40 mg via INTRAVENOUS
  Filled 2019-11-25 (×11): qty 1

## 2019-11-25 MED ORDER — ZINC OXIDE 40 % EX OINT
TOPICAL_OINTMENT | Freq: Three times a day (TID) | CUTANEOUS | Status: DC
  Filled 2019-11-25 (×5): qty 113

## 2019-11-25 NOTE — Progress Notes (Signed)
PT Cancellation Note  Patient Details Name: Elizabeth Gutierrez MRN: [id number] DOB: [birth year]   Cancelled Treatment:    Reason Eval/Treat Not Completed: Fatigue/lethargy limiting ability to participate (Chart reviewed, evaluation attempted. Pt alseep upon entry, awakens to gentle touch, immediately transitions to sobbing.) Author makes attempts to talk with patient, but pt's responses are difficult to understand, somewhat garbled. Pt not following simple commands. Checked in with RN later in day, RN reporting pt is again quite somnolent after performing toilet transfers MaxA+2 c RN earlier. Will attempt again at later date/time once pt is better able to participate.   3:41 PM, 11/25/19 Etta Grandchild, PT, DPT Physical Therapist - [facility]  [phone] ([place])     [place] C 11/25/2019, 3:39 PM

## 2019-11-25 NOTE — Progress Notes (Signed)
Pt up to bsc max assist x 2. Pt now resting comfortably in bed.  purewick in place

## 2019-11-25 NOTE — Progress Notes (Signed)
Gave update to patient's mother via telephone. Mother was told the doctor would call her for more updates this afternoon

## 2019-11-25 NOTE — Progress Notes (Signed)
Patient ID: Elizabeth Gutierrez, female   DOB: Jul 18, 1960, 59 y.o.   MRN: [id number] [facility] PROGRESS NOTE  Elizabeth Gutierrez KCL:[id number] DOB: Dec 22, 1960 DOA: 11/22/2019 PCP: Denton Lank, MD  HPI/Subjective: Patient not feeling well.  Answers some yes or no questions and falls back asleep.  This morning when I saw her her bed was wet with urine.  Came in with cough and found to have COVID-19 pneumonia and also urinary infection.  Objective: Vitals:   11/25/19 0800 11/25/19 1200  BP: 120/65 98/68  Pulse: 84 90  Resp: (!) 22 19  Temp: 98.2 F (36.8 C) 97.9 F (36.6 C)  SpO2: 94% 95%    Intake/Output Summary (Last 24 hours) at 11/25/2019 1342 Last data filed at 11/25/2019 0030 Gross per 24 hour  Intake --  Output 750 ml  Net -750 ml   Filed Weights   11/22/19 1933  Weight: 74.8 kg    ROS: Review of Systems  Respiratory: Positive for cough and shortness of breath.   Cardiovascular: Negative for chest pain.  Gastrointestinal: Negative for abdominal pain.   Exam: Physical Exam HENT:     Nose: No mucosal edema.     Mouth/Throat:     Pharynx: No oropharyngeal exudate.  Eyes:     General: Lids are normal.     Conjunctiva/sclera: Conjunctivae normal.  Cardiovascular:     Rate and Rhythm: Normal rate and regular rhythm.     Heart sounds: Normal heart sounds, S1 normal and S2 normal.  Pulmonary:     Breath sounds: Examination of the right-lower field reveals decreased breath sounds and rhonchi. Examination of the left-lower field reveals decreased breath sounds and rhonchi. Decreased breath sounds and rhonchi present. No wheezing or rales.  Abdominal:     Palpations: Abdomen is soft.     Tenderness: There is no abdominal tenderness.  Musculoskeletal:     Right lower leg: Edema present.     Left lower leg: Edema present.  Skin:    General: Skin is warm.     Findings: No rash.     Comments: As per nurse redness seen on buttock.  Neurological:     Mental Status: She is  lethargic.       Data Reviewed: Basic Metabolic Panel: Recent Labs  Lab 11/22/19 1945 11/22/19 2216 11/23/19 0400 11/24/19 0521 11/25/19 0624  NA 140  --  142 145 149*  K 3.8  --  4.3 4.8 5.0  CL 109  --  111 115* 117*  CO2 19*  --  18* 22 25  GLUCOSE 137*  --  222* 279* 254*  BUN 33*  --  35* 40* 45*  CREATININE 1.95* 2.04* 1.94* 1.63* 1.76*  CALCIUM 9.7  --  9.7 10.3 10.8*  MG 2.3  --   --   --   --    Liver Function Tests: Recent Labs  Lab 11/22/19 1945 11/23/19 0400 11/24/19 0521 11/25/19 0624  AST 21 20 19 17   ALT 14 13 15 13   ALKPHOS 68 75 74 77  BILITOT 0.9 0.9 0.6 0.6  PROT 6.9 6.6 6.3* 7.1  ALBUMIN 3.2* 3.0* 2.8* 3.0*   CBC: Recent Labs  Lab 11/22/19 1945 11/22/19 2216 11/23/19 0400 11/24/19 0521 11/25/19 0624  WBC 7.1 7.3 6.0 8.3 5.8  NEUTROABS 5.7  --  5.1 7.1 4.3  HGB 10.9* 10.7* 11.0* 11.3* 12.7  HCT 36.6 36.8 36.2 37.9 41.8  MCV 88.8 90.0 87.0 88.3 86.9  PLT 216 219  235 277 317   BNP (last 3 results) Recent Labs    09/19/19 0236 09/26/19 1557 11/22/19 1945  BNP 75.4 552.6* 98.8    CBG: Recent Labs  Lab 11/24/19 1654 11/24/19 2035 11/25/19 0732 11/25/19 1151  GLUCAP 139* 174* 201* 195*    Recent Results (from the past 240 hour(s))  SARS Coronavirus 2 by RT PCR (hospital order, performed in [facility] hospital lab) Nasopharyngeal Nasopharyngeal Swab     Status: Abnormal   Collection Time: 11/22/19  7:45 PM   Specimen: Nasopharyngeal Swab  Result Value Ref Range Status   SARS Coronavirus 2 POSITIVE (A) NEGATIVE Final    Comment: RESULT CALLED TO, READ BACK BY AND VERIFIED WITH: CHRIS BUCKNER 11/22/19 AT 2048 HS (NOTE) SARS-CoV-2 target nucleic acids are DETECTED  SARS-CoV-2 RNA is generally detectable in upper respiratory specimens  during the acute phase of infection.  Positive results are indicative  of the presence of the identified virus, but do not rule out bacterial infection or co-infection with other pathogens  not detected by the test.  Clinical correlation with patient history and  other diagnostic information is necessary to determine patient infection status.  The expected result is negative.  Fact Sheet for Patients:   StrictlyIdeas.no   Fact Sheet for Healthcare Providers:   BankingDealers.co.za    This test is not yet approved or cleared by the [place] FDA and  has been authorized for detection and/or diagnosis of SARS-CoV-2 by FDA under an Emergency Use Authorization (EUA).  This EUA will remain in effect (meaning this test  can be used) for the duration of  the COVID-19 declaration under Section 564(b)(1) of the Act, 21 U.S.C. section 360-bbb-3(b)(1), unless the authorization is terminated or revoked sooner.  Performed at [facility], [address], [place], [place] [address]   Urine Culture     Status: Abnormal (Preliminary result)   Collection Time: 11/22/19 11:25 PM   Specimen: Urine, Random  Result Value Ref Range Status   Specimen Description   Final    URINE, RANDOM Performed at [facility], [address], [place], [place] [address]    Special Requests   Final    NONE Performed at [facility], [address] [place], [place] [address]    Culture >=100,000 COLONIES/mL ESCHERICHIA COLI (A)  Final   Report Status PENDING  Incomplete  MRSA PCR Screening     Status: None   Collection Time: 11/23/19 11:26 PM   Specimen: Nasal Mucosa; Nasopharyngeal  Result Value Ref Range Status   MRSA by PCR NEGATIVE NEGATIVE Final    Comment:        The GeneXpert MRSA Assay (FDA approved for NASAL specimens only), is one component of a comprehensive MRSA colonization surveillance program. It is not intended to diagnose MRSA infection nor to guide or monitor treatment for MRSA infections. Performed at [facility], [address] [place], [place] [address]      Scheduled  Meds: . albuterol  2 puff Inhalation Q6H  . ARIPiprazole  30 mg Oral QHS  . vitamin C  500 mg Oral Daily  . aspirin  81 mg Oral Daily  . atorvastatin  20 mg Oral q1800  . baricitinib  2 mg Oral Daily  . benztropine  0.5 mg Oral BID  . enoxaparin (LOVENOX) injection  40 mg Subcutaneous Q24H  . fluticasone  2 spray Each Nare Daily  . insulin aspart  0-15 Units Subcutaneous TID WC  .  insulin aspart  0-5 Units Subcutaneous QHS  . levothyroxine  75 mcg Oral QAC breakfast  . lithium carbonate  300 mg Oral Q12H  . liver oil-zinc oxide   Topical TID  . methylPREDNISolone (SOLU-MEDROL) injection  40 mg Intravenous Q12H  . pantoprazole  40 mg Oral Daily  . polyethylene glycol  17 g Oral Daily  . senna  1 tablet Oral QHS  . sertraline  100 mg Oral q AM  . traZODone  100 mg Oral QHS  . zinc sulfate  220 mg Oral Daily   Continuous Infusions: . cefTRIAXone (ROCEPHIN)  IV 1 g (11/24/19 1621)  . remdesivir 100 mg in NS 100 mL 100 mg (11/25/19 0847)    Assessment/Plan:  1. Acute on chronic hypoxic respiratory failure secondary to COVID-19 pneumonia.  Patient on 7 L of oxygen this morning when I saw her and she normally wears 2.  I spoke with the nurse about if they need high flow nasal cannula that is okay to use.  Continue remdesivir day 4.  Switch IV Decadron over to Solu-Medrol.  Continue albuterol inhaler.  Started baricitinib yesterday.  Trial of Lasix.  We will get palliative care consultation. 2. Acute metabolic encephalopathy with lethargy.  Patient slept last night.  Continue to hold gabapentin and tramadol. 3. Acute kidney injury on underlying chronic kidney disease stage IIIb.  Creatinine up at 1.76. 4. Acute cystitis with hematuria.  Urine culture still pending.  Continue Rocephin 5. Schizophrenia.  No changes in psychiatric medications. 6. Type 2 diabetes mellitus with hyperlipidemia unspecified on Lipitor.  Hemoglobin A1c good at 7.6.  Continue Lipitor.  Sliding scale  insulin. 7. Neuropathy.  Hold gabapentin 8. Hypothyroidism unspecified on levothyroxine 9. Weakness.  Physical therapy evaluation once able to do so 10. Redness on the buttocks Desitin         Code Status:     Code Status Orders  (From admission, onward)         Start     Ordered   11/22/19 2126  Full code  Continuous        11/22/19 2133        Code Status History    Date Active Date Inactive Code Status Order ID Comments User Context   09/26/2019 2048 10/03/2019 0504 Full Code [id number]  Nicolette Bang, DO Inpatient   09/19/2019 1440 09/21/2019 2017 Full Code [id number]  Ivor Costa, MD ED   03/20/2019 1534 03/22/2019 0038 Full Code [id number]  Samuella Cota, MD ED   08/17/2016 1518 08/19/2016 1601 Full Code [id number]  Max Sane, MD Inpatient   Advance Care Planning Activity     Family Communication: Spoke with the patient's mother on the phone Disposition Plan: Status is: Inpatient   Dispo: The patient is from: Group home              Anticipated d/c is to: Group home              Anticipated d/c date is: With increasing oxygen requirements unable to determine discharge date at this point              Patient currently being treated for acute hypoxic respiratory failure secondary to COVID-19 pneumonia.  Still requiring IV steroids and remdesivir.  Time spent: 28 minutes  [place]

## 2019-11-26 ENCOUNTER — Inpatient Hospital Stay: Payer: Medicare Other

## 2019-11-26 DIAGNOSIS — Z7189 Other specified counseling: Secondary | ICD-10-CM

## 2019-11-26 DIAGNOSIS — Z515 Encounter for palliative care: Secondary | ICD-10-CM

## 2019-11-26 DIAGNOSIS — R000 Tachycardia, unspecified: Secondary | ICD-10-CM

## 2019-11-26 LAB — COMPREHENSIVE METABOLIC PANEL
ALT: 13 U/L (ref 0–44)
AST: 14 U/L — ABNORMAL LOW (ref 15–41)
Albumin: 3.7 g/dL (ref 3.5–5.0)
Alkaline Phosphatase: 89 U/L (ref 38–126)
Anion gap: 13 (ref 5–15)
BUN: 58 mg/dL — ABNORMAL HIGH (ref 6–20)
CO2: 24 mmol/L (ref 22–32)
Calcium: 11.7 mg/dL — ABNORMAL HIGH (ref 8.9–10.3)
Chloride: 112 mmol/L — ABNORMAL HIGH (ref 98–111)
Creatinine, Ser: 2 mg/dL — ABNORMAL HIGH (ref 0.44–1.00)
GFR calc Af Amer: 31 mL/min — ABNORMAL LOW (ref >60)
GFR calc non Af Amer: 27 mL/min — ABNORMAL LOW (ref >60)
Glucose, Bld: 377 mg/dL — ABNORMAL HIGH (ref 70–99)
Potassium: 4.9 mmol/L (ref 3.5–5.1)
Sodium: 149 mmol/L — ABNORMAL HIGH (ref 135–145)
Total Bilirubin: 0.7 mg/dL (ref 0.3–1.2)
Total Protein: 8.6 g/dL — ABNORMAL HIGH (ref 6.5–8.1)

## 2019-11-26 LAB — CBC WITH DIFFERENTIAL/PLATELET
Abs Immature Granulocytes: 0.12 10*3/uL — ABNORMAL HIGH (ref 0.00–0.07)
Basophils Absolute: 0 10*3/uL (ref 0.0–0.1)
Basophils Relative: 0 %
Eosinophils Absolute: 0 10*3/uL (ref 0.0–0.5)
Eosinophils Relative: 0 %
HCT: 50 % — ABNORMAL HIGH (ref 36.0–46.0)
Hemoglobin: 15.1 g/dL — ABNORMAL HIGH (ref 12.0–15.0)
Immature Granulocytes: 2 %
Lymphocytes Relative: 15 %
Lymphs Abs: 0.9 10*3/uL (ref 0.7–4.0)
MCH: 26.6 pg (ref 26.0–34.0)
MCHC: 30.2 g/dL (ref 30.0–36.0)
MCV: 88.2 fL (ref 80.0–100.0)
Monocytes Absolute: 0.4 10*3/uL (ref 0.1–1.0)
Monocytes Relative: 7 %
Neutro Abs: 5 10*3/uL (ref 1.7–7.7)
Neutrophils Relative %: 76 %
Platelets: 373 10*3/uL (ref 150–400)
RBC: 5.67 MIL/uL — ABNORMAL HIGH (ref 3.87–5.11)
RDW: 18.2 % — ABNORMAL HIGH (ref 11.5–15.5)
WBC: 6.5 10*3/uL (ref 4.0–10.5)
nRBC: 0 % (ref 0.0–0.2)

## 2019-11-26 LAB — URINE CULTURE: Culture: >=100000 — AB

## 2019-11-26 LAB — LITHIUM LEVEL: Lithium Lvl: 1.01 mmol/L (ref 0.60–1.20)

## 2019-11-26 LAB — GLUCOSE, CAPILLARY
Glucose-Capillary: 325 mg/dL — ABNORMAL HIGH (ref 70–99)
Glucose-Capillary: 98 mg/dL (ref 70–99)
Glucose-Capillary: 351 mg/dL — ABNORMAL HIGH (ref 70–99)
Glucose-Capillary: 174 mg/dL — ABNORMAL HIGH (ref 70–99)

## 2019-11-26 LAB — FERRITIN: Ferritin: 60 ng/mL (ref 11–307)

## 2019-11-26 LAB — TSH: TSH: 0.119 u[IU]/mL — ABNORMAL LOW (ref 0.350–4.500)

## 2019-11-26 LAB — VITAMIN D 25 HYDROXY (VIT D DEFICIENCY, FRACTURES): Vit D, 25-Hydroxy: 26.25 ng/mL — ABNORMAL LOW (ref 30–100)

## 2019-11-26 LAB — PROCALCITONIN: Procalcitonin: 1.08 ng/mL

## 2019-11-26 LAB — FIBRIN DERIVATIVES D-DIMER (ARMC ONLY): Fibrin derivatives D-dimer (ARMC): 730.75 ng/mL (FEU) — ABNORMAL HIGH (ref 0.00–499.00)

## 2019-11-26 LAB — C-REACTIVE PROTEIN: CRP: 5.4 mg/dL — ABNORMAL HIGH (ref <1.0)

## 2019-11-26 MED ORDER — ORAL CARE MOUTH RINSE
15.0000 mL | Freq: Two times a day (BID) | OROMUCOSAL | Status: DC
  Administered 2019-11-26 – 2019-12-07 (×21): 15 mL via OROMUCOSAL

## 2019-11-26 MED ORDER — MAGNESIUM SULFATE 2 GM/50ML IV SOLN
2.0000 g | Freq: Once | INTRAVENOUS | Status: AC
  Administered 2019-11-26: 2 g via INTRAVENOUS
  Filled 2019-11-26: qty 50

## 2019-11-26 MED ORDER — CALCITONIN (SALMON) 200 UNIT/ML IJ SOLN
4.0000 [IU]/kg | Freq: Two times a day (BID) | INTRAMUSCULAR | Status: AC
  Administered 2019-11-26 – 2019-11-27 (×4): 300 [IU] via SUBCUTANEOUS
  Filled 2019-11-26 (×4): qty 1.5

## 2019-11-26 MED ORDER — LEVOTHYROXINE SODIUM 50 MCG PO TABS
25.0000 ug | ORAL_TABLET | Freq: Every day | ORAL | Status: DC
  Administered 2019-11-27: 25 ug via ORAL
  Filled 2019-11-26: qty 1

## 2019-11-26 MED ORDER — CEFAZOLIN SODIUM-DEXTROSE 1-4 GM/50ML-% IV SOLN
1.0000 g | Freq: Two times a day (BID) | INTRAVENOUS | Status: DC
  Administered 2019-11-26 – 2019-11-27 (×2): 1 g via INTRAVENOUS
  Filled 2019-11-26 (×4): qty 50

## 2019-11-26 MED ORDER — CHLORHEXIDINE GLUCONATE CLOTH 2 % EX PADS
6.0000 | MEDICATED_PAD | Freq: Every day | CUTANEOUS | Status: DC
  Administered 2019-11-26 – 2019-12-02 (×6): 6 via TOPICAL

## 2019-11-26 MED ORDER — INSULIN ASPART 100 UNIT/ML ~~LOC~~ SOLN
0.0000 [IU] | Freq: Three times a day (TID) | SUBCUTANEOUS | Status: DC
  Administered 2019-11-26: 17:00:00 20 [IU] via SUBCUTANEOUS
  Administered 2019-11-27: 17:00:00 4 [IU] via SUBCUTANEOUS
  Administered 2019-11-27: 13:00:00 15 [IU] via SUBCUTANEOUS
  Administered 2019-11-27: 11 [IU] via SUBCUTANEOUS
  Administered 2019-11-28 (×2): 7 [IU] via SUBCUTANEOUS
  Administered 2019-11-28: 4 [IU] via SUBCUTANEOUS
  Filled 2019-11-26 (×7): qty 1

## 2019-11-26 MED ORDER — PANTOPRAZOLE SODIUM 40 MG IV SOLR
40.0000 mg | Freq: Every day | INTRAVENOUS | Status: DC
  Administered 2019-11-26 – 2019-12-06 (×11): 40 mg via INTRAVENOUS
  Filled 2019-11-26 (×11): qty 40

## 2019-11-26 MED ORDER — INSULIN ASPART 100 UNIT/ML ~~LOC~~ SOLN: 0.0000 [IU] | Freq: Every day | SUBCUTANEOUS | Status: DC

## 2019-11-26 MED ORDER — METOPROLOL TARTRATE 5 MG/5ML IV SOLN
5.0000 mg | INTRAVENOUS | Status: DC | PRN
  Administered 2019-11-26: 20:00:00 2.5 mg via INTRAVENOUS
  Filled 2019-11-26: qty 5

## 2019-11-26 MED ORDER — FUROSEMIDE 10 MG/ML IJ SOLN
40.0000 mg | Freq: Two times a day (BID) | INTRAMUSCULAR | Status: DC
  Administered 2019-11-26 (×2): 40 mg via INTRAVENOUS
  Filled 2019-11-26 (×2): qty 4

## 2019-11-26 MED ORDER — SODIUM CHLORIDE 0.9 % IV SOLN: INTRAVENOUS | Status: DC

## 2019-11-26 MED ORDER — DILTIAZEM HCL 30 MG PO TABS
30.0000 mg | ORAL_TABLET | Freq: Four times a day (QID) | ORAL | Status: DC
  Administered 2019-11-27: 30 mg via ORAL
  Filled 2019-11-26 (×3): qty 1

## 2019-11-26 NOTE — Progress Notes (Signed)
Rounding on patient and found nonrebreather pulled off patient. sats = 79%.  Pt refuses mask, high flow cannula placed back on patient 9L, maintaining 90% at this time

## 2019-11-26 NOTE — Progress Notes (Signed)
Paged MD and requested diltiazem IV route instead of po because pt is not taking anything po. MD responded to give metoprolol as ordered if pt needs IV

## 2019-11-26 NOTE — Progress Notes (Signed)
[place] was yellow because pt was being cleaned up.  RR and pulse are now GREEN

## 2019-11-26 NOTE — Progress Notes (Signed)
   11/26/19 1157  Vitals  Temp 99.7 F (37.6 C)  Temp Source Axillary  BP (!) 125/93  MAP (mmHg) 103  BP Location Right Arm  BP Method Automatic  Patient Position (if appropriate) Lying  Pulse Rate (!) 114  Resp (!) 21  MEWS COLOR  MEWS Score Color Yellow  Oxygen Therapy  SpO2 (!) 88 %  Pain Assessment  Pain Scale Faces  Pain Score 0  MEWS Score  MEWS Temp 0  MEWS Systolic 0  MEWS Pulse 2  MEWS RR 1  MEWS LOC 0  MEWS Score 3  Provider Notification  Provider Name/Title Dr Earleen Newport  Date Provider Notified 11/26/19  Time Provider Notified [id number]  Notification Type Page  Notification Reason Change in status  Response See new orders  Date of Provider Response 11/26/19  Time of Provider Response [id number]

## 2019-11-26 NOTE — Progress Notes (Signed)
Pt refuses all po intake including meds. She seals her lips shut. Pt is very lethargic. To notify MD at this time

## 2019-11-26 NOTE — Consult Note (Signed)
Consultation Note Date: 11/26/2019  Patient Name: Elizabeth Gutierrez  DOB: September 30, 1960  MRN: [id number]  Age / Sex: 59 y.o., female  PCP: Denton Lank, MD Referring Physician: Loletha Grayer, MD  Reason for Consultation: Establishing goals of care  HPI/Patient Profile:  The patient is a 59 yr old cognitively impaired woman who lives in a group home. She was diagnosed with COVID 19 on 11/10/2019. Today she was found to have altered mental status. She was saturating 77% on room air.   She has a past medical history significant for CHF (EF 30-35% from echo 09/27/2019), CAD (Hx MI), DM II, Hypertension, Hyperlipidemia, Paranoid Schizophrenia, Thyroid disease.  Clinical Assessment and Goals of Care: Patient is on covid isolation. She is unable to answer the phone.   Spoke with mother. She states her daughter was normal up until she was 15-1/2 when she " lost touch with reality".  She states that she has always been close with her daughter.  Her daughter never wanted to talk about death or dying.  She states that her daughter now lives in a group home and had been working a job up until July when she was hospitalized for pneumonia.  She states since that hospitalization her daughter has been chair and bedbound and has had to use oxygen.  She states her quality of life has been poor.  We discussed her diagnoses, prognosis, GOC, EOL wishes disposition and options.  A detailed discussion was had today regarding advanced directives.  Concepts specific to code status, artifical feeding and hydration, IV antibiotics and rehospitalization were discussed.  The difference between an aggressive medical intervention path and a comfort care path was discussed.  Values and goals of care important to patient and family were attempted to be elicited.  Discussed limitations of medical interventions to prolong quality of life in some  situations and discussed the concept of human mortality.  She states she does not know what her daughter would want as far as care moving forward.  She states that she understands that in addition to Covid, her heart output is poor and will progressively become worse.  She states that she will think about all of this, and talk to her other daughter as far as care moving forward for [name], and particularly CODE STATUS and a decision on ventilator support were that to become necessary.         SUMMARY OF RECOMMENDATIONS   Patient's mother needs time to consider care moving forward but will consider her options including CODE STATUS.  We will follow up later in the week.  Prognosis:   Poor overall       Primary Diagnoses: Present on Admission: . Chronic systolic heart failure ([place]) . CAD in native artery . Benign essential HTN . Altered mental state . Paranoid schizophrenia ([place]) . Hypothyroidism . Acute respiratory failure with hypoxia ([place])   I have reviewed the medical record, interviewed the patient and family, and examined the patient. The following aspects are pertinent.  Past Medical History:  Diagnosis Date  . CHF (congestive heart failure) ([place])   . Coronary artery disease   . Diabetes ([place])   . Heart attack ([place])   . Hyperlipidemia   . Hypertension   . MR (mental retardation)    Mild  . Paranoid schizophrenia ([place])   . Thyroid disease    Social History   Socioeconomic History  . Marital status: Single    Spouse name: Not on file  . Number of children: Not on file  . Years of education: Not on file  . Highest education level: Not on file  Occupational History  . Not on file  Tobacco Use  . Smoking status: Former Smoker    Quit date: 07/08/2013    Years since quitting: 6.3  . Smokeless tobacco: Former Network engineer and Sexual Activity  . Alcohol use: No    Alcohol/week: 0.0 standard drinks  . Drug use: No  . Sexual activity: Not on file  Other  Topics Concern  . Not on file  Social History Narrative  . Not on file   Social Determinants of Health   Financial Resource Strain:   . Difficulty of Paying Living Expenses: Not on file  Food Insecurity:   . Worried About Charity fundraiser in the Last Year: Not on file  . Ran Out of Food in the Last Year: Not on file  Transportation Needs:   . Lack of Transportation (Medical): Not on file  . Lack of Transportation (Non-Medical): Not on file  Physical Activity:   . Days of Exercise per Week: Not on file  . Minutes of Exercise per Session: Not on file  Stress:   . Feeling of Stress : Not on file  Social Connections:   . Frequency of Communication with Friends and Family: Not on file  . Frequency of Social Gatherings with Friends and Family: Not on file  . Attends Religious Services: Not on file  . Active Member of Clubs or Organizations: Not on file  . Attends Archivist Meetings: Not on file  . Marital Status: Not on file   Family History  Problem Relation Age of Onset  . Alcohol abuse Father   . Breast cancer Neg Hx    Scheduled Meds: . albuterol  2 puff Inhalation Q6H  . ARIPiprazole  30 mg Oral QHS  . vitamin C  500 mg Oral Daily  . aspirin  81 mg Oral Daily  . baricitinib  2 mg Oral Daily  . benztropine  0.5 mg Oral BID  . calcitonin  4 Units/kg Subcutaneous BID  . diltiazem  30 mg Oral Q6H  . enoxaparin (LOVENOX) injection  40 mg Subcutaneous Q24H  . fluticasone  2 spray Each Nare Daily  . furosemide  40 mg Intravenous BID  . insulin aspart  0-20 Units Subcutaneous TID WC  . insulin aspart  0-5 Units Subcutaneous QHS  . [START ON 11/27/2019] levothyroxine  25 mcg Oral QAC breakfast  . lithium carbonate  300 mg Oral Q12H  . liver oil-zinc oxide   Topical TID  . mouth rinse  15 mL Mouth Rinse BID  . methylPREDNISolone (SOLU-MEDROL) injection  40 mg Intravenous Q12H  . pantoprazole  40 mg Oral Daily  . polyethylene glycol  17 g Oral Daily  . senna  1  tablet Oral QHS  . sertraline  100 mg Oral q AM  . traZODone  100 mg Oral QHS  . zinc sulfate  220 mg Oral Daily  Continuous Infusions: . sodium chloride 75 mL/hr at 11/26/19 1129  .  ceFAZolin (ANCEF) IV    . magnesium sulfate bolus IVPB     PRN Meds:.acetaminophen, chlorpheniramine-HYDROcodone, dextromethorphan-guaiFENesin, docusate sodium, guaiFENesin-dextromethorphan, metoprolol tartrate Medications Prior to Admission:  Prior to Admission medications   Medication Sig Start Date End Date Taking? Authorizing Provider  ARIPiprazole (ABILIFY) 30 MG tablet Take 1 tablet (30 mg total) by mouth at bedtime. 09/21/19  Yes Hongalgi, Lenis Dickinson, MD  aspirin 81 MG tablet Take 81 mg by mouth daily.   Yes [provider]  atorvastatin (LIPITOR) 20 MG tablet Take 20 mg by mouth at bedtime.    Yes [provider]  benztropine (COGENTIN) 0.5 MG tablet Take 0.5 mg by mouth 2 (two) times daily.   Yes [provider]  carvedilol (COREG) 3.125 MG tablet Take 3.125 mg by mouth 2 (two) times daily. 12/26/18  Yes [provider]  docusate sodium (COLACE) 100 MG capsule Take 100 mg by mouth 2 (two) times daily as needed for mild constipation.    Yes [provider]  fluPHENAZine decanoate (PROLIXIN) 25 MG/ML injection Inject 31.25 mg into the muscle every 14 (fourteen) days.    Yes [provider]  fluticasone (FLONASE) 50 MCG/ACT nasal spray Place 2 sprays into both nostrils daily.    Yes [provider]  furosemide (LASIX) 20 MG tablet Take 1 tablet (20 mg total) by mouth daily as needed for fluid or edema. 10/01/19 09/30/20 Yes Jennye Boroughs, MD  gabapentin (NEURONTIN) 100 MG capsule Take 200 mg by mouth 3 (three) times daily.   Yes [provider]  levothyroxine (SYNTHROID, LEVOTHROID) 75 MCG tablet Take 75 mcg by mouth daily before breakfast.   Yes [provider]  lithium carbonate (LITHOBID) 300 MG CR tablet Take 300 mg by mouth  every 12 (twelve) hours.    Yes [provider]  metFORMIN (GLUCOPHAGE-XR) 500 MG 24 hr tablet Take 1,000 mg by mouth daily with breakfast.   Yes [provider]  naproxen (NAPROSYN) 500 MG tablet Take 500 mg by mouth 2 (two) times daily with a meal.   Yes [provider]  nystatin cream (MYCOSTATIN) Apply 1 application topically 4 (four) times daily.    Yes [provider]  omeprazole (PRILOSEC) 20 MG capsule Take 20 mg by mouth 2 (two) times daily.  12/26/18  Yes [provider]  senna (SENOKOT) 8.6 MG tablet Take 1 tablet by mouth at bedtime.    Yes [provider]  sertraline (ZOLOFT) 100 MG tablet Take 100 mg by mouth in the morning.    Yes [provider]  traZODone (DESYREL) 50 MG tablet Take 1 tablet (50 mg total) by mouth at bedtime. 08/19/16  Yes Wieting, Richard, MD  acetaminophen (TYLENOL) 325 MG tablet Take 650 mg by mouth every 4 (four) hours as needed for moderate pain, fever or headache.     [provider]  dextromethorphan-guaiFENesin (DIABETIC TUSSIN DM) 10-100 MG/5ML liquid Take by mouth every 4 (four) hours as needed for cough.    [provider]  polyethylene glycol (MIRALAX) 17 g packet Take 17 g by mouth daily.     [provider]   No Known Allergies  Vital Signs: BP (!) 125/93 (BP Location: Right Arm)   Pulse (!) 116   Temp 99.7 F (37.6 C) (Axillary)   Resp 18   Ht 5\' 3"  (1.6 m)   Wt 74.8 kg   LMP  (LMP  Unknown)   SpO2 92%   BMI 29.23 kg/m  Pain Scale: Faces   Pain Score: 0-No pain   SpO2: SpO2: 92 % O2 Device:SpO2: 92 % O2 Flow Rate: .O2 Flow Rate (L/min): 6 L/min  IO: Intake/output summary: No intake or output data in the 24 hours ending 11/26/19 1355  LBM: Last BM Date: 11/25/19 (per report) Baseline Weight: Weight: 74.8 kg Most recent weight: Weight: 74.8 kg     Palliative Assessment/Data:    COVID-19 DISASTER DECLARATION:    FULL CONTACT PHYSICAL  EXAMINATION WAS NOT POSSIBLE DUE TO TREATMENT OF COVID-19  AND CONSERVATION OF PERSONAL PROTECTIVE EQUIPMENT.   Patient assessed or the symptoms described in the history of present illness.  In the context of the Global COVID-19 pandemic, which necessitated consideration that the patient might be at risk for infection with the SARS-CoV-2 virus that causes COVID-19, Institutional protocols and algorithms that pertain to the evaluation of patients at risk for COVID-19 are in a state of rapid change based on information released by regulatory bodies including the CDC and federal and state organizations. These policies and algorithms were followed during the patient's care while in hospital.  Time In: 1:30 Time Out: 2:40 Time Total: 70 min Greater than 50%  of this time was spent counseling and coordinating care related to the above assessment and plan.  Signed by: Asencion Gowda, NP   Please contact Palliative Medicine Team phone at [phone] for questions and concerns.  For individual provider: See Shea Evans

## 2019-11-26 NOTE — Progress Notes (Signed)
   11/26/19 2047  Assess: MEWS Score  Pulse Rate 99  Assess: MEWS Score  MEWS Temp 0  MEWS Systolic 0  MEWS Pulse 0  MEWS RR 0  MEWS LOC 0  MEWS Score 0  MEWS Score Color Green  Document  Patient Outcome Stabilized after interventions  Progress note created (see row info) Yes

## 2019-11-26 NOTE — Progress Notes (Signed)
MD and charge nurse notified MEWS is yellow, HR sustained around 116.  Asked MD if I could give "baby dose of metoprolol?"

## 2019-11-26 NOTE — Progress Notes (Signed)
Bladder scan = 617. To notify MD at this time

## 2019-11-26 NOTE — Progress Notes (Signed)
Pt on nonrebreather.  SATs = 95%, pt appears to be resting comfortably

## 2019-11-26 NOTE — Progress Notes (Signed)
Inpatient Diabetes Program Recommendations  AACE/ADA: New Consensus Statement on Inpatient Glycemic Control (2015)  Target Ranges:  Prepandial:   less than 140 mg/dL      Peak postprandial:   less than 180 mg/dL (1-2 hours)      Critically ill patients:  140 - 180 mg/dL   Lab Results  Component Value Date   GLUCAP 325 (H) 11/26/2019   HGBA1C 7.6 (H) 11/24/2019    Review of Glycemic Control Results for MECHILLE, VARGHESE (MRN [id number]) as of 11/26/2019 12:12  Ref. Range 11/25/2019 07:32 11/25/2019 11:51 11/25/2019 15:49 11/25/2019 20:38 11/26/2019 07:48  Glucose-Capillary Latest Ref Range: 70 - 99 mg/dL 201 (H) 195 (H) 156 (H) 187 (H) 325 (H)   Diabetes history: DM2 Outpatient Diabetes medications: Metformin 1 gm qd Current orders for Inpatient glycemic control: Novolog moderate correction tid + hs 0-5 units  Inpatient Diabetes Program Recommendations:   While patient unable to take much po in, consider -Increase Novolog moderate to q 4 hrs.  Thank you, Nani Gasser. Fujiko Picazo, RN, MSN, CDE  Diabetes Coordinator Inpatient Glycemic Control Team Team Pager [phone] (8am-5pm) 11/26/2019 12:16 PM

## 2019-11-26 NOTE — Progress Notes (Signed)
Patient ID: Elizabeth Gutierrez, female   DOB: 01-27-1961, 59 y.o.   MRN: [id number]  Urinary retention greater than 600.  Will place Foley catheter since patient will be difficult to do in and out catheterization secondary to altered mental status.  This may help out with her abdominal pain.  Dr Loletha Grayer

## 2019-11-26 NOTE — Progress Notes (Signed)
Patient ID: Elizabeth Gutierrez, female   DOB: 1960-07-24, 59 y.o.   MRN: [id number] [facility] PROGRESS NOTE  KAYDON CREEDON NIO:[id number] DOB: 25-May-1960 DOA: 11/22/2019 PCP: Denton Lank, MD  HPI/Subjective: Patient seen earlier and again this afternoon. Heart rate up when you score up. Patient was lethargic this morning and again this afternoon. Woke up with sternal rub this afternoon and was upset that I woke her up. Patient admitted with COVID-19 pneumonia altered mental status and UTI.  Objective: Vitals:   11/26/19 1200 11/26/19 1300  BP:    Pulse: (!) 102 (!) 116  Resp: 18   Temp:    SpO2:  92%   No intake or output data in the 24 hours ending 11/26/19 1351 Filed Weights   11/22/19 1933  Weight: 74.8 kg    ROS: Review of Systems  Unable to perform ROS: Acuity of condition   Exam: Physical Exam HENT:     Nose: No mucosal edema.     Mouth/Throat:     Pharynx: No oropharyngeal exudate.  Eyes:     General: Lids are normal.     Conjunctiva/sclera: Conjunctivae normal.  Cardiovascular:     Rate and Rhythm: Regular rhythm. Tachycardia present.     Heart sounds: Normal heart sounds, S1 normal and S2 normal.  Pulmonary:     Breath sounds: Examination of the right-middle field reveals decreased breath sounds. Examination of the left-middle field reveals decreased breath sounds. Examination of the right-lower field reveals decreased breath sounds and rhonchi. Examination of the left-lower field reveals decreased breath sounds and rhonchi. Decreased breath sounds and rhonchi present. No wheezing or rales.  Abdominal:     Palpations: Abdomen is soft.     Tenderness: There is abdominal tenderness.  Musculoskeletal:     Right lower leg: No swelling.     Left lower leg: No swelling.  Skin:    General: Skin is warm.     Comments: Redness on the buttock  Neurological:     Mental Status: She is lethargic.     Comments: Awakened with sternal rub this afternoon.       Data  Reviewed: Basic Metabolic Panel: Recent Labs  Lab 11/22/19 1945 11/22/19 1945 11/22/19 2216 11/23/19 0400 11/24/19 0521 11/25/19 0624 11/26/19 0609  NA 140  --   --  142 145 149* 149*  K 3.8  --   --  4.3 4.8 5.0 4.9  CL 109  --   --  111 115* 117* 112*  CO2 19*  --   --  18* 22 25 24   GLUCOSE 137*  --   --  222* 279* 254* 377*  BUN 33*  --   --  35* 40* 45* 58*  CREATININE 1.95*   < > 2.04* 1.94* 1.63* 1.76* 2.00*  CALCIUM 9.7  --   --  9.7 10.3 10.8* 11.7*  MG 2.3  --   --   --   --   --   --    < > = values in this interval not displayed.   Liver Function Tests: Recent Labs  Lab 11/22/19 1945 11/23/19 0400 11/24/19 0521 11/25/19 0624 11/26/19 0609  AST 21 20 19 17  14*  ALT 14 13 15 13 13   ALKPHOS 68 75 74 77 89  BILITOT 0.9 0.9 0.6 0.6 0.7  PROT 6.9 6.6 6.3* 7.1 8.6*  ALBUMIN 3.2* 3.0* 2.8* 3.0* 3.7   CBC: Recent Labs  Lab 11/22/19 1945 11/22/19 1945 11/22/19 2216  11/23/19 0400 11/24/19 0521 11/25/19 0624 11/26/19 0609  WBC 7.1   < > 7.3 6.0 8.3 5.8 6.5  NEUTROABS 5.7  --   --  5.1 7.1 4.3 5.0  HGB 10.9*   < > 10.7* 11.0* 11.3* 12.7 15.1*  HCT 36.6   < > 36.8 36.2 37.9 41.8 50.0*  MCV 88.8   < > 90.0 87.0 88.3 86.9 88.2  PLT 216   < > 219 235 277 317 373   < > = values in this interval not displayed.   BNP (last 3 results) Recent Labs    09/19/19 0236 09/26/19 1557 11/22/19 1945  BNP 75.4 552.6* 98.8     CBG: Recent Labs  Lab 11/25/19 1151 11/25/19 1549 11/25/19 2038 11/26/19 0748 11/26/19 1153  GLUCAP 195* 156* 187* 325* 98    Recent Results (from the past 240 hour(s))  SARS Coronavirus 2 by RT PCR (hospital order, performed in [facility] hospital lab) Nasopharyngeal Nasopharyngeal Swab     Status: Abnormal   Collection Time: 11/22/19  7:45 PM   Specimen: Nasopharyngeal Swab  Result Value Ref Range Status   SARS Coronavirus 2 POSITIVE (A) NEGATIVE Final    Comment: RESULT CALLED TO, READ BACK BY AND VERIFIED WITH: CHRIS BUCKNER  11/22/19 AT 2048 HS (NOTE) SARS-CoV-2 target nucleic acids are DETECTED  SARS-CoV-2 RNA is generally detectable in upper respiratory specimens  during the acute phase of infection.  Positive results are indicative  of the presence of the identified virus, but do not rule out bacterial infection or co-infection with other pathogens not detected by the test.  Clinical correlation with patient history and  other diagnostic information is necessary to determine patient infection status.  The expected result is negative.  Fact Sheet for Patients:   StrictlyIdeas.no   Fact Sheet for Healthcare Providers:   BankingDealers.co.za    This test is not yet approved or cleared by the [place] FDA and  has been authorized for detection and/or diagnosis of SARS-CoV-2 by FDA under an Emergency Use Authorization (EUA).  This EUA will remain in effect (meaning this test  can be used) for the duration of  the COVID-19 declaration under Section 564(b)(1) of the Act, 21 U.S.C. section 360-bbb-3(b)(1), unless the authorization is terminated or revoked sooner.  Performed at [facility], [address] [place], [place] [address]   Urine Culture     Status: Abnormal   Collection Time: 11/22/19 11:25 PM   Specimen: Urine, Random  Result Value Ref Range Status   Specimen Description   Final    URINE, RANDOM Performed at [facility], [address] [place], [place] [address]    Special Requests   Final    NONE Performed at [facility], [address] [place], [place] [address]    Culture >=100,000 COLONIES/mL ESCHERICHIA COLI (A)  Final   Report Status 11/26/2019 FINAL  Final   Organism ID, Bacteria ESCHERICHIA COLI (A)  Final      Susceptibility   Escherichia coli - MIC*    AMPICILLIN <=2 SENSITIVE Sensitive     CEFAZOLIN <=4 SENSITIVE Sensitive     CEFTRIAXONE <=0.25 SENSITIVE Sensitive     CIPROFLOXACIN  <=0.25 SENSITIVE Sensitive     GENTAMICIN <=1 SENSITIVE Sensitive     IMIPENEM <=0.25 SENSITIVE Sensitive     NITROFURANTOIN 64 INTERMEDIATE Intermediate     TRIMETH/SULFA <=20 SENSITIVE Sensitive     AMPICILLIN/SULBACTAM <=2 SENSITIVE Sensitive     PIP/TAZO <=  4 SENSITIVE Sensitive     * >=100,000 COLONIES/mL ESCHERICHIA COLI  MRSA PCR Screening     Status: None   Collection Time: 11/23/19 11:26 PM   Specimen: Nasal Mucosa; Nasopharyngeal  Result Value Ref Range Status   MRSA by PCR NEGATIVE NEGATIVE Final    Comment:        The GeneXpert MRSA Assay (FDA approved for NASAL specimens only), is one component of a comprehensive MRSA colonization surveillance program. It is not intended to diagnose MRSA infection nor to guide or monitor treatment for MRSA infections. Performed at [facility], [address] [place], [place] [address]      Scheduled Meds:  albuterol  2 puff Inhalation Q6H   ARIPiprazole  30 mg Oral QHS   vitamin C  500 mg Oral Daily   aspirin  81 mg Oral Daily   baricitinib  2 mg Oral Daily   benztropine  0.5 mg Oral BID   calcitonin  4 Units/kg Subcutaneous BID   diltiazem  30 mg Oral Q6H   enoxaparin (LOVENOX) injection  40 mg Subcutaneous Q24H   fluticasone  2 spray Each Nare Daily   furosemide  40 mg Intravenous BID   insulin aspart  0-20 Units Subcutaneous TID WC   insulin aspart  0-5 Units Subcutaneous QHS   [START ON 11/27/2019] levothyroxine  25 mcg Oral QAC breakfast   lithium carbonate  300 mg Oral Q12H   liver oil-zinc oxide   Topical TID   mouth rinse  15 mL Mouth Rinse BID   methylPREDNISolone (SOLU-MEDROL) injection  40 mg Intravenous Q12H   pantoprazole  40 mg Oral Daily   polyethylene glycol  17 g Oral Daily   senna  1 tablet Oral QHS   sertraline  100 mg Oral q AM   traZODone  100 mg Oral QHS   zinc sulfate  220 mg Oral Daily   Continuous Infusions:  sodium chloride 75 mL/hr at 11/26/19 1129     ceFAZolin (ANCEF) IV     magnesium sulfate bolus IVPB      Assessment/Plan:  1. Acute on chronic hypoxic respiratory failure secondary to COVID-19 pneumonia. Patient on 9 L of oxygen high flow nasal cannula. Normally she wears 2 L. Today is day 5 of remdesivir. Continue Solu-Medrol. Continue albuterol inhaler. Continue baricitinib if able to take. Trial of Lasix. Palliative care consultation. Spoke with mother on the phone and patient is still a full code at this time. We will add 100% nonrebreather since breathes through her mouth. Repeat chest x-ray 2. Acute metabolic encephalopathy with lethargy. Patient was unable to take her meds this morning. 3. Hypercalcemia. Will start IV fluids and continue Lasix IV and Miacalcin injections. Send PTH, PTH RP, SPEP, vitamin D. 4. Tachycardia. Low-dose Cardizem and as needed IV metoprolol 5. Abdominal pain. I asked nurse to do a bladder scan.  Continue to monitor.  Add PPI. 6. Acute kidney injury on chronic kidney disease stage IIIb.  7. Acute cystitis with hematuria. E. coli growing out of the urine culture Rocephin switched over to Ancef. Finished 5 days. 8. Schizophrenia. No changes in psychiatric medications 9. Type 2 diabetes mellitus with hyperlipidemia. Hold Lipitor at this time hemoglobin A1c good. Continue sliding scale insulin. 10. Neuropathy. Hold gabapentin with altered mental status 11. Hypothyroidism unspecified. TSH in hyperthyroid range decrease dose of levothyroxine for tomorrow 12. Weakness. Unable to participate with physical therapy 13. Redness on buttocks. Continue Desitin    Code Status:  Code Status Orders  (From admission, onward)         Start     Ordered   11/22/19 2126  Full code  Continuous        11/22/19 2133        Code Status History    Date Active Date Inactive Code Status Order ID Comments User Context   09/26/2019 2048 10/03/2019 0504 Full Code [id number]  Nicolette Bang, DO Inpatient    09/19/2019 1440 09/21/2019 2017 Full Code [id number]  Ivor Costa, MD ED   03/20/2019 1534 03/22/2019 0038 Full Code [id number]  Samuella Cota, MD ED   08/17/2016 1518 08/19/2016 1601 Full Code [id number]  Max Sane, MD Inpatient   Advance Care Planning Activity     Family Communication: Spoke with mother on the phone Disposition Plan: Status is: Inpatient  Dispo: The patient is from: Group home              Anticipated d/c is to: Group home              Anticipated d/c date is: Unable to be determined at this time secondary to increasing oxygen requirements              Patient currently being treated for COVID-19 pneumonia and acute hypoxic respiratory failure UTI and acute metabolic encephalopathy  Time spent: 35 minutes in evaluating this morning and again this afternoon  [employer]

## 2019-11-26 NOTE — Progress Notes (Signed)
PT Cancellation Note  Patient Details Name: Elizabeth Gutierrez MRN: [id number] DOB: March 23, 1960   Cancelled Treatment:    Reason Eval/Treat Not Completed: Fatigue/lethargy limiting ability to participate.  Per nursing note pt very lethargic this morning.  Per discussion with pt's nurse, will hold PT at this time and re-attempt PT evaluation at a later date/time.  Leitha Bleak, PT 11/26/19, 9:18 AM

## 2019-11-26 NOTE — Progress Notes (Signed)
[place] visited pt. per RN suggestion; pt. lying in bed, awake.  Pt. responded to [facility] greeting, but her speech was difficult to understand and she closed her eyes and seemed to want to rest shortly after visit began.  CH is available as needed.

## 2019-11-26 NOTE — Plan of Care (Addendum)
PMT note:  Consult noted. Patient on covid isolation. Unable to reaach patient via phone. Multiple attempts made to call mother. VM left. Pallaitive medicine will reattempt GOC at a later time.

## 2019-11-26 NOTE — Plan of Care (Signed)
Consulted on call NP about pt's elevated HR - has sustained high 120's all day and has hit 130's a few times since shift change.  Pt is also much more lethargic than she was when I admitted her 40 nite.  According to day RN, she has been like this all day w/no PO intake - no food or meds.  Explained this to NP.  She said that since she's in a hyperthyroid state we don't want to bring down her heart rate to fast, her recommendation was just to give 2.5 mg IV metoprolol.  I did and HR has improved to low 100's. Pt does respond.

## 2019-11-26 NOTE — Progress Notes (Signed)
change is status is continueing to be worrysome. respirations rapid and shallow, ST 118, intermittent Vtach about 6-10 beats. please advise    Message sent to MD

## 2019-11-26 NOTE — Progress Notes (Signed)
PT Cancellation Note  Patient Details Name: RHILEE CURRIN MRN: [id number] DOB: Sep 18, 1960   Cancelled Treatment:    Reason Eval/Treat Not Completed: Fatigue/lethargy limiting ability to participate.  Nurse reports no change in pt's status from this morning (in regards to pt's lethargy which is limiting pt's ability to participate in therapy).  Will re-attempt PT evaluation at a later date/time.   Raquel Sarna Anya Murphey 11/26/2019, 11:55 AM

## 2019-11-27 ENCOUNTER — Inpatient Hospital Stay: Payer: Medicare Other

## 2019-11-27 DIAGNOSIS — I4891 Unspecified atrial fibrillation: Secondary | ICD-10-CM

## 2019-11-27 LAB — CBC WITH DIFFERENTIAL/PLATELET
Abs Immature Granulocytes: 0.41 10*3/uL — ABNORMAL HIGH (ref 0.00–0.07)
Basophils Absolute: 0.1 10*3/uL (ref 0.0–0.1)
Basophils Relative: 0 %
Eosinophils Absolute: 0 10*3/uL (ref 0.0–0.5)
Eosinophils Relative: 0 %
HCT: 53 % — ABNORMAL HIGH (ref 36.0–46.0)
Hemoglobin: 16.1 g/dL — ABNORMAL HIGH (ref 12.0–15.0)
Immature Granulocytes: 2 %
Lymphocytes Relative: 7 %
Lymphs Abs: 1.5 10*3/uL (ref 0.7–4.0)
MCH: 26.7 pg (ref 26.0–34.0)
MCHC: 30.4 g/dL (ref 30.0–36.0)
MCV: 87.7 fL (ref 80.0–100.0)
Monocytes Absolute: 1.3 10*3/uL — ABNORMAL HIGH (ref 0.1–1.0)
Monocytes Relative: 6 %
Neutro Abs: 17.8 10*3/uL — ABNORMAL HIGH (ref 1.7–7.7)
Neutrophils Relative %: 85 %
Platelets: 539 10*3/uL — ABNORMAL HIGH (ref 150–400)
RBC: 6.04 MIL/uL — ABNORMAL HIGH (ref 3.87–5.11)
RDW: 18.5 % — ABNORMAL HIGH (ref 11.5–15.5)
WBC: 21 10*3/uL — ABNORMAL HIGH (ref 4.0–10.5)
nRBC: 0 % (ref 0.0–0.2)

## 2019-11-27 LAB — COMPREHENSIVE METABOLIC PANEL
ALT: 10 U/L (ref 0–44)
AST: 13 U/L — ABNORMAL LOW (ref 15–41)
Albumin: 3.4 g/dL — ABNORMAL LOW (ref 3.5–5.0)
Alkaline Phosphatase: 93 U/L (ref 38–126)
Anion gap: 16 — ABNORMAL HIGH (ref 5–15)
BUN: 82 mg/dL — ABNORMAL HIGH (ref 6–20)
CO2: 21 mmol/L — ABNORMAL LOW (ref 22–32)
Calcium: 12.1 mg/dL — ABNORMAL HIGH (ref 8.9–10.3)
Chloride: 119 mmol/L — ABNORMAL HIGH (ref 98–111)
Creatinine, Ser: 2.66 mg/dL — ABNORMAL HIGH (ref 0.44–1.00)
GFR calc Af Amer: 22 mL/min — ABNORMAL LOW (ref >60)
GFR calc non Af Amer: 19 mL/min — ABNORMAL LOW (ref >60)
Glucose, Bld: 305 mg/dL — ABNORMAL HIGH (ref 70–99)
Potassium: 4.6 mmol/L (ref 3.5–5.1)
Sodium: 156 mmol/L — ABNORMAL HIGH (ref 135–145)
Total Bilirubin: 0.8 mg/dL (ref 0.3–1.2)
Total Protein: 8.3 g/dL — ABNORMAL HIGH (ref 6.5–8.1)

## 2019-11-27 LAB — BLOOD GAS, ARTERIAL
Acid-base deficit: 4.5 mmol/L — ABNORMAL HIGH (ref 0.0–2.0)
Bicarbonate: 21.7 mmol/L (ref 20.0–28.0)
FIO2: 0.6
O2 Saturation: 84.2 %
Patient temperature: 37
pCO2 arterial: 43 mmHg (ref 32.0–48.0)
pH, Arterial: 7.31 — ABNORMAL LOW (ref 7.350–7.450)
pO2, Arterial: 54 mmHg — ABNORMAL LOW (ref 83.0–108.0)

## 2019-11-27 LAB — C-REACTIVE PROTEIN: CRP: 1.9 mg/dL — ABNORMAL HIGH (ref <1.0)

## 2019-11-27 LAB — PROCALCITONIN: Procalcitonin: 1.34 ng/mL

## 2019-11-27 LAB — GLUCOSE, CAPILLARY
Glucose-Capillary: 298 mg/dL — ABNORMAL HIGH (ref 70–99)
Glucose-Capillary: 312 mg/dL — ABNORMAL HIGH (ref 70–99)
Glucose-Capillary: 177 mg/dL — ABNORMAL HIGH (ref 70–99)
Glucose-Capillary: 95 mg/dL (ref 70–99)

## 2019-11-27 LAB — LACTIC ACID, PLASMA
Lactic Acid, Venous: 1.6 mmol/L (ref 0.5–1.9)
Lactic Acid, Venous: 1.3 mmol/L (ref 0.5–1.9)

## 2019-11-27 LAB — PARATHYROID HORMONE, INTACT (NO CA): PTH: 111 pg/mL — ABNORMAL HIGH (ref 15–65)

## 2019-11-27 LAB — FIBRIN DERIVATIVES D-DIMER (ARMC ONLY): Fibrin derivatives D-dimer (ARMC): 922.22 ng/mL (FEU) — ABNORMAL HIGH (ref 0.00–499.00)

## 2019-11-27 LAB — FERRITIN: Ferritin: 61 ng/mL (ref 11–307)

## 2019-11-27 MED ORDER — TRAZODONE HCL 50 MG PO TABS: 50.0000 mg | ORAL_TABLET | Freq: Every day | ORAL | Status: DC

## 2019-11-27 MED ORDER — SODIUM CHLORIDE 0.45 % IV SOLN: INTRAVENOUS | Status: DC

## 2019-11-27 MED ORDER — INSULIN DETEMIR 100 UNIT/ML ~~LOC~~ SOLN
10.0000 [IU] | Freq: Every day | SUBCUTANEOUS | Status: DC
  Administered 2019-11-27 – 2019-11-28 (×2): 10 [IU] via SUBCUTANEOUS
  Filled 2019-11-27 (×3): qty 0.1

## 2019-11-27 MED ORDER — SODIUM CHLORIDE 0.9 % IV SOLN
500.0000 mg | INTRAVENOUS | Status: DC
  Administered 2019-11-27 – 2019-11-28 (×2): 500 mg via INTRAVENOUS
  Filled 2019-11-27 (×3): qty 500

## 2019-11-27 MED ORDER — SODIUM CHLORIDE 0.9 % IV SOLN
2.0000 g | INTRAVENOUS | Status: DC
  Administered 2019-11-27 – 2019-11-29 (×3): 2 g via INTRAVENOUS
  Filled 2019-11-27 (×4): qty 2

## 2019-11-27 MED ORDER — VITAMIN D 25 MCG (1000 UNIT) PO TABS
1000.0000 [IU] | ORAL_TABLET | Freq: Every day | ORAL | Status: DC
  Filled 2019-11-27 (×2): qty 1

## 2019-11-27 MED ORDER — ENOXAPARIN SODIUM 30 MG/0.3ML ~~LOC~~ SOLN
30.0000 mg | SUBCUTANEOUS | Status: DC
  Administered 2019-11-27 – 2019-11-29 (×3): 30 mg via SUBCUTANEOUS
  Filled 2019-11-27 (×3): qty 0.3

## 2019-11-27 NOTE — Progress Notes (Addendum)
PROGRESS NOTE    Elizabeth Gutierrez  KXF:[id number] DOB: 09-19-1960 DOA: 11/22/2019 PCP: Denton Lank, MD   Brief Narrative: Taken from H&P. The patient is a 59 yr old cognitively impaired woman who lives in a group home. She was diagnosed with COVID 19 on 11/10/2019.  past medical history significant for CHF (EF 30-35% from echo 09/27/2019), CAD (Hx MI), DM II, Hypertension, Hyperlipidemia, Paranoid Schizophrenia, Thyroid disease. Admitted for COVID-19 pneumonia, UTI with altered mental status.  Requiring up to 9 L with HFNC, uses 2 L at home. Completed the treatment with remdesivir and UTI. Developed hypernatremia and hypercalcemia.  Subjective:  Moans when call her name, not following any command.Per nursing concern chocking this morning with sips of water.   Assessment & Plan:   Principal Problem:   Acute respiratory failure due to COVID-19 [facility]) Active Problems:   Chronic systolic heart failure (HCC)   CAD in native artery   Controlled type 2 diabetes mellitus without complication (HCC)   Benign essential HTN   Altered mental state   Paranoid schizophrenia ([place])   Type 2 diabetes mellitus with hyperlipidemia (HCC)   Hypothyroidism   Acute respiratory failure with hypoxia (HCC)   Acute kidney injury superimposed on CKD (HCC)   Weakness   Hypercalcemia   Tachycardia  Acute hypoxic respiratory failure secondary to COVID-19 pneumonia.  Patient continued to require 9 L of oxygen with baseline of 2 L at home.  Completed the treatment for remdesivir.  Remained quite altered.  Elevated inflammatory markers which are now improving. Elevated procalcitonin and 1.34.  MRSA nasal swab was negative. -Start her on cefepime and Zithromax. -Continue Solu-Medrol. -Continue baricitinib. -Continue supportive care. -Continue supplements. -Palliative was consulted due to worsening encephalopathy-mother needs some time before making some decisions regarding CODE STATUS and further management.   Patient will remain full code till that time.  Acute metabolic encephalopathy.  Patient remained quite drowsy, unable to follow any commands although responding to vocal and tactile stimulus by moaning. Found to have hypercalcimia and hypernatremia which can contribute to encephalopathy.  Patient also has underlying psych illness and on multiple psych meds.  ABG shows pH of 7.31, PCO2 of 43 and PO2 of 54.  CT head was negative.  She received recent treatment for E. coli UTI with cefazolin. Procalcitonin elevated. -We will check blood cultures and lactic acid. -Decrease the dose of trazodone 50 mg at bedtime..  Hypercalcemia.  Can be contributory to her mental status.  Hypercalcemia labs with mildly decreased vitamin D and elevated parathyroid.  Patient also has CKD stage IIIb. -IV hydration with half saline. -Complete the course of Miacalcin. -Follow-up with rest of labs which includes parathyroid related peptide and SPEP.  Hypernatremia.  Patient appears dry.  She was given Lasix yesterday. -Switch normal saline with half saline-ideally should have received D5 but patient has marked hyperglycemia. -Continue to monitor.  Diabetes mellitus.  Elevated CBG. -Add Levemir 10 units at bedtime -Continue sliding scale with mealtime coverage.  AKI with CKD stage IIIb.  Worsening creatinine today.  Patient appears dry. -IV hydration. -Continue to monitor renal function. -Avoid nephrotoxins.  E. coli UTI.  Completed the course of antibiotics.  Acute urinary retention.  Patient developed acute urinary retention with abdominal pain yesterday requiring Foley catheter. -Continue to monitor-we will remove Foley catheter once some improvement in mental status.  Nursing concern of aspiration.  Most likely secondary to encephalopathy.  CT head was negative for any acute changes. -We will get swallow evaluation. -  Keep her n.p.o. till cleared from swallow team.  Schizophrenia.  Patient lives in a group  home.  Lithium within therapeutic range. -Continue antipsychotics. -Decrease the dose of trazodone due to increased lethargy.  Neuropathy.  Her home dose of gabapentin is being held due to increased lethargy and altered mental status.  Hypothyroidism.  Due to TSH in hyperthyroid range her home dose of Synthroid was decreased during current hospitalization. -Continue Synthroid-patient will need a repeat TSH in 4 to 6 weeks.  Physical deconditioning/generalized weakness.  PT was ordered but she is unable to participate due to her mental status.  Objective: Vitals:   11/26/19 2020 11/26/19 2047 11/26/19 2353 11/27/19 0358  BP:   106/85 119/90  Pulse: (!) 131 99 (!) 104 (!) 56  Resp:   19 17  Temp:   97.6 F (36.4 C) 98.1 F (36.7 C)  TempSrc:   Oral   SpO2:   90% 95%  Weight:      Height:        Intake/Output Summary (Last 24 hours) at 11/27/2019 0740 Last data filed at 11/27/2019 0300 Gross per 24 hour  Intake 359.26 ml  Output --  Net 359.26 ml   Filed Weights   11/22/19 1933  Weight: 74.8 kg    Examination:  General exam: Well-developed lady, appears lethargic, in no acute distress. Respiratory system: Clear to auscultation. Respiratory effort normal. Cardiovascular system: S1 & S2 heard, RRR. No JVD, murmurs, Gastrointestinal system: Soft, nontender, nondistended, bowel sounds positive. Central nervous system: Very lethargic, not following any command. Extremities: No edema, no cyanosis, pulses intact and symmetrical. Psychiatry: Judgement and insight appear impaired.  DVT prophylaxis: Lovenox Code Status: Full Family Communication: Mother was updated on phone. Disposition Plan:  Status is: Inpatient  Remains inpatient appropriate because:Inpatient level of care appropriate due to severity of illness   Dispo: The patient is from: Group home              Anticipated d/c is to: To be determined              Anticipated d/c date is: 2-3 days.              Patient  currently is not medically stable to d/c.   Consultants:   Palliative  Procedures:   Antimicrobials:   Cefepime Azithromax  Data Reviewed: I have personally reviewed following labs and imaging studies  CBC: Recent Labs  Lab 11/23/19 0400 11/24/19 0521 11/25/19 0624 11/26/19 0609 11/27/19 0532  WBC 6.0 8.3 5.8 6.5 21.0*  NEUTROABS 5.1 7.1 4.3 5.0 17.8*  HGB 11.0* 11.3* 12.7 15.1* 16.1*  HCT 36.2 37.9 41.8 50.0* 53.0*  MCV 87.0 88.3 86.9 88.2 87.7  PLT 235 277 317 373 409*   Basic Metabolic Panel: Recent Labs  Lab 11/22/19 1945 11/22/19 2216 11/23/19 0400 11/24/19 0521 11/25/19 0624 11/26/19 0609 11/27/19 0532  NA 140   < > 142 145 149* 149* 156*  K 3.8   < > 4.3 4.8 5.0 4.9 4.6  CL 109   < > 111 115* 117* 112* 119*  CO2 19*   < > 18* 22 25 24  21*  GLUCOSE 137*   < > 222* 279* 254* 377* 305*  BUN 33*   < > 35* 40* 45* 58* 82*  CREATININE 1.95*   < > 1.94* 1.63* 1.76* 2.00* 2.66*  CALCIUM 9.7   < > 9.7 10.3 10.8* 11.7* 12.1*  MG 2.3  --   --   --   --   --   --    < > =  values in this interval not displayed.   GFR: Estimated Creatinine Clearance: 22.1 mL/min (A) (by C-G formula based on SCr of 2.66 mg/dL (H)). Liver Function Tests: Recent Labs  Lab 11/23/19 0400 11/24/19 0521 11/25/19 0624 11/26/19 0609 11/27/19 0532  AST 20 19 17  14* 13*  ALT 13 15 13 13 10   ALKPHOS 75 74 77 89 93  BILITOT 0.9 0.6 0.6 0.7 0.8  PROT 6.6 6.3* 7.1 8.6* 8.3*  ALBUMIN 3.0* 2.8* 3.0* 3.7 3.4*   No results for input(s): LIPASE, AMYLASE in the last 168 hours. No results for input(s): AMMONIA in the last 168 hours. Coagulation Profile: No results for input(s): INR, PROTIME in the last 168 hours. Cardiac Enzymes: No results for input(s): CKTOTAL, CKMB, CKMBINDEX, TROPONINI in the last 168 hours. BNP (last 3 results) No results for input(s): PROBNP in the last 8760 hours. HbA1C: No results for input(s): HGBA1C in the last 72 hours. CBG: Recent Labs  Lab  11/25/19 2038 11/26/19 0748 11/26/19 1153 11/26/19 1554 11/26/19 2153  GLUCAP 187* 325* 98 351* 174*   Lipid Profile: No results for input(s): CHOL, HDL, LDLCALC, TRIG, CHOLHDL, LDLDIRECT in the last 72 hours. Thyroid Function Tests: Recent Labs    11/26/19 1007  TSH 0.119*   Anemia Panel: Recent Labs    11/26/19 0609 11/27/19 0532  FERRITIN 60 61   Sepsis Labs: Recent Labs  Lab 11/22/19 1945 11/22/19 2216 11/24/19 0521 11/25/19 0624 11/26/19 0609  PROCALCITON  --  0.95 0.81 0.89 1.08  LATICACIDVEN 0.9  --   --   --   --     Recent Results (from the past 240 hour(s))  SARS Coronavirus 2 by RT PCR (hospital order, performed in [facility] hospital lab) Nasopharyngeal Nasopharyngeal Swab     Status: Abnormal   Collection Time: 11/22/19  7:45 PM   Specimen: Nasopharyngeal Swab  Result Value Ref Range Status   SARS Coronavirus 2 POSITIVE (A) NEGATIVE Final    Comment: RESULT CALLED TO, READ BACK BY AND VERIFIED WITH: CHRIS BUCKNER 11/22/19 AT 2048 HS (NOTE) SARS-CoV-2 target nucleic acids are DETECTED  SARS-CoV-2 RNA is generally detectable in upper respiratory specimens  during the acute phase of infection.  Positive results are indicative  of the presence of the identified virus, but do not rule out bacterial infection or co-infection with other pathogens not detected by the test.  Clinical correlation with patient history and  other diagnostic information is necessary to determine patient infection status.  The expected result is negative.  Fact Sheet for Patients:   StrictlyIdeas.no   Fact Sheet for Healthcare Providers:   BankingDealers.co.za    This test is not yet approved or cleared by the [place] FDA and  has been authorized for detection and/or diagnosis of SARS-CoV-2 by FDA under an Emergency Use Authorization (EUA).  This EUA will remain in effect (meaning this test  can be used) for the duration  of  the COVID-19 declaration under Section 564(b)(1) of the Act, 21 U.S.C. section 360-bbb-3(b)(1), unless the authorization is terminated or revoked sooner.  Performed at [facility], [address], [place], [place] [address]   Urine Culture     Status: Abnormal   Collection Time: 11/22/19 11:25 PM   Specimen: Urine, Random  Result Value Ref Range Status   Specimen Description   Final    URINE, RANDOM Performed at [facility], [address], [place], [place] [address]    Special Requests   Final  NONE Performed at [facility], [address] [place], [place] [address]    Culture >=100,000 COLONIES/mL ESCHERICHIA COLI (A)  Final   Report Status 11/26/2019 FINAL  Final   Organism ID, Bacteria ESCHERICHIA COLI (A)  Final      Susceptibility   Escherichia coli - MIC*    AMPICILLIN <=2 SENSITIVE Sensitive     CEFAZOLIN <=4 SENSITIVE Sensitive     CEFTRIAXONE <=0.25 SENSITIVE Sensitive     CIPROFLOXACIN <=0.25 SENSITIVE Sensitive     GENTAMICIN <=1 SENSITIVE Sensitive     IMIPENEM <=0.25 SENSITIVE Sensitive     NITROFURANTOIN 64 INTERMEDIATE Intermediate     TRIMETH/SULFA <=20 SENSITIVE Sensitive     AMPICILLIN/SULBACTAM <=2 SENSITIVE Sensitive     PIP/TAZO <=4 SENSITIVE Sensitive     * >=100,000 COLONIES/mL ESCHERICHIA COLI  MRSA PCR Screening     Status: None   Collection Time: 11/23/19 11:26 PM   Specimen: Nasal Mucosa; Nasopharyngeal  Result Value Ref Range Status   MRSA by PCR NEGATIVE NEGATIVE Final    Comment:        The GeneXpert MRSA Assay (FDA approved for NASAL specimens only), is one component of a comprehensive MRSA colonization surveillance program. It is not intended to diagnose MRSA infection nor to guide or monitor treatment for MRSA infections. Performed at [facility], [address] [place], [place] [address]      Radiology Studies: DG Chest [place] 1 View  Result Date: 11/26/2019 CLINICAL DATA:   COVID-19 positivity with hypoxia EXAM: PORTABLE CHEST 1 VIEW COMPARISON:  11/22/2019 FINDINGS: Cardiac shadow is mildly enlarged but stable. Increasing airspace opacity is noted throughout both lungs accentuated by poor inspiratory effort but likely representing progressive involvement in COVID-19 pneumonia. No sizable effusion is seen. No bony abnormality is noted. IMPRESSION: Changes consistent with the given clinical history of COVID-19 pneumonia which have increased in the interval from the prior exam. Portion of this may be accentuated by poor inspiratory effort. Electronically Signed   By: Inez Catalina M.D.   On: 11/26/2019 15:05    Scheduled Meds: . albuterol  2 puff Inhalation Q6H  . ARIPiprazole  30 mg Oral QHS  . vitamin C  500 mg Oral Daily  . aspirin  81 mg Oral Daily  . baricitinib  2 mg Oral Daily  . benztropine  0.5 mg Oral BID  . calcitonin  4 Units/kg Subcutaneous BID  . Chlorhexidine Gluconate Cloth  6 each Topical Daily  . diltiazem  30 mg Oral Q6H  . enoxaparin (LOVENOX) injection  40 mg Subcutaneous Q24H  . fluticasone  2 spray Each Nare Daily  . insulin aspart  0-20 Units Subcutaneous TID WC  . insulin aspart  0-5 Units Subcutaneous QHS  . levothyroxine  25 mcg Oral QAC breakfast  . lithium carbonate  300 mg Oral Q12H  . liver oil-zinc oxide   Topical TID  . mouth rinse  15 mL Mouth Rinse BID  . methylPREDNISolone (SOLU-MEDROL) injection  40 mg Intravenous Q12H  . pantoprazole (PROTONIX) IV  40 mg Intravenous QHS  . polyethylene glycol  17 g Oral Daily  . senna  1 tablet Oral QHS  . sertraline  100 mg Oral q AM  . traZODone  100 mg Oral QHS  . zinc sulfate  220 mg Oral Daily   Continuous Infusions: . sodium chloride    .  ceFAZolin (ANCEF) IV 1 g (11/27/19 0545)     LOS: 5 days  Time spent: 45 minutes.  Lorella Nimrod, MD [facility]  If 7PM-7AM, please contact night-coverage [email or web address]  11/27/2019, 7:40 AM   This record has been created  using Systems analyst. Errors have been sought and corrected,but may not always be located. Such creation errors do not reflect on the standard of care.

## 2019-11-27 NOTE — Progress Notes (Signed)
Notified MD of na  = 156,  ca= 12.1

## 2019-11-27 NOTE — Progress Notes (Signed)
Update given to patient's mother for the second time today.  Mother was able to speak to daughter (pt) on the phone and patient responded appropriately to her mother.

## 2019-11-27 NOTE — Progress Notes (Signed)
Inpatient Diabetes Program Recommendations  AACE/ADA: New Consensus Statement on Inpatient Glycemic Control   Target Ranges:  Prepandial:   less than 140 mg/dL      Peak postprandial:   less than 180 mg/dL (1-2 hours)      Critically ill patients:  140 - 180 mg/dL   Results for ANNELISE, MCCOY (MRN [id number]) as of 11/27/2019 11:05  Ref. Range 11/26/2019 07:48 11/26/2019 11:53 11/26/2019 15:54 11/26/2019 21:53 11/27/2019 08:24  Glucose-Capillary Latest Ref Range: 70 - 99 mg/dL 325 (H) 98 351 (H) 174 (H) 298 (H)  Results for DEJON, JUNGMAN (MRN [id number]) as of 11/27/2019 11:05  Ref. Range 11/24/2019 05:21  Hemoglobin A1C Latest Ref Range: 4.8 - 5.6 % 7.6 (H)   Review of Glycemic Control  Diabetes history: DM2 Outpatient Diabetes medications: Metformin 1000 mg daily Current orders for Inpatient glycemic control: Novolog 0-20 units TID with meals, Novolog 0-5 units QHS; Solumedrol 40 mg Q12H  Inpatient Diabetes Program Recommendations:    Insulin: If steroids are continued, please consider ordering Levemir 7 units Q24H.  Thanks, Barnie Alderman, RN, MSN, CDE Diabetes Coordinator Inpatient Diabetes Program [phone] (Team Pager from 8am to 5pm)

## 2019-11-27 NOTE — Progress Notes (Signed)
PT Cancellation Note  Patient Details Name: Elizabeth Gutierrez MRN: [id number] DOB: Dec 12, 1960   Cancelled Treatment:    Reason Eval/Treat Not Completed: Fatigue/lethargy limiting ability to participate.  Per discussion with pt's nurse, pt continues to not be appropriate for therapy participation.  D/t pt has not been able to participate in therapy in last 3 days, will sign off at this time (discussed with pt's nurse).  Please re-consult PT when pt is able to participate in physical therapy.  Leitha Bleak, PT 11/27/19, 2:16 PM

## 2019-11-27 NOTE — Progress Notes (Signed)
   11/27/19 1639  Vitals  Temp (!) 97.4 F (36.3 C)  Temp Source Axillary  BP 101/71  MAP (mmHg) 81  BP Location Right Arm  BP Method Automatic  Patient Position (if appropriate) Lying  Pulse Rate (!) 140  Resp 18  MEWS COLOR  MEWS Score Color Yellow  Oxygen Therapy  SpO2 92 %  MEWS Score  MEWS Temp 0  MEWS Systolic 0  MEWS Pulse 3  MEWS RR 0  MEWS LOC 0  MEWS Score 3  notified charge nurse, continueing on yellow mews protocol.  HR elevated from repositioning.

## 2019-11-27 NOTE — Progress Notes (Signed)
PHARMACIST - PHYSICIAN COMMUNICATION  CONCERNING:  Enoxaparin (Lovenox) for DVT Prophylaxis    RECOMMENDATION: Patient was prescribed enoxaprin 40mg  q24 hours for VTE prophylaxis.   Filed Weights   11/22/19 1933  Weight: 74.8 kg (165 lb)    Body mass index is 29.23 kg/m.  Estimated Creatinine Clearance: 22.1 mL/min (A) (by C-G formula based on SCr of 2.66 mg/dL (H)).    Patient is candidate for enoxaparin 30mg  every 24 hours based on CrCl <37ml/min or Weight <45kg  DESCRIPTION: Pharmacy has adjusted enoxaparin dose per [facility] policy.  Patient is now receiving enoxaparin 30 mg every 24 hours    Rowland Lathe, PharmD Clinical Pharmacist  11/27/2019 8:58 AM

## 2019-11-27 NOTE — Progress Notes (Signed)
   11/27/19 0837  Vitals  Temp 98.1 F (36.7 C)  Temp Source Oral  BP 102/79  MAP (mmHg) 88  BP Location Right Arm  BP Method Automatic  Pulse Rate (!) 115  Pulse Rate Source Monitor  Resp (!) 22  Level of Consciousness  Level of Consciousness Alert  MEWS COLOR  MEWS Score Color Yellow  Oxygen Therapy  SpO2 98 %  O2 Device HFNC  O2 Flow Rate (L/min) 15 L/min  MEWS Score  MEWS Temp 0  MEWS Systolic 0  MEWS Pulse 2  MEWS RR 1  MEWS LOC 0  MEWS Score 3     11/27/19 0837  Vitals  Temp 98.1 F (36.7 C)  Temp Source Oral  BP 102/79  MAP (mmHg) 88  BP Location Right Arm  BP Method Automatic  Pulse Rate (!) 115  Pulse Rate Source Monitor  Resp (!) 22  Level of Consciousness  Level of Consciousness Alert  MEWS COLOR  MEWS Score Color Yellow  Oxygen Therapy  SpO2 98 %  O2 Device HFNC  O2 Flow Rate (L/min) 15 L/min  MEWS Score  MEWS Temp 0  MEWS Systolic 0  MEWS Pulse 2  MEWS RR 1  MEWS LOC 0  MEWS Score 3     11/27/19 0837  Vitals  Temp 98.1 F (36.7 C)  Temp Source Oral  BP 102/79  MAP (mmHg) 88  BP Location Right Arm  BP Method Automatic  Pulse Rate (!) 115  Pulse Rate Source Monitor  Resp (!) 22  Level of Consciousness  Level of Consciousness Alert  MEWS COLOR  MEWS Score Color Yellow  Oxygen Therapy  SpO2 98 %  O2 Device HFNC  O2 Flow Rate (L/min) 15 L/min  MEWS Score  MEWS Temp 0  MEWS Systolic 0  MEWS Pulse 2  MEWS RR 1  MEWS LOC 0  MEWS Score 3  yellow MEWS charge nurse Joylyn and dr Reesa Chew to be notified at this time

## 2019-11-27 NOTE — Progress Notes (Signed)
Notified Dr Reesa Chew pt is being made NPO due to obvious aspiration on small sip of water.

## 2019-11-27 NOTE — Consult Note (Signed)
Pharmacy Antibiotic Note  Elizabeth Gutierrez is a 59 y.o. female admitted on 11/22/2019 with altered mental status and hypoxia. Pt is from a group home. Pt is COVID positive since 8/22. PMH includes CHF, CAD, diabetes, HTN, HLD, schizophrenia, and thyroid disease. Pharmacy has been consulted for cefepime dosing for healthcare-acquired pneumonia.  Plan: cefepime 2 g q24H  Height: 5\' 3"  (160 cm) Weight: 74.8 kg (165 lb) IBW/kg (Calculated) : 52.4  Temp (24hrs), Avg:98.4 F (36.9 C), Min:97.4 F (36.3 C), Max:99.2 F (37.3 C)  Recent Labs  Lab 11/22/19 1945 11/22/19 2216 11/23/19 0400 11/24/19 0521 11/25/19 0624 11/26/19 0609 11/27/19 0532  WBC 7.1   < > 6.0 8.3 5.8 6.5 21.0*  CREATININE 1.95*   < > 1.94* 1.63* 1.76* 2.00* 2.66*  LATICACIDVEN 0.9  --   --   --   --   --   --    < > = values in this interval not displayed.    Estimated Creatinine Clearance: 22.1 mL/min (A) (by C-G formula based on SCr of 2.66 mg/dL (H)).    No Known Allergies  Antimicrobials this admission: 9/8 azithromycin >> 9/7 cefazolin >> 9/8 9/4 ceftriaxone >> 9/6 9/8 cefepime >>  Microbiology results: 9/8 BCx: pending 9/3 UCx: e. Coli  9/3 COVID PCR: positive 9/4 MRSA PCR: negative  Thank you for allowing pharmacy to be a part of this patient's care.  Benn Moulder, PharmD Pharmacy Resident  11/27/2019 5:10 PM

## 2019-11-28 ENCOUNTER — Inpatient Hospital Stay: Payer: Medicare Other

## 2019-11-28 DIAGNOSIS — E440 Moderate protein-calorie malnutrition: Secondary | ICD-10-CM | POA: Insufficient documentation

## 2019-11-28 DIAGNOSIS — Z515 Encounter for palliative care: Secondary | ICD-10-CM

## 2019-11-28 DIAGNOSIS — Z7189 Other specified counseling: Secondary | ICD-10-CM

## 2019-11-28 LAB — PROTEIN ELECTROPHORESIS, SERUM
A/G Ratio: 0.9 (ref 0.7–1.7)
Albumin ELP: 3.4 g/dL (ref 2.9–4.4)
Alpha-1-Globulin: 0.4 g/dL (ref 0.0–0.4)
Alpha-2-Globulin: 1 g/dL (ref 0.4–1.0)
Beta Globulin: 1.2 g/dL (ref 0.7–1.3)
Gamma Globulin: 1.4 g/dL (ref 0.4–1.8)
Globulin, Total: 4 g/dL — ABNORMAL HIGH (ref 2.2–3.9)
Total Protein ELP: 7.4 g/dL (ref 6.0–8.5)

## 2019-11-28 LAB — COMPREHENSIVE METABOLIC PANEL
ALT: 7 U/L (ref 0–44)
AST: 12 U/L — ABNORMAL LOW (ref 15–41)
Albumin: 3.1 g/dL — ABNORMAL LOW (ref 3.5–5.0)
Alkaline Phosphatase: 81 U/L (ref 38–126)
Anion gap: 17 — ABNORMAL HIGH (ref 5–15)
BUN: 96 mg/dL — ABNORMAL HIGH (ref 6–20)
CO2: 19 mmol/L — ABNORMAL LOW (ref 22–32)
Calcium: 11.7 mg/dL — ABNORMAL HIGH (ref 8.9–10.3)
Chloride: 123 mmol/L — ABNORMAL HIGH (ref 98–111)
Creatinine, Ser: 2.94 mg/dL — ABNORMAL HIGH (ref 0.44–1.00)
GFR calc Af Amer: 19 mL/min — ABNORMAL LOW (ref >60)
GFR calc non Af Amer: 17 mL/min — ABNORMAL LOW (ref >60)
Glucose, Bld: 188 mg/dL — ABNORMAL HIGH (ref 70–99)
Potassium: 4.4 mmol/L (ref 3.5–5.1)
Sodium: 159 mmol/L — ABNORMAL HIGH (ref 135–145)
Total Bilirubin: 1.3 mg/dL — ABNORMAL HIGH (ref 0.3–1.2)
Total Protein: 7.2 g/dL (ref 6.5–8.1)

## 2019-11-28 LAB — GLUCOSE, CAPILLARY
Glucose-Capillary: 184 mg/dL — ABNORMAL HIGH (ref 70–99)
Glucose-Capillary: 236 mg/dL — ABNORMAL HIGH (ref 70–99)
Glucose-Capillary: 203 mg/dL — ABNORMAL HIGH (ref 70–99)
Glucose-Capillary: 197 mg/dL — ABNORMAL HIGH (ref 70–99)
Glucose-Capillary: 104 mg/dL — ABNORMAL HIGH (ref 70–99)

## 2019-11-28 LAB — PROCALCITONIN: Procalcitonin: 1.09 ng/mL

## 2019-11-28 MED ORDER — OSMOLITE 1.2 CAL PO LIQD: 1000.0000 mL | ORAL | Status: DC

## 2019-11-28 MED ORDER — JEVITY 1.5 CAL/FIBER PO LIQD
1000.0000 mL | ORAL | Status: DC
  Administered 2019-11-29 – 2019-12-02 (×4): 1000 mL

## 2019-11-28 MED ORDER — LITHIUM CITRATE 300 MG/5 ML PO SYRP
300.0000 mg | Freq: Two times a day (BID) | ORAL | Status: DC
  Filled 2019-11-28 (×2): qty 5

## 2019-11-28 MED ORDER — FREE WATER: 200.0000 mL | Status: DC

## 2019-11-28 MED ORDER — PROSOURCE TF PO LIQD
45.0000 mL | Freq: Two times a day (BID) | ORAL | Status: DC
  Administered 2019-11-29 – 2019-12-04 (×12): 45 mL
  Filled 2019-11-28 (×15): qty 45

## 2019-11-28 MED ORDER — FREE WATER
250.0000 mL | Status: DC
  Administered 2019-11-29 – 2019-12-01 (×28): 250 mL

## 2019-11-28 NOTE — Progress Notes (Addendum)
PROGRESS NOTE    Elizabeth Gutierrez  OIZ:[id number] DOB: [birth year] DOA: 11/22/2019 PCP: Denton Lank, MD   Brief Narrative: Taken from H&P. The patient is a 59 yr old cognitively impaired woman who lives in a group home. She was diagnosed with COVID 19 on 11/10/2019.  past medical history significant for CHF (EF 30-35% from echo 09/27/2019), CAD (Hx MI), DM II, Hypertension, Hyperlipidemia, Paranoid Schizophrenia, Thyroid disease. Admitted for COVID-19 pneumonia, UTI with altered mental status.  Requiring up to 9 L with HFNC, uses 2 L at home. Completed the treatment with remdesivir and UTI. Developed hypernatremia and hypercalcemia.  Subjective:  Still very lethargic, open eyes on command.Appears very dry, asking for water. Unable to obtain formal swallow evaluation due to AMS.  Assessment & Plan:   Principal Problem:   Acute respiratory failure due to COVID-19 [facility]) Active Problems:   Chronic systolic heart failure (HCC)   CAD in native artery   Controlled type 2 diabetes mellitus without complication (HCC)   Benign essential HTN   Altered mental state   Paranoid schizophrenia ([place])   Type 2 diabetes mellitus with hyperlipidemia (HCC)   Hypothyroidism   Acute respiratory failure with hypoxia (HCC)   Acute kidney injury superimposed on CKD (HCC)   Weakness   Hypercalcemia   Tachycardia  Acute hypoxic respiratory failure secondary to COVID-19 pneumonia.  Patient continued to require 10-12 L of oxygen with baseline of 2 L at home.  Completed the treatment for remdesivir.  Remained quite altered.  Elevated inflammatory markers which are now improving. Elevated procalcitonin and 1.34.  MRSA nasal swab was negative. -Start her on cefepime and Zithromax. -Continue Solu-Medrol. -Discontinue baricitinib due to worsening leukocytosis and concern of bacterial infection. -Continue supportive care. -Continue supplements. -Palliative was consulted due to worsening encephalopathy-mother needs  some time before making some decisions regarding CODE STATUS and further management.  Patient will remain full code till that time.  Acute metabolic encephalopathy.  Patient remained quite drowsy, unable to follow any commands although responding to vocal and tactile stimulus by moaning. Found to have hypercalcimia and worsening hypernatremia which can contribute to encephalopathy.  Patient also has underlying psych illness and on multiple psych meds.  She received recent treatment for E. coli UTI with cefazolin. Procalcitonin elevated. -Blood cultures remain negative with normal lactic acid. -Discontinue trazodone at this time-can be restarted if needed.  Hypercalcemia.  Appears stable, can be contributory to her mental status.  Hypercalcemia labs with mildly decreased vitamin D and elevated parathyroid.  Patient also has CKD stage IIIb. -IV hydration with half saline. -Complete the course of Miacalcin. -Follow-up with rest of labs which includes parathyroid related peptide and SPEP.  Hypernatremia.  Patient appears dry.worsening Na.   -Increase half saline to 125-ideally should have received D5 but patient has marked hyperglycemia. -add free water 250 Q2H via tube. -Continue to monitor.  Diabetes mellitus.  Elevated CBG, improved as compare to yesterday. -Continue Levemir 10 units at bedtime -Continue sliding scale with mealtime coverage.  AKI with CKD stage IIIb.  Worsening creatinine today.  Patient appears dry. -IV hydration. -Continue to monitor renal function. -Avoid nephrotoxins.  E. coli UTI.  Completed the course of antibiotics.  Acute urinary retention.  Patient developed acute urinary retention with abdominal pain yesterday requiring Foley catheter. -Continue to monitor-we will remove Foley catheter once some improvement in mental status.  Nursing concern of aspiration.  Most likely secondary to encephalopathy.  CT head was negative for any acute changes. -Keep  her n.p.o.  till cleared from swallow team-Unable to obtain formal swallow evaluation due to AMS, should try again once more responsive. -Insert Ng tube and start tube feed. -Patient will be high risk for refeeding syndrome-monitor electrolyte along with magnesium and phosphorus daily.  Schizophrenia.  Patient lives in a group home.  Lithium within therapeutic range. -Continue antipsychotics. -Decrease the dose of trazodone due to increased lethargy.  Neuropathy.  Her home dose of gabapentin is being held due to increased lethargy and altered mental status.  Hypothyroidism.  Due to TSH in hyperthyroid range her home dose of Synthroid was decreased during current hospitalization. -Continue Synthroid-patient will need a repeat TSH in 4 to 6 weeks.  Physical deconditioning/generalized weakness.  PT was ordered but she is unable to participate due to her mental status.  Objective: Vitals:   11/27/19 2106 11/27/19 2340 11/28/19 0441 11/28/19 0754  BP: 106/68 110/73 108/66 108/77  Pulse: (!) 110 (!) 106 (!) 110 (!) 120  Resp: 20 20 19 18   Temp: 97.6 F (36.4 C) 98 F (36.7 C) 97.9 F (36.6 C) 98.9 F (37.2 C)  TempSrc: Oral Oral Oral Oral  SpO2: 92% 94% 95% 94%  Weight:      Height:        Intake/Output Summary (Last 24 hours) at 11/28/2019 0835 Last data filed at 11/28/2019 0547 Gross per 24 hour  Intake 1701.28 ml  Output 1775 ml  Net -73.72 ml   Filed Weights   11/22/19 1933  Weight: 74.8 kg    Examination:  General. Chronically ill appearing, lethargic lady, In no acute distress. Pulmonary.  Lungs clear bilaterally, normal respiratory effort. CV.  Regular rate and rhythm, no JVD, rub or murmur. Abdomen.  Soft, nontender, nondistended, BS positive. CNS.  Lethargic, just open eyes on command. Not following any other commands. Extremities.  No edema, no cyanosis, pulses intact and symmetrical. Psychiatry.  Judgment and insight appears impaired.  DVT prophylaxis: Lovenox Code Status:  Full Family Communication: Mother was updated on phone. Disposition Plan:  Status is: Inpatient  Remains inpatient appropriate because:Inpatient level of care appropriate due to severity of illness   Dispo: The patient is from: Group home              Anticipated d/c is to: To be determined              Anticipated d/c date is: 2-3 days.              Patient currently is not medically stable to d/c.  Patient with worsening mental status.  High risk for deterioration and death.  Mother would like to keep her full code at this time and okay to proceed with NG tube feeding.  Palliative care is involved.   Consultants:   Palliative  Procedures:   Antimicrobials:   Cefepime Azithromax  Data Reviewed: I have personally reviewed following labs and imaging studies  CBC: Recent Labs  Lab 11/23/19 0400 11/24/19 0521 11/25/19 0624 11/26/19 0609 11/27/19 0532  WBC 6.0 8.3 5.8 6.5 21.0*  NEUTROABS 5.1 7.1 4.3 5.0 17.8*  HGB 11.0* 11.3* 12.7 15.1* 16.1*  HCT 36.2 37.9 41.8 50.0* 53.0*  MCV 87.0 88.3 86.9 88.2 87.7  PLT 235 277 317 373 419*   Basic Metabolic Panel: Recent Labs  Lab 11/22/19 1945 11/22/19 2216 11/23/19 0400 11/24/19 0521 11/25/19 0624 11/26/19 0609 11/27/19 0532  NA 140   < > 142 145 149* 149* 156*  K 3.8   < >  4.3 4.8 5.0 4.9 4.6  CL 109   < > 111 115* 117* 112* 119*  CO2 19*   < > 18* 22 25 24  21*  GLUCOSE 137*   < > 222* 279* 254* 377* 305*  BUN 33*   < > 35* 40* 45* 58* 82*  CREATININE 1.95*   < > 1.94* 1.63* 1.76* 2.00* 2.66*  CALCIUM 9.7   < > 9.7 10.3 10.8* 11.7* 12.1*  MG 2.3  --   --   --   --   --   --    < > = values in this interval not displayed.   GFR: Estimated Creatinine Clearance: 22.1 mL/min (A) (by C-G formula based on SCr of 2.66 mg/dL (H)). Liver Function Tests: Recent Labs  Lab 11/23/19 0400 11/24/19 0521 11/25/19 0624 11/26/19 0609 11/27/19 0532  AST 20 19 17  14* 13*  ALT 13 15 13 13 10   ALKPHOS 75 74 77 89 93  BILITOT  0.9 0.6 0.6 0.7 0.8  PROT 6.6 6.3* 7.1 8.6* 8.3*  ALBUMIN 3.0* 2.8* 3.0* 3.7 3.4*   No results for input(s): LIPASE, AMYLASE in the last 168 hours. No results for input(s): AMMONIA in the last 168 hours. Coagulation Profile: No results for input(s): INR, PROTIME in the last 168 hours. Cardiac Enzymes: No results for input(s): CKTOTAL, CKMB, CKMBINDEX, TROPONINI in the last 168 hours. BNP (last 3 results) No results for input(s): PROBNP in the last 8760 hours. HbA1C: No results for input(s): HGBA1C in the last 72 hours. CBG: Recent Labs  Lab 11/27/19 0824 11/27/19 1159 11/27/19 1637 11/27/19 2102 11/28/19 0824  GLUCAP 298* 312* 177* 95 184*   Lipid Profile: No results for input(s): CHOL, HDL, LDLCALC, TRIG, CHOLHDL, LDLDIRECT in the last 72 hours. Thyroid Function Tests: Recent Labs    11/26/19 1007  TSH 0.119*   Anemia Panel: Recent Labs    11/26/19 0609 11/27/19 0532  FERRITIN 60 61   Sepsis Labs: Recent Labs  Lab 11/22/19 1945 11/22/19 2216 11/25/19 0624 11/26/19 0609 11/27/19 0532 11/27/19 1657 11/27/19 1935 11/28/19 0644  PROCALCITON  --    < > 0.89 1.08 1.34  --   --  1.09  LATICACIDVEN 0.9  --   --   --   --  1.6 1.3  --    < > = values in this interval not displayed.    Recent Results (from the past 240 hour(s))  SARS Coronavirus 2 by RT PCR (hospital order, performed in [facility] hospital lab) Nasopharyngeal Nasopharyngeal Swab     Status: Abnormal   Collection Time: 11/22/19  7:45 PM   Specimen: Nasopharyngeal Swab  Result Value Ref Range Status   SARS Coronavirus 2 POSITIVE (A) NEGATIVE Final    Comment: RESULT CALLED TO, READ BACK BY AND VERIFIED WITH: CHRIS BUCKNER 11/22/19 AT 2048 HS (NOTE) SARS-CoV-2 target nucleic acids are DETECTED  SARS-CoV-2 RNA is generally detectable in upper respiratory specimens  during the acute phase of infection.  Positive results are indicative  of the presence of the identified virus, but do not rule  out bacterial infection or co-infection with other pathogens not detected by the test.  Clinical correlation with patient history and  other diagnostic information is necessary to determine patient infection status.  The expected result is negative.  Fact Sheet for Patients:   StrictlyIdeas.no   Fact Sheet for Healthcare Providers:   BankingDealers.co.za    This test is not yet approved or cleared by the  [place] Architectural technologist and  has been authorized for detection and/or diagnosis of SARS-CoV-2 by FDA under an Print production planner (EUA).  This EUA will remain in effect (meaning this test  can be used) for the duration of  the COVID-19 declaration under Section 564(b)(1) of the Act, 21 U.S.C. section 360-bbb-3(b)(1), unless the authorization is terminated or revoked sooner.  Performed at [facility], [address] [place], [place] [address]   Urine Culture     Status: Abnormal   Collection Time: 11/22/19 11:25 PM   Specimen: Urine, Random  Result Value Ref Range Status   Specimen Description   Final    URINE, RANDOM Performed at [facility], [address] [place], [place] [address]    Special Requests   Final    NONE Performed at [facility], [address] [place], [place] [address]    Culture >=100,000 COLONIES/mL ESCHERICHIA COLI (A)  Final   Report Status 11/26/2019 FINAL  Final   Organism ID, Bacteria ESCHERICHIA COLI (A)  Final      Susceptibility   Escherichia coli - MIC*    AMPICILLIN <=2 SENSITIVE Sensitive     CEFAZOLIN <=4 SENSITIVE Sensitive     CEFTRIAXONE <=0.25 SENSITIVE Sensitive     CIPROFLOXACIN <=0.25 SENSITIVE Sensitive     GENTAMICIN <=1 SENSITIVE Sensitive     IMIPENEM <=0.25 SENSITIVE Sensitive     NITROFURANTOIN 64 INTERMEDIATE Intermediate     TRIMETH/SULFA <=20 SENSITIVE Sensitive     AMPICILLIN/SULBACTAM <=2 SENSITIVE Sensitive     PIP/TAZO <=4 SENSITIVE  Sensitive     * >=100,000 COLONIES/mL ESCHERICHIA COLI  MRSA PCR Screening     Status: None   Collection Time: 11/23/19 11:26 PM   Specimen: Nasal Mucosa; Nasopharyngeal  Result Value Ref Range Status   MRSA by PCR NEGATIVE NEGATIVE Final    Comment:        The GeneXpert MRSA Assay (FDA approved for NASAL specimens only), is one component of a comprehensive MRSA colonization surveillance program. It is not intended to diagnose MRSA infection nor to guide or monitor treatment for MRSA infections. Performed at [facility], [address] [place], [place] [address]   CULTURE, BLOOD (ROUTINE X 2) w Reflex to ID Panel     Status: None (Preliminary result)   Collection Time: 11/27/19  4:57 PM   Specimen: BLOOD  Result Value Ref Range Status   Specimen Description BLOOD RAC  Final   Special Requests   Final    BOTTLES DRAWN AEROBIC AND ANAEROBIC Blood Culture adequate volume   Culture   Final    NO GROWTH < 24 HOURS Performed at [facility], [address] [place], [place] [address]    Report Status PENDING  Incomplete  CULTURE, BLOOD (ROUTINE X 2) w Reflex to ID Panel     Status: None (Preliminary result)   Collection Time: 11/27/19  5:01 PM   Specimen: BLOOD  Result Value Ref Range Status   Specimen Description BLOOD [facility]  Final   Special Requests   Final    BOTTLES DRAWN AEROBIC AND ANAEROBIC Blood Culture adequate volume   Culture   Final    NO GROWTH < 24 HOURS Performed at [facility], [address], [place],  [address]    Report Status PENDING  Incomplete     Radiology Studies: CT HEAD WO CONTRAST  Result Date: 11/27/2019 CLINICAL DATA:  Admitted 5 days ago for COVID-19 and altered  level of consciousness, worsening mental status EXAM: CT HEAD WITHOUT CONTRAST TECHNIQUE: Contiguous axial images were obtained from the base of the skull through the vertex without intravenous contrast. COMPARISON:  11/22/2019 FINDINGS: Brain: No  acute infarct or hemorrhage. Lateral ventricles and midline structures are unremarkable. No acute extra-axial fluid collections. No mass effect. Vascular: No hyperdense vessel or unexpected calcification. Skull: Normal. Negative for fracture or focal lesion. Sinuses/Orbits: No acute finding. Other: None. IMPRESSION: 1. Stable head CT, no acute process. Electronically Signed   By: Randa Ngo M.D.   On: 11/27/2019 15:35   DG Chest Port 1 View  Result Date: 11/26/2019 CLINICAL DATA:  COVID-19 positivity with hypoxia EXAM: PORTABLE CHEST 1 VIEW COMPARISON:  11/22/2019 FINDINGS: Cardiac shadow is mildly enlarged but stable. Increasing airspace opacity is noted throughout both lungs accentuated by poor inspiratory effort but likely representing progressive involvement in COVID-19 pneumonia. No sizable effusion is seen. No bony abnormality is noted. IMPRESSION: Changes consistent with the given clinical history of COVID-19 pneumonia which have increased in the interval from the prior exam. Portion of this may be accentuated by poor inspiratory effort. Electronically Signed   By: Inez Catalina M.D.   On: 11/26/2019 15:05    Scheduled Meds: . albuterol  2 puff Inhalation Q6H  . ARIPiprazole  30 mg Oral QHS  . vitamin C  500 mg Oral Daily  . aspirin  81 mg Oral Daily  . baricitinib  2 mg Oral Daily  . benztropine  0.5 mg Oral BID  . Chlorhexidine Gluconate Cloth  6 each Topical Daily  . cholecalciferol  1,000 Units Oral Daily  . diltiazem  30 mg Oral Q6H  . enoxaparin (LOVENOX) injection  30 mg Subcutaneous Q24H  . fluticasone  2 spray Each Nare Daily  . insulin aspart  0-20 Units Subcutaneous TID WC  . insulin aspart  0-5 Units Subcutaneous QHS  . insulin detemir  10 Units Subcutaneous QHS  . levothyroxine  25 mcg Oral QAC breakfast  . lithium carbonate  300 mg Oral Q12H  . liver oil-zinc oxide   Topical TID  . mouth rinse  15 mL Mouth Rinse BID  . methylPREDNISolone (SOLU-MEDROL) injection  40 mg  Intravenous Q12H  . pantoprazole (PROTONIX) IV  40 mg Intravenous QHS  . polyethylene glycol  17 g Oral Daily  . senna  1 tablet Oral QHS  . sertraline  100 mg Oral q AM  . traZODone  50 mg Oral QHS  . zinc sulfate  220 mg Oral Daily   Continuous Infusions: . sodium chloride 100 mL/hr at 11/27/19 0909  . azithromycin 500 mg (11/27/19 1716)  . ceFEPime (MAXIPIME) IV 2 g (11/27/19 2330)     LOS: 6 days   Time spent: 40 minutes.  Lorella Nimrod, MD [facility]  If 7PM-7AM, please contact night-coverage [email or web address]  11/28/2019, 8:35 AM   This record has been created using Systems analyst. Errors have been sought and corrected,but may not always be located. Such creation errors do not reflect on the standard of care.

## 2019-11-28 NOTE — Progress Notes (Signed)
Initial Nutrition Assessment  DOCUMENTATION CODES:   Non-severe (moderate) malnutrition in context of chronic illness  INTERVENTION:  Initiate Jevity 1.5 Cal at 25 mL/hr and advance by 15 mL/hr every 8 hours to goal rate of 55 mL/hr per tube. Also provide PROSource TF 45 mL BID per tube. Provides 2060 kcal, 106 grams of protein, 1003 mL H2O daily.  With free water flush of 250 mL Q2hrs patient will receive a total of 4003 mL H2O daily including water in tube feeding.  Monitor magnesium, potassium, and phosphorus daily for at least 3 days, MD to replete as needed, as pt is at risk for refeeding syndrome.  NUTRITION DIAGNOSIS:   Moderate Malnutrition related to chronic illness (CHF, suspected inadequate oral intake) as evidenced by mild fat depletion, mild muscle depletion, moderate muscle depletion, severe muscle depletion.  GOAL:   Patient will meet greater than or equal to 90% of their needs  MONITOR:   Diet advancement, Labs, Weight trends, TF tolerance, I & O's  REASON FOR ASSESSMENT:   Consult Enteral/tube feeding initiation and management  ASSESSMENT:   59 year old female with PMHx of DM, hypothyroidism, CAD, CHF, cognitive impairment, HTN, paranoid schizophrenia admitted with COVID-19 PNA, acute metabolic encephalopathy, AKI.   Met with patient at bedside but she was unable to provide any history. Patient was lethargic and only moaned in response to questions. Patient's RN was at lunch but discussed with another RN on unit. Patient has had poor PO intake due to lethargy this admission. She was made NPO due to concern for aspiration yesterday. Plan is for placement of NGT for initiation of tube feeds today. Failed earlier attempt at placement of sump-style NGT. RD called to have Dobbhoff tube brought to unit as there were none available in supply closet and plan is to attempt placement of Dobbhoff tube.   Discussed with SLP. Patient too lethargic for safe PO intake at this  time.   Per review of chart Palliative Medicine has been following patient. Patient's mother would like a trial of temporary feeding/hydration to see if it can perk patient up.  Per review of chart patient was 98.4 kg on 09/21/2019. She is now documented to be 74.8 kg (165 lbs) but unsure if this weight is accurate. If current weight is accurate patient has lost 23.6 kg (24% body weight) over the past 2 months. Suspect this amount of weight loss would not have occurred in such a short period of time.  Medications reviewed and include: vitamin C 500 mg daily, vitamin D3 1000 units daily, Novolog 0-20 units TID, Novolog 0-5 units QHS, Levemir 10 units QHS, levothyroxine, Solu-Medrol 40 mg Q12hrs IV, Protonix, Miralax, senna, zinc sulfate 220 mg daily, 1/2 NS at 125 mL/hr, azithromycin, cefepime.  Labs reviewed: CBG 95-236, Sodium 159, CO2 19, BUN 96, Creatinine 2.94.  Enteral Access: Dobbhoff tube placed today by RN in left nare; terminates in stomach per chest x-ray today  NUTRITION - FOCUSED PHYSICAL EXAM:    Most Recent Value  Orbital Region Mild depletion  Upper Arm Region Moderate depletion  Thoracic and Lumbar Region No depletion  Buccal Region Mild depletion  Temple Region Moderate depletion  Clavicle Bone Region Mild depletion  Clavicle and Acromion Bone Region Moderate depletion  Scapular Bone Region No depletion  Dorsal Hand Moderate depletion  Patellar Region Severe depletion  Anterior Thigh Region Severe depletion  Posterior Calf Region Severe depletion  Edema (RD Assessment) None  Hair Reviewed  Eyes Reviewed  Mouth Reviewed  Skin Reviewed  Nails Reviewed     Diet Order:   Diet Order            Diet NPO time specified  Diet effective now                EDUCATION NEEDS:   No education needs have been identified at this time  Skin:  Skin Assessment: Reviewed RN Assessment  Last BM:  11/25/2019 per chart  Height:   Ht Readings from Last 1 Encounters:   11/22/19 $RemoveB'5\' 3"'[id number]$  (1.6 m)   Weight:   Wt Readings from Last 1 Encounters:  11/22/19 74.8 kg   Ideal Body Weight:  52.3 kg  BMI:  Body mass index is 29.23 kg/m.  Estimated Nutritional Needs:   Kcal:  1900-2100  Protein:  100-110 grams  Fluid:  1.9-2.1 L/day  Jacklynn Barnacle, MS, RD, LDN Pager number available on Amion

## 2019-11-28 NOTE — Progress Notes (Signed)
Palliative: [name] is Covid positive, thorough chart review completed.  She has been hospitalized for 5 days at this point, and has shown significant decline since admissions.  It seems that upon admit she was able to communicate effectively, make her needs known, participate in her care.  Per bedside nursing staff, at this point today she has no meaningful interactions, and did not withdrawal to blood sugar checks.  She is clearly unable to make her basic needs known.  Due to Covid status and visitor restrictions, there is no family at bedside at this time.  Call to mother/legal guardian, Elizabeth Gutierrez.  Blanch Media and I talked about Elizabeth Gutierrez's declines over the last few days.  She tells me that [name] has "aged a lot in the last few years".  She also shares that the past year has been very hard for Elizabeth Gutierrez and that they cannot spend time together due to Covid.  We talked in detail about her poor by mouth intake, her lethargy.  Mother Blanch Media states, "I want her to be here" but endorses that that is for herself, not for Elizabeth Gutierrez.  She states Elizabeth Gutierrez always "left life" and cannot imagine that Elizabeth Gutierrez would not want to continue living.  We again talked about Elizabeth Gutierrez's current condition.  We talked in detail about CODE STATUS.  I share my concern that if we are doing all these things for Elizabeth Gutierrez, and still she gets worse, her heart and breathing naturally stop, I worry that CPR and intubation would not change anything and that she may suffer.  I greatly encouraged Blanch Media to consider "treat the treatable, but allowing natural death".  I asked her also to consider that if she does choose intubation/life support for Elizabeth Gutierrez, how long?  2 days, 2 weeks, forever?  At this point, Blanch Media is unable to set any limits on care.  We talked in detail about nutrition, speech therapy consult, temporary NG feeding tube versus PEG tube.  Blanch Media states that she would like a trial of temporary feeding/hydration to see if this can perk Elizabeth Gutierrez up.  We talked  about setting limited time trials.  Conference with attending, bedside nursing staff, transition of care team related to patient condition, needs, goals of care, CODE STATUS discussions.  Plan:   At this point full scope/full code.  Temporary feeding tube.     55 minutes  Elizabeth Axe, NP Palliative medicine team Team phone [phone] Greater than 50% of this time spent counseling and coordinating care related to the above assessment and plan.

## 2019-11-28 NOTE — Progress Notes (Signed)
SLP Cancellation Note  Patient Details Name: [name] MRN: [id number] DOB: [birth year]   Cancelled treatment:       Reason Eval/Treat Not Completed: Fatigue/lethargy limiting ability to participate;Patient's level of consciousness;Patient not medically ready   Per chart review it appears that pt has been experiencing a decline over the course of her admission. Per pt's nurse, pt responded to her name but unable to open eyes d/t lethargy. PO intake is not safe given pt's condition of continued decreased alertness. Per chart, pt's PO intake has been minimal through course of hospitalization. Given current medical condition, she appears at high risk of aspiration and decreased ability to sustain nutrition/hydration. Secure chat sent to Dr. Reesa Chew detailing this information.  Please re-consult ST when pt's status improves and she is able to maintain alertness.   Ailah Barna B. Rutherford Nail M.S., CCC-SLP, [address] Office [phone]   Stormy Fabian 11/28/2019, 9:31 AM

## 2019-11-29 ENCOUNTER — Inpatient Hospital Stay: Payer: Medicare Other

## 2019-11-29 DIAGNOSIS — J9601 Acute respiratory failure with hypoxia: Secondary | ICD-10-CM

## 2019-11-29 LAB — COMPREHENSIVE METABOLIC PANEL
ALT: 6 U/L (ref 0–44)
AST: 10 U/L — ABNORMAL LOW (ref 15–41)
Albumin: 2.7 g/dL — ABNORMAL LOW (ref 3.5–5.0)
Alkaline Phosphatase: 70 U/L (ref 38–126)
Anion gap: 11 (ref 5–15)
BUN: 85 mg/dL — ABNORMAL HIGH (ref 6–20)
CO2: 23 mmol/L (ref 22–32)
Calcium: 11.7 mg/dL — ABNORMAL HIGH (ref 8.9–10.3)
Chloride: 128 mmol/L — ABNORMAL HIGH (ref 98–111)
Creatinine, Ser: 2.5 mg/dL — ABNORMAL HIGH (ref 0.44–1.00)
GFR calc Af Amer: 24 mL/min — ABNORMAL LOW (ref >60)
GFR calc non Af Amer: 20 mL/min — ABNORMAL LOW (ref >60)
Glucose, Bld: 224 mg/dL — ABNORMAL HIGH (ref 70–99)
Potassium: 4.3 mmol/L (ref 3.5–5.1)
Sodium: 162 mmol/L (ref 135–145)
Total Bilirubin: 1 mg/dL (ref 0.3–1.2)
Total Protein: 6.5 g/dL (ref 6.5–8.1)

## 2019-11-29 LAB — BLOOD GAS, ARTERIAL
Acid-base deficit: 5.4 mmol/L — ABNORMAL HIGH (ref 0.0–2.0)
Bicarbonate: 20 mmol/L (ref 20.0–28.0)
O2 Saturation: 92.7 %
Patient temperature: 37
pCO2 arterial: 38 mmHg (ref 32.0–48.0)
pH, Arterial: 7.33 — ABNORMAL LOW (ref 7.350–7.450)
pO2, Arterial: 71 mmHg — ABNORMAL LOW (ref 83.0–108.0)

## 2019-11-29 LAB — BASIC METABOLIC PANEL
Anion gap: 10 (ref 5–15)
Anion gap: 4 — ABNORMAL LOW (ref 5–15)
BUN: 77 mg/dL — ABNORMAL HIGH (ref 6–20)
BUN: 67 mg/dL — ABNORMAL HIGH (ref 6–20)
CO2: 21 mmol/L — ABNORMAL LOW (ref 22–32)
CO2: 20 mmol/L — ABNORMAL LOW (ref 22–32)
Calcium: 11.4 mg/dL — ABNORMAL HIGH (ref 8.9–10.3)
Calcium: 10.1 mg/dL (ref 8.9–10.3)
Chloride: 124 mmol/L — ABNORMAL HIGH (ref 98–111)
Chloride: 125 mmol/L — ABNORMAL HIGH (ref 98–111)
Creatinine, Ser: 2.1 mg/dL — ABNORMAL HIGH (ref 0.44–1.00)
Creatinine, Ser: 1.84 mg/dL — ABNORMAL HIGH (ref 0.44–1.00)
GFR calc Af Amer: 29 mL/min — ABNORMAL LOW (ref >60)
GFR calc Af Amer: 34 mL/min — ABNORMAL LOW (ref >60)
GFR calc non Af Amer: 25 mL/min — ABNORMAL LOW (ref >60)
GFR calc non Af Amer: 29 mL/min — ABNORMAL LOW (ref >60)
Glucose, Bld: 301 mg/dL — ABNORMAL HIGH (ref 70–99)
Glucose, Bld: 336 mg/dL — ABNORMAL HIGH (ref 70–99)
Potassium: 3.9 mmol/L (ref 3.5–5.1)
Potassium: 3.3 mmol/L — ABNORMAL LOW (ref 3.5–5.1)
Sodium: 155 mmol/L — ABNORMAL HIGH (ref 135–145)
Sodium: 149 mmol/L — ABNORMAL HIGH (ref 135–145)

## 2019-11-29 LAB — CBC
HCT: 51.3 % — ABNORMAL HIGH (ref 36.0–46.0)
Hemoglobin: 14.9 g/dL (ref 12.0–15.0)
MCH: 26.2 pg (ref 26.0–34.0)
MCHC: 29 g/dL — ABNORMAL LOW (ref 30.0–36.0)
MCV: 90.3 fL (ref 80.0–100.0)
Platelets: 477 10*3/uL — ABNORMAL HIGH (ref 150–400)
RBC: 5.68 MIL/uL — ABNORMAL HIGH (ref 3.87–5.11)
RDW: 18.3 % — ABNORMAL HIGH (ref 11.5–15.5)
WBC: 26.2 10*3/uL — ABNORMAL HIGH (ref 4.0–10.5)
nRBC: 0.1 % (ref 0.0–0.2)

## 2019-11-29 LAB — GLUCOSE, CAPILLARY
Glucose-Capillary: 188 mg/dL — ABNORMAL HIGH (ref 70–99)
Glucose-Capillary: 153 mg/dL — ABNORMAL HIGH (ref 70–99)
Glucose-Capillary: 161 mg/dL — ABNORMAL HIGH (ref 70–99)
Glucose-Capillary: 242 mg/dL — ABNORMAL HIGH (ref 70–99)
Glucose-Capillary: 292 mg/dL — ABNORMAL HIGH (ref 70–99)
Glucose-Capillary: 284 mg/dL — ABNORMAL HIGH (ref 70–99)
Glucose-Capillary: 258 mg/dL — ABNORMAL HIGH (ref 70–99)
Glucose-Capillary: 328 mg/dL — ABNORMAL HIGH (ref 70–99)
Glucose-Capillary: 350 mg/dL — ABNORMAL HIGH (ref 70–99)

## 2019-11-29 LAB — MRSA PCR SCREENING: MRSA by PCR: NEGATIVE

## 2019-11-29 LAB — LITHIUM LEVEL: Lithium Lvl: 0.44 mmol/L — ABNORMAL LOW (ref 0.60–1.20)

## 2019-11-29 LAB — PROCALCITONIN: Procalcitonin: 0.86 ng/mL

## 2019-11-29 LAB — PHOSPHORUS: Phosphorus: 4.5 mg/dL (ref 2.5–4.6)

## 2019-11-29 LAB — MAGNESIUM: Magnesium: 2.8 mg/dL — ABNORMAL HIGH (ref 1.7–2.4)

## 2019-11-29 MED ORDER — ASPIRIN 81 MG PO CHEW
81.0000 mg | CHEWABLE_TABLET | Freq: Every day | ORAL | Status: DC
  Administered 2019-11-29 – 2019-12-07 (×9): 81 mg
  Filled 2019-11-29 (×8): qty 1

## 2019-11-29 MED ORDER — ARIPIPRAZOLE 15 MG PO TABS
30.0000 mg | ORAL_TABLET | Freq: Every day | ORAL | Status: DC
  Administered 2019-11-29 – 2019-12-06 (×8): 30 mg
  Filled 2019-11-29 (×10): qty 2

## 2019-11-29 MED ORDER — DEXTROSE 50 % IV SOLN: 0.0000 mL | INTRAVENOUS | Status: DC | PRN

## 2019-11-29 MED ORDER — DILTIAZEM HCL 30 MG PO TABS
30.0000 mg | ORAL_TABLET | Freq: Four times a day (QID) | ORAL | Status: DC
  Administered 2019-11-29 – 2019-12-03 (×17): 30 mg
  Filled 2019-11-29 (×16): qty 1

## 2019-11-29 MED ORDER — DEXTROSE 5 % IV SOLN: INTRAVENOUS | Status: DC

## 2019-11-29 MED ORDER — INSULIN ASPART 100 UNIT/ML ~~LOC~~ SOLN
0.0000 [IU] | SUBCUTANEOUS | Status: DC
  Administered 2019-11-29: 7 [IU] via SUBCUTANEOUS
  Administered 2019-11-29: 15 [IU] via SUBCUTANEOUS
  Administered 2019-11-29 (×2): 4 [IU] via SUBCUTANEOUS
  Administered 2019-11-29: 11 [IU] via SUBCUTANEOUS
  Administered 2019-11-30: 7 [IU] via SUBCUTANEOUS
  Administered 2019-11-30: 11 [IU] via SUBCUTANEOUS
  Administered 2019-11-30: 15 [IU] via SUBCUTANEOUS
  Administered 2019-11-30: 20 [IU] via SUBCUTANEOUS
  Administered 2019-11-30 (×2): 15 [IU] via SUBCUTANEOUS
  Administered 2019-11-30: 20 [IU] via SUBCUTANEOUS
  Administered 2019-12-01 (×2): 15 [IU] via SUBCUTANEOUS
  Administered 2019-12-01: 7 [IU] via SUBCUTANEOUS
  Administered 2019-12-01: 20 [IU] via SUBCUTANEOUS
  Administered 2019-12-01: 11 [IU] via SUBCUTANEOUS
  Filled 2019-11-29 (×17): qty 1

## 2019-11-29 MED ORDER — AZITHROMYCIN 500 MG PO TABS
500.0000 mg | ORAL_TABLET | Freq: Once | ORAL | Status: AC
  Administered 2019-11-29: 500 mg
  Filled 2019-11-29: qty 1

## 2019-11-29 MED ORDER — LEVOTHYROXINE SODIUM 25 MCG PO TABS
25.0000 ug | ORAL_TABLET | Freq: Every day | ORAL | Status: DC
  Administered 2019-11-29 – 2019-12-07 (×9): 25 ug
  Filled 2019-11-29 (×8): qty 1

## 2019-11-29 MED ORDER — ASCORBIC ACID 500 MG PO TABS
500.0000 mg | ORAL_TABLET | Freq: Every day | ORAL | Status: DC
  Administered 2019-11-29 – 2019-12-07 (×10): 500 mg
  Filled 2019-11-29 (×8): qty 1

## 2019-11-29 MED ORDER — INSULIN REGULAR(HUMAN) IN NACL 100-0.9 UT/100ML-% IV SOLN: INTRAVENOUS | Status: DC

## 2019-11-29 MED ORDER — INSULIN ASPART 100 UNIT/ML ~~LOC~~ SOLN: 0.0000 [IU] | SUBCUTANEOUS | Status: DC

## 2019-11-29 MED ORDER — BENZTROPINE MESYLATE 0.5 MG PO TABS
0.5000 mg | ORAL_TABLET | Freq: Two times a day (BID) | ORAL | Status: DC
  Administered 2019-11-29 – 2019-12-06 (×14): 0.5 mg
  Filled 2019-11-29 (×16): qty 1

## 2019-11-29 MED ORDER — SERTRALINE HCL 50 MG PO TABS
100.0000 mg | ORAL_TABLET | Freq: Every morning | ORAL | Status: DC
  Administered 2019-11-29 – 2019-12-07 (×9): 100 mg
  Filled 2019-11-29 (×8): qty 2

## 2019-11-29 MED ORDER — SENNA 8.6 MG PO TABS
1.0000 | ORAL_TABLET | Freq: Every day | ORAL | Status: DC
  Administered 2019-11-29 – 2019-12-06 (×6): 8.6 mg
  Filled 2019-11-29 (×5): qty 1

## 2019-11-29 MED ORDER — VITAMIN D 25 MCG (1000 UNIT) PO TABS
1000.0000 [IU] | ORAL_TABLET | Freq: Every day | ORAL | Status: DC
  Administered 2019-11-29 – 2019-12-07 (×9): 1000 [IU]
  Filled 2019-11-29 (×8): qty 1

## 2019-11-29 NOTE — Consult Note (Signed)
Pharmacy Antibiotic Note  Elizabeth Gutierrez is a 59 y.o. female admitted on 11/22/2019 with altered mental status and hypoxia. Pt is from a group home. Pt is COVID positive since 8/22. PMH includes CHF, CAD, diabetes, HTN, HLD, schizophrenia, and thyroid disease. Pharmacy has been consulted for cefepime dosing for healthcare-acquired pneumonia.  Plan: cefepime 2 g q24H  Height: 5\' 3"  (160 cm) Weight: 74.8 kg (165 lb) IBW/kg (Calculated) : 52.4  Temp (24hrs), Avg:98.2 F (36.8 C), Min:97.6 F (36.4 C), Max:98.7 F (37.1 C)  Recent Labs  Lab 11/22/19 1945 11/22/19 2216 11/24/19 0521 11/24/19 0521 11/25/19 0624 11/26/19 0609 11/27/19 0532 11/27/19 1657 11/27/19 1935 11/28/19 0644 11/29/19 0515  WBC 7.1   < > 8.3  --  5.8 6.5 21.0*  --   --   --  26.2*  CREATININE 1.95*   < > 1.63*   < > 1.76* 2.00* 2.66*  --   --  2.94* 2.50*  LATICACIDVEN 0.9  --   --   --   --   --   --  1.6 1.3  --   --    < > = values in this interval not displayed.    Estimated Creatinine Clearance: 23.5 mL/min (A) (by C-G formula based on SCr of 2.5 mg/dL (H)).    No Known Allergies  Antimicrobials this admission: 9/8 azithromycin >> 9/7 cefazolin >> 9/8 9/4 ceftriaxone >> 9/6 9/8 cefepime >>  Microbiology results: 9/8 BCx: NGTD 9/3 UCx: e. Coli  9/3 COVID PCR: positive 9/4 MRSA PCR: negative  Thank you for allowing pharmacy to be a part of this patient's care.  Rowland Lathe, PharmD Pharmacy Resident  11/29/2019 8:21 AM

## 2019-11-29 NOTE — Consult Note (Signed)
[address] [place], [place] [address] Phone [phone]. Fax [phone]  Date: 11/29/2019                  [name]  MRN: [id number]  DOB: May 17, 1960  Age / Sex: 59 y.o., female         PCP: Denton Lank, MD                 Service Requesting Consult: IM/ Lorella Nimrod, MD                 Reason for Consult: ARF            History of Present Illness: Patient is a 59 y.o. female who is a group home resident, with medical problems of CHF with EF of 30 to 35% from July 2021, coronary disease with history of MI, diabetes type 2, hypertension, hyperlipidemia, paranoid schizophrenia and thyroid disease who was admitted to [facility] on 11/22/2019 for evaluation of Confusion [R41.0] Hypoxia [R09.02] Acute respiratory failure with hypoxia ([place]) [J96.01] AKI (acute kidney injury) ([place]) [N17.9] Pneumonia due to COVID-19 virus [U07.1, J12.82] COVID-19 [U07.1]  She was diagnosed with COVID-19 infection originally on August 22.  She was found to have altered mental status at her group home and low oxygen saturation on room air therefore was referred to the ER for evaluation.  In the ED, she was found to be hypoxic, hypotensive and feverish.  She has bilateral heterogeneous airspace opacities consistent with COVID-19 pneumonia.  Hospital course complicated by multiple electrolyte abnormalities including sodium which has increased to 162 today, creatinine of 2.50, peaked at 2.94, elevated calcium of 11.7 today, elevated phosphorus as well as magnesium.  Low albumin of 2.7.  Medications: Outpatient medications: Medications Prior to Admission  Medication Sig Dispense Refill Last Dose  . ARIPiprazole (ABILIFY) 30 MG tablet Take 1 tablet (30 mg total) by mouth at bedtime. 30 tablet 0 11/21/2019 at 1900  . aspirin 81 MG tablet Take 81 mg by mouth daily.   11/22/2019 at 0700  . atorvastatin (LIPITOR) 20 MG tablet Take 20 mg by mouth at bedtime.    11/21/2019 at 1900  .  benztropine (COGENTIN) 0.5 MG tablet Take 0.5 mg by mouth 2 (two) times daily.   11/22/2019 at 1900  . carvedilol (COREG) 3.125 MG tablet Take 3.125 mg by mouth 2 (two) times daily.   11/22/2019 at 1900  . docusate sodium (COLACE) 100 MG capsule Take 100 mg by mouth 2 (two) times daily as needed for mild constipation.    11/22/2019 at 0700  . fluPHENAZine decanoate (PROLIXIN) 25 MG/ML injection Inject 31.25 mg into the muscle every 14 (fourteen) days.    Past Month at Unknown time  . fluticasone (FLONASE) 50 MCG/ACT nasal spray Place 2 sprays into both nostrils daily.    11/22/2019 at 0700  . furosemide (LASIX) 20 MG tablet Take 1 tablet (20 mg total) by mouth daily as needed for fluid or edema. 30 tablet 0 11/22/2019 at 0700  . gabapentin (NEURONTIN) 100 MG capsule Take 200 mg by mouth 3 (three) times daily.   11/22/2019 at 1900  . levothyroxine (SYNTHROID, LEVOTHROID) 75 MCG tablet Take 75 mcg by mouth daily before breakfast.   11/22/2019 at 0630  . lithium carbonate (LITHOBID) 300 MG CR tablet Take 300 mg by mouth every 12 (twelve) hours.    11/22/2019 at 1900  . metFORMIN (GLUCOPHAGE-XR) 500 MG 24 hr tablet Take 1,000 mg by mouth  daily with breakfast.   11/22/2019 at 0700  . naproxen (NAPROSYN) 500 MG tablet Take 500 mg by mouth 2 (two) times daily with a meal.   11/22/2019 at 1700  . nystatin cream (MYCOSTATIN) Apply 1 application topically 4 (four) times daily.    11/22/2019 at 1900  . omeprazole (PRILOSEC) 20 MG capsule Take 20 mg by mouth 2 (two) times daily.    11/22/2019 at 1900  . senna (SENOKOT) 8.6 MG tablet Take 1 tablet by mouth at bedtime.    11/21/2019 at 1900  . sertraline (ZOLOFT) 100 MG tablet Take 100 mg by mouth in the morning.    11/22/2019 at 0700  . traZODone (DESYREL) 50 MG tablet Take 1 tablet (50 mg total) by mouth at bedtime. 30 tablet 0 11/21/2019 at 1900  . acetaminophen (TYLENOL) 325 MG tablet Take 650 mg by mouth every 4 (four) hours as needed for moderate pain, fever or headache.    prn at prn   . dextromethorphan-guaiFENesin (DIABETIC TUSSIN DM) 10-100 MG/5ML liquid Take by mouth every 4 (four) hours as needed for cough.   prn at prn  . polyethylene glycol (MIRALAX) 17 g packet Take 17 g by mouth daily.    prn at prn    Current medications: Current Facility-Administered Medications  Medication Dose Route Frequency Provider Last Rate Last Admin  . acetaminophen (TYLENOL) tablet 650 mg  650 mg Oral Q6H PRN Lorella Nimrod, MD      . albuterol (VENTOLIN HFA) 108 (90 Base) MCG/ACT inhaler 2 puff  2 puff Inhalation Q6H Lorella Nimrod, MD   2 puff at 11/29/19 0845  . ARIPiprazole (ABILIFY) tablet 30 mg  30 mg Oral QHS Lorella Nimrod, MD   30 mg at 11/25/19 2102  . ascorbic acid (VITAMIN C) tablet 500 mg  500 mg Oral Daily Lorella Nimrod, MD   500 mg at 11/25/19 0240  . aspirin chewable tablet 81 mg  81 mg Oral Daily Lorella Nimrod, MD   81 mg at 11/25/19 9735  . azithromycin (ZITHROMAX) 500 mg in sodium chloride 0.9 % 250 mL IVPB  500 mg Intravenous Q24H Lorella Nimrod, MD 250 mL/hr at 11/28/19 1819 Rate Verify at 11/28/19 1819  . benztropine (COGENTIN) tablet 0.5 mg  0.5 mg Oral BID Lorella Nimrod, MD   0.5 mg at 11/25/19 2103  . ceFEPIme (MAXIPIME) 2 g in sodium chloride 0.9 % 100 mL IVPB  2 g Intravenous Q24H Lorella Nimrod, MD 200 mL/hr at 11/28/19 2140 2 g at 11/28/19 2140  . Chlorhexidine Gluconate Cloth 2 % PADS 6 each  6 each Topical Daily Lorella Nimrod, MD   6 each at 11/28/19 0900  . chlorpheniramine-HYDROcodone (TUSSIONEX) 10-8 MG/5ML suspension 5 mL  5 mL Oral Q12H PRN Lorella Nimrod, MD      . cholecalciferol (VITAMIN D3) tablet 1,000 Units  1,000 Units Oral Daily Lorella Nimrod, MD      . dextromethorphan-guaiFENesin (ROBITUSSIN-DM) 10-100 MG/5ML liquid 5 mL  5 mL Oral Q4H PRN Lorella Nimrod, MD      . dextrose 5 % solution   Intravenous Continuous Lorella Nimrod, MD 150 mL/hr at 11/29/19 0831 New Bag at 11/29/19 0831  . dextrose 50 % solution 0-50 mL  0-50 mL Intravenous PRN Lorella Nimrod, MD      . diltiazem (CARDIZEM) tablet 30 mg  30 mg Oral Q6H Lorella Nimrod, MD   30 mg at 11/27/19 0553  . docusate sodium (COLACE) capsule 100 mg  100 mg Oral BID  PRN Lorella Nimrod, MD      . enoxaparin (LOVENOX) injection 30 mg  30 mg Subcutaneous Q24H Lorella Nimrod, MD   30 mg at 11/28/19 0857  . feeding supplement (JEVITY 1.5 CAL/FIBER) liquid 1,000 mL  1,000 mL Per Tube Continuous Lorella Nimrod, MD      . feeding supplement (PROSource TF) liquid 45 mL  45 mL Per Tube BID Lorella Nimrod, MD      . fluticasone (FLONASE) 50 MCG/ACT nasal spray 2 spray  2 spray Each Nare Daily Lorella Nimrod, MD   2 spray at 11/29/19 0846  . free water 250 mL  250 mL Per Tube Q2H Amin, Soundra Pilon, MD      . insulin aspart (novoLOG) injection 0-20 Units  0-20 Units Subcutaneous Q4H Lorella Nimrod, MD   4 Units at 11/29/19 [id number]  . insulin detemir (LEVEMIR) injection 10 Units  10 Units Subcutaneous QHS Lorella Nimrod, MD   10 Units at 11/28/19 2115  . insulin regular, human (MYXREDLIN) 100 units/ 100 mL infusion   Intravenous Continuous Lorella Nimrod, MD      . levothyroxine (SYNTHROID) tablet 25 mcg  25 mcg Oral QAC breakfast Lorella Nimrod, MD   25 mcg at 11/27/19 0554  . liver oil-zinc oxide (DESITIN) 40 % ointment   Topical TID Lorella Nimrod, MD   Given at 11/28/19 2135  . MEDLINE mouth rinse  15 mL Mouth Rinse BID Lorella Nimrod, MD   15 mL at 11/28/19 2134  . methylPREDNISolone sodium succinate (SOLU-MEDROL) 40 mg/mL injection 40 mg  40 mg Intravenous Q12H Lorella Nimrod, MD   40 mg at 11/29/19 0834  . metoprolol tartrate (LOPRESSOR) injection 5 mg  5 mg Intravenous Q1H PRN Lorella Nimrod, MD   2.5 mg at 11/26/19 2019  . pantoprazole (PROTONIX) injection 40 mg  40 mg Intravenous QHS Lorella Nimrod, MD   40 mg at 11/28/19 2132  . polyethylene glycol (MIRALAX / GLYCOLAX) packet 17 g  17 g Oral Daily Lorella Nimrod, MD   17 g at 11/25/19 0839  . senna (SENOKOT) tablet 8.6 mg  1 tablet Oral QHS Lorella Nimrod, MD   8.6  mg at 11/25/19 2103  . sertraline (ZOLOFT) tablet 100 mg  100 mg Oral q AM Lorella Nimrod, MD   100 mg at 11/25/19 0786  . zinc sulfate capsule 220 mg  220 mg Oral Daily Lorella Nimrod, MD   220 mg at 11/25/19 7544      Allergies: No Known Allergies    Past Medical History: Past Medical History:  Diagnosis Date  . CHF (congestive heart failure) ([place])   . Coronary artery disease   . Diabetes ([place])   . Heart attack ([place])   . Hyperlipidemia   . Hypertension   . MR (mental retardation)    Mild  . Paranoid schizophrenia ([place])   . Thyroid disease      Past Surgical History: Past Surgical History:  Procedure Laterality Date  . FOOT SURGERY Right      Family History: Family History  Problem Relation Age of Onset  . Alcohol abuse Father   . Breast cancer Neg Hx      Social History: Social History   Socioeconomic History  . Marital status: Single    Spouse name: Not on file  . Number of children: Not on file  . Years of education: Not on file  . Highest education level: Not on file  Occupational History  . Not on file  Tobacco Use  .  Smoking status: Former Smoker    Quit date: 07/08/2013    Years since quitting: 6.3  . Smokeless tobacco: Former Network engineer and Sexual Activity  . Alcohol use: No    Alcohol/week: 0.0 standard drinks  . Drug use: No  . Sexual activity: Not on file  Other Topics Concern  . Not on file  Social History Narrative  . Not on file   Social Determinants of Health   Financial Resource Strain:   . Difficulty of Paying Living Expenses: Not on file  Food Insecurity:   . Worried About Charity fundraiser in the Last Year: Not on file  . Ran Out of Food in the Last Year: Not on file  Transportation Needs:   . Lack of Transportation (Medical): Not on file  . Lack of Transportation (Non-Medical): Not on file  Physical Activity:   . Days of Exercise per Week: Not on file  . Minutes of Exercise per Session: Not on file  Stress:   .  Feeling of Stress : Not on file  Social Connections:   . Frequency of Communication with Friends and Family: Not on file  . Frequency of Social Gatherings with Friends and Family: Not on file  . Attends Religious Services: Not on file  . Active Member of Clubs or Organizations: Not on file  . Attends Archivist Meetings: Not on file  . Marital Status: Not on file  Intimate Partner Violence:   . Fear of Current or Ex-Partner: Not on file  . Emotionally Abused: Not on file  . Physically Abused: Not on file  . Sexually Abused: Not on file     Review of Systems: Not available as patient is lethargic Gen:  HEENT:  CV:  Resp:  GI: GU :  MS:  Derm:    Psych: Heme:  Neuro:  Endocrine  Vital Signs: Blood pressure 110/87, pulse (!) 44, temperature (!) 96.8 F (36 C), temperature source Axillary, resp. rate 17, height 5\' 3"  (1.6 m), weight 79.9 kg, SpO2 96 %.   Intake/Output Summary (Last 24 hours) at 11/29/2019 1136 Last data filed at 11/29/2019 0518 Gross per 24 hour  Intake 1491.76 ml  Output 1000 ml  Net 491.76 ml    Weight trends: Filed Weights   11/22/19 1933 11/29/19 1030  Weight: 74.8 kg 79.9 kg    Physical Exam: General:  Laying in the bed, lethargic, no acute distress  HEENT  anicteric, dry oral mucous membranes  Lungs:  Normal breathing effort, coarse breath sounds, [place] O2  Heart::  Regular, no rub  Abdomen:  Soft, nontender, nondistended  Extremities:  Trace edema  Neurologic:  Very lethargic, opens eyes to voice, follows very basic commands  Skin:  Decreased turgor, no acute rashes  Foley:  In place       Lab results: Basic Metabolic Panel: Recent Labs  Lab 11/22/19 1945 11/22/19 2216 11/27/19 0532 11/28/19 0644 11/29/19 0515  NA 140   < > 156* 159* 162*  K 3.8   < > 4.6 4.4 4.3  CL 109   < > 119* 123* 128*  CO2 19*   < > 21* 19* 23  GLUCOSE 137*   < > 305* 188* 224*  BUN 33*   < > 82* 96* 85*  CREATININE 1.95*   < > 2.66* 2.94*  2.50*  CALCIUM 9.7   < > 12.1* 11.7* 11.7*  MG 2.3  --   --   --  2.8*  PHOS  --   --   --   --  4.5   < > = values in this interval not displayed.    Liver Function Tests: Recent Labs  Lab 11/29/19 0515  AST 10*  ALT 6  ALKPHOS 70  BILITOT 1.0  PROT 6.5  ALBUMIN 2.7*   No results for input(s): LIPASE, AMYLASE in the last 168 hours. No results for input(s): AMMONIA in the last 168 hours.  CBC: Recent Labs  Lab 11/26/19 0609 11/26/19 0609 11/27/19 0532 11/29/19 0515  WBC 6.5   < > 21.0* 26.2*  NEUTROABS 5.0  --  17.8*  --   HGB 15.1*   < > 16.1* 14.9  HCT 50.0*   < > 53.0* 51.3*  MCV 88.2   < > 87.7 90.3  PLT 373   < > 539* 477*   < > = values in this interval not displayed.    Cardiac Enzymes: No results for input(s): CKTOTAL, TROPONINI in the last 168 hours.  BNP: Invalid input(s): POCBNP  CBG: Recent Labs  Lab 11/28/19 1945 11/28/19 2312 11/29/19 0423 11/29/19 0811 11/29/19 1024  GLUCAP 197* 104* 188* 153* 161*    Microbiology: Recent Results (from the past 720 hour(s))  SARS Coronavirus 2 by RT PCR (hospital order, performed in [facility] hospital lab) Nasopharyngeal Nasopharyngeal Swab     Status: Abnormal   Collection Time: 11/22/19  7:45 PM   Specimen: Nasopharyngeal Swab  Result Value Ref Range Status   SARS Coronavirus 2 POSITIVE (A) NEGATIVE Final    Comment: RESULT CALLED TO, READ BACK BY AND VERIFIED WITH: CHRIS BUCKNER 11/22/19 AT 2048 HS (NOTE) SARS-CoV-2 target nucleic acids are DETECTED  SARS-CoV-2 RNA is generally detectable in upper respiratory specimens  during the acute phase of infection.  Positive results are indicative  of the presence of the identified virus, but do not rule out bacterial infection or co-infection with other pathogens not detected by the test.  Clinical correlation with patient history and  other diagnostic information is necessary to determine patient infection status.  The expected result is  negative.  Fact Sheet for Patients:   StrictlyIdeas.no   Fact Sheet for Healthcare Providers:   BankingDealers.co.za    This test is not yet approved or cleared by the [place] FDA and  has been authorized for detection and/or diagnosis of SARS-CoV-2 by FDA under an Emergency Use Authorization (EUA).  This EUA will remain in effect (meaning this test  can be used) for the duration of  the COVID-19 declaration under Section 564(b)(1) of the Act, 21 U.S.C. section 360-bbb-3(b)(1), unless the authorization is terminated or revoked sooner.  Performed at [facility], [address], [place], [place] [address]   Urine Culture     Status: Abnormal   Collection Time: 11/22/19 11:25 PM   Specimen: Urine, Random  Result Value Ref Range Status   Specimen Description   Final    URINE, RANDOM Performed at [facility], [address], [place], [place] [address]    Special Requests   Final    NONE Performed at [facility], [address] [place], [place] [address]    Culture >=100,000 COLONIES/mL ESCHERICHIA COLI (A)  Final   Report Status 11/26/2019 FINAL  Final   Organism ID, Bacteria ESCHERICHIA COLI (A)  Final      Susceptibility   Escherichia coli - MIC*    AMPICILLIN <=2 SENSITIVE Sensitive     CEFAZOLIN <=4 SENSITIVE  Sensitive     CEFTRIAXONE <=0.25 SENSITIVE Sensitive     CIPROFLOXACIN <=0.25 SENSITIVE Sensitive     GENTAMICIN <=1 SENSITIVE Sensitive     IMIPENEM <=0.25 SENSITIVE Sensitive     NITROFURANTOIN 64 INTERMEDIATE Intermediate     TRIMETH/SULFA <=20 SENSITIVE Sensitive     AMPICILLIN/SULBACTAM <=2 SENSITIVE Sensitive     PIP/TAZO <=4 SENSITIVE Sensitive     * >=100,000 COLONIES/mL ESCHERICHIA COLI  MRSA PCR Screening     Status: None   Collection Time: 11/23/19 11:26 PM   Specimen: Nasal Mucosa; Nasopharyngeal  Result Value Ref Range Status   MRSA by PCR NEGATIVE NEGATIVE Final     Comment:        The GeneXpert MRSA Assay (FDA approved for NASAL specimens only), is one component of a comprehensive MRSA colonization surveillance program. It is not intended to diagnose MRSA infection nor to guide or monitor treatment for MRSA infections. Performed at [facility], [address] [place], [place] [address]   CULTURE, BLOOD (ROUTINE X 2) w Reflex to ID Panel     Status: None (Preliminary result)   Collection Time: 11/27/19  4:57 PM   Specimen: BLOOD  Result Value Ref Range Status   Specimen Description BLOOD RAC  Final   Special Requests   Final    BOTTLES DRAWN AEROBIC AND ANAEROBIC Blood Culture adequate volume   Culture   Final    NO GROWTH 2 DAYS Performed at [facility], [address], [place], [place] [address]    Report Status PENDING  Incomplete  CULTURE, BLOOD (ROUTINE X 2) w Reflex to ID Panel     Status: None (Preliminary result)   Collection Time: 11/27/19  5:01 PM   Specimen: BLOOD  Result Value Ref Range Status   Specimen Description BLOOD [facility]  Final   Special Requests   Final    BOTTLES DRAWN AEROBIC AND ANAEROBIC Blood Culture adequate volume   Culture   Final    NO GROWTH 2 DAYS Performed at [facility], [address] [place], [place] [address]    Report Status PENDING  Incomplete     Coagulation Studies: No results for input(s): LABPROT, INR in the last 72 hours.  Urinalysis: No results for input(s): COLORURINE, LABSPEC, PHURINE, GLUCOSEU, HGBUR, BILIRUBINUR, KETONESUR, PROTEINUR, UROBILINOGEN, NITRITE, LEUKOCYTESUR in the last 72 hours.  Invalid input(s): APPERANCEUR      Imaging: CT HEAD WO CONTRAST  Result Date: 11/27/2019 CLINICAL DATA:  Admitted 5 days ago for COVID-19 and altered level of consciousness, worsening mental status EXAM: CT HEAD WITHOUT CONTRAST TECHNIQUE: Contiguous axial images were obtained from the base of the skull through the vertex without intravenous contrast.  COMPARISON:  11/22/2019 FINDINGS: Brain: No acute infarct or hemorrhage. Lateral ventricles and midline structures are unremarkable. No acute extra-axial fluid collections. No mass effect. Vascular: No hyperdense vessel or unexpected calcification. Skull: Normal. Negative for fracture or focal lesion. Sinuses/Orbits: No acute finding. Other: None. IMPRESSION: 1. Stable head CT, no acute process. Electronically Signed   By: Randa Ngo M.D.   On: 11/27/2019 15:35   DG Chest Port 1 View  Result Date: 11/28/2019 CLINICAL DATA:  Enteric catheter placement EXAM: PORTABLE CHEST 1 VIEW COMPARISON:  11/28/2019 at 2:44 p.m. FINDINGS: Single frontal view of the chest demonstrates enteric catheter weighted tip projecting over the gastric fundus. Cardiac silhouette is unremarkable. Multifocal bilateral airspace disease greatest at the left lung base unchanged. No effusion or pneumothorax. IMPRESSION: 1. Enteric  catheter tip projects over gastric fundus. 2. Stable multifocal airspace disease compatible with COVID-19 pneumonia. Electronically Signed   By: Randa Ngo M.D.   On: 11/28/2019 16:54   DG Chest Port 1 View  Result Date: 11/28/2019 CLINICAL DATA:  Check nasogastric catheter placement EXAM: PORTABLE CHEST 1 VIEW COMPARISON:  11/26/2019 FINDINGS: Weighted feeding tube is noted within the stomach. Cardiac shadow is stable. Patchy opacities are again identified bilaterally. IMPRESSION: Weighted feeding tube within the stomach. Otherwise stable appearing chest. Electronically Signed   By: Inez Catalina M.D.   On: 11/28/2019 15:04   DG Abd Portable 1V  Result Date: 11/29/2019 CLINICAL DATA:  Nasogastric tube placement EXAM: PORTABLE ABDOMEN - 1 VIEW COMPARISON:  None similar FINDINGS: The enteric tube tip and side-port overlaps the stomach. Gallstone seen over the right upper quadrant. Partially covered abdomen with normal bowel gas pattern. Streaky density at the left base that is similar to yesterday.  Cardiomegaly. IMPRESSION: 1. Expected positioning of the nasogastric tube. 2. Cholelithiasis. Electronically Signed   By: Monte Fantasia M.D.   On: 11/29/2019 10:57      Assessment & Plan: Pt is a 59 y.o.   female , group home resident, with medical problems of CHF, CAD, DM-2, HLD, HTN, schizophrenia, thyroid disease, was admitted on 11/22/2019 with Confusion [R41.0] Hypoxia [R09.02] Acute respiratory failure with hypoxia ([place]) [J96.01] AKI (acute kidney injury) ([place]) [N17.9] Pneumonia due to COVID-19 virus [U07.1, J12.82] COVID-19 [U07.1]   # AKI  Baseline Creatinine 0.99 from 09/20/2019 Iv contrast exposure on  09/26/19 - no PE; b/l pneumonia, aortic atherosclerosis UTI - U/a- with hematuria RBCs 21-50- urine culture E Coli AKI is likely multifactorial including IV contrast exposure from July 8 since then creatinine has been increasing.  She also has a UTI from E. coli recently. -Continue IV hydration -Avoid hypotension Electrolytes and volume status are acceptable.  No acute indication for dialysis at present  #Hypernatremia Continue IV D5 replacement and monitoring BMP frequently Goal of correction 8 mEq in 24 hours  # hypercalcemia SPEP neg 11/26/19 PTH elevated at 111 Suspect primary hyperparathyroidism vs side effect of lithium - as chronic use can cause rise in PTH Continue IV hydration.  Expected to correct with hydration  # Covid-19 infection- Dx 11/10/2019 and 11/22/2019. No immunizations on file -Management as per ICU team  # DM-2, uncontrolled Lab Results  Component Value Date   HGBA1C 7.6 (H) 11/24/2019         LOS: 7 Raetta Agostinelli 9/10/202111:36 AM    Note: This note was prepared with Dragon dictation. Any transcription errors are unintentional

## 2019-11-29 NOTE — Progress Notes (Signed)
Palliative:   Ms. Elizabeth, Gutierrez, is future the glass door of the stepdown unit.  She is worsened with increasing sodium and has been transferred to stepdown.  Elizabeth Gutierrez appears acutely/chronically ill and quite frail.  She is clearly unable to make her basic needs known at this time.  There is no family at bedside at this time due to visitor restrictions due to Covid.   Call to mother Elizabeth Gutierrez.  She tells me that her other daughter, Elizabeth Gutierrez, who is also listed as legal guardian for Elizabeth Gutierrez is requesting a call.  Gives Elizabeth Gutierrez's number [phone].    Call to sister/legal guardian, Elizabeth Gutierrez.  Elizabeth Gutierrez and I talked in detail about Elizabeth Gutierrez acute health concerns.  We talked about the treatment plan and selected labs.  We also talked about Elizabeth Gutierrez chronic disease burden.  Elizabeth Gutierrez states that she was unaware of all of Elizabeth Gutierrez's health issues.  She tells me that she knew that Elizabeth Gutierrez had diabetes, but not high blood pressure/cholesterol or congestive heart failure.  Elizabeth Gutierrez does agree that Elizabeth Gutierrez seems to have aged a lot over the last few years.   We talked about CODE STATUS.  Elizabeth Gutierrez states that Elizabeth Gutierrez had to make life support choices for her son.  Elizabeth Gutierrez shares that she will talk to her mother about [place] for Elizabeth Gutierrez.  Elizabeth Gutierrez shares that she does not agree with ventilator support for Elizabeth Gutierrez if she is not going to have a quality of life.    Elizabeth Gutierrez states that they will try to set up face time with Elizabeth Gutierrez so that she can see Elizabeth Gutierrez in the intensive care.  We talked about time for outcomes.   Conference with attending and bedside nursing staff related to patient condition, needs, goals of care.  PMT to continue to follow.  Plan: At this point continue full scope/full code.  Time for outcomes.   41 minutes   Elizabeth Axe, NP Palliative medicine team Team phone [phone] Greater than 50% of this time was spent counseling and coordinating care related to the above assessment and  plan.

## 2019-11-29 NOTE — Progress Notes (Signed)
PROGRESS NOTE    Elizabeth Gutierrez  HYI:[id number] DOB: 27-Aug-1960 DOA: 11/22/2019 PCP: Denton Lank, MD   Brief Narrative: Taken from H&P. The patient is a 59 yr old cognitively impaired woman who lives in a group home. She was diagnosed with COVID 19 on 11/10/2019.  past medical history significant for CHF (EF 30-35% from echo 09/27/2019), CAD (Hx MI), DM II, Hypertension, Hyperlipidemia, Paranoid Schizophrenia, Thyroid disease. Admitted for COVID-19 pneumonia, UTI with altered mental status.  Requiring up to 9 L with HFNC, uses 2 L at home. Completed the treatment with remdesivir and UTI. Developed hypernatremia and hypercalcemia.  Subjective: Patient remains very obtunded, hardly opening eyes on sternal rub.  Assessment & Plan:   Principal Problem:   COVID-19 Active Problems:   Chronic systolic heart failure (HCC)   CAD in native artery   Controlled type 2 diabetes mellitus without complication (HCC)   Benign essential HTN   Altered mental state   Paranoid schizophrenia ([place])   Type 2 diabetes mellitus with hyperlipidemia (HCC)   Hypothyroidism   Acute respiratory failure with hypoxia (HCC)   Acute kidney injury superimposed on CKD (HCC)   Weakness   Hypercalcemia   Tachycardia   Goals of care, counseling/discussion   Palliative care by specialist   DNR (do not resuscitate) discussion   Malnutrition of moderate degree  Acute hypoxic respiratory failure secondary to COVID-19 pneumonia.  Patient continued to require 10-12 L of oxygen with baseline of 2 L at home.  Completed the treatment for remdesivir.  Remained quite altered.  Elevated inflammatory markers which are now improving. Elevated procalcitonin at 1.34.  MRSA nasal swab was negative. -Start her on cefepime and Zithromax. -Continue Solu-Medrol. -Discontinue baricitinib due to worsening leukocytosis and concern of bacterial infection. -Continue supportive care. -Continue supplements. -Palliative was consulted due to  worsening encephalopathy-mother needs some time before making some decisions regarding CODE STATUS and further management.  Patient will remain full code till that time.  Acute metabolic encephalopathy.  Patient remained quite drowsy, unable to follow any commands. Found to have hypercalcimia and worsening hypernatremia which can contribute to encephalopathy.  Patient also has underlying psych illness and on multiple psych meds.  She received recent treatment for E. coli UTI with cefazolin. Procalcitonin elevated. -Blood cultures remain negative with normal lactic acid. -Discontinue trazodone at this time-can be restarted if needed.  Hyperparathyroidism/ hypercalcemia.  Worsening today, hypercalcemia labs with mildly decreased vitamin D and elevated parathyroid.  Protein electrophoresis within normal limits. Patient also has CKD stage IIIb.-Complete the course of Miacalcin. PTH related peptide pending. -IV hydration with D5, we will, hypoglycemia with frequent CBG and insulin.  She can be started on insulin infusion if needed. -Consult nephrology.-Talk with Dr. Candiss Norse who will see the patient.  Hypernatremia.  Patient appears dry.worsening Na.  Multiple failed attempts to put an NG tube to start her on free water as she is n.p.o. secondary to severe encephalopathy. -Switch her fluid with D5, she might need insulin infusion with that. -Transfer her to stepdown. -IR stat request for NG tube placement. -Continue to monitor. -Discontinue lithium as it might be contributory to her hypernatremia. -Able to place NG tube-she will be started on free water and we will decrease the rate of D5.  HFrEF.  Patient has EF of 30 to 35% according to echo done in July 2021. -We will observe volume status as she needs IV fluid for worsening hypernatremia and hypercalcemia.  Diabetes mellitus.  Elevated CBG, improved as compare  to yesterday. -Put an order for Endo tool with insulin infusion as we are starting  her on D5 for worsening hyponatremia and hypercalcemia.  AKI with CKD stage IIIb.  Some improvement in creatinine and BUN today, remained above baseline. patient appears dry. -IV hydration. -Continue to monitor renal function. -Avoid nephrotoxins.  E. coli UTI.  Completed the course of antibiotics.  Acute urinary retention.  Patient developed acute urinary retention with abdominal pain yesterday requiring Foley catheter. -Continue to monitor-we will remove Foley catheter once some improvement in mental status.  Nursing concern of aspiration.  Most likely secondary to encephalopathy.  CT head was negative for any acute changes. -Keep her n.p.o. till cleared from swallow team-Unable to obtain formal swallow evaluation due to AMS, should try again once more responsive. -Insert Ng tube and start tube feed. -Patient will be high risk for refeeding syndrome-monitor electrolyte along with magnesium and phosphorus daily.  Schizophrenia.  Patient lives in a group home.  Lithium within therapeutic range. -Continue antipsychotics. -Decrease the dose of trazodone due to increased lethargy.  Neuropathy.  Her home dose of gabapentin is being held due to increased lethargy and altered mental status.  Hypothyroidism.  Due to TSH in hyperthyroid range her home dose of Synthroid was decreased during current hospitalization. -Continue Synthroid-patient will need a repeat TSH in 4 to 6 weeks.  Physical deconditioning/generalized weakness.  PT was ordered but she is unable to participate due to her mental status.  Objective: Vitals:   11/28/19 2312 11/28/19 2355 11/29/19 0421 11/29/19 0729  BP: 101/73  111/83 118/70  Pulse: (!) 101  (!) 110 (!) 48  Resp: 19  17 18   Temp: 97.6 F (36.4 C)  97.9 F (36.6 C) 98.6 F (37 C)  TempSrc: Oral  Oral Oral  SpO2: 90% 93% 92% 97%  Weight:      Height:        Intake/Output Summary (Last 24 hours) at 11/29/2019 0809 Last data filed at 11/29/2019 0518 Gross  per 24 hour  Intake 1491.76 ml  Output 1000 ml  Net 491.76 ml   Filed Weights   11/22/19 1933  Weight: 74.8 kg    Examination:  General.  Chronically ill-appearing, lethargic lady, in no acute distress. Pulmonary.  Lungs clear bilaterally, normal respiratory effort. CV.  Regular rate and rhythm, no JVD, rub or murmur. Abdomen.  Soft, nontender, nondistended, BS positive. CNS.  Lethargic with opening eyes on sternal rub, not following any other commands. Extremities.  No edema, no cyanosis, pulses intact and symmetrical. Psychiatry.  Judgment and insight appears impaired.  DVT prophylaxis: Lovenox Code Status: Full Family Communication: Mother was updated on phone. Disposition Plan:  Status is: Inpatient  Remains inpatient appropriate because:Inpatient level of care appropriate due to severity of illness   Dispo: The patient is from: Group home              Anticipated d/c is to: To be determined              Anticipated d/c date is: 2-3 days.              Patient currently is not medically stable to d/c.  Patient with worsening mental status.  High risk for deterioration and death.  Mother would like to keep her full code at this time and okay to proceed with NG tube feeding.  Palliative care is involved.   Consultants:   Palliative  Nephrology  Procedures:   Antimicrobials:   Cefepime Azithromax  Data Reviewed: I have personally reviewed following labs and imaging studies  CBC: Recent Labs  Lab 11/23/19 0400 11/23/19 0400 11/24/19 0521 11/25/19 0624 11/26/19 0609 11/27/19 0532 11/29/19 0515  WBC 6.0   < > 8.3 5.8 6.5 21.0* 26.2*  NEUTROABS 5.1  --  7.1 4.3 5.0 17.8*  --   HGB 11.0*   < > 11.3* 12.7 15.1* 16.1* 14.9  HCT 36.2   < > 37.9 41.8 50.0* 53.0* 51.3*  MCV 87.0   < > 88.3 86.9 88.2 87.7 90.3  PLT 235   < > 277 317 373 539* 477*   < > = values in this interval not displayed.   Basic Metabolic Panel: Recent Labs  Lab 11/22/19 1945  11/22/19 2216 11/25/19 2423 11/26/19 0609 11/27/19 0532 11/28/19 0644 11/29/19 0515  NA 140   < > 149* 149* 156* 159* 162*  K 3.8   < > 5.0 4.9 4.6 4.4 4.3  CL 109   < > 117* 112* 119* 123* 128*  CO2 19*   < > 25 24 21* 19* 23  GLUCOSE 137*   < > 254* 377* 305* 188* 224*  BUN 33*   < > 45* 58* 82* 96* 85*  CREATININE 1.95*   < > 1.76* 2.00* 2.66* 2.94* 2.50*  CALCIUM 9.7   < > 10.8* 11.7* 12.1* 11.7* 11.7*  MG 2.3  --   --   --   --   --  2.8*  PHOS  --   --   --   --   --   --  4.5   < > = values in this interval not displayed.   GFR: Estimated Creatinine Clearance: 23.5 mL/min (A) (by C-G formula based on SCr of 2.5 mg/dL (H)). Liver Function Tests: Recent Labs  Lab 11/25/19 0624 11/26/19 0609 11/27/19 0532 11/28/19 0644 11/29/19 0515  AST 17 14* 13* 12* 10*  ALT 13 13 10 7 6   ALKPHOS 77 89 93 81 70  BILITOT 0.6 0.7 0.8 1.3* 1.0  PROT 7.1 8.6* 8.3* 7.2 6.5  ALBUMIN 3.0* 3.7 3.4* 3.1* 2.7*   No results for input(s): LIPASE, AMYLASE in the last 168 hours. No results for input(s): AMMONIA in the last 168 hours. Coagulation Profile: No results for input(s): INR, PROTIME in the last 168 hours. Cardiac Enzymes: No results for input(s): CKTOTAL, CKMB, CKMBINDEX, TROPONINI in the last 168 hours. BNP (last 3 results) No results for input(s): PROBNP in the last 8760 hours. HbA1C: No results for input(s): HGBA1C in the last 72 hours. CBG: Recent Labs  Lab 11/28/19 1157 11/28/19 1617 11/28/19 1945 11/28/19 2312 11/29/19 0423  GLUCAP 236* 203* 197* 104* 188*   Lipid Profile: No results for input(s): CHOL, HDL, LDLCALC, TRIG, CHOLHDL, LDLDIRECT in the last 72 hours. Thyroid Function Tests: Recent Labs    11/26/19 1007  TSH 0.119*   Anemia Panel: Recent Labs    11/27/19 0532  FERRITIN 61   Sepsis Labs: Recent Labs  Lab 11/22/19 1945 11/22/19 2216 11/26/19 0609 11/27/19 0532 11/27/19 1657 11/27/19 1935 11/28/19 0644 11/29/19 0515  PROCALCITON  --     < > 1.08 1.34  --   --  1.09 0.86  LATICACIDVEN 0.9  --   --   --  1.6 1.3  --   --    < > = values in this interval not displayed.    Recent Results (from the past 240 hour(s))  SARS Coronavirus 2 by RT PCR (  hospital order, performed in [facility] hospital lab) Nasopharyngeal Nasopharyngeal Swab     Status: Abnormal   Collection Time: 11/22/19  7:45 PM   Specimen: Nasopharyngeal Swab  Result Value Ref Range Status   SARS Coronavirus 2 POSITIVE (A) NEGATIVE Final    Comment: RESULT CALLED TO, READ BACK BY AND VERIFIED WITH: CHRIS BUCKNER 11/22/19 AT 2048 HS (NOTE) SARS-CoV-2 target nucleic acids are DETECTED  SARS-CoV-2 RNA is generally detectable in upper respiratory specimens  during the acute phase of infection.  Positive results are indicative  of the presence of the identified virus, but do not rule out bacterial infection or co-infection with other pathogens not detected by the test.  Clinical correlation with patient history and  other diagnostic information is necessary to determine patient infection status.  The expected result is negative.  Fact Sheet for Patients:   StrictlyIdeas.no   Fact Sheet for Healthcare Providers:   BankingDealers.co.za    This test is not yet approved or cleared by the [place] FDA and  has been authorized for detection and/or diagnosis of SARS-CoV-2 by FDA under an Emergency Use Authorization (EUA).  This EUA will remain in effect (meaning this test  can be used) for the duration of  the COVID-19 declaration under Section 564(b)(1) of the Act, 21 U.S.C. section 360-bbb-3(b)(1), unless the authorization is terminated or revoked sooner.  Performed at [facility], [address] [place], [place] [address]   Urine Culture     Status: Abnormal   Collection Time: 11/22/19 11:25 PM   Specimen: Urine, Random  Result Value Ref Range Status   Specimen Description   Final    URINE,  RANDOM Performed at [facility], [address] [place], [place] [address]    Special Requests   Final    NONE Performed at [facility], [address] [place], [place] [address]    Culture >=100,000 COLONIES/mL ESCHERICHIA COLI (A)  Final   Report Status 11/26/2019 FINAL  Final   Organism ID, Bacteria ESCHERICHIA COLI (A)  Final      Susceptibility   Escherichia coli - MIC*    AMPICILLIN <=2 SENSITIVE Sensitive     CEFAZOLIN <=4 SENSITIVE Sensitive     CEFTRIAXONE <=0.25 SENSITIVE Sensitive     CIPROFLOXACIN <=0.25 SENSITIVE Sensitive     GENTAMICIN <=1 SENSITIVE Sensitive     IMIPENEM <=0.25 SENSITIVE Sensitive     NITROFURANTOIN 64 INTERMEDIATE Intermediate     TRIMETH/SULFA <=20 SENSITIVE Sensitive     AMPICILLIN/SULBACTAM <=2 SENSITIVE Sensitive     PIP/TAZO <=4 SENSITIVE Sensitive     * >=100,000 COLONIES/mL ESCHERICHIA COLI  MRSA PCR Screening     Status: None   Collection Time: 11/23/19 11:26 PM   Specimen: Nasal Mucosa; Nasopharyngeal  Result Value Ref Range Status   MRSA by PCR NEGATIVE NEGATIVE Final    Comment:        The GeneXpert MRSA Assay (FDA approved for NASAL specimens only), is one component of a comprehensive MRSA colonization surveillance program. It is not intended to diagnose MRSA infection nor to guide or monitor treatment for MRSA infections. Performed at [facility], [address] [place], [place] [address]   CULTURE, BLOOD (ROUTINE X 2) w Reflex to ID Panel     Status: None (Preliminary result)   Collection Time: 11/27/19  4:57 PM   Specimen: BLOOD  Result Value Ref Range Status   Specimen Description BLOOD RAC  Final   Special  Requests   Final    BOTTLES DRAWN AEROBIC AND ANAEROBIC Blood Culture adequate volume   Culture   Final    NO GROWTH 2 DAYS Performed at [facility], [address] [place], [place] [address]    Report Status PENDING  Incomplete  CULTURE, BLOOD (ROUTINE X 2) w  Reflex to ID Panel     Status: None (Preliminary result)   Collection Time: 11/27/19  5:01 PM   Specimen: BLOOD  Result Value Ref Range Status   Specimen Description BLOOD [facility]  Final   Special Requests   Final    BOTTLES DRAWN AEROBIC AND ANAEROBIC Blood Culture adequate volume   Culture   Final    NO GROWTH 2 DAYS Performed at [facility], [address] [place], [place] [address]    Report Status PENDING  Incomplete     Radiology Studies: CT HEAD WO CONTRAST  Result Date: 11/27/2019 CLINICAL DATA:  Admitted 5 days ago for COVID-19 and altered level of consciousness, worsening mental status EXAM: CT HEAD WITHOUT CONTRAST TECHNIQUE: Contiguous axial images were obtained from the base of the skull through the vertex without intravenous contrast. COMPARISON:  11/22/2019 FINDINGS: Brain: No acute infarct or hemorrhage. Lateral ventricles and midline structures are unremarkable. No acute extra-axial fluid collections. No mass effect. Vascular: No hyperdense vessel or unexpected calcification. Skull: Normal. Negative for fracture or focal lesion. Sinuses/Orbits: No acute finding. Other: None. IMPRESSION: 1. Stable head CT, no acute process. Electronically Signed   By: Randa Ngo M.D.   On: 11/27/2019 15:35   DG Chest Port 1 View  Result Date: 11/28/2019 CLINICAL DATA:  Enteric catheter placement EXAM: PORTABLE CHEST 1 VIEW COMPARISON:  11/28/2019 at 2:44 p.m. FINDINGS: Single frontal view of the chest demonstrates enteric catheter weighted tip projecting over the gastric fundus. Cardiac silhouette is unremarkable. Multifocal bilateral airspace disease greatest at the left lung base unchanged. No effusion or pneumothorax. IMPRESSION: 1. Enteric catheter tip projects over gastric fundus. 2. Stable multifocal airspace disease compatible with COVID-19 pneumonia. Electronically Signed   By: Randa Ngo M.D.   On: 11/28/2019 16:54   DG Chest Port 1 View  Result Date: 11/28/2019 CLINICAL  DATA:  Check nasogastric catheter placement EXAM: PORTABLE CHEST 1 VIEW COMPARISON:  11/26/2019 FINDINGS: Weighted feeding tube is noted within the stomach. Cardiac shadow is stable. Patchy opacities are again identified bilaterally. IMPRESSION: Weighted feeding tube within the stomach. Otherwise stable appearing chest. Electronically Signed   By: Inez Catalina M.D.   On: 11/28/2019 15:04    Scheduled Meds: . albuterol  2 puff Inhalation Q6H  . ARIPiprazole  30 mg Oral QHS  . vitamin C  500 mg Oral Daily  . aspirin  81 mg Oral Daily  . benztropine  0.5 mg Oral BID  . Chlorhexidine Gluconate Cloth  6 each Topical Daily  . cholecalciferol  1,000 Units Oral Daily  . diltiazem  30 mg Oral Q6H  . enoxaparin (LOVENOX) injection  30 mg Subcutaneous Q24H  . feeding supplement (PROSource TF)  45 mL Per Tube BID  . fluticasone  2 spray Each Nare Daily  . free water  250 mL Per Tube Q2H  . insulin aspart  0-20 Units Subcutaneous Q4H  . insulin detemir  10 Units Subcutaneous QHS  . levothyroxine  25 mcg Oral QAC breakfast  . lithium citrate  300 mg Per Tube Q12H  . liver oil-zinc oxide   Topical TID  . mouth rinse  15  mL Mouth Rinse BID  . methylPREDNISolone (SOLU-MEDROL) injection  40 mg Intravenous Q12H  . pantoprazole (PROTONIX) IV  40 mg Intravenous QHS  . polyethylene glycol  17 g Oral Daily  . senna  1 tablet Oral QHS  . sertraline  100 mg Oral q AM  . zinc sulfate  220 mg Oral Daily   Continuous Infusions: . azithromycin 250 mL/hr at 11/28/19 1819  . ceFEPime (MAXIPIME) IV 2 g (11/28/19 2140)  . dextrose    . feeding supplement (JEVITY 1.5 CAL/FIBER)    . insulin       LOS: 7 days   Time spent: 40 minutes.  Lorella Nimrod, MD [facility]  If 7PM-7AM, please contact night-coverage [email or web address]  11/29/2019, 8:09 AM   This record has been created using Systems analyst. Errors have been sought and corrected,but may not always be located. Such creation  errors do not reflect on the standard of care.

## 2019-11-29 NOTE — Progress Notes (Signed)
Paged Dr Reesa Chew regarding insulin drip twice. Will be paging again.

## 2019-11-30 LAB — GLUCOSE, CAPILLARY
Glucose-Capillary: 207 mg/dL — ABNORMAL HIGH (ref 70–99)
Glucose-Capillary: 253 mg/dL — ABNORMAL HIGH (ref 70–99)
Glucose-Capillary: 303 mg/dL — ABNORMAL HIGH (ref 70–99)
Glucose-Capillary: 304 mg/dL — ABNORMAL HIGH (ref 70–99)
Glucose-Capillary: 310 mg/dL — ABNORMAL HIGH (ref 70–99)
Glucose-Capillary: 351 mg/dL — ABNORMAL HIGH (ref 70–99)
Glucose-Capillary: 360 mg/dL — ABNORMAL HIGH (ref 70–99)

## 2019-11-30 LAB — CBC
HCT: 44.7 % (ref 36.0–46.0)
Hemoglobin: 13 g/dL (ref 12.0–15.0)
MCH: 25.9 pg — ABNORMAL LOW (ref 26.0–34.0)
MCHC: 29.1 g/dL — ABNORMAL LOW (ref 30.0–36.0)
MCV: 89 fL (ref 80.0–100.0)
Platelets: 381 10*3/uL (ref 150–400)
RBC: 5.02 MIL/uL (ref 3.87–5.11)
RDW: 17.5 % — ABNORMAL HIGH (ref 11.5–15.5)
WBC: 22.9 10*3/uL — ABNORMAL HIGH (ref 4.0–10.5)
nRBC: 0.1 % (ref 0.0–0.2)

## 2019-11-30 LAB — COMPREHENSIVE METABOLIC PANEL
ALT: 7 U/L (ref 0–44)
AST: 11 U/L — ABNORMAL LOW (ref 15–41)
Albumin: 2.5 g/dL — ABNORMAL LOW (ref 3.5–5.0)
Alkaline Phosphatase: 62 U/L (ref 38–126)
Anion gap: 8 (ref 5–15)
BUN: 70 mg/dL — ABNORMAL HIGH (ref 6–20)
CO2: 20 mmol/L — ABNORMAL LOW (ref 22–32)
Calcium: 11 mg/dL — ABNORMAL HIGH (ref 8.9–10.3)
Chloride: 121 mmol/L — ABNORMAL HIGH (ref 98–111)
Creatinine, Ser: 1.67 mg/dL — ABNORMAL HIGH (ref 0.44–1.00)
GFR calc Af Amer: 38 mL/min — ABNORMAL LOW (ref >60)
GFR calc non Af Amer: 33 mL/min — ABNORMAL LOW (ref >60)
Glucose, Bld: 345 mg/dL — ABNORMAL HIGH (ref 70–99)
Potassium: 3.9 mmol/L (ref 3.5–5.1)
Sodium: 149 mmol/L — ABNORMAL HIGH (ref 135–145)
Total Bilirubin: 0.7 mg/dL (ref 0.3–1.2)
Total Protein: 5.8 g/dL — ABNORMAL LOW (ref 6.5–8.1)

## 2019-11-30 LAB — BASIC METABOLIC PANEL
Anion gap: 4 — ABNORMAL LOW (ref 5–15)
BUN: 60 mg/dL — ABNORMAL HIGH (ref 6–20)
CO2: 24 mmol/L (ref 22–32)
Calcium: 11 mg/dL — ABNORMAL HIGH (ref 8.9–10.3)
Chloride: 118 mmol/L — ABNORMAL HIGH (ref 98–111)
Creatinine, Ser: 1.43 mg/dL — ABNORMAL HIGH (ref 0.44–1.00)
GFR calc Af Amer: 46 mL/min — ABNORMAL LOW (ref >60)
GFR calc non Af Amer: 40 mL/min — ABNORMAL LOW (ref >60)
Glucose, Bld: 399 mg/dL — ABNORMAL HIGH (ref 70–99)
Potassium: 3.9 mmol/L (ref 3.5–5.1)
Sodium: 146 mmol/L — ABNORMAL HIGH (ref 135–145)

## 2019-11-30 LAB — PHOSPHORUS: Phosphorus: 3 mg/dL (ref 2.5–4.6)

## 2019-11-30 LAB — MAGNESIUM: Magnesium: 2.6 mg/dL — ABNORMAL HIGH (ref 1.7–2.4)

## 2019-11-30 MED ORDER — SODIUM CHLORIDE 0.9 % IV SOLN
2.0000 g | Freq: Two times a day (BID) | INTRAVENOUS | Status: AC
  Administered 2019-11-30 – 2019-12-03 (×7): 2 g via INTRAVENOUS
  Filled 2019-11-30 (×9): qty 2

## 2019-11-30 MED ORDER — INSULIN DETEMIR 100 UNIT/ML ~~LOC~~ SOLN
8.0000 [IU] | Freq: Every day | SUBCUTANEOUS | Status: DC
  Administered 2019-11-30: 8 [IU] via SUBCUTANEOUS
  Filled 2019-11-30 (×2): qty 0.08

## 2019-11-30 MED ORDER — ENOXAPARIN SODIUM 40 MG/0.4ML ~~LOC~~ SOLN
40.0000 mg | SUBCUTANEOUS | Status: DC
  Administered 2019-11-30 – 2019-12-07 (×8): 40 mg via SUBCUTANEOUS
  Filled 2019-11-30 (×8): qty 0.4

## 2019-11-30 NOTE — Progress Notes (Signed)
[employer] attempted to provide family with video call via three separate lines and family unable to connect.

## 2019-11-30 NOTE — Consult Note (Signed)
Pharmacy Antibiotic Note  Elizabeth Gutierrez is a 59 y.o. female admitted on 11/22/2019 with altered mental status and hypoxia. Pt is from a group home. Pt is COVID positive since 8/22. PMH includes CHF, CAD, diabetes, HTN, HLD, schizophrenia, and thyroid disease. Pharmacy has been consulted for cefepime dosing for healthcare-acquired pneumonia.  Plan: cefepime 2 g q24H change to 2g q12h (per CrCL > 30)  Height: 5\' 3"  (160 cm) Weight: 81.2 kg (179 lb 0.2 oz) IBW/kg (Calculated) : 52.4  Temp (24hrs), Avg:97.5 F (36.4 C), Min:97.1 F (36.2 C), Max:98 F (36.7 C)  Recent Labs  Lab 11/25/19 0624 11/25/19 0624 11/26/19 0609 11/26/19 0609 11/27/19 0532 11/27/19 0532 11/27/19 1657 11/27/19 1935 11/28/19 0644 11/29/19 0515 11/29/19 1716 11/29/19 2300 11/30/19 0520  WBC 5.8  --  6.5  --  21.0*  --   --   --   --  26.2*  --   --  22.9*  CREATININE 1.76*   < > 2.00*   < > 2.66*   < >  --   --  2.94* 2.50* 2.10* 1.84* 1.67*  LATICACIDVEN  --   --   --   --   --   --  1.6 1.3  --   --   --   --   --    < > = values in this interval not displayed.    Estimated Creatinine Clearance: 36.6 mL/min (A) (by C-G formula based on SCr of 1.67 mg/dL (H)).    No Known Allergies  Antimicrobials this admission: 9/8 azithromycin >> 9/7 cefazolin >> 9/8 9/4 ceftriaxone >> 9/6 9/8 cefepime >>  Microbiology results: 9/8 BCx: NGTD 9/3 UCx: e. Coli  9/3 COVID PCR: positive 9/4 MRSA PCR: negative  Thank you for allowing pharmacy to be a part of this patient's care.  Lu Duffel, PharmD, BCPS Clinical Pharmacist 11/30/2019 2:47 PM

## 2019-11-30 NOTE — Progress Notes (Signed)
[facility]  ROUNDING NOTE   Subjective:  Patient remains critically ill. Acute kidney injury persist. Currently on nasal cannula. Serum sodium still high at 149. BUN currently 70 with a creatinine of 1.67.  Objective:  Vital signs in last 24 hours:  Temp:  [97.1 F (36.2 C)-98.3 F (36.8 C)] 97.1 F (36.2 C) (09/11 0800) Pulse Rate:  [78-101] 86 (09/11 0800) Resp:  [18-28] 20 (09/11 0800) BP: (93-129)/(56-105) 108/71 (09/11 0800) SpO2:  [90 %-99 %] 97 % (09/11 0800) Weight:  [81.2 kg] 81.2 kg (09/11 0410)  Weight change:  Filed Weights   11/22/19 1933 11/29/19 1030 11/30/19 0410  Weight: 74.8 kg 79.9 kg 81.2 kg    Intake/Output: I/O last 3 completed shifts: In: 1535.3 [I.V.:785.3; NG/GT:550; IV Piggyback:200] Out: 4742 [Urine:3450]   Intake/Output this shift:  Total I/O In: -  Out: 350 [Urine:350]  Physical Exam: General:  Critically ill-appearing  Head:  Normocephalic, atraumatic.  Eyes:  Closed  Neck:  Trachea midline  Lungs:   Respiratory rate 19, nonlabored  Heart:  Regular, heart rate 76  Abdomen:   Nondistended  Extremities:  Trace peripheral edema.  Neurologic:  Lethargic  Skin:  Decreased turgor  GU:  Foley in place    Basic Metabolic Panel: Recent Labs  Lab 11/28/19 0644 11/28/19 0644 11/29/19 0515 11/29/19 0515 11/29/19 1716 11/29/19 2300 11/30/19 0520  NA 159*  --  162*  --  155* 149* 149*  K 4.4  --  4.3  --  3.9 3.3* 3.9  CL 123*  --  128*  --  124* 125* 121*  CO2 19*  --  23  --  21* 20* 20*  GLUCOSE 188*  --  224*  --  301* 336* 345*  BUN 96*  --  85*  --  77* 67* 70*  CREATININE 2.94*  --  2.50*  --  2.10* 1.84* 1.67*  CALCIUM 11.7*   < > 11.7*   < > 11.4* 10.1 11.0*  MG  --   --  2.8*  --   --   --  2.6*  PHOS  --   --  4.5  --   --   --  3.0   < > = values in this interval not displayed.    Liver Function Tests: Recent Labs  Lab 11/26/19 0609 11/27/19 0532 11/28/19 0644 11/29/19 0515 11/30/19 0520  AST  14* 13* 12* 10* 11*  ALT 13 10 7 6 7   ALKPHOS 89 93 81 70 62  BILITOT 0.7 0.8 1.3* 1.0 0.7  PROT 8.6* 8.3* 7.2 6.5 5.8*  ALBUMIN 3.7 3.4* 3.1* 2.7* 2.5*   No results for input(s): LIPASE, AMYLASE in the last 168 hours. No results for input(s): AMMONIA in the last 168 hours.  CBC: Recent Labs  Lab 11/24/19 0521 11/24/19 0521 11/25/19 0624 11/26/19 0609 11/27/19 0532 11/29/19 0515 11/30/19 0520  WBC 8.3   < > 5.8 6.5 21.0* 26.2* 22.9*  NEUTROABS 7.1  --  4.3 5.0 17.8*  --   --   HGB 11.3*   < > 12.7 15.1* 16.1* 14.9 13.0  HCT 37.9   < > 41.8 50.0* 53.0* 51.3* 44.7  MCV 88.3   < > 86.9 88.2 87.7 90.3 89.0  PLT 277   < > 317 373 539* 477* 381   < > = values in this interval not displayed.    Cardiac Enzymes: No results for input(s): CKTOTAL, CKMB, CKMBINDEX, TROPONINI in the last 168  hours.  BNP: Invalid input(s): POCBNP  CBG: Recent Labs  Lab 11/29/19 2038 11/29/19 2221 11/30/19 0022 11/30/19 0317 11/30/19 0751  GLUCAP 328* 350* 304* 253* 310*    Microbiology: Results for orders placed or performed during the hospital encounter of 11/22/19  SARS Coronavirus 2 by RT PCR (hospital order, performed in [facility] hospital lab) Nasopharyngeal Nasopharyngeal Swab     Status: Abnormal   Collection Time: 11/22/19  7:45 PM   Specimen: Nasopharyngeal Swab  Result Value Ref Range Status   SARS Coronavirus 2 POSITIVE (A) NEGATIVE Final    Comment: RESULT CALLED TO, READ BACK BY AND VERIFIED WITH: CHRIS BUCKNER 11/22/19 AT 2048 HS (NOTE) SARS-CoV-2 target nucleic acids are DETECTED  SARS-CoV-2 RNA is generally detectable in upper respiratory specimens  during the acute phase of infection.  Positive results are indicative  of the presence of the identified virus, but do not rule out bacterial infection or co-infection with other pathogens not detected by the test.  Clinical correlation with patient history and  other diagnostic information is necessary to determine  patient infection status.  The expected result is negative.  Fact Sheet for Patients:   StrictlyIdeas.no   Fact Sheet for Healthcare Providers:   BankingDealers.co.za    This test is not yet approved or cleared by the [place] FDA and  has been authorized for detection and/or diagnosis of SARS-CoV-2 by FDA under an Emergency Use Authorization (EUA).  This EUA will remain in effect (meaning this test  can be used) for the duration of  the COVID-19 declaration under Section 564(b)(1) of the Act, 21 U.S.C. section 360-bbb-3(b)(1), unless the authorization is terminated or revoked sooner.  Performed at [facility], [address] [place], [place] [address]   Urine Culture     Status: Abnormal   Collection Time: 11/22/19 11:25 PM   Specimen: Urine, Random  Result Value Ref Range Status   Specimen Description   Final    URINE, RANDOM Performed at [facility], [address] [place], [place] [address]    Special Requests   Final    NONE Performed at [facility], [address] [place], [place] [address]    Culture >=100,000 COLONIES/mL ESCHERICHIA COLI (A)  Final   Report Status 11/26/2019 FINAL  Final   Organism ID, Bacteria ESCHERICHIA COLI (A)  Final      Susceptibility   Escherichia coli - MIC*    AMPICILLIN <=2 SENSITIVE Sensitive     CEFAZOLIN <=4 SENSITIVE Sensitive     CEFTRIAXONE <=0.25 SENSITIVE Sensitive     CIPROFLOXACIN <=0.25 SENSITIVE Sensitive     GENTAMICIN <=1 SENSITIVE Sensitive     IMIPENEM <=0.25 SENSITIVE Sensitive     NITROFURANTOIN 64 INTERMEDIATE Intermediate     TRIMETH/SULFA <=20 SENSITIVE Sensitive     AMPICILLIN/SULBACTAM <=2 SENSITIVE Sensitive     PIP/TAZO <=4 SENSITIVE Sensitive     * >=100,000 COLONIES/mL ESCHERICHIA COLI  MRSA PCR Screening     Status: None   Collection Time: 11/23/19 11:26 PM   Specimen: Nasal Mucosa; Nasopharyngeal  Result Value Ref  Range Status   MRSA by PCR NEGATIVE NEGATIVE Final    Comment:        The GeneXpert MRSA Assay (FDA approved for NASAL specimens only), is one component of a comprehensive MRSA colonization surveillance program. It is not intended to diagnose MRSA infection nor to guide or monitor treatment for MRSA infections. Performed at [facility], [address] [place], [place] [address]   CULTURE, BLOOD (ROUTINE X 2) w Reflex to ID Panel     Status: None (Preliminary result)   Collection Time: 11/27/19  4:57 PM   Specimen: BLOOD  Result Value Ref Range Status   Specimen Description BLOOD RAC  Final   Special Requests   Final    BOTTLES DRAWN AEROBIC AND ANAEROBIC Blood Culture adequate volume   Culture   Final    NO GROWTH 3 DAYS Performed at [facility], [address], [place], [place] [address]    Report Status PENDING  Incomplete  CULTURE, BLOOD (ROUTINE X 2) w Reflex to ID Panel     Status: None (Preliminary result)   Collection Time: 11/27/19  5:01 PM   Specimen: BLOOD  Result Value Ref Range Status   Specimen Description BLOOD [facility]  Final   Special Requests   Final    BOTTLES DRAWN AEROBIC AND ANAEROBIC Blood Culture adequate volume   Culture   Final    NO GROWTH 3 DAYS Performed at [facility], [address], [place], [place] [address]    Report Status PENDING  Incomplete  MRSA PCR Screening     Status: None   Collection Time: 11/29/19 10:28 AM   Specimen: Nasopharyngeal  Result Value Ref Range Status   MRSA by PCR NEGATIVE NEGATIVE Final    Comment:        The GeneXpert MRSA Assay (FDA approved for NASAL specimens only), is one component of a comprehensive MRSA colonization surveillance program. It is not intended to diagnose MRSA infection nor to guide or monitor treatment for MRSA infections. Performed at [facility], [address] [place],  [address]     Coagulation Studies: No results for input(s):  LABPROT, INR in the last 72 hours.  Urinalysis: No results for input(s): COLORURINE, LABSPEC, PHURINE, GLUCOSEU, HGBUR, BILIRUBINUR, KETONESUR, PROTEINUR, UROBILINOGEN, NITRITE, LEUKOCYTESUR in the last 72 hours.  Invalid input(s): APPERANCEUR    Imaging: DG Chest Port 1 View  Result Date: 11/28/2019 CLINICAL DATA:  Enteric catheter placement EXAM: PORTABLE CHEST 1 VIEW COMPARISON:  11/28/2019 at 2:44 p.m. FINDINGS: Single frontal view of the chest demonstrates enteric catheter weighted tip projecting over the gastric fundus. Cardiac silhouette is unremarkable. Multifocal bilateral airspace disease greatest at the left lung base unchanged. No effusion or pneumothorax. IMPRESSION: 1. Enteric catheter tip projects over gastric fundus. 2. Stable multifocal airspace disease compatible with COVID-19 pneumonia. Electronically Signed   By: Randa Ngo M.D.   On: 11/28/2019 16:54   DG Chest Port 1 View  Result Date: 11/28/2019 CLINICAL DATA:  Check nasogastric catheter placement EXAM: PORTABLE CHEST 1 VIEW COMPARISON:  11/26/2019 FINDINGS: Weighted feeding tube is noted within the stomach. Cardiac shadow is stable. Patchy opacities are again identified bilaterally. IMPRESSION: Weighted feeding tube within the stomach. Otherwise stable appearing chest. Electronically Signed   By: Inez Catalina M.D.   On: 11/28/2019 15:04   DG Abd Portable 1V  Result Date: 11/29/2019 CLINICAL DATA:  Nasogastric tube placement EXAM: PORTABLE ABDOMEN - 1 VIEW COMPARISON:  None similar FINDINGS: The enteric tube tip and side-port overlaps the stomach. Gallstone seen over the right upper quadrant. Partially covered abdomen with normal bowel gas pattern. Streaky density at the left base that is similar to yesterday. Cardiomegaly. IMPRESSION: 1. Expected positioning of the nasogastric tube. 2. Cholelithiasis. Electronically Signed   By: Monte Fantasia M.D.   On: 11/29/2019 10:57     Medications:   .  ceFEPime (MAXIPIME) IV  Stopped (11/29/19 2310)  . dextrose 75 mL/hr at 11/30/19 0600  . feeding supplement (JEVITY 1.5 CAL/FIBER) 1,000 mL (11/29/19 1657)   . albuterol  2 puff Inhalation Q6H  . ARIPiprazole  30 mg Per Tube QHS  . vitamin C  500 mg Per Tube Daily  . aspirin  81 mg Per Tube Daily  . benztropine  0.5 mg Per Tube BID  . Chlorhexidine Gluconate Cloth  6 each Topical Daily  . cholecalciferol  1,000 Units Per Tube Daily  . diltiazem  30 mg Per Tube Q6H  . enoxaparin (LOVENOX) injection  40 mg Subcutaneous Q24H  . feeding supplement (PROSource TF)  45 mL Per Tube BID  . fluticasone  2 spray Each Nare Daily  . free water  250 mL Per Tube Q2H  . insulin aspart  0-20 Units Subcutaneous Q4H  . levothyroxine  25 mcg Per Tube QAC breakfast  . liver oil-zinc oxide   Topical TID  . mouth rinse  15 mL Mouth Rinse BID  . methylPREDNISolone (SOLU-MEDROL) injection  40 mg Intravenous Q12H  . pantoprazole (PROTONIX) IV  40 mg Intravenous QHS  . polyethylene glycol  17 g Oral Daily  . senna  1 tablet Per Tube QHS  . sertraline  100 mg Per Tube q AM  . zinc sulfate  220 mg Oral Daily   acetaminophen, chlorpheniramine-HYDROcodone, dextromethorphan-guaiFENesin, docusate sodium, metoprolol tartrate  Assessment/ Plan:  59 y.o. female who is a group home resident with past medical history of congestive heart failure ejection fraction 30 to 35%, coronary disease, diabetes mellitus type 2, hyperlipidemia, hypertension, schizophrenia, history of thyroid disease admitted on 11/22/2019 with altered mental status, proxy, acute respiratory failure secondary to COVID-19 pneumonia, acute kidney injury.  1.  Acute kidney injury.  Baseline creatinine 0.9 on 09/20/2019.  Had contrast exposure 09/26/2019.  COVID-19 pneumonia also playing a role.  Continue hydration with D5 since sodium still high.  No urgent indication for dialysis.  Continue to monitor renal parameters and avoid nephrotoxins as possible.  2.  Hypernatremia.   Maintain the patient on D5W but increase rate to 100 cc/h.  3.  Acute respiratory failure secondary to COVID-19 infection.  Currently on nasal cannula.  Continue supportive care.   LOS: 8 Etoy Mcdonnell 9/11/202111:22 AM

## 2019-11-30 NOTE — Progress Notes (Signed)
PROGRESS NOTE    Elizabeth Gutierrez  GHW:[id number] DOB: July 15, 1960 DOA: 11/22/2019 PCP: Denton Lank, MD   Brief Narrative: Taken from H&P. The patient is a 59 yr old cognitively impaired woman who lives in a group home. She was diagnosed with COVID 19 on 11/10/2019.  past medical history significant for CHF (EF 30-35% from echo 09/27/2019), CAD (Hx MI), DM II, Hypertension, Hyperlipidemia, Paranoid Schizophrenia, Thyroid disease. Admitted for COVID-19 pneumonia, UTI with altered mental status.  Requiring up to 9 L with HFNC, uses 2 L at home. Completed the treatment with remdesivir and UTI. Developed hypernatremia and hypercalcemia.  Subjective: Patient was little more alert when seen today.  She was able to follow commands.  She asked me that who are you and told me that he is feeling little better.  Assessment & Plan:   Principal Problem:   COVID-19 Active Problems:   Chronic systolic heart failure (HCC)   CAD in native artery   Controlled type 2 diabetes mellitus without complication (HCC)   Benign essential HTN   Altered mental state   Paranoid schizophrenia ([place])   Type 2 diabetes mellitus with hyperlipidemia (HCC)   Hypothyroidism   Acute respiratory failure with hypoxia (HCC)   Acute kidney injury superimposed on CKD (HCC)   Weakness   Hypercalcemia   Tachycardia   Goals of care, counseling/discussion   Palliative care by specialist   DNR (do not resuscitate) discussion   Malnutrition of moderate degree  Acute hypoxic respiratory failure secondary to COVID-19 pneumonia.   Patient showed some improvement in oxygenation today, saturating 100% on 12 L when I saw her-able to wean to 6 L with saturation remained above 90%.  Baseline use of 2 L.  Completed the treatment for remdesivir.  Did receive baricitinib which was discontinued due to worsening leukocytosis.  Elevated inflammatory markers which are now improving. Elevated procalcitonin at 1.34.  MRSA nasal swab was  negative. -Continue cefepime and Zithromax. -Continue Solu-Medrol. -Continue supportive care. -Continue supplements. -Palliative was consulted due to worsening encephalopathy-mother needs some time before making some decisions regarding CODE STATUS and further management.  Patient will remain full code till that time.  Acute metabolic encephalopathy.  Some improvement in mentation today.  Able to follow command and do simple conversation. Found to have hypercalcimia and worsening hypernatremia which can contribute to encephalopathy.  Patient also has underlying psych illness and on multiple psych meds.  -Blood cultures remain negative with normal lactic acid. -Discontinue trazodone at this time-can be restarted if needed. -Lithium was also discontinued.  Hyperparathyroidism/ hypercalcemia.  Some improvement in calcium level, corrected calcium of 12.2 today. hypercalcemia labs with mildly decreased vitamin D and elevated parathyroid.  Protein electrophoresis within normal limits. Patient also has CKD stage IIIb.-Complete the course of Miacalcin. PTH related peptide pending. -IV hydration with D5-She can be started on insulin infusion if needed. -Consult nephrology.-Appreciate their help.  Hypernatremia.  Started improving, corrected sodium of 153 today. NG tube was placed yesterday and she was also started on free water along with D5.  Lithium was also discontinued as that can also contribute to hyponatremia. -Continue to monitor. -Decrease the rate of D5 and continue for 1 more day.  HFrEF.  Patient has EF of 30 to 35% according to echo done in July 2021. -We will observe volume status as she needs IV fluid for worsening hypernatremia and hypercalcemia.  Diabetes mellitus.  Elevated CBG. -Continue frequent CBG check and insulin according to sliding scale.  AKI with  CKD stage IIIb.  Creatinine continue to improve now. -Continue IV hydration. -Continue to monitor renal  function. -Avoid nephrotoxins.  E. coli UTI.  Completed the course of antibiotics.  Acute urinary retention.  Patient developed acute urinary retention with abdominal pain yesterday requiring Foley catheter. -Continue to monitor-we will remove Foley catheter once some improvement in mental status.  Nursing concern of aspiration.  Most likely secondary to encephalopathy.  CT head was negative for any acute changes.  NG tube was inserted yesterday and she was started on tube feed. -We will obtained another swallow evaluation as patient is little more responsive today. -Keep her n.p.o. till cleared from swallow team- -Patient will be high risk for refeeding syndrome-monitor electrolyte along with magnesium and phosphorus daily.  Schizophrenia.  Patient lives in a group home.  Lithium within therapeutic range on admission.  Lithium and trazodone was discontinued due to worsening hypernatremia and mental status. -Continue antipsychotics.  Neuropathy.  Her home dose of gabapentin is being held due to increased lethargy and altered mental status.  Hypothyroidism.  Due to TSH in hyperthyroid range her home dose of Synthroid was decreased during current hospitalization. -Continue Synthroid-patient will need a repeat TSH in 4 to 6 weeks.  Physical deconditioning/generalized weakness.  PT was ordered but she is unable to participate due to her mental status.  Objective: Vitals:   11/30/19 0400 11/30/19 0410 11/30/19 0500 11/30/19 0800  BP: (!) 107/57  (!) 129/105 108/71  Pulse: 92  89 86  Resp: (!) 21  (!) 25 20  Temp:    (!) 97.1 F (36.2 C)  TempSrc:      SpO2: 93%  95% 97%  Weight:  81.2 kg    Height:        Intake/Output Summary (Last 24 hours) at 11/30/2019 0900 Last data filed at 11/30/2019 0800 Gross per 24 hour  Intake 1535.26 ml  Output 2800 ml  Net -1264.74 ml   Filed Weights   11/22/19 1933 11/29/19 1030 11/30/19 0410  Weight: 74.8 kg 79.9 kg 81.2 kg     Examination:  General. Well-developed, lethargic lady, in no acute distress. Pulmonary.  Lungs clear bilaterally, normal respiratory effort. CV.  Regular rate and rhythm, no JVD, rub or murmur. Abdomen.  Soft, nontender, nondistended, BS positive. CNS.  Little more alert and following simple commands, no focal neurologic deficit. Extremities.  No edema, no cyanosis, pulses intact and symmetrical. Psychiatry.  Judgment and insight appears impaired.  DVT prophylaxis: Lovenox Code Status: Full Family Communication: Mother was updated on phone. Disposition Plan:  Status is: Inpatient  Remains inpatient appropriate because:Inpatient level of care appropriate due to severity of illness   Dispo: The patient is from: Group home              Anticipated d/c is to: To be determined              Anticipated d/c date is: 2-3 days.              Patient currently is not medically stable to d/c.  Patient started showing some response but remained high risk for deterioration and death.  Mother would like to keep her full code at this time and okay to proceed with NG tube feeding.  Palliative care is involved.   Consultants:   Palliative  Nephrology  Procedures:   Antimicrobials:   Cefepime Azithromax  Data Reviewed: I have personally reviewed following labs and imaging studies  CBC: Recent Labs  Lab 11/24/19  5366 11/24/19 4403 11/25/19 4742 11/26/19 0609 11/27/19 0532 11/29/19 0515 11/30/19 0520  WBC 8.3   < > 5.8 6.5 21.0* 26.2* 22.9*  NEUTROABS 7.1  --  4.3 5.0 17.8*  --   --   HGB 11.3*   < > 12.7 15.1* 16.1* 14.9 13.0  HCT 37.9   < > 41.8 50.0* 53.0* 51.3* 44.7  MCV 88.3   < > 86.9 88.2 87.7 90.3 89.0  PLT 277   < > 317 373 539* 477* 381   < > = values in this interval not displayed.   Basic Metabolic Panel: Recent Labs  Lab 11/28/19 0644 11/29/19 0515 11/29/19 1716 11/29/19 2300 11/30/19 0520  NA 159* 162* 155* 149* 149*  K 4.4 4.3 3.9 3.3* 3.9  CL 123*  128* 124* 125* 121*  CO2 19* 23 21* 20* 20*  GLUCOSE 188* 224* 301* 336* 345*  BUN 96* 85* 77* 67* 70*  CREATININE 2.94* 2.50* 2.10* 1.84* 1.67*  CALCIUM 11.7* 11.7* 11.4* 10.1 11.0*  MG  --  2.8*  --   --  2.6*  PHOS  --  4.5  --   --  3.0   GFR: Estimated Creatinine Clearance: 36.6 mL/min (A) (by C-G formula based on SCr of 1.67 mg/dL (H)). Liver Function Tests: Recent Labs  Lab 11/26/19 0609 11/27/19 0532 11/28/19 0644 11/29/19 0515 11/30/19 0520  AST 14* 13* 12* 10* 11*  ALT 13 10 7 6 7   ALKPHOS 89 93 81 70 62  BILITOT 0.7 0.8 1.3* 1.0 0.7  PROT 8.6* 8.3* 7.2 6.5 5.8*  ALBUMIN 3.7 3.4* 3.1* 2.7* 2.5*   No results for input(s): LIPASE, AMYLASE in the last 168 hours. No results for input(s): AMMONIA in the last 168 hours. Coagulation Profile: No results for input(s): INR, PROTIME in the last 168 hours. Cardiac Enzymes: No results for input(s): CKTOTAL, CKMB, CKMBINDEX, TROPONINI in the last 168 hours. BNP (last 3 results) No results for input(s): PROBNP in the last 8760 hours. HbA1C: No results for input(s): HGBA1C in the last 72 hours. CBG: Recent Labs  Lab 11/29/19 2038 11/29/19 2221 11/30/19 0022 11/30/19 0317 11/30/19 0751  GLUCAP 328* 350* 304* 253* 310*   Lipid Profile: No results for input(s): CHOL, HDL, LDLCALC, TRIG, CHOLHDL, LDLDIRECT in the last 72 hours. Thyroid Function Tests: No results for input(s): TSH, T4TOTAL, FREET4, T3FREE, THYROIDAB in the last 72 hours. Anemia Panel: No results for input(s): VITAMINB12, FOLATE, FERRITIN, TIBC, IRON, RETICCTPCT in the last 72 hours. Sepsis Labs: Recent Labs  Lab 11/26/19 0609 11/27/19 0532 11/27/19 1657 11/27/19 1935 11/28/19 0644 11/29/19 0515  PROCALCITON 1.08 1.34  --   --  1.09 0.86  LATICACIDVEN  --   --  1.6 1.3  --   --     Recent Results (from the past 240 hour(s))  SARS Coronavirus 2 by RT PCR (hospital order, performed in [facility] hospital lab) Nasopharyngeal Nasopharyngeal Swab      Status: Abnormal   Collection Time: 11/22/19  7:45 PM   Specimen: Nasopharyngeal Swab  Result Value Ref Range Status   SARS Coronavirus 2 POSITIVE (A) NEGATIVE Final    Comment: RESULT CALLED TO, READ BACK BY AND VERIFIED WITH: CHRIS BUCKNER 11/22/19 AT 2048 HS (NOTE) SARS-CoV-2 target nucleic acids are DETECTED  SARS-CoV-2 RNA is generally detectable in upper respiratory specimens  during the acute phase of infection.  Positive results are indicative  of the presence of the identified virus, but do not rule out  bacterial infection or co-infection with other pathogens not detected by the test.  Clinical correlation with patient history and  other diagnostic information is necessary to determine patient infection status.  The expected result is negative.  Fact Sheet for Patients:   StrictlyIdeas.no   Fact Sheet for Healthcare Providers:   BankingDealers.co.za    This test is not yet approved or cleared by the [place] FDA and  has been authorized for detection and/or diagnosis of SARS-CoV-2 by FDA under an Emergency Use Authorization (EUA).  This EUA will remain in effect (meaning this test  can be used) for the duration of  the COVID-19 declaration under Section 564(b)(1) of the Act, 21 U.S.C. section 360-bbb-3(b)(1), unless the authorization is terminated or revoked sooner.  Performed at [facility], [address] [place], [place] [address]   Urine Culture     Status: Abnormal   Collection Time: 11/22/19 11:25 PM   Specimen: Urine, Random  Result Value Ref Range Status   Specimen Description   Final    URINE, RANDOM Performed at [facility], [address] [place], [place] [address]    Special Requests   Final    NONE Performed at [facility], [address] [place], [place] [address]    Culture >=100,000 COLONIES/mL ESCHERICHIA COLI (A)  Final   Report Status 11/26/2019 FINAL  Final    Organism ID, Bacteria ESCHERICHIA COLI (A)  Final      Susceptibility   Escherichia coli - MIC*    AMPICILLIN <=2 SENSITIVE Sensitive     CEFAZOLIN <=4 SENSITIVE Sensitive     CEFTRIAXONE <=0.25 SENSITIVE Sensitive     CIPROFLOXACIN <=0.25 SENSITIVE Sensitive     GENTAMICIN <=1 SENSITIVE Sensitive     IMIPENEM <=0.25 SENSITIVE Sensitive     NITROFURANTOIN 64 INTERMEDIATE Intermediate     TRIMETH/SULFA <=20 SENSITIVE Sensitive     AMPICILLIN/SULBACTAM <=2 SENSITIVE Sensitive     PIP/TAZO <=4 SENSITIVE Sensitive     * >=100,000 COLONIES/mL ESCHERICHIA COLI  MRSA PCR Screening     Status: None   Collection Time: 11/23/19 11:26 PM   Specimen: Nasal Mucosa; Nasopharyngeal  Result Value Ref Range Status   MRSA by PCR NEGATIVE NEGATIVE Final    Comment:        The GeneXpert MRSA Assay (FDA approved for NASAL specimens only), is one component of a comprehensive MRSA colonization surveillance program. It is not intended to diagnose MRSA infection nor to guide or monitor treatment for MRSA infections. Performed at [facility], [address] [place], [place] [address]   CULTURE, BLOOD (ROUTINE X 2) w Reflex to ID Panel     Status: None (Preliminary result)   Collection Time: 11/27/19  4:57 PM   Specimen: BLOOD  Result Value Ref Range Status   Specimen Description BLOOD RAC  Final   Special Requests   Final    BOTTLES DRAWN AEROBIC AND ANAEROBIC Blood Culture adequate volume   Culture   Final    NO GROWTH 3 DAYS Performed at [facility], [address], [place],  [address]    Report Status PENDING  Incomplete  CULTURE, BLOOD (ROUTINE X 2) w Reflex to ID Panel     Status: None (Preliminary result)   Collection Time: 11/27/19  5:01 PM   Specimen: BLOOD  Result Value Ref Range Status   Specimen Description BLOOD [facility]  Final   Special Requests   Final    BOTTLES DRAWN  AEROBIC AND ANAEROBIC Blood Culture adequate volume   Culture   Final    NO GROWTH  3 DAYS Performed at [facility], [address] [place], [place] [address]    Report Status PENDING  Incomplete  MRSA PCR Screening     Status: None   Collection Time: 11/29/19 10:28 AM   Specimen: Nasopharyngeal  Result Value Ref Range Status   MRSA by PCR NEGATIVE NEGATIVE Final    Comment:        The GeneXpert MRSA Assay (FDA approved for NASAL specimens only), is one component of a comprehensive MRSA colonization surveillance program. It is not intended to diagnose MRSA infection nor to guide or monitor treatment for MRSA infections. Performed at [facility], [address], [place], [place] [address]      Radiology Studies: DG Chest [place] 1 View  Result Date: 11/28/2019 CLINICAL DATA:  Enteric catheter placement EXAM: PORTABLE CHEST 1 VIEW COMPARISON:  11/28/2019 at 2:44 p.m. FINDINGS: Single frontal view of the chest demonstrates enteric catheter weighted tip projecting over the gastric fundus. Cardiac silhouette is unremarkable. Multifocal bilateral airspace disease greatest at the left lung base unchanged. No effusion or pneumothorax. IMPRESSION: 1. Enteric catheter tip projects over gastric fundus. 2. Stable multifocal airspace disease compatible with COVID-19 pneumonia. Electronically Signed   By: Randa Ngo M.D.   On: 11/28/2019 16:54   DG Chest Port 1 View  Result Date: 11/28/2019 CLINICAL DATA:  Check nasogastric catheter placement EXAM: PORTABLE CHEST 1 VIEW COMPARISON:  11/26/2019 FINDINGS: Weighted feeding tube is noted within the stomach. Cardiac shadow is stable. Patchy opacities are again identified bilaterally. IMPRESSION: Weighted feeding tube within the stomach. Otherwise stable appearing chest. Electronically Signed   By: Inez Catalina M.D.   On: 11/28/2019 15:04   DG Abd Portable 1V  Result Date: 11/29/2019 CLINICAL DATA:  Nasogastric tube placement EXAM: PORTABLE ABDOMEN - 1 VIEW COMPARISON:  None similar FINDINGS: The enteric tube tip  and side-port overlaps the stomach. Gallstone seen over the right upper quadrant. Partially covered abdomen with normal bowel gas pattern. Streaky density at the left base that is similar to yesterday. Cardiomegaly. IMPRESSION: 1. Expected positioning of the nasogastric tube. 2. Cholelithiasis. Electronically Signed   By: Monte Fantasia M.D.   On: 11/29/2019 10:57    Scheduled Meds: . albuterol  2 puff Inhalation Q6H  . ARIPiprazole  30 mg Per Tube QHS  . vitamin C  500 mg Per Tube Daily  . aspirin  81 mg Per Tube Daily  . benztropine  0.5 mg Per Tube BID  . Chlorhexidine Gluconate Cloth  6 each Topical Daily  . cholecalciferol  1,000 Units Per Tube Daily  . diltiazem  30 mg Per Tube Q6H  . enoxaparin (LOVENOX) injection  40 mg Subcutaneous Q24H  . feeding supplement (PROSource TF)  45 mL Per Tube BID  . fluticasone  2 spray Each Nare Daily  . free water  250 mL Per Tube Q2H  . insulin aspart  0-20 Units Subcutaneous Q4H  . levothyroxine  25 mcg Per Tube QAC breakfast  . liver oil-zinc oxide   Topical TID  . mouth rinse  15 mL Mouth Rinse BID  . methylPREDNISolone (SOLU-MEDROL) injection  40 mg Intravenous Q12H  . pantoprazole (PROTONIX) IV  40 mg Intravenous QHS  . polyethylene glycol  17 g Oral Daily  . senna  1 tablet Per Tube QHS  . sertraline  100 mg Per Tube q AM  .  zinc sulfate  220 mg Oral Daily   Continuous Infusions: . ceFEPime (MAXIPIME) IV Stopped (11/29/19 2310)  . dextrose 75 mL/hr at 11/30/19 0600  . feeding supplement (JEVITY 1.5 CAL/FIBER) 1,000 mL (11/29/19 1657)     LOS: 8 days   Time spent: 35 minutes.  Lorella Nimrod, MD [facility]  If 7PM-7AM, please contact night-coverage [email or web address]  11/30/2019, 9:00 AM   This record has been created using Systems analyst. Errors have been sought and corrected,but may not always be located. Such creation errors do not reflect on the standard of care.

## 2019-12-01 LAB — BASIC METABOLIC PANEL
Anion gap: 11 (ref 5–15)
Anion gap: 4 — ABNORMAL LOW (ref 5–15)
Anion gap: 7 (ref 5–15)
Anion gap: 7 (ref 5–15)
BUN: 54 mg/dL — ABNORMAL HIGH (ref 6–20)
BUN: 54 mg/dL — ABNORMAL HIGH (ref 6–20)
BUN: 56 mg/dL — ABNORMAL HIGH (ref 6–20)
BUN: 57 mg/dL — ABNORMAL HIGH (ref 6–20)
CO2: 17 mmol/L — ABNORMAL LOW (ref 22–32)
CO2: 22 mmol/L (ref 22–32)
CO2: 23 mmol/L (ref 22–32)
CO2: 23 mmol/L (ref 22–32)
Calcium: 10.6 mg/dL — ABNORMAL HIGH (ref 8.9–10.3)
Calcium: 10.8 mg/dL — ABNORMAL HIGH (ref 8.9–10.3)
Calcium: 11.1 mg/dL — ABNORMAL HIGH (ref 8.9–10.3)
Calcium: 11.2 mg/dL — ABNORMAL HIGH (ref 8.9–10.3)
Chloride: 114 mmol/L — ABNORMAL HIGH (ref 98–111)
Chloride: 114 mmol/L — ABNORMAL HIGH (ref 98–111)
Chloride: 116 mmol/L — ABNORMAL HIGH (ref 98–111)
Chloride: 121 mmol/L — ABNORMAL HIGH (ref 98–111)
Creatinine, Ser: 1.27 mg/dL — ABNORMAL HIGH (ref 0.44–1.00)
Creatinine, Ser: 1.31 mg/dL — ABNORMAL HIGH (ref 0.44–1.00)
Creatinine, Ser: 1.32 mg/dL — ABNORMAL HIGH (ref 0.44–1.00)
Creatinine, Ser: 1.34 mg/dL — ABNORMAL HIGH (ref 0.44–1.00)
GFR calc Af Amer: 50 mL/min — ABNORMAL LOW (ref >60)
GFR calc Af Amer: 51 mL/min — ABNORMAL LOW (ref >60)
GFR calc Af Amer: 52 mL/min — ABNORMAL LOW (ref >60)
GFR calc Af Amer: 53 mL/min — ABNORMAL LOW (ref >60)
GFR calc non Af Amer: 43 mL/min — ABNORMAL LOW (ref >60)
GFR calc non Af Amer: 44 mL/min — ABNORMAL LOW (ref >60)
GFR calc non Af Amer: 44 mL/min — ABNORMAL LOW (ref >60)
GFR calc non Af Amer: 46 mL/min — ABNORMAL LOW (ref >60)
Glucose, Bld: 229 mg/dL — ABNORMAL HIGH (ref 70–99)
Glucose, Bld: 236 mg/dL — ABNORMAL HIGH (ref 70–99)
Glucose, Bld: 362 mg/dL — ABNORMAL HIGH (ref 70–99)
Glucose, Bld: 387 mg/dL — ABNORMAL HIGH (ref 70–99)
Potassium: 3.8 mmol/L (ref 3.5–5.1)
Potassium: 4.2 mmol/L (ref 3.5–5.1)
Potassium: 4.5 mmol/L (ref 3.5–5.1)
Potassium: 5.3 mmol/L — ABNORMAL HIGH (ref 3.5–5.1)
Sodium: 142 mmol/L (ref 135–145)
Sodium: 143 mmol/L (ref 135–145)
Sodium: 146 mmol/L — ABNORMAL HIGH (ref 135–145)
Sodium: 148 mmol/L — ABNORMAL HIGH (ref 135–145)

## 2019-12-01 LAB — GLUCOSE, CAPILLARY
Glucose-Capillary: 211 mg/dL — ABNORMAL HIGH (ref 70–99)
Glucose-Capillary: 225 mg/dL — ABNORMAL HIGH (ref 70–99)
Glucose-Capillary: 251 mg/dL — ABNORMAL HIGH (ref 70–99)
Glucose-Capillary: 346 mg/dL — ABNORMAL HIGH (ref 70–99)
Glucose-Capillary: 347 mg/dL — ABNORMAL HIGH (ref 70–99)
Glucose-Capillary: 362 mg/dL — ABNORMAL HIGH (ref 70–99)

## 2019-12-01 LAB — COMPREHENSIVE METABOLIC PANEL
ALT: 7 U/L (ref 0–44)
AST: 12 U/L — ABNORMAL LOW (ref 15–41)
Albumin: 2.5 g/dL — ABNORMAL LOW (ref 3.5–5.0)
Alkaline Phosphatase: 59 U/L (ref 38–126)
Anion gap: 7 (ref 5–15)
BUN: 56 mg/dL — ABNORMAL HIGH (ref 6–20)
CO2: 22 mmol/L (ref 22–32)
Calcium: 10.9 mg/dL — ABNORMAL HIGH (ref 8.9–10.3)
Chloride: 116 mmol/L — ABNORMAL HIGH (ref 98–111)
Creatinine, Ser: 1.26 mg/dL — ABNORMAL HIGH (ref 0.44–1.00)
GFR calc Af Amer: 54 mL/min — ABNORMAL LOW (ref >60)
GFR calc non Af Amer: 47 mL/min — ABNORMAL LOW (ref >60)
Glucose, Bld: 344 mg/dL — ABNORMAL HIGH (ref 70–99)
Potassium: 4.3 mmol/L (ref 3.5–5.1)
Sodium: 145 mmol/L (ref 135–145)
Total Bilirubin: 0.8 mg/dL (ref 0.3–1.2)
Total Protein: 5.6 g/dL — ABNORMAL LOW (ref 6.5–8.1)

## 2019-12-01 LAB — CBC
HCT: 41.1 % (ref 36.0–46.0)
Hemoglobin: 12.6 g/dL (ref 12.0–15.0)
MCH: 26.8 pg (ref 26.0–34.0)
MCHC: 30.7 g/dL (ref 30.0–36.0)
MCV: 87.4 fL (ref 80.0–100.0)
Platelets: 311 10*3/uL (ref 150–400)
RBC: 4.7 MIL/uL (ref 3.87–5.11)
RDW: 16.7 % — ABNORMAL HIGH (ref 11.5–15.5)
WBC: 20.6 10*3/uL — ABNORMAL HIGH (ref 4.0–10.5)
nRBC: 0 % (ref 0.0–0.2)

## 2019-12-01 LAB — PHOSPHORUS: Phosphorus: 3.1 mg/dL (ref 2.5–4.6)

## 2019-12-01 LAB — MAGNESIUM: Magnesium: 2.3 mg/dL (ref 1.7–2.4)

## 2019-12-01 MED ORDER — FREE WATER
100.0000 mL | Status: DC
  Administered 2019-12-01 – 2019-12-02 (×5): 100 mL

## 2019-12-01 MED ORDER — INSULIN DETEMIR 100 UNIT/ML ~~LOC~~ SOLN
8.0000 [IU] | Freq: Two times a day (BID) | SUBCUTANEOUS | Status: DC
  Administered 2019-12-01 – 2019-12-07 (×12): 8 [IU] via SUBCUTANEOUS
  Filled 2019-12-01 (×16): qty 0.08

## 2019-12-01 MED ORDER — METHYLPREDNISOLONE SODIUM SUCC 40 MG IJ SOLR
40.0000 mg | Freq: Every day | INTRAMUSCULAR | Status: DC
  Administered 2019-12-01 – 2019-12-02 (×2): 40 mg via INTRAVENOUS
  Filled 2019-12-01 (×2): qty 1

## 2019-12-01 MED ORDER — INSULIN ASPART 100 UNIT/ML ~~LOC~~ SOLN
0.0000 [IU] | SUBCUTANEOUS | Status: DC
  Administered 2019-12-02: 4 [IU] via SUBCUTANEOUS
  Administered 2019-12-02: 12:00:00 7 [IU] via SUBCUTANEOUS
  Administered 2019-12-02: 11 [IU] via SUBCUTANEOUS
  Administered 2019-12-02: 06:00:00 7 [IU] via SUBCUTANEOUS
  Administered 2019-12-02: 21:00:00 15 [IU] via SUBCUTANEOUS
  Administered 2019-12-02: 09:00:00 4 [IU] via SUBCUTANEOUS
  Administered 2019-12-03: 15 [IU] via SUBCUTANEOUS
  Administered 2019-12-03: 11 [IU] via SUBCUTANEOUS
  Administered 2019-12-03: 7 [IU] via SUBCUTANEOUS
  Administered 2019-12-03: 4 [IU] via SUBCUTANEOUS
  Administered 2019-12-03: 11 [IU] via SUBCUTANEOUS
  Administered 2019-12-03 – 2019-12-04 (×2): 7 [IU] via SUBCUTANEOUS
  Administered 2019-12-04 (×2): 4 [IU] via SUBCUTANEOUS
  Filled 2019-12-01 (×13): qty 1

## 2019-12-01 NOTE — Progress Notes (Signed)
PROGRESS NOTE    Elizabeth Gutierrez  HQI:[id number] DOB: 07-17-1960 DOA: 11/22/2019 PCP: Denton Lank, MD   Brief Narrative: Taken from H&P. The patient is a 59 yr old cognitively impaired woman who lives in a group home. She was diagnosed with COVID 19 on 11/10/2019.  past medical history significant for CHF (EF 30-35% from echo 09/27/2019), CAD (Hx MI), DM II, Hypertension, Hyperlipidemia, Paranoid Schizophrenia, Thyroid disease. Admitted for COVID-19 pneumonia, UTI with altered mental status.  Requiring up to 9 L with HFNC, uses 2 L at home. Completed the treatment with remdesivir and UTI. Developed hypernatremia and hypercalcemia, started improving with D5 and addition of free water with NG tube.  Subjective: Patient remained quite lethargic, although easily awake able and following some commands.  She was talking some but does not making sense. Failed swallow evaluation.  Assessment & Plan:   Principal Problem:   COVID-19 Active Problems:   Chronic systolic heart failure (HCC)   CAD in native artery   Controlled type 2 diabetes mellitus without complication (HCC)   Benign essential HTN   Altered mental state   Paranoid schizophrenia ([place])   Type 2 diabetes mellitus with hyperlipidemia (HCC)   Hypothyroidism   Acute respiratory failure with hypoxia (HCC)   Acute kidney injury superimposed on CKD (HCC)   Weakness   Hypercalcemia   Tachycardia   Goals of care, counseling/discussion   Palliative care by specialist   DNR (do not resuscitate) discussion   Malnutrition of moderate degree  Acute hypoxic respiratory failure secondary to COVID-19 pneumonia.   Patient showed some improvement in oxygenation today,able to wean to 4 L with saturation remained above 90%.  Baseline use of 2 L.  Completed the treatment for remdesivir.  Did receive baricitinib which was discontinued due to worsening leukocytosis.  Completed a course of Zithromax.  Elevated inflammatory markers which are now  improving. Elevated procalcitonin at 1.34.  MRSA nasal swab was negative. -Continue cefepime . -Continue Solu-Medrol daily dose-for another 2 more days. -Continue supportive care. -Continue supplements. -Palliative was consulted due to worsening encephalopathy-mother needs some time before making some decisions regarding CODE STATUS and further management.  Patient will remain full code till that time.  Acute metabolic encephalopathy.  Some improvement in mentation . Able to follow command , trying to speak but does not make much sense.  Failed swallow evaluation again today.  Found to have hypercalcimia and worsening hypernatremia which can contribute to encephalopathy.  Patient also has underlying psych illness and on multiple psych meds.  -Blood cultures remain negative with normal lactic acid. -Keep holding trazodone, lithium and Neurontin.  Hyperparathyroidism/ hypercalcemia.  Some improvement in calcium level, corrected calcium of 12.1 today. hypercalcemia labs with mildly decreased vitamin D and elevated parathyroid.  Protein electrophoresis within normal limits. Patient also has CKD stage IIIb.-Complete the course of Miacalcin. PTH related peptide pending. -Consult nephrology.-Appreciate their help. -Continue p.o. hydration.  Hypernatremia.  Started improving, corrected sodium of 149 today. NG tube was placed  and she was also started on free water along with D5.  Lithium was also discontinued as that can also contribute to hyponatremia. -Continue to monitor. -Discontinue D5 and continue with free water through tube.  HFrEF.  Patient has EF of 30 to 35% according to echo done in July 2021. -We will observe volume status as she needs IV fluid for worsening hypernatremia and hypercalcemia.  Diabetes mellitus.  Elevated CBG, improved as compared to before. -Continue frequent CBG check and insulin according  to sliding scale.  AKI with CKD stage IIIb.  Creatinine continue to improve  now. -Continue IV hydration. -Continue to monitor renal function. -Avoid nephrotoxins.  E. coli UTI.  Completed the course of antibiotics.  Acute urinary retention.  Patient developed acute urinary retention with abdominal pain yesterday requiring Foley catheter. -Continue to monitor-we will remove Foley catheter once some improvement in mental status.  Nursing concern of aspiration.  Most likely secondary to encephalopathy.  CT head was negative for any acute changes.  NG tube was inserted yesterday and she was started on tube feed. -Failed another swallow evaluation today. -Keep her n.p.o. till cleared from swallow team- -Patient will be high risk for refeeding syndrome-monitor electrolyte along with magnesium and phosphorus daily-currently stable.  Schizophrenia.  Patient lives in a group home.  Lithium within therapeutic range on admission.  Lithium and trazodone was discontinued due to worsening hypernatremia and mental status. -Continue rest of psych meds.  Neuropathy.  Her home dose of gabapentin is being held due to increased lethargy and altered mental status.  Hypothyroidism.  Due to TSH in hyperthyroid range her home dose of Synthroid was decreased during current hospitalization. -Continue Synthroid-patient will need a repeat TSH in 4 to 6 weeks.  Physical deconditioning/generalized weakness.  PT was ordered but she is unable to participate due to her mental status.  Objective: Vitals:   12/01/19 0300 12/01/19 0400 12/01/19 0500 12/01/19 0648  BP: 102/63 115/69 110/69 109/67  Pulse: 76 90 82   Resp: 20 (!) 24 (!) 28   Temp:      TempSrc:      SpO2: 93% 94% 91%   Weight:      Height:        Intake/Output Summary (Last 24 hours) at 12/01/2019 0733 Last data filed at 12/01/2019 0657 Gross per 24 hour  Intake 2547.63 ml  Output 3125 ml  Net -577.37 ml   Filed Weights   11/22/19 1933 11/29/19 1030 11/30/19 0410  Weight: 74.8 kg 79.9 kg 81.2 kg     Examination:  General.  Well-developed cognitively impaired lady, in no acute distress. Pulmonary.  Lungs clear bilaterally, normal respiratory effort. CV.  Regular rate and rhythm, no JVD, rub or murmur. Abdomen.  Soft, nontender, nondistended, BS positive. CNS.  Alert and oriented x3.  No focal neurologic deficit. Extremities.  No edema, no cyanosis, pulses intact and symmetrical. Psychiatry.  Judgment and insight appears impaired.  DVT prophylaxis: Lovenox Code Status: Full Family Communication: Mother was updated on phone. Disposition Plan:  Status is: Inpatient  Remains inpatient appropriate because:Inpatient level of care appropriate due to severity of illness   Dispo: The patient is from: Group home              Anticipated d/c is to: To be determined              Anticipated d/c date is: 2-3 days.              Patient currently is not medically stable to d/c.  Patient started showing some response but remained high risk for deterioration and death.  Mother would like to keep her full code at this time and okay to proceed with NG tube feeding.  Palliative care is involved.   Consultants:   Palliative  Nephrology  Procedures:   Antimicrobials:   Cefepime  Data Reviewed: I have personally reviewed following labs and imaging studies  CBC: Recent Labs  Lab 11/25/19 0624 11/25/19 0624 11/26/19 0609 11/27/19  0532 11/29/19 0515 11/30/19 0520 12/01/19 0545  WBC 5.8   < > 6.5 21.0* 26.2* 22.9* 20.6*  NEUTROABS 4.3  --  5.0 17.8*  --   --   --   HGB 12.7   < > 15.1* 16.1* 14.9 13.0 12.6  HCT 41.8   < > 50.0* 53.0* 51.3* 44.7 41.1  MCV 86.9   < > 88.2 87.7 90.3 89.0 87.4  PLT 317   < > 373 539* 477* 381 311   < > = values in this interval not displayed.   Basic Metabolic Panel: Recent Labs  Lab 11/29/19 0515 11/29/19 1716 11/29/19 2300 11/30/19 0520 11/30/19 1819 11/30/19 2353 12/01/19 0545  NA 162*   < > 149* 149* 146* 148* 145  K 4.3   < > 3.3*  3.9 3.9 3.8 4.3  CL 128*   < > 125* 121* 118* 121* 116*  CO2 23   < > 20* 20* 24 23 22   GLUCOSE 224*   < > 336* 345* 399* 229* 344*  BUN 85*   < > 67* 70* 60* 56* 56*  CREATININE 2.50*   < > 1.84* 1.67* 1.43* 1.27* 1.26*  CALCIUM 11.7*   < > 10.1 11.0* 11.0* 11.2* 10.9*  MG 2.8*  --   --  2.6*  --   --  2.3  PHOS 4.5  --   --  3.0  --   --  3.1   < > = values in this interval not displayed.   GFR: Estimated Creatinine Clearance: 48.5 mL/min (A) (by C-G formula based on SCr of 1.26 mg/dL (H)). Liver Function Tests: Recent Labs  Lab 11/27/19 0532 11/28/19 0644 11/29/19 0515 11/30/19 0520 12/01/19 0545  AST 13* 12* 10* 11* 12*  ALT 10 7 6 7 7   ALKPHOS 93 81 70 62 59  BILITOT 0.8 1.3* 1.0 0.7 0.8  PROT 8.3* 7.2 6.5 5.8* 5.6*  ALBUMIN 3.4* 3.1* 2.7* 2.5* 2.5*   No results for input(s): LIPASE, AMYLASE in the last 168 hours. No results for input(s): AMMONIA in the last 168 hours. Coagulation Profile: No results for input(s): INR, PROTIME in the last 168 hours. Cardiac Enzymes: No results for input(s): CKTOTAL, CKMB, CKMBINDEX, TROPONINI in the last 168 hours. BNP (last 3 results) No results for input(s): PROBNP in the last 8760 hours. HbA1C: No results for input(s): HGBA1C in the last 72 hours. CBG: Recent Labs  Lab 11/30/19 1632 11/30/19 1950 11/30/19 2307 12/01/19 0046 12/01/19 0312  GLUCAP 360* 351* 207* 225* 251*   Lipid Profile: No results for input(s): CHOL, HDL, LDLCALC, TRIG, CHOLHDL, LDLDIRECT in the last 72 hours. Thyroid Function Tests: No results for input(s): TSH, T4TOTAL, FREET4, T3FREE, THYROIDAB in the last 72 hours. Anemia Panel: No results for input(s): VITAMINB12, FOLATE, FERRITIN, TIBC, IRON, RETICCTPCT in the last 72 hours. Sepsis Labs: Recent Labs  Lab 11/26/19 0609 11/27/19 0532 11/27/19 1657 11/27/19 1935 11/28/19 0644 11/29/19 0515  PROCALCITON 1.08 1.34  --   --  1.09 0.86  LATICACIDVEN  --   --  1.6 1.3  --   --     Recent  Results (from the past 240 hour(s))  SARS Coronavirus 2 by RT PCR (hospital order, performed in [facility] hospital lab) Nasopharyngeal Nasopharyngeal Swab     Status: Abnormal   Collection Time: 11/22/19  7:45 PM   Specimen: Nasopharyngeal Swab  Result Value Ref Range Status   SARS Coronavirus 2 POSITIVE (A) NEGATIVE Final  Comment: RESULT CALLED TO, READ BACK BY AND VERIFIED WITH: CHRIS BUCKNER 11/22/19 AT 2048 HS (NOTE) SARS-CoV-2 target nucleic acids are DETECTED  SARS-CoV-2 RNA is generally detectable in upper respiratory specimens  during the acute phase of infection.  Positive results are indicative  of the presence of the identified virus, but do not rule out bacterial infection or co-infection with other pathogens not detected by the test.  Clinical correlation with patient history and  other diagnostic information is necessary to determine patient infection status.  The expected result is negative.  Fact Sheet for Patients:   StrictlyIdeas.no   Fact Sheet for Healthcare Providers:   BankingDealers.co.za    This test is not yet approved or cleared by the [place] FDA and  has been authorized for detection and/or diagnosis of SARS-CoV-2 by FDA under an Emergency Use Authorization (EUA).  This EUA will remain in effect (meaning this test  can be used) for the duration of  the COVID-19 declaration under Section 564(b)(1) of the Act, 21 U.S.C. section 360-bbb-3(b)(1), unless the authorization is terminated or revoked sooner.  Performed at [facility], [address] [place], [place] [address]   Urine Culture     Status: Abnormal   Collection Time: 11/22/19 11:25 PM   Specimen: Urine, Random  Result Value Ref Range Status   Specimen Description   Final    URINE, RANDOM Performed at [facility], [address] [place], [place] [address]    Special Requests   Final    NONE Performed at [facility], [address] [place], [place] [address]    Culture >=100,000 COLONIES/mL ESCHERICHIA COLI (A)  Final   Report Status 11/26/2019 FINAL  Final   Organism ID, Bacteria ESCHERICHIA COLI (A)  Final      Susceptibility   Escherichia coli - MIC*    AMPICILLIN <=2 SENSITIVE Sensitive     CEFAZOLIN <=4 SENSITIVE Sensitive     CEFTRIAXONE <=0.25 SENSITIVE Sensitive     CIPROFLOXACIN <=0.25 SENSITIVE Sensitive     GENTAMICIN <=1 SENSITIVE Sensitive     IMIPENEM <=0.25 SENSITIVE Sensitive     NITROFURANTOIN 64 INTERMEDIATE Intermediate     TRIMETH/SULFA <=20 SENSITIVE Sensitive     AMPICILLIN/SULBACTAM <=2 SENSITIVE Sensitive     PIP/TAZO <=4 SENSITIVE Sensitive     * >=100,000 COLONIES/mL ESCHERICHIA COLI  MRSA PCR Screening     Status: None   Collection Time: 11/23/19 11:26 PM   Specimen: Nasal Mucosa; Nasopharyngeal  Result Value Ref Range Status   MRSA by PCR NEGATIVE NEGATIVE Final    Comment:        The GeneXpert MRSA Assay (FDA approved for NASAL specimens only), is one component of a comprehensive MRSA colonization surveillance program. It is not intended to diagnose MRSA infection nor to guide or monitor treatment for MRSA infections. Performed at [facility], [address] [place], [place] [address]   CULTURE, BLOOD (ROUTINE X 2) w Reflex to ID Panel     Status: None (Preliminary result)   Collection Time: 11/27/19  4:57 PM   Specimen: BLOOD  Result Value Ref Range Status   Specimen Description BLOOD RAC  Final   Special Requests   Final    BOTTLES DRAWN AEROBIC AND ANAEROBIC Blood Culture adequate volume   Culture   Final    NO GROWTH 4 DAYS Performed at [facility], [address], [place],  [address]    Report Status PENDING  Incomplete  CULTURE, BLOOD (ROUTINE X 2) w Reflex to ID Panel     Status: None (Preliminary result)   Collection Time: 11/27/19  5:01 PM   Specimen: BLOOD  Result Value Ref Range Status    Specimen Description BLOOD [facility]  Final   Special Requests   Final    BOTTLES DRAWN AEROBIC AND ANAEROBIC Blood Culture adequate volume   Culture   Final    NO GROWTH 4 DAYS Performed at [facility], [address] [place], [place] [address]    Report Status PENDING  Incomplete  MRSA PCR Screening     Status: None   Collection Time: 11/29/19 10:28 AM   Specimen: Nasopharyngeal  Result Value Ref Range Status   MRSA by PCR NEGATIVE NEGATIVE Final    Comment:        The GeneXpert MRSA Assay (FDA approved for NASAL specimens only), is one component of a comprehensive MRSA colonization surveillance program. It is not intended to diagnose MRSA infection nor to guide or monitor treatment for MRSA infections. Performed at [facility], [address] [place], [place] [address]      Radiology Studies: DG Abd Portable 1V  Result Date: 11/29/2019 CLINICAL DATA:  Nasogastric tube placement EXAM: PORTABLE ABDOMEN - 1 VIEW COMPARISON:  None similar FINDINGS: The enteric tube tip and side-port overlaps the stomach. Gallstone seen over the right upper quadrant. Partially covered abdomen with normal bowel gas pattern. Streaky density at the left base that is similar to yesterday. Cardiomegaly. IMPRESSION: 1. Expected positioning of the nasogastric tube. 2. Cholelithiasis. Electronically Signed   By: Monte Fantasia M.D.   On: 11/29/2019 10:57    Scheduled Meds: . albuterol  2 puff Inhalation Q6H  . ARIPiprazole  30 mg Per Tube QHS  . vitamin C  500 mg Per Tube Daily  . aspirin  81 mg Per Tube Daily  . benztropine  0.5 mg Per Tube BID  . Chlorhexidine Gluconate Cloth  6 each Topical Daily  . cholecalciferol  1,000 Units Per Tube Daily  . diltiazem  30 mg Per Tube Q6H  . enoxaparin (LOVENOX) injection  40 mg Subcutaneous Q24H  . feeding supplement (PROSource TF)  45 mL Per Tube BID  . fluticasone  2 spray Each Nare Daily  . free water  250 mL Per Tube Q2H  . insulin  aspart  0-20 Units Subcutaneous Q4H  . insulin detemir  8 Units Subcutaneous QHS  . levothyroxine  25 mcg Per Tube QAC breakfast  . liver oil-zinc oxide   Topical TID  . mouth rinse  15 mL Mouth Rinse BID  . methylPREDNISolone (SOLU-MEDROL) injection  40 mg Intravenous Q12H  . pantoprazole (PROTONIX) IV  40 mg Intravenous QHS  . polyethylene glycol  17 g Oral Daily  . senna  1 tablet Per Tube QHS  . sertraline  100 mg Per Tube q AM  . zinc sulfate  220 mg Oral Daily   Continuous Infusions: . ceFEPime (MAXIPIME) IV Stopped (11/30/19 2342)  . feeding supplement (JEVITY 1.5 CAL/FIBER) 1,000 mL (12/01/19 0657)     LOS: 9 days   Time spent: 35 minutes.  Lorella Nimrod, MD [facility]  If 7PM-7AM, please contact night-coverage [email or web address]  12/01/2019, 7:33 AM   This record has been created using Dragon voice recognition software. Errors have been sought and corrected,but may not always be located. Such creation errors do not reflect on the standard of care.

## 2019-12-02 LAB — CBC
HCT: 42.7 % (ref 36.0–46.0)
Hemoglobin: 13 g/dL (ref 12.0–15.0)
MCH: 26.3 pg (ref 26.0–34.0)
MCHC: 30.4 g/dL (ref 30.0–36.0)
MCV: 86.3 fL (ref 80.0–100.0)
Platelets: 339 10*3/uL (ref 150–400)
RBC: 4.95 MIL/uL (ref 3.87–5.11)
RDW: 16.3 % — ABNORMAL HIGH (ref 11.5–15.5)
WBC: 20 10*3/uL — ABNORMAL HIGH (ref 4.0–10.5)
nRBC: 0 % (ref 0.0–0.2)

## 2019-12-02 LAB — COMPREHENSIVE METABOLIC PANEL
ALT: 9 U/L (ref 0–44)
AST: 13 U/L — ABNORMAL LOW (ref 15–41)
Albumin: 2.5 g/dL — ABNORMAL LOW (ref 3.5–5.0)
Alkaline Phosphatase: 59 U/L (ref 38–126)
Anion gap: 7 (ref 5–15)
BUN: 54 mg/dL — ABNORMAL HIGH (ref 6–20)
CO2: 25 mmol/L (ref 22–32)
Calcium: 10.8 mg/dL — ABNORMAL HIGH (ref 8.9–10.3)
Chloride: 115 mmol/L — ABNORMAL HIGH (ref 98–111)
Creatinine, Ser: 1.13 mg/dL — ABNORMAL HIGH (ref 0.44–1.00)
GFR calc Af Amer: >60 mL/min (ref >60)
GFR calc non Af Amer: 53 mL/min — ABNORMAL LOW (ref >60)
Glucose, Bld: 270 mg/dL — ABNORMAL HIGH (ref 70–99)
Potassium: 3.6 mmol/L (ref 3.5–5.1)
Sodium: 147 mmol/L — ABNORMAL HIGH (ref 135–145)
Total Bilirubin: 0.8 mg/dL (ref 0.3–1.2)
Total Protein: 5.6 g/dL — ABNORMAL LOW (ref 6.5–8.1)

## 2019-12-02 LAB — BASIC METABOLIC PANEL
Anion gap: 8 (ref 5–15)
BUN: 53 mg/dL — ABNORMAL HIGH (ref 6–20)
CO2: 22 mmol/L (ref 22–32)
Calcium: 10.4 mg/dL — ABNORMAL HIGH (ref 8.9–10.3)
Chloride: 116 mmol/L — ABNORMAL HIGH (ref 98–111)
Creatinine, Ser: 1.04 mg/dL — ABNORMAL HIGH (ref 0.44–1.00)
GFR calc Af Amer: >60 mL/min (ref >60)
GFR calc non Af Amer: 59 mL/min — ABNORMAL LOW (ref >60)
Glucose, Bld: 249 mg/dL — ABNORMAL HIGH (ref 70–99)
Potassium: 4 mmol/L (ref 3.5–5.1)
Sodium: 146 mmol/L — ABNORMAL HIGH (ref 135–145)

## 2019-12-02 LAB — CULTURE, BLOOD (ROUTINE X 2)
Culture: NO GROWTH
Culture: NO GROWTH
Special Requests: ADEQUATE
Special Requests: ADEQUATE

## 2019-12-02 LAB — GLUCOSE, CAPILLARY
Glucose-Capillary: 194 mg/dL — ABNORMAL HIGH (ref 70–99)
Glucose-Capillary: 243 mg/dL — ABNORMAL HIGH (ref 70–99)
Glucose-Capillary: 187 mg/dL — ABNORMAL HIGH (ref 70–99)
Glucose-Capillary: 231 mg/dL — ABNORMAL HIGH (ref 70–99)
Glucose-Capillary: 296 mg/dL — ABNORMAL HIGH (ref 70–99)
Glucose-Capillary: 313 mg/dL — ABNORMAL HIGH (ref 70–99)

## 2019-12-02 LAB — PHOSPHORUS: Phosphorus: 2.7 mg/dL (ref 2.5–4.6)

## 2019-12-02 LAB — MAGNESIUM: Magnesium: 2.6 mg/dL — ABNORMAL HIGH (ref 1.7–2.4)

## 2019-12-02 MED ORDER — FREE WATER
200.0000 mL | Status: DC
  Administered 2019-12-02 – 2019-12-03 (×13): 200 mL

## 2019-12-02 MED ORDER — METHYLPREDNISOLONE SODIUM SUCC 40 MG IJ SOLR
30.0000 mg | Freq: Every day | INTRAMUSCULAR | Status: DC
  Administered 2019-12-03 – 2019-12-05 (×3): 30 mg via INTRAVENOUS
  Filled 2019-12-02 (×2): qty 1

## 2019-12-02 NOTE — Evaluation (Addendum)
Clinical/Bedside Swallow Evaluation Patient Details  Name: Elizabeth Gutierrez MRN: [id number] Date of Birth: May 15, 1960  Today's Date: 12/02/2019 Time: SLP Start Time (ACUTE ONLY): 1500 SLP Stop Time (ACUTE ONLY): 1600 SLP Time Calculation (min) (ACUTE ONLY): 60 min  Past Medical History:  Past Medical History:  Diagnosis Date  . CHF (congestive heart failure) ([place])   . Coronary artery disease   . Diabetes ([place])   . Heart attack ([place])   . Hyperlipidemia   . Hypertension   . MR (mental retardation)    Mild  . Paranoid schizophrenia ([place])   . Thyroid disease    Past Surgical History:  Past Surgical History:  Procedure Laterality Date  . FOOT SURGERY Right    HPI:  Pt is a 59 yr old cognitively impaired (MR) woman who lives in a group home. She was diagnosed with COVID 19 on 11/10/2019. At admit, she was found to have altered mental status. She was saturating 77% on room air.  She has a past medical history significant for CHF (EF 30-35% from echo 09/27/2019), CAD (Hx MI), DM II, Hypertension, Hyperlipidemia, Paranoid Schizophrenia, Thyroid disease.  Pt is being followed by Palliative Care services; NGT for nutrition currently.   Assessment / Plan / Recommendation Clinical Impression  Pt appears to present w/ potential oropharyngeal phase dysphagia w/ increased risk for aspiration though this was difficult to fully determine in light of presence of NGT. Pt also has a Baseline MR/Cognitive decline w/ min inconsistent decreased awareness during oral phase of bolus management. These issues can increase risk for pharyngeal phase dysphagia. This was discussed w/ MD.  Pt consumed few po trials of both thin and Nectar consistency liquids, then few tsps trials of Puree w/ inconsistent but noted overt s/s of aspiration -- delayed coughing, multiple swallows. Pt also demonstrated intermittent decreased awareness of po boluses in her mouth w/ diffuse residue of the bolus laying there until she attended w/  lingual sweeping/swallowing to clear. Pt was able to help assist w/ feeding herself but needed Mod support and cues. OM exam appeared grossly WFL during bolus management and oral clearing; no unilateral weakness of face/mouth/tongue noted.  Pt appears to present w/ s/s of dysphagia w/ increased risk for aspiration impacted by declined Cognitive status; NGT present. MD consulted and will consider ordering an oral diet when the NGT can be removed. Recommend aspiration precautions and Supervision w/ all oral intake d/t Cognitive status. Recommend frequent oral care for hygiene and stimulation of swallowing. SLP Visit Diagnosis: Dysphagia, oropharyngeal phase (R13.12) (baseline MR)    Aspiration Risk  Mild aspiration risk;Moderate aspiration risk;Risk for inadequate nutrition/hydration    Diet Recommendation  NPO w/ MD consideration of Full Liquid diet of NECTAR liquid consistency when NGT can be removed; aspiration precautions; 100% Supervision w/ any oral intake  Medication Administration: Via alternative means (NGT)    Other  Recommendations Recommended Consults:  (Dietician and Palliative Care services following) Oral Care Recommendations: Oral care BID;Oral care before and after PO;Staff/trained caregiver to provide oral care Other Recommendations: Order thickener from pharmacy;Prohibited food (jello, ice cream, thin soups);Remove water pitcher;Have oral suction available   Follow up Recommendations Skilled Nursing facility (TBD)      Frequency and Duration min 3x week  2 weeks       Prognosis Prognosis for Safe Diet Advancement: Guarded Barriers to Reach Goals: Cognitive deficits;Time post onset;Severity of deficits      Swallow Study   General Date of Onset: 11/22/19 HPI: Pt is  a 60 yr old cognitively impaired (MR) woman who lives in a group home. She was diagnosed with COVID 19 on 11/10/2019. At admit, she was found to have altered mental status. She was saturating 77% on room air.   She has a past medical history significant for CHF (EF 30-35% from echo 09/27/2019), CAD (Hx MI), DM II, Hypertension, Hyperlipidemia, Paranoid Schizophrenia, Thyroid disease.  Pt is being followed by Palliative Care services; NGT for nutrition currently. Type of Study: Bedside Swallow Evaluation Previous Swallow Assessment: none indicated Diet Prior to this Study: NPO;NG Tube Temperature Spikes Noted: No (wbc 20.0) Respiratory Status: Nasal cannula (2-6 L) History of Recent Intubation: No Behavior/Cognition: Alert;Cooperative;Pleasant mood;Confused;Distractible;Requires cueing (baseline MR) Oral Cavity Assessment: Within Functional Limits Oral Care Completed by SLP: Yes Oral Cavity - Dentition: Missing dentition Vision:  (n/a) Self-Feeding Abilities: Total assist Patient Positioning: Upright in bed (needed complete positioning) Baseline Vocal Quality: Normal;Low vocal intensity (min) Volitional Cough: Cognitively unable to elicit Volitional Swallow: Unable to elicit    Oral/Motor/Sensory Function Overall Oral Motor/Sensory Function: Within functional limits (w/ bolus management and lingual movements)   Ice Chips Ice chips: Within functional limits Presentation: Spoon (fed; 8 trials)   Thin Liquid Thin Liquid: Impaired Presentation: Straw (4 trials) Oral Phase Impairments:  (adequate) Pharyngeal  Phase Impairments: Cough - Delayed (x2/4)    Nectar Thick Nectar Thick Liquid: Impaired Presentation: Straw (4 trials) Oral Phase Impairments:  (adequate) Pharyngeal Phase Impairments: Cough - Delayed (x1/4 trials)   Honey Thick Honey Thick Liquid: Not tested   Puree Puree: Impaired Presentation: Spoon (fed; 10 trials) Oral Phase Impairments: Poor awareness of bolus (x2) Oral Phase Functional Implications: Prolonged oral transit Pharyngeal Phase Impairments: Cough - Delayed (x2/10 trials)   Solid     Solid: Not tested       [name], MS, CCC-SLP Speech Language  Pathologist Rehab Services [phone] [facility] 12/02/2019,5:22 PM

## 2019-12-02 NOTE — Progress Notes (Addendum)
Daily Progress Note   Patient Name: Elizabeth Gutierrez       Date: 12/02/2019 DOB: May 03, 1960  Age: 59 y.o. MRN#: [id number] Attending Physician: Elizabeth Nimrod, MD Primary Care Physician: Denton Lank, MD Admit Date: 11/22/2019  Reason for Consultation/Follow-up: Establishing goals of care  Subjective: Patient is on covid isolation. Spoke with mother Blanch Media. She states she has been receiving updates. Attempted to discuss [place] and Blanch Media states she would like to continue all care possible at this time. Questioned what [place] would want and how she would feel; Blanch Media states that her daughter has had to adapt to a lot of things in her life she was not happy with, and will continue to adapt.   Full code/ full scope.    Length of Stay: 10  Current Medications: Scheduled Meds:  . albuterol  2 puff Inhalation Q6H  . ARIPiprazole  30 mg Per Tube QHS  . vitamin C  500 mg Per Tube Daily  . aspirin  81 mg Per Tube Daily  . benztropine  0.5 mg Per Tube BID  . Chlorhexidine Gluconate Cloth  6 each Topical Daily  . cholecalciferol  1,000 Units Per Tube Daily  . diltiazem  30 mg Per Tube Q6H  . enoxaparin (LOVENOX) injection  40 mg Subcutaneous Q24H  . feeding supplement (PROSource TF)  45 mL Per Tube BID  . fluticasone  2 spray Each Nare Daily  . free water  200 mL Per Tube Q2H  . insulin aspart  0-20 Units Subcutaneous Q4H  . insulin detemir  8 Units Subcutaneous BID  . levothyroxine  25 mcg Per Tube QAC breakfast  . liver oil-zinc oxide   Topical TID  . mouth rinse  15 mL Mouth Rinse BID  . methylPREDNISolone (SOLU-MEDROL) injection  40 mg Intravenous Daily  . pantoprazole (PROTONIX) IV  40 mg Intravenous QHS  . polyethylene glycol  17 g Oral Daily  . senna  1 tablet Per Tube QHS  . sertraline  100 mg  Per Tube q AM  . zinc sulfate  220 mg Oral Daily    Continuous Infusions: . ceFEPime (MAXIPIME) IV 2 g (12/02/19 1114)  . feeding supplement (JEVITY 1.5 CAL/FIBER) 1,000 mL (12/01/19 1935)    PRN Meds: acetaminophen, chlorpheniramine-HYDROcodone, dextromethorphan-guaiFENesin, docusate sodium, metoprolol tartrate  Vital Signs: BP 93/80 (BP Location: Left Arm)   Pulse 86   Temp 97.6 F (36.4 C) (Oral)   Resp 20   Ht 5\' 3"  (1.6 m)   Wt 81.4 kg   LMP  (LMP Unknown)   SpO2 93%   BMI 31.79 kg/m  SpO2: SpO2: 93 % O2 Device: O2 Device: Nasal Cannula O2 Flow Rate: O2 Flow Rate (L/min): 2 L/min  Intake/output summary:   Intake/Output Summary (Last 24 hours) at 12/02/2019 1228 Last data filed at 12/02/2019 0940 Gross per 24 hour  Intake 100 ml  Output 3350 ml  Net -3250 ml   LBM: Last BM Date:  (unknown) Baseline Weight: Weight: 74.8 kg Most recent weight: Weight: 81.4 kg       Palliative Assessment/Data:      Patient Active Problem List   Diagnosis Date Noted  . Malnutrition of moderate degree 11/28/2019  . Goals of care, counseling/discussion   . Palliative care by specialist   . DNR (do not resuscitate) discussion   . Hypercalcemia   . Tachycardia   . Weakness   . Acute kidney injury superimposed on CKD ([place])   . Acute cystitis with hematuria   . Acute respiratory failure with hypoxia ([place]) 11/22/2019  . COVID-19 11/22/2019  . Bilateral pneumonia 09/28/2019  . Shortness of breath 09/26/2019  . Hyperlipidemia   . Paranoid schizophrenia ([place])   . Type 2 diabetes mellitus with hyperlipidemia ([place])   . Hypothyroidism   . CKD (chronic kidney disease), stage IIIa   . Acute on chronic respiratory failure with hypoxia ([place])   . Acute metabolic encephalopathy 46/27/0350  . Altered mental state 08/17/2016  . DDD (degenerative disc disease), lumbar 07/09/2015  . Facet syndrome, lumbar 07/09/2015  . Lumbar radiculopathy 07/09/2015  . Sacroiliac joint  dysfunction 07/09/2015  . Spinal stenosis, lumbar region, 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  . Diabetic neuropathy ([place]) 07/09/2015  . CAD in native artery 06/16/2015  . Controlled type 2 diabetes mellitus without complication ([place]) 09/38/1829  . Benign essential HTN 06/16/2015  . Obstructive apnea 01/05/2015  . Esophagitis, reflux 08/06/2014  . Edema leg 05/14/2014  . Chronic systolic heart failure ([place]) 05/14/2014  . TI (tricuspid incompetence) 05/14/2014  . Chest pain 12/13/2013  . Combined fat and carbohydrate induced hyperlipemia 10/09/2013  . Breath shortness 10/09/2013    Palliative Care Assessment & Plan   Recommendations/Plan:  Full code/full scope.   Recommend palliative at D/C.   Code Status:    Code Status Orders  (From admission, onward)         Start     Ordered   11/22/19 2126  Full code  Continuous        11/22/19 2133        Code Status History    Date Active Date Inactive Code Status Order ID Comments User Context   09/26/2019 2048 10/03/2019 0504 Full Code [id number]  Nicolette Bang, DO Inpatient   09/19/2019 1440 09/21/2019 2017 Full Code [id number]  Ivor Costa, MD ED   03/20/2019 1534 03/22/2019 0038 Full Code [id number]  Samuella Cota, MD ED   08/17/2016 1518 08/19/2016 1601 Full Code [id number]  Max Sane, MD Inpatient   Advance Care Planning Activity       Prognosis:   Unable to determine  Thank you for allowing the Palliative Medicine Team to assist in the care of this patient.   Total Time 25 min Prolonged Time Billed no    COVID-19 DISASTER DECLARATION:  FULL CONTACT PHYSICAL EXAMINATION WAS NOT POSSIBLE DUE TO TREATMENT OF COVID-19  AND CONSERVATION OF PERSONAL PROTECTIVE EQUIPMENT.   Patient assessed or the symptoms described in the history of present illness.  In the context of the Global COVID-19 pandemic, which necessitated consideration that the patient might be at risk for infection with the SARS-CoV-2 virus  that causes COVID-19, Institutional protocols and algorithms that pertain to the evaluation of patients at risk for COVID-19 are in a state of rapid change based on information released by regulatory bodies including the CDC and federal and state organizations. These policies and algorithms were followed during the patient's care while in hospital.    Greater than 50%  of this time was spent counseling and coordinating care related to the above assessment and plan.  Asencion Gowda, NP  Please contact Palliative Medicine Team phone at [phone] for questions and concerns.

## 2019-12-02 NOTE — Progress Notes (Signed)
PROGRESS NOTE    Elizabeth Gutierrez  OYD:[id number] DOB: 13-Oct-1960 DOA: 11/22/2019 PCP: Denton Lank, MD   Brief Narrative: Taken from H&P. The patient is a 59 yr old cognitively impaired woman who lives in a group home. She was diagnosed with COVID 19 on 11/10/2019.  past medical history significant for CHF (EF 30-35% from echo 09/27/2019), CAD (Hx MI), DM II, Hypertension, Hyperlipidemia, Paranoid Schizophrenia, Thyroid disease. Admitted for COVID-19 pneumonia, UTI with altered mental status.  Requiring up to 9 L with HFNC, uses 2 L at home. Completed the treatment with remdesivir and UTI. Developed hypernatremia and hypercalcemia, started improving.  Subjective: Patient was little more alert today. Denies any complaints, want to drink some water.  Assessment & Plan:   Principal Problem:   COVID-19 Active Problems:   Chronic systolic heart failure (HCC)   CAD in native artery   Controlled type 2 diabetes mellitus without complication (HCC)   Benign essential HTN   Altered mental state   Paranoid schizophrenia ([place])   Type 2 diabetes mellitus with hyperlipidemia (HCC)   Hypothyroidism   Acute respiratory failure with hypoxia (HCC)   Acute kidney injury superimposed on CKD (HCC)   Weakness   Hypercalcemia   Tachycardia   Goals of care, counseling/discussion   Palliative care by specialist   DNR (do not resuscitate) discussion   Malnutrition of moderate degree  Acute hypoxic respiratory failure secondary to COVID-19 pneumonia.   Patient showed some improvement in oxygenation today,able to wean to 2 L with saturation remained above 90%. At baseline.  Completed the treatment for remdesivir.  Did receive baricitinib which was discontinued due to worsening leukocytosis.  Completed a course of Zithromax.  Elevated inflammatory markers which are now improving.  Elevated procalcitonin at 1.34.  MRSA nasal swab was negative. -Continue cefepime . -Start steroid taper. -Continue  supportive care. -Continue supplements. -Palliative was consulted due to worsening encephalopathy-mother needs some time before making some decisions regarding CODE STATUS and further management.  Patient will remain full code till that time.  Acute metabolic encephalopathy.  Some improvement in mentation . Able to follow command, asking for some water. Found to have hypercalcimia and worsening hypernatremia which can contribute to encephalopathy.  Patient also has underlying psych illness and on multiple psych meds.  -Blood cultures remain negative with normal lactic acid. -Keep holding trazodone, lithium and Neurontin. -Repeat swallow evaluation.  Hyperparathyroidism/ hypercalcemia.  Some improvement in calcium level, corrected calcium of 12.0 today. hypercalcemia labs with mildly decreased vitamin D and elevated parathyroid.  Protein electrophoresis within normal limits. Patient also has CKD stage IIIb.-Complete the course of Miacalcin. PTH related peptide pending. -Consult nephrology.-Appreciate their help. -Continue p.o. hydration.  Hypernatremia.  Lithium was also discontinued as that can also contribute to hyponatremia.  Corrected sodium of 150 today.  Overnight nurse decrease the rate of free water through to go up to 100 cc every 2 hourly instead of 250 thinking that she is a heart failure patient.  Free water deficit today was 2.9L. -Make free water through NG tube to 200 cc every 2 hourly for another day and monitor.  HFrEF.  Patient has EF of 30 to 35% according to echo done in July 2021. -We will observe volume status as she needs IV fluid for worsening hypernatremia and hypercalcemia.  Diabetes mellitus.  Elevated CBG, improved as compared to before. -Continue frequent CBG check and insulin according to sliding scale.  AKI with CKD stage IIIb.  Creatinine continue to improve now. -  Continue IV hydration. -Continue to monitor renal function. -Avoid nephrotoxins.  E. coli  UTI.  Completed the course of antibiotics.  Acute urinary retention.  Patient developed acute urinary retention with abdominal pain yesterday requiring Foley catheter. -Remove Foley catheter and give her a voiding trial.  Nursing concern of aspiration.  Most likely secondary to encephalopathy.  CT head was negative for any acute changes.  NG tube was inserted yesterday and she was started on tube feed. -Repeat swallow evaluation. -Keep her n.p.o. till cleared from swallow team- -Patient will be high risk for refeeding syndrome-monitor electrolyte along with magnesium and phosphorus daily-currently stable.  Schizophrenia.  Patient lives in a group home.  Lithium within therapeutic range on admission.  Lithium and trazodone was discontinued due to worsening hypernatremia and mental status. -Continue rest of psych meds.  Neuropathy.  Her home dose of gabapentin is being held due to increased lethargy and altered mental status.  Hypothyroidism.  Due to TSH in hyperthyroid range her home dose of Synthroid was decreased during current hospitalization. -Continue Synthroid-patient will need a repeat TSH in 4 to 6 weeks.  Physical deconditioning/generalized weakness.  PT was ordered but she is unable to participate due to her mental status.  Objective: Vitals:   12/01/19 1910 12/02/19 0400 12/02/19 0500 12/02/19 0750  BP: 117/62 113/64  111/67  Pulse: 98 98  89  Resp:  (!) 21  20  Temp: 98.7 F (37.1 C) 97.8 F (36.6 C)  97.8 F (36.6 C)  TempSrc: Axillary Axillary  Oral  SpO2: 94% 91%  91%  Weight:   81.4 kg   Height:        Intake/Output Summary (Last 24 hours) at 12/02/2019 0802 Last data filed at 12/01/2019 1628 Gross per 24 hour  Intake 235.05 ml  Output 1300 ml  Net -1064.95 ml   Filed Weights   11/29/19 1030 11/30/19 0410 12/02/19 0500  Weight: 79.9 kg 81.2 kg 81.4 kg    Examination:  General.  Cognitively impaired lady, in no acute distress. Pulmonary.  Lungs clear  bilaterally, normal respiratory effort. CV.  Regular rate and rhythm, no JVD, rub or murmur. Abdomen.  Soft, nontender, nondistended, BS positive. CNS.  Alert and oriented to self only.  No focal neurologic deficit. Extremities.  No edema, no cyanosis, pulses intact and symmetrical. Psychiatry.  Judgment and insight appears normal.  DVT prophylaxis: Lovenox Code Status: Full Family Communication: Mother was updated on phone. Disposition Plan:  Status is: Inpatient  Remains inpatient appropriate because:Inpatient level of care appropriate due to severity of illness   Dispo: The patient is from: Group home              Anticipated d/c is to: To be determined              Anticipated d/c date is: 2-3 days.              Patient currently is not medically stable to d/c.  Patient started showing some response but remained high risk for deterioration and death.  Mother would like to keep her full code at this time and okay to proceed with NG tube feeding.  Palliative care is involved.   Consultants:   Palliative  Nephrology  Procedures:   Antimicrobials:   Cefepime  Data Reviewed: I have personally reviewed following labs and imaging studies  CBC: Recent Labs  Lab 11/26/19 0609 11/26/19 0609 11/27/19 0532 11/29/19 0515 11/30/19 0520 12/01/19 0545 12/02/19 0508  WBC 6.5   < >  21.0* 26.2* 22.9* 20.6* 20.0*  NEUTROABS 5.0  --  17.8*  --   --   --   --   HGB 15.1*   < > 16.1* 14.9 13.0 12.6 13.0  HCT 50.0*   < > 53.0* 51.3* 44.7 41.1 42.7  MCV 88.2   < > 87.7 90.3 89.0 87.4 86.3  PLT 373   < > 539* 477* 381 311 339   < > = values in this interval not displayed.   Basic Metabolic Panel: Recent Labs  Lab 11/29/19 0515 11/29/19 1716 11/30/19 0520 11/30/19 1819 12/01/19 0545 12/01/19 1255 12/01/19 1731 12/01/19 2312 12/02/19 0508  NA 162*   < > 149*   < > 145 143 142 146* 147*  K 4.3   < > 3.9   < > 4.3 4.5 5.3* 4.2 3.6  CL 128*   < > 121*   < > 116* 114* 114* 116*  115*  CO2 23   < > 20*   < > 22 22 17* 23 25  GLUCOSE 224*   < > 345*   < > 344* 387* 362* 236* 270*  BUN 85*   < > 70*   < > 56* 57* 54* 54* 54*  CREATININE 2.50*   < > 1.67*   < > 1.26* 1.34* 1.32* 1.31* 1.13*  CALCIUM 11.7*   < > 11.0*   < > 10.9* 10.6* 10.8* 11.1* 10.8*  MG 2.8*  --  2.6*  --  2.3  --   --   --  2.6*  PHOS 4.5  --  3.0  --  3.1  --   --   --  2.7   < > = values in this interval not displayed.   GFR: Estimated Creatinine Clearance: 54.2 mL/min (A) (by C-G formula based on SCr of 1.13 mg/dL (H)). Liver Function Tests: Recent Labs  Lab 11/28/19 0644 11/29/19 0515 11/30/19 0520 12/01/19 0545 12/02/19 0508  AST 12* 10* 11* 12* 13*  ALT 7 6 7 7 9   ALKPHOS 81 70 62 59 59  BILITOT 1.3* 1.0 0.7 0.8 0.8  PROT 7.2 6.5 5.8* 5.6* 5.6*  ALBUMIN 3.1* 2.7* 2.5* 2.5* 2.5*   No results for input(s): LIPASE, AMYLASE in the last 168 hours. No results for input(s): AMMONIA in the last 168 hours. Coagulation Profile: No results for input(s): INR, PROTIME in the last 168 hours. Cardiac Enzymes: No results for input(s): CKTOTAL, CKMB, CKMBINDEX, TROPONINI in the last 168 hours. BNP (last 3 results) No results for input(s): PROBNP in the last 8760 hours. HbA1C: No results for input(s): HGBA1C in the last 72 hours. CBG: Recent Labs  Lab 12/01/19 1221 12/01/19 1625 12/01/19 2116 12/02/19 0137 12/02/19 0528  GLUCAP 347* 362* 211* 194* 243*   Lipid Profile: No results for input(s): CHOL, HDL, LDLCALC, TRIG, CHOLHDL, LDLDIRECT in the last 72 hours. Thyroid Function Tests: No results for input(s): TSH, T4TOTAL, FREET4, T3FREE, THYROIDAB in the last 72 hours. Anemia Panel: No results for input(s): VITAMINB12, FOLATE, FERRITIN, TIBC, IRON, RETICCTPCT in the last 72 hours. Sepsis Labs: Recent Labs  Lab 11/26/19 0609 11/27/19 0532 11/27/19 1657 11/27/19 1935 11/28/19 0644 11/29/19 0515  PROCALCITON 1.08 1.34  --   --  1.09 0.86  LATICACIDVEN  --   --  1.6 1.3  --   --      Recent Results (from the past 240 hour(s))  SARS Coronavirus 2 by RT PCR (hospital order, performed in [facility] hospital  lab) Nasopharyngeal Nasopharyngeal Swab     Status: Abnormal   Collection Time: 11/22/19  7:45 PM   Specimen: Nasopharyngeal Swab  Result Value Ref Range Status   SARS Coronavirus 2 POSITIVE (A) NEGATIVE Final    Comment: RESULT CALLED TO, READ BACK BY AND VERIFIED WITH: CHRIS BUCKNER 11/22/19 AT 2048 HS (NOTE) SARS-CoV-2 target nucleic acids are DETECTED  SARS-CoV-2 RNA is generally detectable in upper respiratory specimens  during the acute phase of infection.  Positive results are indicative  of the presence of the identified virus, but do not rule out bacterial infection or co-infection with other pathogens not detected by the test.  Clinical correlation with patient history and  other diagnostic information is necessary to determine patient infection status.  The expected result is negative.  Fact Sheet for Patients:   StrictlyIdeas.no   Fact Sheet for Healthcare Providers:   BankingDealers.co.za    This test is not yet approved or cleared by the [place] FDA and  has been authorized for detection and/or diagnosis of SARS-CoV-2 by FDA under an Emergency Use Authorization (EUA).  This EUA will remain in effect (meaning this test  can be used) for the duration of  the COVID-19 declaration under Section 564(b)(1) of the Act, 21 U.S.C. section 360-bbb-3(b)(1), unless the authorization is terminated or revoked sooner.  Performed at [facility], [address] [place], [place] [address]   Urine Culture     Status: Abnormal   Collection Time: 11/22/19 11:25 PM   Specimen: Urine, Random  Result Value Ref Range Status   Specimen Description   Final    URINE, RANDOM Performed at [facility], [address] [place], [place] [address]    Special Requests   Final     NONE Performed at [facility], [address] [place], [place] [address]    Culture >=100,000 COLONIES/mL ESCHERICHIA COLI (A)  Final   Report Status 11/26/2019 FINAL  Final   Organism ID, Bacteria ESCHERICHIA COLI (A)  Final      Susceptibility   Escherichia coli - MIC*    AMPICILLIN <=2 SENSITIVE Sensitive     CEFAZOLIN <=4 SENSITIVE Sensitive     CEFTRIAXONE <=0.25 SENSITIVE Sensitive     CIPROFLOXACIN <=0.25 SENSITIVE Sensitive     GENTAMICIN <=1 SENSITIVE Sensitive     IMIPENEM <=0.25 SENSITIVE Sensitive     NITROFURANTOIN 64 INTERMEDIATE Intermediate     TRIMETH/SULFA <=20 SENSITIVE Sensitive     AMPICILLIN/SULBACTAM <=2 SENSITIVE Sensitive     PIP/TAZO <=4 SENSITIVE Sensitive     * >=100,000 COLONIES/mL ESCHERICHIA COLI  MRSA PCR Screening     Status: None   Collection Time: 11/23/19 11:26 PM   Specimen: Nasal Mucosa; Nasopharyngeal  Result Value Ref Range Status   MRSA by PCR NEGATIVE NEGATIVE Final    Comment:        The GeneXpert MRSA Assay (FDA approved for NASAL specimens only), is one component of a comprehensive MRSA colonization surveillance program. It is not intended to diagnose MRSA infection nor to guide or monitor treatment for MRSA infections. Performed at [facility], [address] [place],  [address]   CULTURE, BLOOD (ROUTINE X 2) w Reflex to ID Panel     Status: None   Collection Time: 11/27/19  4:57 PM   Specimen: BLOOD  Result Value Ref Range Status   Specimen Description BLOOD RAC  Final   Special Requests   Final    BOTTLES DRAWN  AEROBIC AND ANAEROBIC Blood Culture adequate volume   Culture   Final    NO GROWTH 5 DAYS Performed at [facility], [address] [place], [place] [address]    Report Status 12/02/2019 FINAL  Final  CULTURE, BLOOD (ROUTINE X 2) w Reflex to ID Panel     Status: None   Collection Time: 11/27/19  5:01 PM   Specimen: BLOOD  Result Value Ref Range Status   Specimen  Description BLOOD [facility]  Final   Special Requests   Final    BOTTLES DRAWN AEROBIC AND ANAEROBIC Blood Culture adequate volume   Culture   Final    NO GROWTH 5 DAYS Performed at [facility], [address] [place], [place] [address]    Report Status 12/02/2019 FINAL  Final  MRSA PCR Screening     Status: None   Collection Time: 11/29/19 10:28 AM   Specimen: Nasopharyngeal  Result Value Ref Range Status   MRSA by PCR NEGATIVE NEGATIVE Final    Comment:        The GeneXpert MRSA Assay (FDA approved for NASAL specimens only), is one component of a comprehensive MRSA colonization surveillance program. It is not intended to diagnose MRSA infection nor to guide or monitor treatment for MRSA infections. Performed at [facility], [address] [place], [place] [address]      Radiology Studies: No results found.  Scheduled Meds:  albuterol  2 puff Inhalation Q6H   ARIPiprazole  30 mg Per Tube QHS   vitamin C  500 mg Per Tube Daily   aspirin  81 mg Per Tube Daily   benztropine  0.5 mg Per Tube BID   Chlorhexidine Gluconate Cloth  6 each Topical Daily   cholecalciferol  1,000 Units Per Tube Daily   diltiazem  30 mg Per Tube Q6H   enoxaparin (LOVENOX) injection  40 mg Subcutaneous Q24H   feeding supplement (PROSource TF)  45 mL Per Tube BID   fluticasone  2 spray Each Nare Daily   free water  200 mL Per Tube Q2H   insulin aspart  0-20 Units Subcutaneous Q4H   insulin detemir  8 Units Subcutaneous BID   levothyroxine  25 mcg Per Tube QAC breakfast   liver oil-zinc oxide   Topical TID   mouth rinse  15 mL Mouth Rinse BID   methylPREDNISolone (SOLU-MEDROL) injection  40 mg Intravenous Daily   pantoprazole (PROTONIX) IV  40 mg Intravenous QHS   polyethylene glycol  17 g Oral Daily   senna  1 tablet Per Tube QHS   sertraline  100 mg Per Tube q AM   zinc sulfate  220 mg Oral Daily   Continuous Infusions:  ceFEPime (MAXIPIME) IV  Stopped (12/02/19 0030)   feeding supplement (JEVITY 1.5 CAL/FIBER) 1,000 mL (12/01/19 1935)     LOS: 10 days   Time spent: 30 minutes.  Lorella Nimrod, MD [facility]  If 7PM-7AM, please contact night-coverage [email or web address]  12/02/2019, 8:02 AM   This record has been created using Systems analyst. Errors have been sought and corrected,but may not always be located. Such creation errors do not reflect on the standard of care.

## 2019-12-02 NOTE — Progress Notes (Signed)
On assessment patient seems comfortable. NG tube is intact to left nare, infusing 55cc of jevity an hour with 200cc of flush every 2 hours. She is not exhibiting signs of distress or pain. Her oxygen level ranges from 88-92% on 2-3L [place]. Bed in low position and call bell in reach.

## 2019-12-03 LAB — PHOSPHORUS: Phosphorus: 2.4 mg/dL — ABNORMAL LOW (ref 2.5–4.6)

## 2019-12-03 LAB — COMPREHENSIVE METABOLIC PANEL
ALT: 13 U/L (ref 0–44)
AST: 16 U/L (ref 15–41)
Albumin: 2.8 g/dL — ABNORMAL LOW (ref 3.5–5.0)
Alkaline Phosphatase: 65 U/L (ref 38–126)
Anion gap: 9 (ref 5–15)
BUN: 45 mg/dL — ABNORMAL HIGH (ref 6–20)
CO2: 25 mmol/L (ref 22–32)
Calcium: 11 mg/dL — ABNORMAL HIGH (ref 8.9–10.3)
Chloride: 114 mmol/L — ABNORMAL HIGH (ref 98–111)
Creatinine, Ser: 1.14 mg/dL — ABNORMAL HIGH (ref 0.44–1.00)
GFR calc Af Amer: >60 mL/min (ref >60)
GFR calc non Af Amer: 53 mL/min — ABNORMAL LOW (ref >60)
Glucose, Bld: 138 mg/dL — ABNORMAL HIGH (ref 70–99)
Potassium: 3.5 mmol/L (ref 3.5–5.1)
Sodium: 148 mmol/L — ABNORMAL HIGH (ref 135–145)
Total Bilirubin: 0.8 mg/dL (ref 0.3–1.2)
Total Protein: 6.3 g/dL — ABNORMAL LOW (ref 6.5–8.1)

## 2019-12-03 LAB — GLUCOSE, CAPILLARY
Glucose-Capillary: 171 mg/dL — ABNORMAL HIGH (ref 70–99)
Glucose-Capillary: 204 mg/dL — ABNORMAL HIGH (ref 70–99)
Glucose-Capillary: 207 mg/dL — ABNORMAL HIGH (ref 70–99)
Glucose-Capillary: 243 mg/dL — ABNORMAL HIGH (ref 70–99)
Glucose-Capillary: 277 mg/dL — ABNORMAL HIGH (ref 70–99)
Glucose-Capillary: 286 mg/dL — ABNORMAL HIGH (ref 70–99)
Glucose-Capillary: 310 mg/dL — ABNORMAL HIGH (ref 70–99)

## 2019-12-03 LAB — CBC
HCT: 45.7 % (ref 36.0–46.0)
Hemoglobin: 14 g/dL (ref 12.0–15.0)
MCH: 26.3 pg (ref 26.0–34.0)
MCHC: 30.6 g/dL (ref 30.0–36.0)
MCV: 85.7 fL (ref 80.0–100.0)
Platelets: 396 10*3/uL (ref 150–400)
RBC: 5.33 MIL/uL — ABNORMAL HIGH (ref 3.87–5.11)
RDW: 15.9 % — ABNORMAL HIGH (ref 11.5–15.5)
WBC: 23 10*3/uL — ABNORMAL HIGH (ref 4.0–10.5)
nRBC: 0 % (ref 0.0–0.2)

## 2019-12-03 LAB — PTH-RELATED PEPTIDE: PTH-related peptide: <2 pmol/L

## 2019-12-03 LAB — MAGNESIUM: Magnesium: 2.7 mg/dL — ABNORMAL HIGH (ref 1.7–2.4)

## 2019-12-03 MED ORDER — CARVEDILOL 3.125 MG PO TABS
3.1250 mg | ORAL_TABLET | Freq: Two times a day (BID) | ORAL | Status: DC
  Administered 2019-12-03 – 2019-12-07 (×8): 3.125 mg
  Filled 2019-12-03 (×9): qty 1

## 2019-12-03 MED ORDER — ZINC SULFATE 220 (50 ZN) MG PO CAPS
220.0000 mg | ORAL_CAPSULE | Freq: Every day | ORAL | Status: DC
  Administered 2019-12-03 – 2019-12-07 (×5): 220 mg
  Filled 2019-12-03 (×4): qty 1

## 2019-12-03 MED ORDER — ACETAMINOPHEN 160 MG/5ML PO SOLN
650.0000 mg | Freq: Four times a day (QID) | ORAL | Status: DC | PRN
  Filled 2019-12-03: qty 20.3

## 2019-12-03 MED ORDER — DOCUSATE SODIUM 50 MG/5ML PO LIQD
100.0000 mg | Freq: Two times a day (BID) | ORAL | Status: DC | PRN
  Filled 2019-12-03: qty 10

## 2019-12-03 MED ORDER — POLYETHYLENE GLYCOL 3350 17 G PO PACK: 17.0000 g | PACK | Freq: Every day | ORAL | Status: DC

## 2019-12-03 MED ORDER — POLYETHYLENE GLYCOL 3350 17 G PO PACK
17.0000 g | PACK | Freq: Every day | ORAL | Status: DC
  Administered 2019-12-03 – 2019-12-07 (×3): 17 g
  Filled 2019-12-03 (×3): qty 1

## 2019-12-03 MED ORDER — CARVEDILOL 3.125 MG PO TABS: 3.1250 mg | ORAL_TABLET | Freq: Two times a day (BID) | ORAL | Status: DC

## 2019-12-03 MED ORDER — DEXTROMETHORPHAN-GUAIFENESIN 10-100 MG/5ML PO LIQD
5.0000 mL | ORAL | Status: DC | PRN
  Filled 2019-12-03 (×2): qty 5

## 2019-12-03 MED ORDER — FUROSEMIDE 10 MG/ML IJ SOLN: 40.0000 mg | Freq: Once | INTRAMUSCULAR | Status: DC

## 2019-12-03 MED ORDER — FREE WATER
250.0000 mL | Status: DC
  Administered 2019-12-03 – 2019-12-04 (×12): 250 mL

## 2019-12-03 MED ORDER — ZINC SULFATE 220 (50 ZN) MG PO CAPS: 220.0000 mg | ORAL_CAPSULE | Freq: Every day | ORAL | Status: DC

## 2019-12-03 MED ORDER — HYDROCOD POLST-CPM POLST ER 10-8 MG/5ML PO SUER: 5.0000 mL | Freq: Two times a day (BID) | ORAL | Status: DC | PRN

## 2019-12-03 NOTE — Progress Notes (Addendum)
Pt has urinated heavily multiple times tonight since removal of foley catheter @1630 . Upon entering room pt was scrunched up at the end of the bed, all linens soiled. [place] removed by pt. Sating in the 80s. [place] placed back on pt @ 2L. O2 sating @92 % within a few minutes of replacing. This Probation officer and NT changed bed linens. Pt cleaned or all urine. Soiled sacral pad changed. Purewick placed on pt to help decrease moisture, but pt continually removes it. All wires and tubes placed where pt can't see them, or taped to gown to prevent pulling.  Bed in lowest position. HOB @ 30 degrees with continuous NGT feedings running. Call bell within reach. Bedside table within reach. All lights turned off to help promote rest.

## 2019-12-03 NOTE — Progress Notes (Signed)
PROGRESS NOTE    Elizabeth Gutierrez  RRN:[id number] DOB: Jun 26, 1960 DOA: 11/22/2019 PCP: Denton Lank, MD   Brief Narrative: Taken from H&P. The patient is a 59 yr old cognitively impaired woman who lives in a group home. She was diagnosed with COVID 19 on 11/10/2019.  past medical history significant for CHF (EF 30-35% from echo 09/27/2019), CAD (Hx MI), DM II, Hypertension, Hyperlipidemia, Paranoid Schizophrenia, Thyroid disease. Admitted for COVID-19 pneumonia, UTI with altered mental status.  Requiring up to 9 L with HFNC, uses 2 L at home. Completed the treatment with remdesivir and UTI. Developed hypernatremia and hypercalcemia, started improving.  Subjective: Overnight nursing concern that patient was pulling on her wires.  She removed her pure wick resulted in soilage of lenin. Failed another swallow evaluation yesterday secondary to cognitive impairment.  They are recommending nectar thick liquids with 100% supervision a fine to take something oral at this time. Little more alert when seen today. Asking for water.  Assessment & Plan:   Principal Problem:   COVID-19 Active Problems:   Chronic systolic heart failure (HCC)   CAD in native artery   Controlled type 2 diabetes mellitus without complication (HCC)   Benign essential HTN   Altered mental state   Paranoid schizophrenia ([place])   Type 2 diabetes mellitus with hyperlipidemia (HCC)   Hypothyroidism   Acute respiratory failure with hypoxia (HCC)   Acute kidney injury superimposed on CKD (HCC)   Weakness   Hypercalcemia   Tachycardia   Goals of care, counseling/discussion   Palliative care by specialist   DNR (do not resuscitate) discussion   Malnutrition of moderate degree  Acute hypoxic respiratory failure secondary to COVID-19 pneumonia.   Patient is at baseline for her oxygen requirement now.  Saturating well on 2 L.  Completed the treatment for remdesivir.  Did receive baricitinib which was discontinued due to  worsening leukocytosis.  Completed a course of Zithromax.  Elevated inflammatory markers which are now improving.  Elevated procalcitonin at 1.34.  MRSA nasal swab was negative. -Continue cefepime -for total of 7 days. -Start steroid taper. -Continue supportive care. -Continue supplements. -Palliative was consulted due to worsening encephalopathy-mother needs some time before making some decisions regarding CODE STATUS and further management.  Patient will remain full code till that time.  Acute metabolic encephalopathy.  Some improvement in mentation . Able to follow command, asking for some water. Found to have hypercalcimia and worsening hypernatremia which can contribute to encephalopathy.  Patient also has underlying psych illness and on multiple psych meds.  -Blood cultures remain negative with normal lactic acid. -Keep holding trazodone, lithium and Neurontin. -Repeat swallow evaluation.  Hyperparathyroidism/ hypercalcemia.  Some improvement in calcium level, corrected calcium of 12.0 today. hypercalcemia labs with mildly decreased vitamin D and elevated parathyroid.  Protein electrophoresis within normal limits. Patient also has CKD stage IIIb.-Complete the course of Miacalcin. PTH related peptide pending. -Consult nephrology.-Appreciate their help. -Continue p.o. hydration.  Hypernatremia.  Lithium was also discontinued as that can also contribute to hypernatremia.  Corrected sodium of 148 today.  -Increase free water through NG tube to 250 cc every 2 hourly for another day and monitor.  HFrEF.  Patient has EF of 30 to 35% according to echo done in July 2021. -We will observe volume status as she needs IV fluid for worsening hypernatremia and hypercalcemia.  Diabetes mellitus.  Elevated CBG, improved as compared to before. -Continue frequent CBG check and insulin according to sliding scale.  AKI with CKD  stage IIIb.  Creatinine continue to improve now. -Continue to monitor  renal function. -Avoid nephrotoxins.  E. coli UTI.  Completed the course of antibiotics.  Acute urinary retention.  Patient developed acute urinary retention with abdominal pain yesterday requiring Foley catheter. Foley was discontinued yesterday and patient is able to void.  Nursing concern of aspiration.  Most likely secondary to encephalopathy.  CT head was negative for any acute changes.  NG tube was inserted yesterday and she was started on tube feed. -Repeat swallow evaluation-high risk for aspiration due to decreased awareness during oral phase of bolus management.  Recommending nectar thick liquids with 100% supervision if starting p.o. intake. -Keep her n.p.o. till little more alert. -Patient will be high risk for refeeding syndrome-monitor electrolyte along with magnesium and phosphorus daily-currently stable.  Schizophrenia.  Patient lives in a group home.  Lithium within therapeutic range on admission.  Lithium and trazodone was discontinued due to worsening hypernatremia and mental status. -Continue rest of psych meds.  Neuropathy.  Her home dose of gabapentin is being held due to increased lethargy and altered mental status.  Hypothyroidism.  Due to TSH in hyperthyroid range her home dose of Synthroid was decreased during current hospitalization. -Continue Synthroid-patient will need a repeat TSH in 4 to 6 weeks.  Physical deconditioning/generalized weakness.  PT was ordered but she is unable to participate due to her mental status.  Objective: Vitals:   12/02/19 1944 12/03/19 0000 12/03/19 0351 12/03/19 0500  BP: 105/81 113/64 109/64   Pulse: 98 96 95   Resp: 18 17 18    Temp: 98.2 F (36.8 C) 97.7 F (36.5 C) 98 F (36.7 C)   TempSrc: Oral  Oral   SpO2: 97% 90% 97%   Weight:    81.4 kg  Height:        Intake/Output Summary (Last 24 hours) at 12/03/2019 0812 Last data filed at 12/02/2019 1800 Gross per 24 hour  Intake 440 ml  Output 2750 ml  Net -2310 ml    Filed Weights   11/30/19 0410 12/02/19 0500 12/03/19 0500  Weight: 81.2 kg 81.4 kg 81.4 kg    Examination:  General.  Chronically ill-appearing, in no acute distress. Pulmonary.  Lungs clear bilaterally, normal respiratory effort. CV.  Regular rate and rhythm, no JVD, rub or murmur. Abdomen.  Soft, nontender, nondistended, BS positive. CNS.  Alert and oriented x3.  No focal neurologic deficit. Extremities.  No edema, no cyanosis, pulses intact and symmetrical. Psychiatry.  Judgment and insight appears normal.  DVT prophylaxis: Lovenox Code Status: Full Family Communication: Mother was updated on phone. Disposition Plan:  Status is: Inpatient  Remains inpatient appropriate because:Inpatient level of care appropriate due to severity of illness   Dispo: The patient is from: Group home              Anticipated d/c is to: To be determined              Anticipated d/c date is: 2-3 days.              Patient currently is not medically stable to d/c.  Patient started showing some response but remained high risk for deterioration and death.  Mother would like to keep her full code at this time and okay to proceed with NG tube feeding.  Palliative care is involved.   Consultants:   Palliative  Nephrology  Procedures:   Antimicrobials:   Cefepime  Data Reviewed: I have personally reviewed following labs and  imaging studies  CBC: Recent Labs  Lab 11/27/19 0532 11/27/19 0532 11/29/19 0515 11/30/19 0520 12/01/19 0545 12/02/19 0508 12/03/19 0520  WBC 21.0*   < > 26.2* 22.9* 20.6* 20.0* 23.0*  NEUTROABS 17.8*  --   --   --   --   --   --   HGB 16.1*   < > 14.9 13.0 12.6 13.0 14.0  HCT 53.0*   < > 51.3* 44.7 41.1 42.7 45.7  MCV 87.7   < > 90.3 89.0 87.4 86.3 85.7  PLT 539*   < > 477* 381 311 339 396   < > = values in this interval not displayed.   Basic Metabolic Panel: Recent Labs  Lab 11/29/19 0515 11/29/19 1716 11/30/19 0520 11/30/19 1819 12/01/19 0545  12/01/19 1255 12/01/19 1731 12/01/19 2312 12/02/19 0508 12/02/19 1103 12/03/19 0520  NA 162*   < > 149*   < > 145   < > 142 146* 147* 146* 148*  K 4.3   < > 3.9   < > 4.3   < > 5.3* 4.2 3.6 4.0 3.5  CL 128*   < > 121*   < > 116*   < > 114* 116* 115* 116* 114*  CO2 23   < > 20*   < > 22   < > 17* 23 25 22 25   GLUCOSE 224*   < > 345*   < > 344*   < > 362* 236* 270* 249* 138*  BUN 85*   < > 70*   < > 56*   < > 54* 54* 54* 53* 45*  CREATININE 2.50*   < > 1.67*   < > 1.26*   < > 1.32* 1.31* 1.13* 1.04* 1.14*  CALCIUM 11.7*   < > 11.0*   < > 10.9*   < > 10.8* 11.1* 10.8* 10.4* 11.0*  MG 2.8*  --  2.6*  --  2.3  --   --   --  2.6*  --  2.7*  PHOS 4.5  --  3.0  --  3.1  --   --   --  2.7  --  2.4*   < > = values in this interval not displayed.   GFR: Estimated Creatinine Clearance: 53.7 mL/min (A) (by C-G formula based on SCr of 1.14 mg/dL (H)). Liver Function Tests: Recent Labs  Lab 11/29/19 0515 11/30/19 0520 12/01/19 0545 12/02/19 0508 12/03/19 0520  AST 10* 11* 12* 13* 16  ALT 6 7 7 9 13   ALKPHOS 70 62 59 59 65  BILITOT 1.0 0.7 0.8 0.8 0.8  PROT 6.5 5.8* 5.6* 5.6* 6.3*  ALBUMIN 2.7* 2.5* 2.5* 2.5* 2.8*   No results for input(s): LIPASE, AMYLASE in the last 168 hours. No results for input(s): AMMONIA in the last 168 hours. Coagulation Profile: No results for input(s): INR, PROTIME in the last 168 hours. Cardiac Enzymes: No results for input(s): CKTOTAL, CKMB, CKMBINDEX, TROPONINI in the last 168 hours. BNP (last 3 results) No results for input(s): PROBNP in the last 8760 hours. HbA1C: No results for input(s): HGBA1C in the last 72 hours. CBG: Recent Labs  Lab 12/02/19 1113 12/02/19 1636 12/02/19 1955 12/03/19 0002 12/03/19 0419  GLUCAP 231* 296* 313* 310* 171*   Lipid Profile: No results for input(s): CHOL, HDL, LDLCALC, TRIG, CHOLHDL, LDLDIRECT in the last 72 hours. Thyroid Function Tests: No results for input(s): TSH, T4TOTAL, FREET4, T3FREE, THYROIDAB in the  last 72 hours. Anemia Panel: No results  for input(s): VITAMINB12, FOLATE, FERRITIN, TIBC, IRON, RETICCTPCT in the last 72 hours. Sepsis Labs: Recent Labs  Lab 11/27/19 0532 11/27/19 1657 11/27/19 1935 11/28/19 0644 11/29/19 0515  PROCALCITON 1.34  --   --  1.09 0.86  LATICACIDVEN  --  1.6 1.3  --   --     Recent Results (from the past 240 hour(s))  MRSA PCR Screening     Status: None   Collection Time: 11/23/19 11:26 PM   Specimen: Nasal Mucosa; Nasopharyngeal  Result Value Ref Range Status   MRSA by PCR NEGATIVE NEGATIVE Final    Comment:        The GeneXpert MRSA Assay (FDA approved for NASAL specimens only), is one component of a comprehensive MRSA colonization surveillance program. It is not intended to diagnose MRSA infection nor to guide or monitor treatment for MRSA infections. Performed at [facility], [address] [place], [place] [address]   CULTURE, BLOOD (ROUTINE X 2) w Reflex to ID Panel     Status: None   Collection Time: 11/27/19  4:57 PM   Specimen: BLOOD  Result Value Ref Range Status   Specimen Description BLOOD RAC  Final   Special Requests   Final    BOTTLES DRAWN AEROBIC AND ANAEROBIC Blood Culture adequate volume   Culture   Final    NO GROWTH 5 DAYS Performed at [facility], [address] [place], [place] [address]    Report Status 12/02/2019 FINAL  Final  CULTURE, BLOOD (ROUTINE X 2) w Reflex to ID Panel     Status: None   Collection Time: 11/27/19  5:01 PM   Specimen: BLOOD  Result Value Ref Range Status   Specimen Description BLOOD [facility]  Final   Special Requests   Final    BOTTLES DRAWN AEROBIC AND ANAEROBIC Blood Culture adequate volume   Culture   Final    NO GROWTH 5 DAYS Performed at [facility], [address] [place], [place] [address]    Report Status 12/02/2019 FINAL  Final  MRSA PCR Screening     Status: None   Collection Time: 11/29/19 10:28 AM   Specimen: Nasopharyngeal  Result Value  Ref Range Status   MRSA by PCR NEGATIVE NEGATIVE Final    Comment:        The GeneXpert MRSA Assay (FDA approved for NASAL specimens only), is one component of a comprehensive MRSA colonization surveillance program. It is not intended to diagnose MRSA infection nor to guide or monitor treatment for MRSA infections. Performed at [facility], [address], [place], [place] [address]      Radiology Studies: No results found.  Scheduled Meds: . albuterol  2 puff Inhalation Q6H  . ARIPiprazole  30 mg Per Tube QHS  . vitamin C  500 mg Per Tube Daily  . aspirin  81 mg Per Tube Daily  . benztropine  0.5 mg Per Tube BID  . cholecalciferol  1,000 Units Per Tube Daily  . diltiazem  30 mg Per Tube Q6H  . enoxaparin (LOVENOX) injection  40 mg Subcutaneous Q24H  . feeding supplement (PROSource TF)  45 mL Per Tube BID  . fluticasone  2 spray Each Nare Daily  . free water  200 mL Per Tube Q2H  . insulin aspart  0-20 Units Subcutaneous Q4H  . insulin detemir  8 Units Subcutaneous BID  . levothyroxine  25 mcg Per Tube QAC breakfast  . liver oil-zinc oxide  Topical TID  . mouth rinse  15 mL Mouth Rinse BID  . methylPREDNISolone (SOLU-MEDROL) injection  30 mg Intravenous Daily  . pantoprazole (PROTONIX) IV  40 mg Intravenous QHS  . polyethylene glycol  17 g Oral Daily  . senna  1 tablet Per Tube QHS  . sertraline  100 mg Per Tube q AM  . zinc sulfate  220 mg Oral Daily   Continuous Infusions: . ceFEPime (MAXIPIME) IV Stopped (12/02/19 2230)  . feeding supplement (JEVITY 1.5 CAL/FIBER) 1,000 mL (12/02/19 2115)     LOS: 11 days   Time spent: 30 minutes.  Lorella Nimrod, MD [facility]  If 7PM-7AM, please contact night-coverage [email or web address]  12/03/2019, 8:12 AM   This record has been created using Systems analyst. Errors have been sought and corrected,but may not always be located. Such creation errors do not reflect on the standard of care.

## 2019-12-04 LAB — GLUCOSE, CAPILLARY
Glucose-Capillary: 68 mg/dL — ABNORMAL LOW (ref 70–99)
Glucose-Capillary: 124 mg/dL — ABNORMAL HIGH (ref 70–99)
Glucose-Capillary: 173 mg/dL — ABNORMAL HIGH (ref 70–99)
Glucose-Capillary: 191 mg/dL — ABNORMAL HIGH (ref 70–99)
Glucose-Capillary: 230 mg/dL — ABNORMAL HIGH (ref 70–99)
Glucose-Capillary: 274 mg/dL — ABNORMAL HIGH (ref 70–99)

## 2019-12-04 LAB — COMPREHENSIVE METABOLIC PANEL
ALT: 14 U/L (ref 0–44)
AST: 16 U/L (ref 15–41)
Albumin: 2.4 g/dL — ABNORMAL LOW (ref 3.5–5.0)
Alkaline Phosphatase: 58 U/L (ref 38–126)
Anion gap: 7 (ref 5–15)
BUN: 41 mg/dL — ABNORMAL HIGH (ref 6–20)
CO2: 22 mmol/L (ref 22–32)
Calcium: 9.8 mg/dL (ref 8.9–10.3)
Chloride: 109 mmol/L (ref 98–111)
Creatinine, Ser: 0.9 mg/dL (ref 0.44–1.00)
GFR calc Af Amer: >60 mL/min (ref >60)
GFR calc non Af Amer: >60 mL/min (ref >60)
Glucose, Bld: 225 mg/dL — ABNORMAL HIGH (ref 70–99)
Potassium: 4.3 mmol/L (ref 3.5–5.1)
Sodium: 138 mmol/L (ref 135–145)
Total Bilirubin: 0.5 mg/dL (ref 0.3–1.2)
Total Protein: 5.4 g/dL — ABNORMAL LOW (ref 6.5–8.1)

## 2019-12-04 MED ORDER — INSULIN ASPART 100 UNIT/ML ~~LOC~~ SOLN
0.0000 [IU] | Freq: Three times a day (TID) | SUBCUTANEOUS | Status: DC
  Administered 2019-12-04: 16:00:00 7 [IU] via SUBCUTANEOUS
  Administered 2019-12-05: 09:00:00 11 [IU] via SUBCUTANEOUS
  Administered 2019-12-05 (×2): 7 [IU] via SUBCUTANEOUS
  Administered 2019-12-06 (×2): 20 [IU] via SUBCUTANEOUS
  Administered 2019-12-07: 15 [IU] via SUBCUTANEOUS
  Filled 2019-12-04 (×6): qty 1

## 2019-12-04 MED ORDER — TRAZODONE HCL 50 MG PO TABS
50.0000 mg | ORAL_TABLET | Freq: Every evening | ORAL | Status: DC | PRN
  Administered 2019-12-04 – 2019-12-05 (×2): 50 mg via ORAL
  Filled 2019-12-04 (×2): qty 1

## 2019-12-04 MED ORDER — INSULIN ASPART 100 UNIT/ML ~~LOC~~ SOLN: 0.0000 [IU] | Freq: Every day | SUBCUTANEOUS | Status: DC

## 2019-12-04 MED ORDER — FREE WATER
250.0000 mL | Status: DC
  Administered 2019-12-04 – 2019-12-05 (×4): 250 mL

## 2019-12-04 NOTE — Progress Notes (Signed)
Patient ID: Elizabeth Gutierrez, female   DOB: [birth year], 59 y.o.   MRN: [id number]   Came by but patient not making enough sense, hard to hear in room and get adequate info  Will wait until more clear and also need collateral  Thanks   Rama Anne Fu MD

## 2019-12-04 NOTE — Evaluation (Signed)
Occupational Therapy Evaluation Patient Details Name: Elizabeth Gutierrez MRN: [id number] DOB: 24-Mar-1960 Today's Date: 12/04/2019    History of Present Illness Per MD note: The patient is a 59 yr old cognitively impaired woman who lives in a group home. She was diagnosed with COVID 19 on 11/10/2019. Past medical history significant for CHF (EF 30-35% from echo 09/27/2019), CAD (Hx MI), DM II, Hypertension, Hyperlipidemia, Paranoid Schizophrenia, Thyroid disease.Admitted for COVID-19 pneumonia, UTI with altered mental status.  Requiring up to 9 L with HFNC, uses 2 L at home. Completed the treatment with remdesivir and UTI.Developed hypernatremia and hypercalcemia, started improving.   Clinical Impression   Pt was seen for OT evaluation this date. Prior to hospital admission, pt was living in group home. Her PLOF with fxl mobility and ADLs is unclear, but it appears from previous therapy documentation that pt has 24/7 assist available. Currently pt demonstrates impairments as described below (See OT problem list) which functionally limit her ability to perform ADL/self-care tasks. Pt currently requires MOD A for bed mobility and MIN/MOD A With arm in arm from elevated surface to complete sit<>Stand.  In addition, pt requires MOD A with LB dressing and demos POOR sitting and standing balance. Pt would benefit from skilled OT services to address noted impairments and functional limitations (see below for any additional details) in order to maximize safety and independence while minimizing falls risk and caregiver burden. Upon hospital discharge, recommend STR to maximize pt safety and return to PLOF.     Follow Up Recommendations  SNF    Equipment Recommendations  Other (comment) (defer to next level of care)    Recommendations for Other Services       Precautions / Restrictions Precautions Precautions: Fall Restrictions Weight Bearing Restrictions: No Other Position/Activity Restrictions: monitor O2       Mobility Bed Mobility Overal bed mobility: Needs Assistance Bed Mobility: Supine to Sit;Sit to Supine     Supine to sit: Min assist;HOB elevated Sit to supine: Mod assist;Max assist;HOB elevated      Transfers Overall transfer level: Needs assistance Equipment used: Rolling walker (2 wheeled);1 person hand held assist Transfers: Sit to/from Stand Sit to Stand: Min assist;Mod assist;From elevated surface         General transfer comment: initially not successful with sit to stand with RW as pt continually tries to pull up from it despite tactile/verbal cues for safe hand placement-pt with difficulty sequencing safely. Pt able to successfully come to stand with MIN/MOD A arm in arm technique from elevated surface as this was easier to sequence for pt.    Balance Overall balance assessment: Needs assistance Sitting-balance support: Feet supported;Single extremity supported Sitting balance-Leahy Scale: Poor Sitting balance - Comments: intermittently bobbles/sways in sitting. Shes noted to have head FWD/down with moderate cues required to extend cervical spine/hold head up. Requires MIN/MOD A for most of time in sitting, but intermittently able to engage bettern and complete short bouts ~20-20 secs with close SBA.     Standing balance-Leahy Scale: Poor Standing balance comment: B UE support and MOD A to sustain static stand.                           ADL either performed or assessed with clinical judgement   ADL Overall ADL's : Needs assistance/impaired Eating/Feeding: Set up;Sitting   Grooming: Supervision/safety;Set up;Sitting           Upper Body Dressing : Minimal assistance;Moderate assistance;Sitting Upper  Body Dressing Details (indicate cue type and reason): to don clean gown Lower Body Dressing: Moderate assistance;Sitting/lateral leans Lower Body Dressing Details (indicate cue type and reason): to don socks at EOB, able to iniitate cross-leg  tehcnique, but requires assist to maintain that position while attempting to advance garment.                     Vision   Additional Comments: difficult to formally assess d/t cognition, but pt does appear to track appropriate and reports use of glasses at baseline.     Perception     Praxis      Pertinent Vitals/Pain Pain Assessment: Faces Faces Pain Scale: No hurt     Hand Dominance Right   Extremity/Trunk Assessment Upper Extremity Assessment Upper Extremity Assessment: Generalized weakness (ROM WFL, MMT grossly 4-/5)   Lower Extremity Assessment Lower Extremity Assessment: Defer to PT evaluation;Generalized weakness   Cervical / Trunk Assessment Cervical / Trunk Assessment: Other exceptions Cervical / Trunk Exceptions: head forward/down   Communication Communication Communication: Expressive difficulties (somewhat garbled/difficult to understand)   Cognition Arousal/Alertness: Awake/alert Behavior During Therapy: Flat affect;Impulsive Overall Cognitive Status: No family/caregiver present to determine baseline cognitive functioning                                 General Comments: unsure of cognitive baseline. Pt is able to state name and location, but not oriented to time or situation. Pt able to follow simple one step commands appropriately with increased processing time intermittently.   General Comments       Exercises Other Exercises Other Exercises: OT faciltiates safety education with pt-use of call light, attention to lines/leads. Pt with MIN/MOD carryover/understanding.   Shoulder Instructions      Home Living Family/patient expects to be discharged to:: Group home                                 Additional Comments: unsure of setup/PLOF. Previous therapy notes indicate level entry and 24/7 assistance      Prior Functioning/Environment Level of Independence: Needs assistance  Gait / Transfers Assistance Needed:  Pt reports using a walker, but then indicates that she walks "by herself". Unsure of efficacy ADL's / Homemaking Assistance Needed: Per previous therapy assessment; Pt requires assistance to bathe and dress.            OT Problem List: Decreased strength;Decreased activity tolerance;Impaired balance (sitting and/or standing);Decreased cognition;Decreased safety awareness;Decreased knowledge of use of DME or AE;Cardiopulmonary status limiting activity      OT Treatment/Interventions: Self-care/ADL training;DME and/or AE instruction;Therapeutic activities;Balance training;Therapeutic exercise;Energy conservation;Patient/family education    OT Goals(Current goals can be found in the care plan section) Acute Rehab OT Goals Patient Stated Goal: none stated OT Goal Formulation: Patient unable to participate in goal setting Time For Goal Achievement: 12/18/19 Potential to Achieve Goals: Fair ADL Goals Pt Will Perform Grooming: with set-up;sitting Pt Will Perform Upper Body Dressing: with set-up;sitting Pt Will Perform Lower Body Dressing: with min guard assist;sit to/from stand Pt Will Transfer to Toilet: with min guard assist;ambulating;bedside commode Pt Will Perform Toileting - Clothing Manipulation and hygiene: with min guard assist;sit to/from stand  OT Frequency: Min 1X/week   Barriers to D/C:            Co-evaluation  AM-PAC OT "6 Clicks" Daily Activity     Outcome Measure Help from another person eating meals?: A Little Help from another person taking care of personal grooming?: A Little Help from another person toileting, which includes using toliet, bedpan, or urinal?: A Lot Help from another person bathing (including washing, rinsing, drying)?: A Lot Help from another person to put on and taking off regular upper body clothing?: A Lot Help from another person to put on and taking off regular lower body clothing?: A Lot 6 Click Score: 14   End of Session  Equipment Utilized During Treatment: Gait belt;Rolling walker Nurse Communication: Mobility status;Other (comment) (notified CNA purewick needs replaced)  Activity Tolerance: Patient tolerated treatment well Patient left: in bed;with call bell/phone within reach;with bed alarm set  OT Visit Diagnosis: Unsteadiness on feet (R26.81);Muscle weakness (generalized) (M62.81)                Time: 9450-3888 OT Time Calculation (min): 42 min Charges:  OT General Charges $OT Visit: 1 Visit OT Evaluation $OT Eval Moderate Complexity: 1 Mod OT Treatments $Self Care/Home Management : 8-22 mins $Therapeutic Activity: 8-22 mins  Gerrianne Scale, MS, OTR/L ascom [phone] 12/04/19, 5:10 PM

## 2019-12-04 NOTE — Progress Notes (Signed)
PROGRESS NOTE    Elizabeth Gutierrez  ZOX:[id number] DOB: [birth year] DOA: 11/22/2019 PCP: Denton Lank, MD   Brief Narrative: Taken from H&P. The patient is a 59 yr old cognitively impaired woman who lives in a group home. She was diagnosed with COVID 19 on 11/10/2019.  past medical history significant for CHF (EF 30-35% from echo 09/27/2019), CAD (Hx MI), DM II, Hypertension, Hyperlipidemia, Paranoid Schizophrenia, Thyroid disease. Admitted for COVID-19 pneumonia, UTI with altered mental status.  Requiring up to 9 L with HFNC, uses 2 L at home. Completed the treatment with remdesivir and UTI. Developed hypernatremia and hypercalcemia, started improving.  Subjective: Patient appears little lethargic but able to follow command. She even sit up herself for chest auscultation.  Assessment & Plan:   Principal Problem:   COVID-19 Active Problems:   Chronic systolic heart failure (HCC)   CAD in native artery   Controlled type 2 diabetes mellitus without complication (HCC)   Benign essential HTN   Altered mental state   Paranoid schizophrenia ([place])   Type 2 diabetes mellitus with hyperlipidemia (HCC)   Hypothyroidism   Acute respiratory failure with hypoxia (HCC)   Acute kidney injury superimposed on CKD (HCC)   Weakness   Hypercalcemia   Tachycardia   Goals of care, counseling/discussion   Palliative care by specialist   DNR (do not resuscitate) discussion   Malnutrition of moderate degree  Acute hypoxic respiratory failure secondary to COVID-19 pneumonia.   Patient is at baseline for her oxygen requirement now.  Saturating well on 2 L.  Completed the treatment for remdesivir.  Did receive baricitinib which was discontinued due to worsening leukocytosis.  Completed a course of Zithromax and cefepime.  Elevated inflammatory markers which are now improving.  Elevated procalcitonin at 1.34.  MRSA nasal swab was negative. -Continue steroid taper. -Continue supportive care. -Continue  supplements. -Palliative was consulted due to worsening encephalopathy-mother needs some time before making some decisions regarding CODE STATUS and further management.  Patient will remain full code till that time.  Acute metabolic encephalopathy.  Some improvement in mentation . Able to follow command.  May be at baseline.  Found to have hypercalcimia and worsening hypernatremia which can contribute to encephalopathy.  Patient also has underlying psych illness and on multiple psych meds.  -Blood cultures remain negative with normal lactic acid. -Keep holding trazodone, lithium and Neurontin. -Repeat swallow evaluation. -PT evaluation.  Hyperparathyroidism/ hypercalcemia.  Some improvement in calcium level, corrected calcium of  11.1 today. hypercalcemia labs with mildly decreased vitamin D and elevated parathyroid.  Protein electrophoresis within normal limits. Patient also has CKD stage IIIb.-Complete the course of Miacalcin. PTH related peptide pending. -Consult nephrology.-Appreciate their help. -Continue p.o. hydration.  Hypernatremia.  Resolved.  Sodium of 138 today  Lithium was also discontinued as that can also contribute to hypernatremia.  -Decreased free water frequency to 300 mL every 4 hour .  HFrEF.  Patient has EF of 30 to 35% according to echo done in July 2021. -We will observe volume status as she needs IV fluid for worsening hypernatremia and hypercalcemia.  Diabetes mellitus.  Elevated CBG, improved as compared to before. -Continue frequent CBG check and insulin according to sliding scale.  AKI with CKD stage II.  Resolved.  I am not sure about CKD stage III BS patient's creatinine has been completely normalized.  May be stage 2. -Continue to monitor renal function. -Avoid nephrotoxins.  E. coli UTI.  Completed the course of antibiotics.  Acute urinary retention.  Patient developed acute urinary retention with abdominal pain yesterday requiring Foley  catheter. Foley was discontinued  and patient is able to void.  Nursing concern of aspiration.  Most likely secondary to encephalopathy.  CT head was negative for any acute changes.  NG tube was inserted yesterday and she was started on tube feed. -Repeat swallow evaluation-high risk for aspiration due to decreased awareness during oral phase of bolus management.  Recommending nectar thick liquids with 100% supervision if starting p.o. intake. -Try giving her dysphagia diet according to swallow team recommendations with nectar thick liquids and if able to take it then we can DC NG tube. -Patient will be high risk for refeeding syndrome-monitor electrolyte along with magnesium and phosphorus daily-currently stable.  Schizophrenia.  Patient lives in a group home.  Lithium within therapeutic range on admission.  Lithium and trazodone was discontinued due to worsening hypernatremia and mental status. -Continue rest of psych meds. -We will ask psychiatry to help with her medications as we have discontinued some of them.  Neuropathy.  Her home dose of gabapentin is being held due to increased lethargy and altered mental status.  Can be restarted if patient started complaining about pain.  Hypothyroidism.  Due to TSH in hyperthyroid range her home dose of Synthroid was decreased during current hospitalization. -Continue Synthroid-patient will need a repeat TSH in 4 to 6 weeks.  Physical deconditioning/generalized weakness.  PT was ordered but she is unable to participate due to her mental status. -Reordered PT and OT evaluation.  Objective: Vitals:   12/03/19 2329 12/04/19 0400 12/04/19 0424 12/04/19 0500  BP:  117/73    Pulse: 88  82   Resp: 20  20   Temp: 98.1 F (36.7 C)  98.2 F (36.8 C)   TempSrc:   Oral   SpO2: 95%  98%   Weight:    82.3 kg  Height:        Intake/Output Summary (Last 24 hours) at 12/04/2019 0809 Last data filed at 12/03/2019 2311 Gross per 24 hour  Intake 0 ml   Output 900 ml  Net -900 ml   Filed Weights   12/02/19 0500 12/03/19 0500 12/04/19 0500  Weight: 81.4 kg 81.4 kg 82.3 kg    Examination:  General.  Cognitively impaired lady, in no acute distress. Pulmonary.  Lungs clear bilaterally, normal respiratory effort. CV.  Regular rate and rhythm, no JVD, rub or murmur. Abdomen.  Soft, nontender, nondistended, BS positive. CNS.  Alert and oriented x1.  No focal neurologic deficit. Extremities.  No edema, no cyanosis, pulses intact and symmetrical. Psychiatry.  Judgment and insight appears normal.  DVT prophylaxis: Lovenox Code Status: Full Family Communication: Mother was updated on phone. Disposition Plan:  Status is: Inpatient  Remains inpatient appropriate because:Inpatient level of care appropriate due to severity of illness   Dispo: The patient is from: Group home              Anticipated d/c is to: To be determined              Anticipated d/c date is: 2-3 days.              Patient currently is not medically stable to d/c.  Patient started showing improvement in her mental status.  Seems closer to her baseline now.  We will see if she is able to take p.o. so we can discontinue NG tube in preparation of discharge in the next 2 to 3 days.  Consultants:  Palliative  Nephrology  Procedures:   Antimicrobials:   Cefepime  Data Reviewed: I have personally reviewed following labs and imaging studies  CBC: Recent Labs  Lab 11/29/19 0515 11/30/19 0520 12/01/19 0545 12/02/19 0508 12/03/19 0520  WBC 26.2* 22.9* 20.6* 20.0* 23.0*  HGB 14.9 13.0 12.6 13.0 14.0  HCT 51.3* 44.7 41.1 42.7 45.7  MCV 90.3 89.0 87.4 86.3 85.7  PLT 477* 381 311 339 300   Basic Metabolic Panel: Recent Labs  Lab 11/29/19 0515 11/29/19 1716 11/30/19 0520 11/30/19 1819 12/01/19 0545 12/01/19 1255 12/01/19 1731 12/01/19 2312 12/02/19 0508 12/02/19 1103 12/03/19 0520  NA 162*   < > 149*   < > 145   < > 142 146* 147* 146* 148*  K 4.3    < > 3.9   < > 4.3   < > 5.3* 4.2 3.6 4.0 3.5  CL 128*   < > 121*   < > 116*   < > 114* 116* 115* 116* 114*  CO2 23   < > 20*   < > 22   < > 17* 23 25 22 25   GLUCOSE 224*   < > 345*   < > 344*   < > 362* 236* 270* 249* 138*  BUN 85*   < > 70*   < > 56*   < > 54* 54* 54* 53* 45*  CREATININE 2.50*   < > 1.67*   < > 1.26*   < > 1.32* 1.31* 1.13* 1.04* 1.14*  CALCIUM 11.7*   < > 11.0*   < > 10.9*   < > 10.8* 11.1* 10.8* 10.4* 11.0*  MG 2.8*  --  2.6*  --  2.3  --   --   --  2.6*  --  2.7*  PHOS 4.5  --  3.0  --  3.1  --   --   --  2.7  --  2.4*   < > = values in this interval not displayed.   GFR: Estimated Creatinine Clearance: 54 mL/min (A) (by C-G formula based on SCr of 1.14 mg/dL (H)). Liver Function Tests: Recent Labs  Lab 11/29/19 0515 11/30/19 0520 12/01/19 0545 12/02/19 0508 12/03/19 0520  AST 10* 11* 12* 13* 16  ALT 6 7 7 9 13   ALKPHOS 70 62 59 59 65  BILITOT 1.0 0.7 0.8 0.8 0.8  PROT 6.5 5.8* 5.6* 5.6* 6.3*  ALBUMIN 2.7* 2.5* 2.5* 2.5* 2.8*   No results for input(s): LIPASE, AMYLASE in the last 168 hours. No results for input(s): AMMONIA in the last 168 hours. Coagulation Profile: No results for input(s): INR, PROTIME in the last 168 hours. Cardiac Enzymes: No results for input(s): CKTOTAL, CKMB, CKMBINDEX, TROPONINI in the last 168 hours. BNP (last 3 results) No results for input(s): PROBNP in the last 8760 hours. HbA1C: No results for input(s): HGBA1C in the last 72 hours. CBG: Recent Labs  Lab 12/03/19 1625 12/03/19 1943 12/03/19 2331 12/04/19 0426 12/04/19 0619  GLUCAP 286* 243* 204* 68* 124*   Lipid Profile: No results for input(s): CHOL, HDL, LDLCALC, TRIG, CHOLHDL, LDLDIRECT in the last 72 hours. Thyroid Function Tests: No results for input(s): TSH, T4TOTAL, FREET4, T3FREE, THYROIDAB in the last 72 hours. Anemia Panel: No results for input(s): VITAMINB12, FOLATE, FERRITIN, TIBC, IRON, RETICCTPCT in the last 72 hours. Sepsis Labs: Recent Labs  Lab  11/27/19 1657 11/27/19 1935 11/28/19 0644 11/29/19 0515  PROCALCITON  --   --  1.09 0.86  LATICACIDVEN 1.6  1.3  --   --     Recent Results (from the past 240 hour(s))  CULTURE, BLOOD (ROUTINE X 2) w Reflex to ID Panel     Status: None   Collection Time: 11/27/19  4:57 PM   Specimen: BLOOD  Result Value Ref Range Status   Specimen Description BLOOD RAC  Final   Special Requests   Final    BOTTLES DRAWN AEROBIC AND ANAEROBIC Blood Culture adequate volume   Culture   Final    NO GROWTH 5 DAYS Performed at [facility], [address] [place], [place] [address]    Report Status 12/02/2019 FINAL  Final  CULTURE, BLOOD (ROUTINE X 2) w Reflex to ID Panel     Status: None   Collection Time: 11/27/19  5:01 PM   Specimen: BLOOD  Result Value Ref Range Status   Specimen Description BLOOD [facility]  Final   Special Requests   Final    BOTTLES DRAWN AEROBIC AND ANAEROBIC Blood Culture adequate volume   Culture   Final    NO GROWTH 5 DAYS Performed at [facility], [address] [place], [place] [address]    Report Status 12/02/2019 FINAL  Final  MRSA PCR Screening     Status: None   Collection Time: 11/29/19 10:28 AM   Specimen: Nasopharyngeal  Result Value Ref Range Status   MRSA by PCR NEGATIVE NEGATIVE Final    Comment:        The GeneXpert MRSA Assay (FDA approved for NASAL specimens only), is one component of a comprehensive MRSA colonization surveillance program. It is not intended to diagnose MRSA infection nor to guide or monitor treatment for MRSA infections. Performed at [facility], [address] [place], [place] [address]      Radiology Studies: No results found.  Scheduled Meds: . albuterol  2 puff Inhalation Q6H  . ARIPiprazole  30 mg Per Tube QHS  . vitamin C  500 mg Per Tube Daily  . aspirin  81 mg Per Tube Daily  . benztropine  0.5 mg Per Tube BID  . carvedilol  3.125 mg Per Tube BID WC  . cholecalciferol  1,000 Units  Per Tube Daily  . enoxaparin (LOVENOX) injection  40 mg Subcutaneous Q24H  . feeding supplement (PROSource TF)  45 mL Per Tube BID  . fluticasone  2 spray Each Nare Daily  . free water  250 mL Per Tube Q2H  . insulin aspart  0-20 Units Subcutaneous Q4H  . insulin detemir  8 Units Subcutaneous BID  . levothyroxine  25 mcg Per Tube QAC breakfast  . liver oil-zinc oxide   Topical TID  . mouth rinse  15 mL Mouth Rinse BID  . methylPREDNISolone (SOLU-MEDROL) injection  30 mg Intravenous Daily  . pantoprazole (PROTONIX) IV  40 mg Intravenous QHS  . polyethylene glycol  17 g Per Tube Daily  . senna  1 tablet Per Tube QHS  . sertraline  100 mg Per Tube q AM  . zinc sulfate  220 mg Per Tube Daily   Continuous Infusions: . feeding supplement (JEVITY 1.5 CAL/FIBER) 1,000 mL (12/02/19 2115)     LOS: 12 days   Time spent: 30 minutes.  Lorella Nimrod, MD [facility]  If 7PM-7AM, please contact night-coverage [email or web address]  12/04/2019, 8:09 AM   This record has been created using Systems analyst. Errors have been sought and corrected,but may not always be located. Such creation errors do not  reflect on the standard of care.

## 2019-12-04 NOTE — Progress Notes (Signed)
PT Cancellation Note  Patient Details Name: Elizabeth Gutierrez MRN: [id number] DOB: 05-09-1960   Cancelled Treatment:    Reason Eval/Treat Not Completed: Fatigue/lethargy limiting ability to participate.  Pt unable to stay awake for initial evaluation for PT.  Pt had just been seen by OT prior to therapist coming into room.  Therapy will re-attempt evaluation at later date when pt is not fatigued.   Gwenlyn Saran, PT, DPT 12/04/19, 3:27 PM

## 2019-12-05 ENCOUNTER — Inpatient Hospital Stay: Payer: Medicare Other

## 2019-12-05 LAB — CBC
HCT: 40.8 % (ref 36.0–46.0)
Hemoglobin: 12.1 g/dL (ref 12.0–15.0)
MCH: 25.9 pg — ABNORMAL LOW (ref 26.0–34.0)
MCHC: 29.7 g/dL — ABNORMAL LOW (ref 30.0–36.0)
MCV: 87.4 fL (ref 80.0–100.0)
Platelets: 346 10*3/uL (ref 150–400)
RBC: 4.67 MIL/uL (ref 3.87–5.11)
RDW: 15.2 % (ref 11.5–15.5)
WBC: 20 10*3/uL — ABNORMAL HIGH (ref 4.0–10.5)
nRBC: 0 % (ref 0.0–0.2)

## 2019-12-05 LAB — COMPREHENSIVE METABOLIC PANEL
ALT: 13 U/L (ref 0–44)
AST: 13 U/L — ABNORMAL LOW (ref 15–41)
Albumin: 2.5 g/dL — ABNORMAL LOW (ref 3.5–5.0)
Alkaline Phosphatase: 61 U/L (ref 38–126)
Anion gap: 7 (ref 5–15)
BUN: 36 mg/dL — ABNORMAL HIGH (ref 6–20)
CO2: 27 mmol/L (ref 22–32)
Calcium: 9.7 mg/dL (ref 8.9–10.3)
Chloride: 104 mmol/L (ref 98–111)
Creatinine, Ser: 1 mg/dL (ref 0.44–1.00)
GFR calc Af Amer: >60 mL/min (ref >60)
GFR calc non Af Amer: >60 mL/min (ref >60)
Glucose, Bld: 293 mg/dL — ABNORMAL HIGH (ref 70–99)
Potassium: 4 mmol/L (ref 3.5–5.1)
Sodium: 138 mmol/L (ref 135–145)
Total Bilirubin: 0.5 mg/dL (ref 0.3–1.2)
Total Protein: 5.5 g/dL — ABNORMAL LOW (ref 6.5–8.1)

## 2019-12-05 LAB — GLUCOSE, CAPILLARY
Glucose-Capillary: 280 mg/dL — ABNORMAL HIGH (ref 70–99)
Glucose-Capillary: 274 mg/dL — ABNORMAL HIGH (ref 70–99)
Glucose-Capillary: 226 mg/dL — ABNORMAL HIGH (ref 70–99)
Glucose-Capillary: 179 mg/dL — ABNORMAL HIGH (ref 70–99)

## 2019-12-05 LAB — MAGNESIUM: Magnesium: 2.3 mg/dL (ref 1.7–2.4)

## 2019-12-05 LAB — PHOSPHORUS: Phosphorus: 3.4 mg/dL (ref 2.5–4.6)

## 2019-12-05 MED ORDER — NEPRO/CARBSTEADY PO LIQD
237.0000 mL | Freq: Three times a day (TID) | ORAL | Status: DC
  Administered 2019-12-06 – 2019-12-07 (×4): 237 mL via ORAL

## 2019-12-05 MED ORDER — GUAIFENESIN-DM 100-10 MG/5ML PO SYRP: 5.0000 mL | ORAL_SOLUTION | ORAL | Status: DC | PRN

## 2019-12-05 MED ORDER — MIRTAZAPINE 15 MG PO TABS
15.0000 mg | ORAL_TABLET | Freq: Every day | ORAL | Status: DC
  Administered 2019-12-05 – 2019-12-06 (×2): 15 mg via ORAL
  Filled 2019-12-05 (×2): qty 1

## 2019-12-05 MED ORDER — HALOPERIDOL LACTATE 5 MG/ML IJ SOLN
2.0000 mg | Freq: Three times a day (TID) | INTRAMUSCULAR | Status: DC | PRN
  Administered 2019-12-05: 2 mg via INTRAVENOUS
  Filled 2019-12-05: qty 1

## 2019-12-05 NOTE — Progress Notes (Signed)
12/05/19 1315  What Happened  Was fall witnessed? No  Was patient injured? No  Patient found on floor  Found by Staff-comment ([facility])  Stated prior activity other (comment) (to get water)  Follow Up  MD notified Elizabeth Gutierrez  Time MD notified 81  Family notified No - patient refusal  Additional tests Yes-comment (CT head)  Simple treatment Other (comment) (assess)  Progress note created (see row info) Yes  Adult Fall Risk Assessment  Risk Factor Category (scoring not indicated) Fall has occurred during this admission (document High fall risk)  Age 59  Fall History: Fall within 6 months prior to admission 0  Elimination; Bowel and/or Urine Incontinence 2  Elimination; Bowel and/or Urine Urgency/Frequency 0  Medications: includes PCA/Opiates, Anti-convulsants, Anti-hypertensives, Diuretics, Hypnotics, Laxatives, Sedatives, and Psychotropics 3  Patient Care Equipment 2  Mobility-Assistance 2  Mobility-Gait 2  Mobility-Sensory Deficit 0  Altered awareness of immediate physical environment 0  Impulsiveness 2  Lack of understanding of one's physical/cognitive limitations 4  Total Score 17  Patient Fall Risk Level High fall risk  Adult Fall Risk Interventions  Required Bundle Interventions *See Row Information* High fall risk - low, moderate, and high requirements implemented  Additional Interventions Use of appropriate toileting equipment (bedpan, BSC, etc.)  Screening for Fall Injury Risk (To be completed on HIGH fall risk patients) - Assessing Need for Low Bed  Risk For Fall Injury- Low Bed Criteria None identified - Continue screening  Will Implement Low Bed and Floor Mats No - Criteria no longer met for low bed  Screening for Fall Injury Risk (To be completed on HIGH fall risk patients who do not meet crieteria for Low Bed) - Assessing Need for Floor Mats Only  Risk For Fall Injury- Criteria for Floor Mats None identified - No additional interventions needed  Vitals  Temp 98.2  F (36.8 C)  Temp Source Oral  BP 123/89  MAP (mmHg) 91  BP Location Right Arm  BP Method Automatic  Patient Position (if appropriate) Lying  Pulse Rate 84  Pulse Rate Source Monitor  Resp 19  Oxygen Therapy  SpO2 91 %  O2 Device Nasal Cannula  O2 Flow Rate (L/min) 2 L/min  PAINAD (Pain Assessment in Advanced Dementia)  Breathing 0  Negative Vocalization 0  Facial Expression 0  Body Language 0  Consolability 0  PAINAD Score 0  Neurological  Neuro (WDL) X  Level of Consciousness Alert  Orientation Level Oriented to person  Cognition Appropriate at baseline  Speech Slurred/Dysarthria  Pupil Assessment  Yes  R Pupil Size (mm) 3  R Pupil Shape Round  R Pupil Reaction Brisk  L Pupil Size (mm) 3  L Pupil Shape Round  L Pupil Reaction Brisk  Additional Pupil Assessments No  Motor Function/Sensation Assessment Grip;Dorsiflexion;Plantar flexion  R Hand Grip Present  L Hand Grip Present  R Foot Dorsiflexion Present  L Foot Dorsiflexion Present  R Foot Plantar Flexion Present  L Foot Plantar Flexion Present  RUE Motor Response Purposeful movement  LUE Motor Response Purposeful movement  RLE Motor Response Purposeful movement  LLE Motor Response Purposeful movement  Neuro Symptoms None  Neuro symptoms relieved by Rest;Relaxation techniques (Comment)  Musculoskeletal  Musculoskeletal (WDL) X  Assistive Device None  Generalized Weakness Yes  Weight Bearing Restrictions No  Integumentary  Integumentary (WDL) X  RN Assisting with Skin Assessment on Admission Sueanne Margarita, RN  Skin Color Appropriate for ethnicity  Skin Condition Dry  Skin Integrity Ecchymosis  Ecchymosis Location Arm;Foot;Back  Ecchymosis Location Orientation Right;Left  Ecchymosis Intervention Other (Comment)  Skin Turgor Non-tenting

## 2019-12-05 NOTE — Progress Notes (Signed)
  Speech Language Pathology Treatment: Dysphagia  Patient Details Name: Elizabeth Gutierrez MRN: [id number] DOB: 12-28-1960 Today's Date: 12/05/2019 Time: 0211-1735 SLP Time Calculation (min) (ACUTE ONLY): 25 min  Assessment / Plan / Recommendation Clinical Impression  Pt was asleep but easily aroused and able to maintain alertness throughout session. Pt appears to be more verbal than previously reported and pragmatically she made great eye contact. SLP facilitated session by providing skilled observation of pt consuming trials of puree, nectar thick liquids via spoon and thin liquids via spoon. Pt's oral phase is c/b brief oral holding prior to swallow. However after swallowing each bolus, she didn't have any oral residue present after each swallow.  Pt was free of overt s/s of aspiration when consuming nectar thick liquid via spoon but exhibited a consistent immediate cough with thin liquids via spoon. At this point in pt's recovery, recommend dysphagia 1 diet with nectar thick liquids via spoon, medicine crushed in puree. Pt's diet can be upgrade and or instrumental study as an Outpatient when pt's recovery is further along and more stable. ST to sign off at this time.    HPI HPI: Pt is a 59 yr old cognitively impaired (MR) woman who lives in a group home. She was diagnosed with COVID 19 on 11/10/2019. At admit, she was found to have altered mental status. She was saturating 77% on room air.  She has a past medical history significant for CHF (EF 30-35% from echo 09/27/2019), CAD (Hx MI), DM II, Hypertension, Hyperlipidemia, Paranoid Schizophrenia, Thyroid disease.  Pt is being followed by Palliative Care services; NGT for nutrition currently.      SLP Plan  All goals met       Recommendations  Diet recommendations: Dysphagia 1 (puree);Nectar-thick liquid Liquids provided via: Teaspoon Medication Administration: Crushed with puree Supervision: Full supervision/cueing for compensatory  strategies;Staff to assist with self feeding Compensations: Minimize environmental distractions;Slow rate;Small sips/bites Postural Changes and/or Swallow Maneuvers: Seated upright 90 degrees                Oral Care Recommendations: Oral care BID;Oral care before and after PO;Staff/trained caregiver to provide oral care Follow up Recommendations: Skilled Nursing facility SLP Visit Diagnosis: Dysphagia, oropharyngeal phase (R13.12) Plan: All goals met       GO               Merie Wulf B. Rutherford Nail M.S., CCC-SLP, [place] Office [phone]  Stormy Fabian 12/05/2019, 12:41 PM

## 2019-12-05 NOTE — NC FL2 (Signed)
[place] LEVEL OF CARE SCREENING TOOL     IDENTIFICATION  Patient Name: Elizabeth Gutierrez Birthdate: [birth year] Sex: female Admission Date (Current Location): 11/22/2019  [place] and [place] Number:  Engineering geologist and Address:  [facility], [address] [place], [place] [address]      Provider Number: [id number]  Attending Physician Name and Address:  Lorella Nimrod, MD  Relative Name and Phone Number:  Lennox Leikam (mother) [phone]    Current Level of Care: Hospital Recommended Level of Care: [place] Prior Approval Number:    Date Approved/Denied:   PASRR Number:    Discharge Plan: SNF    Current Diagnoses: Patient Active Problem List   Diagnosis Date Noted  . Malnutrition of moderate degree 11/28/2019  . Goals of care, counseling/discussion   . Palliative care by specialist   . DNR (do not resuscitate) discussion   . Hypercalcemia   . Tachycardia   . Weakness   . Acute kidney injury superimposed on CKD ([place])   . Acute cystitis with hematuria   . Acute respiratory failure with hypoxia ([place]) 11/22/2019  . COVID-19 11/22/2019  . Bilateral pneumonia 09/28/2019  . Shortness of breath 09/26/2019  . Hyperlipidemia   . Paranoid schizophrenia ([place])   . Type 2 diabetes mellitus with hyperlipidemia ([place])   . Hypothyroidism   . CKD (chronic kidney disease), stage IIIa   . Acute on chronic respiratory failure with hypoxia ([place])   . Acute metabolic encephalopathy 74/02/8785  . Altered mental state 08/17/2016  . DDD (degenerative disc disease), lumbar 07/09/2015  . Facet syndrome, lumbar 07/09/2015  . Lumbar radiculopathy 07/09/2015  . Sacroiliac joint dysfunction 07/09/2015  . Spinal stenosis, lumbar region, 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  . Diabetic neuropathy ([place]) 07/09/2015  . CAD in native artery 06/16/2015  . Controlled type 2 diabetes mellitus without complication ([place]) 76/72/0947  .  Benign essential HTN 06/16/2015  . Obstructive apnea 01/05/2015  . Esophagitis, reflux 08/06/2014  . Edema leg 05/14/2014  . Chronic systolic heart failure ([place]) 05/14/2014  . TI (tricuspid incompetence) 05/14/2014  . Chest pain 12/13/2013  . Combined fat and carbohydrate induced hyperlipemia 10/09/2013  . Breath shortness 10/09/2013    Orientation RESPIRATION BLADDER Height & Weight     Self  O2 (chronic 2L) Incontinent Weight: 82.1 kg Height:  5\' 3"  (160 cm)  BEHAVIORAL SYMPTOMS/MOOD NEUROLOGICAL BOWEL NUTRITION STATUS      Incontinent Diet (see discharge summary)  AMBULATORY STATUS COMMUNICATION OF NEEDS Skin   Extensive Assist Verbally Bruising                       Personal Care Assistance Level of Assistance  Bathing, Feeding, Dressing Bathing Assistance: Maximum assistance Feeding assistance: Maximum assistance Dressing Assistance: Maximum assistance     Functional Limitations Info             SPECIAL CARE FACTORS FREQUENCY  PT (By licensed PT), OT (By licensed OT)     PT Frequency: 5 times per week OT Frequency: 5 times per week            Contractures Contractures Info: Not present    Additional Factors Info  Code Status, Allergies Code Status Info: Full Allergies Info: NKA           Current Medications (12/05/2019):  This is the current hospital active medication list Current Facility-Administered Medications  Medication Dose Route Frequency Provider Last Rate Last Admin  .  acetaminophen (TYLENOL) 160 MG/5ML solution 650 mg  650 mg Per Tube Q6H PRN Lorella Nimrod, MD      . albuterol (VENTOLIN HFA) 108 (90 Base) MCG/ACT inhaler 2 puff  2 puff Inhalation Q6H Lorella Nimrod, MD   2 puff at 12/05/19 1121  . ARIPiprazole (ABILIFY) tablet 30 mg  30 mg Per Tube QHS Benita Gutter, RPH   30 mg at 12/04/19 1929  . ascorbic acid (VITAMIN C) tablet 500 mg  500 mg Per Tube Daily Benita Gutter, RPH   500 mg at 12/05/19 1120  . aspirin chewable  tablet 81 mg  81 mg Per Tube Daily Benita Gutter, RPH   81 mg at 12/05/19 1120  . benztropine (COGENTIN) tablet 0.5 mg  0.5 mg Per Tube BID Benita Gutter, RPH   0.5 mg at 12/05/19 1121  . carvedilol (COREG) tablet 3.125 mg  3.125 mg Per Tube BID WC Lockie Mola B, RPH   3.125 mg at 12/05/19 1120  . chlorpheniramine-HYDROcodone (TUSSIONEX) 10-8 MG/5ML suspension 5 mL  5 mL Per Tube Q12H PRN Lorella Nimrod, MD      . cholecalciferol (VITAMIN D3) tablet 1,000 Units  1,000 Units Per Tube Daily Benita Gutter, RPH   1,000 Units at 12/05/19 1120  . dextromethorphan-guaiFENesin (ROBITUSSIN-DM) 10-100 MG/5ML liquid 5 mL  5 mL Per Tube Q4H PRN Lorella Nimrod, MD      . docusate (COLACE) 50 MG/5ML liquid 100 mg  100 mg Per Tube BID PRN Lorella Nimrod, MD      . enoxaparin (LOVENOX) injection 40 mg  40 mg Subcutaneous Q24H Lorella Nimrod, MD   40 mg at 12/05/19 1121  . feeding supplement (JEVITY 1.5 CAL/FIBER) liquid 1,000 mL  1,000 mL Per Tube Continuous Lorella Nimrod, MD 55 mL/hr at 12/02/19 2115 1,000 mL at 12/02/19 2115  . feeding supplement (PROSource TF) liquid 45 mL  45 mL Per Tube BID Lorella Nimrod, MD   45 mL at 12/04/19 2220  . fluticasone (FLONASE) 50 MCG/ACT nasal spray 2 spray  2 spray Each Nare Daily Lorella Nimrod, MD   2 spray at 12/05/19 1121  . free water 250 mL  250 mL Per Tube Q4H Lorella Nimrod, MD   250 mL at 12/05/19 0416  . insulin aspart (novoLOG) injection 0-20 Units  0-20 Units Subcutaneous TID WC Lorella Nimrod, MD   11 Units at 12/05/19 0850  . insulin aspart (novoLOG) injection 0-5 Units  0-5 Units Subcutaneous QHS Amin, Soundra Pilon, MD      . insulin detemir (LEVEMIR) injection 8 Units  8 Units Subcutaneous BID Lorella Nimrod, MD   8 Units at 12/05/19 1120  . levothyroxine (SYNTHROID) tablet 25 mcg  25 mcg Per Tube QAC breakfast Benita Gutter, RPH   25 mcg at 12/05/19 0542  . liver oil-zinc oxide (DESITIN) 40 % ointment   Topical TID Lorella Nimrod, MD   Given at 12/05/19 1122  .  MEDLINE mouth rinse  15 mL Mouth Rinse BID Lorella Nimrod, MD   15 mL at 12/05/19 1122  . methylPREDNISolone sodium succinate (SOLU-MEDROL) 40 mg/mL injection 30 mg  30 mg Intravenous Daily Lorella Nimrod, MD   30 mg at 12/05/19 1122  . metoprolol tartrate (LOPRESSOR) injection 5 mg  5 mg Intravenous Q1H PRN Lorella Nimrod, MD   2.5 mg at 11/26/19 2019  . mirtazapine (REMERON) tablet 15 mg  15 mg Oral QHS Eulas Post, MD      . pantoprazole (PROTONIX) injection  40 mg  40 mg Intravenous QHS Lorella Nimrod, MD   40 mg at 12/04/19 2220  . polyethylene glycol (MIRALAX / GLYCOLAX) packet 17 g  17 g Per Tube Daily Lorella Nimrod, MD   17 g at 12/04/19 0825  . senna (SENOKOT) tablet 8.6 mg  1 tablet Per Tube QHS Benita Gutter, RPH   8.6 mg at 12/03/19 2100  . sertraline (ZOLOFT) tablet 100 mg  100 mg Per Tube q AM Lockie Mola B, RPH   100 mg at 12/05/19 1123  . traZODone (DESYREL) tablet 50 mg  50 mg Oral QHS PRN Lorella Nimrod, MD   50 mg at 12/04/19 1928  . zinc sulfate capsule 220 mg  220 mg Per Tube Daily Lorella Nimrod, MD   220 mg at 12/05/19 1120     Discharge Medications: Please see discharge summary for a list of discharge medications.  Relevant Imaging Results:  Relevant Lab Results:   Additional Information [id number]  Shelbie Hutching, RN

## 2019-12-05 NOTE — Care Management Important Message (Signed)
Important Message  Patient Details  Name: Elizabeth Gutierrez MRN: [id number] Date of Birth: 1960/09/12   Medicare Important Message Given:  Yes  Reviewed with Earna Coder over phone.  Per mother, has not received copy of Medicare IM in mail.  Requested copy be sent to her attention at [address], [place], [place] [address].  Confirmed address.  Copy sent via mail to mother's attention.     Dannette Barbara 12/05/2019, 3:08 PM

## 2019-12-05 NOTE — Progress Notes (Signed)
Inpatient Diabetes Program Recommendations  AACE/ADA: New Consensus Statement on Inpatient Glycemic Control (2015)  Target Ranges:  Prepandial:   less than 140 mg/dL      Peak postprandial:   less than 180 mg/dL (1-2 hours)      Critically ill patients:  140 - 180 mg/dL   Results for JEWELL, HAUGHT (MRN [id number]) as of 12/05/2019 16:42  Ref. Range 12/05/2019 08:15 12/05/2019 12:21  Glucose-Capillary Latest Ref Range: 70 - 99 mg/dL 274 (H)  11 units NOVOLOG  226 (H)  7 units NOVOLOG +  8 units LEVEMIR     Home DM Meds: Metformin 1000 mg Daily   Current Orders: Levemir 8 units BID       Novolog 0-20 units ac/hs    Solumedrol reduced to 30 mg Daily   MD- Per nursing, pt ate well at lunch today.  If afternoon CBGs remain elevated, may consider adding Novolog Meal Coverage:  Novolog 4 units TID with meals to start  (Please add the following Hold Parameters: Hold if pt eats <50% of meal, Hold if pt NPO)     --Will follow patient during hospitalization--  Wyn Quaker RN, MSN, CDE Diabetes Coordinator Inpatient Glycemic Control Team Team Pager: [phone] (8a-5p)

## 2019-12-05 NOTE — TOC Progression Note (Signed)
Transition of Care [facility]) - Progression Note    Patient Details  Name: Elizabeth Gutierrez MRN: [id number] Date of Birth: [birth year]  [facility] CM/SW Contact  Shelbie Hutching, RN Phone Number: 12/05/2019, 12:25 PM  Clinical Narrative:    Patient seems to be improving, plan to remove the NG tube today.  PT still needs to evaluate but OT is recommending SNF.  Bed search started.     Expected Discharge Plan: [place] (Group Home) Barriers to Discharge: Continued Medical Work up  Expected Discharge Plan and Services Expected Discharge Plan: [place] (Group Home)   Discharge Planning Services: CM Consult Post Acute Care Choice: [place] arrangements for the past 2 months: [place]: [facility])         Social Determinants of Health (SDOH) Interventions    Readmission Risk Interventions No flowsheet data found.

## 2019-12-05 NOTE — Progress Notes (Signed)
Spoke to mother Blanch Media and updated her on pt situation.

## 2019-12-05 NOTE — Consult Note (Signed)
[facility] Consult   Reason for Consult:   Need to adjust--medications in the face of Encephalopathy and Metabolic issues post effects of COVID   Referring Physician:   [place]  Patient Identification: Elizabeth Gutierrez MRN:  [id number] Principal Diagnosis: COVID-19 Diagnosis:  Principal Problem:   COVID-19 Active Problems:   Chronic systolic heart failure (HCC)   CAD in native artery   Controlled type 2 diabetes mellitus without complication (HCC)   Benign essential HTN   Altered mental state   Paranoid schizophrenia ([place])   Type 2 diabetes mellitus with hyperlipidemia ([place])   Hypothyroidism   Acute respiratory failure with hypoxia ([place])   Acute kidney injury superimposed on CKD ([place])   Weakness   Hypercalcemia   Tachycardia   Goals of care, counseling/discussion   Palliative care by specialist   DNR (do not resuscitate) discussion   Malnutrition of moderate degree   History of Chronic Schizophrenia   Total Time spent with patient:   One hour including discussion with Mom who gave most of the history    Subjective:    " I do not know but getting better  History limited by noise in the room vents and patients" ability to listen and express-- "   Elizabeth Gutierrez is a 59 y.o. female patient admitted with   Above medical problems   HPI:   Long history of Schizophrenia ----since high school, best medicines have been Abilify and Lithium.  Ltihium stopped due to electolyte changes but also has hypothyroidism.  Mom reports she is stable at group home pre  covid   Past Psychiatric History:   Psychosis on and off since HS ---  Last admissions were   Risk to Self:   None  Risk to Others:  none  Prior Inpatient Therapy:   Prior Outpatient Therapy:  followed by outpatient Psychiatry   Past Medical History:  Past Medical History:  Diagnosis Date  . CHF (congestive heart failure) ([place])   . Coronary artery disease   . Diabetes ([place])   . Heart attack ([place])    . Hyperlipidemia   . Hypertension   . MR (mental retardation)    Mild  . Paranoid schizophrenia ([place])   . Thyroid disease     Past Surgical History:  Procedure Laterality Date  . FOOT SURGERY Right    Family History:  Family History  Problem Relation Age of Onset  . Alcohol abuse Father   . Breast cancer Neg Hx    Family Psychiatric  History:   Paternal Uncle with Psychosis    Social History:  Ongoing group home living    Drugs / EToh history  None   Court and legal issues none   No BC or developmental delays    HS --was not able to finish 11th grade   She is on disability and SSI   She has been able to return to group home.     Was working once in a while at [employer]  and had worked at shelter     Social History   Substance and Sexual Activity  Alcohol Use No  . Alcohol/week: 0.0 standard drinks     Social History   Substance and Sexual Activity  Drug Use No    Social History   Socioeconomic History  . Marital status: Single    Spouse name: Not on file  . Number of children: Not on file  . Years of education: Not on file  . Highest  education level: Not on file  Occupational History  . Not on file  Tobacco Use  . Smoking status: Former Smoker    Quit date: 07/08/2013    Years since quitting: 6.4  . Smokeless tobacco: Former Network engineer and Sexual Activity  . Alcohol use: No    Alcohol/week: 0.0 standard drinks  . Drug use: No  . Sexual activity: Not on file  Other Topics Concern  . Not on file  Social History Narrative  . Not on file   Social Determinants of Health   Financial Resource Strain:   . Difficulty of Paying Living Expenses: Not on file  Food Insecurity:   . Worried About Charity fundraiser in the Last Year: Not on file  . Ran Out of Food in the Last Year: Not on file  Transportation Needs:   . Lack of Transportation (Medical): Not on file  . Lack of Transportation (Non-Medical): Not on file  Physical  Activity:   . Days of Exercise per Week: Not on file  . Minutes of Exercise per Session: Not on file  Stress:   . Feeling of Stress : Not on file  Social Connections:   . Frequency of Communication with Friends and Family: Not on file  . Frequency of Social Gatherings with Friends and Family: Not on file  . Attends Religious Services: Not on file  . Active Member of Clubs or Organizations: Not on file  . Attends Archivist Meetings: Not on file  . Marital Status: Not on file   Additional Social History:    Mom and sister ---do visit her, she currently no major personali relationships   Allergies:  No Known Allergies  Labs:  Results for orders placed or performed during the hospital encounter of 11/22/19 (from the past 48 hour(s))  Glucose, capillary     Status: Abnormal   Collection Time: 12/03/19 11:41 AM  Result Value Ref [place]   Glucose-Capillary 277 (H) 70 - 99 mg/dL    Comment: Glucose reference range applies only to samples taken after fasting for at least 8 hours.  Glucose, capillary     Status: Abnormal   Collection Time: 12/03/19  4:25 PM  Result Value Ref [place]   Glucose-Capillary 286 (H) 70 - 99 mg/dL    Comment: Glucose reference range applies only to samples taken after fasting for at least 8 hours.  Glucose, capillary     Status: Abnormal   Collection Time: 12/03/19  7:43 PM  Result Value Ref [place]   Glucose-Capillary 243 (H) 70 - 99 mg/dL    Comment: Glucose reference range applies only to samples taken after fasting for at least 8 hours.  Glucose, capillary     Status: Abnormal   Collection Time: 12/03/19 11:31 PM  Result Value Ref [place]   Glucose-Capillary 204 (H) 70 - 99 mg/dL    Comment: Glucose reference range applies only to samples taken after fasting for at least 8 hours.  Glucose, capillary     Status: Abnormal   Collection Time: 12/04/19  4:26 AM  Result Value Ref [place]   Glucose-Capillary 68 (L) 70 - 99 mg/dL    Comment: Glucose  reference range applies only to samples taken after fasting for at least 8 hours.  Glucose, capillary     Status: Abnormal   Collection Time: 12/04/19  6:19 AM  Result Value Ref [place]   Glucose-Capillary 124 (H) 70 - 99 mg/dL    Comment: Glucose reference range  applies only to samples taken after fasting for at least 8 hours.  Glucose, capillary     Status: Abnormal   Collection Time: 12/04/19  8:07 AM  Result Value Ref [place]   Glucose-Capillary 173 (H) 70 - 99 mg/dL    Comment: Glucose reference range applies only to samples taken after fasting for at least 8 hours.  Comprehensive metabolic panel     Status: Abnormal   Collection Time: 12/04/19 11:22 AM  Result Value Ref [place]   Sodium 138 135 - 145 mmol/L    Comment: RESULTS VERIFIED BY REPEAT TESTING   Potassium 4.3 3.5 - 5.1 mmol/L   Chloride 109 98 - 111 mmol/L   CO2 22 22 - 32 mmol/L   Glucose, Bld 225 (H) 70 - 99 mg/dL    Comment: Glucose reference range applies only to samples taken after fasting for at least 8 hours.   BUN 41 (H) 6 - 20 mg/dL   Creatinine, Ser 0.90 0.44 - 1.00 mg/dL   Calcium 9.8 8.9 - 10.3 mg/dL   Total Protein 5.4 (L) 6.5 - 8.1 g/dL   Albumin 2.4 (L) 3.5 - 5.0 g/dL   AST 16 15 - 41 U/L   ALT 14 0 - 44 U/L   Alkaline Phosphatase 58 38 - 126 U/L   Total Bilirubin 0.5 0.3 - 1.2 mg/dL   GFR calc non Af Amer >60 >60 mL/min   GFR calc Af Amer >60 >60 mL/min   Anion gap 7 5 - 15    Comment: Performed at [facility], [address] [place], [place] [address]  Glucose, capillary     Status: Abnormal   Collection Time: 12/04/19 11:42 AM  Result Value Ref [place]   Glucose-Capillary 191 (H) 70 - 99 mg/dL    Comment: Glucose reference range applies only to samples taken after fasting for at least 8 hours.  Glucose, capillary     Status: Abnormal   Collection Time: 12/04/19  4:12 PM  Result Value Ref [place]   Glucose-Capillary 230 (H) 70 - 99 mg/dL    Comment: Glucose reference range applies only to  samples taken after fasting for at least 8 hours.  Glucose, capillary     Status: Abnormal   Collection Time: 12/04/19  7:36 PM  Result Value Ref [place]   Glucose-Capillary 274 (H) 70 - 99 mg/dL    Comment: Glucose reference range applies only to samples taken after fasting for at least 8 hours.  Glucose, capillary     Status: Abnormal   Collection Time: 12/05/19  4:48 AM  Result Value Ref [place]   Glucose-Capillary 280 (H) 70 - 99 mg/dL    Comment: Glucose reference range applies only to samples taken after fasting for at least 8 hours.  Comprehensive metabolic panel     Status: Abnormal   Collection Time: 12/05/19  5:02 AM  Result Value Ref [place]   Sodium 138 135 - 145 mmol/L   Potassium 4.0 3.5 - 5.1 mmol/L   Chloride 104 98 - 111 mmol/L   CO2 27 22 - 32 mmol/L   Glucose, Bld 293 (H) 70 - 99 mg/dL    Comment: Glucose reference range applies only to samples taken after fasting for at least 8 hours.   BUN 36 (H) 6 - 20 mg/dL   Creatinine, Ser 1.00 0.44 - 1.00 mg/dL   Calcium 9.7 8.9 - 10.3 mg/dL   Total Protein 5.5 (L) 6.5 - 8.1 g/dL   Albumin 2.5 (  L) 3.5 - 5.0 g/dL   AST 13 (L) 15 - 41 U/L   ALT 13 0 - 44 U/L   Alkaline Phosphatase 61 38 - 126 U/L   Total Bilirubin 0.5 0.3 - 1.2 mg/dL   GFR calc non Af Amer >60 >60 mL/min   GFR calc Af Amer >60 >60 mL/min   Anion gap 7 5 - 15    Comment: Performed at [facility], [address] [place], [place] [address]  CBC     Status: Abnormal   Collection Time: 12/05/19  5:02 AM  Result Value Ref [place]   WBC 20.0 (H) 4.0 - 10.5 K/uL   RBC 4.67 3.87 - 5.11 MIL/uL   Hemoglobin 12.1 12.0 - 15.0 g/dL   HCT 40.8 36 - 46 %   MCV 87.4 80.0 - 100.0 fL   MCH 25.9 (L) 26.0 - 34.0 pg   MCHC 29.7 (L) 30.0 - 36.0 g/dL   RDW 15.2 11.5 - 15.5 %   Platelets 346 150 - 400 K/uL   nRBC 0.0 0.0 - 0.2 %    Comment: Performed at [facility], [address], [place], [place] [address]  Magnesium     Status: None   Collection Time:  12/05/19  5:02 AM  Result Value Ref [place]   Magnesium 2.3 1.7 - 2.4 mg/dL    Comment: Performed at [facility], [address] [place], [place] [address]  Phosphorus     Status: None   Collection Time: 12/05/19  5:02 AM  Result Value Ref [place]   Phosphorus 3.4 2.5 - 4.6 mg/dL    Comment: Performed at [facility], [address] [place], [place] [address]  Glucose, capillary     Status: Abnormal   Collection Time: 12/05/19  8:15 AM  Result Value Ref [place]   Glucose-Capillary 274 (H) 70 - 99 mg/dL    Comment: Glucose reference range applies only to samples taken after fasting for at least 8 hours.   Comment 1 Notify RN     Current Facility-Administered Medications  Medication Dose Route Frequency Provider Last Rate Last Admin  . acetaminophen (TYLENOL) 160 MG/5ML solution 650 mg  650 mg Per Tube Q6H PRN Lorella Nimrod, MD      . albuterol (VENTOLIN HFA) 108 (90 Base) MCG/ACT inhaler 2 puff  2 puff Inhalation Q6H Lorella Nimrod, MD   2 puff at 12/04/19 1929  . ARIPiprazole (ABILIFY) tablet 30 mg  30 mg Per Tube QHS Benita Gutter, RPH   30 mg at 12/04/19 1929  . ascorbic acid (VITAMIN C) tablet 500 mg  500 mg Per Tube Daily Benita Gutter, RPH   500 mg at 12/04/19 7673  . aspirin chewable tablet 81 mg  81 mg Per Tube Daily Benita Gutter, RPH   81 mg at 12/04/19 4193  . benztropine (COGENTIN) tablet 0.5 mg  0.5 mg Per Tube BID Benita Gutter, RPH   0.5 mg at 12/04/19 1928  . carvedilol (COREG) tablet 3.125 mg  3.125 mg Per Tube BID WC Lockie Mola B, RPH   3.125 mg at 12/04/19 1618  . chlorpheniramine-HYDROcodone (TUSSIONEX) 10-8 MG/5ML suspension 5 mL  5 mL Per Tube Q12H PRN Lorella Nimrod, MD      . cholecalciferol (VITAMIN D3) tablet 1,000 Units  1,000 Units Per Tube Daily Benita Gutter, RPH   1,000 Units at 12/04/19 7902  . dextromethorphan-guaiFENesin (ROBITUSSIN-DM) 10-100 MG/5ML liquid 5 mL  5 mL  Per Tube Q4H PRN Lorella Nimrod, MD      . docusate  (COLACE) 50 MG/5ML liquid 100 mg  100 mg Per Tube BID PRN Lorella Nimrod, MD      . enoxaparin (LOVENOX) injection 40 mg  40 mg Subcutaneous Q24H Lorella Nimrod, MD   40 mg at 12/04/19 0821  . feeding supplement (JEVITY 1.5 CAL/FIBER) liquid 1,000 mL  1,000 mL Per Tube Continuous Lorella Nimrod, MD 55 mL/hr at 12/02/19 2115 1,000 mL at 12/02/19 2115  . feeding supplement (PROSource TF) liquid 45 mL  45 mL Per Tube BID Lorella Nimrod, MD   45 mL at 12/04/19 2220  . fluticasone (FLONASE) 50 MCG/ACT nasal spray 2 spray  2 spray Each Nare Daily Lorella Nimrod, MD   2 spray at 12/04/19 0821  . free water 250 mL  250 mL Per Tube Q4H Lorella Nimrod, MD   250 mL at 12/05/19 0416  . insulin aspart (novoLOG) injection 0-20 Units  0-20 Units Subcutaneous TID WC Lorella Nimrod, MD   7 Units at 12/04/19 1618  . insulin aspart (novoLOG) injection 0-5 Units  0-5 Units Subcutaneous QHS Amin, Soundra Pilon, MD      . insulin detemir (LEVEMIR) injection 8 Units  8 Units Subcutaneous BID Lorella Nimrod, MD   8 Units at 12/04/19 2220  . levothyroxine (SYNTHROID) tablet 25 mcg  25 mcg Per Tube QAC breakfast Benita Gutter, RPH   25 mcg at 12/05/19 0542  . liver oil-zinc oxide (DESITIN) 40 % ointment   Topical TID Lorella Nimrod, MD   Given at 12/04/19 2220  . MEDLINE mouth rinse  15 mL Mouth Rinse BID Lorella Nimrod, MD   15 mL at 12/04/19 2220  . methylPREDNISolone sodium succinate (SOLU-MEDROL) 40 mg/mL injection 30 mg  30 mg Intravenous Daily Lorella Nimrod, MD   30 mg at 12/04/19 0818  . metoprolol tartrate (LOPRESSOR) injection 5 mg  5 mg Intravenous Q1H PRN Lorella Nimrod, MD   2.5 mg at 11/26/19 2019  . pantoprazole (PROTONIX) injection 40 mg  40 mg Intravenous QHS Lorella Nimrod, MD   40 mg at 12/04/19 2220  . polyethylene glycol (MIRALAX / GLYCOLAX) packet 17 g  17 g Per Tube Daily Lorella Nimrod, MD   17 g at 12/04/19 0825  . senna (SENOKOT) tablet 8.6 mg  1 tablet Per Tube QHS Benita Gutter, RPH   8.6 mg at 12/03/19 2100  .  sertraline (ZOLOFT) tablet 100 mg  100 mg Per Tube q AM Lockie Mola B, RPH   100 mg at 12/04/19 0824  . traZODone (DESYREL) tablet 50 mg  50 mg Oral QHS PRN Lorella Nimrod, MD   50 mg at 12/04/19 1928  . zinc sulfate capsule 220 mg  220 mg Per Tube Daily Lorella Nimrod, MD   220 mg at 12/04/19 1610    Musculoskeletal: Strength & Muscle Tone:  [possible deconditioning   Gait & Station:  Limited  Patient leans:  N/a   Psychiatric Specialty Exam: Physical Exam  Review of Systems  Blood pressure (!) 101/57, pulse 85, temperature 98 F (36.7 C), resp. rate 20, height 5\' 3"  (1.6 m), weight 82.1 kg, SpO2 92 %.Body mass index is 32.06 kg/m.  Mental Status limited Also taken from Mom  Alert cooperative as much as possible It is hard for her to be verbal Eye contact okay  Consciousness improving with fluctuance and   And cloudiness Concentration and attention improving Speech she answers in low tone volume  fluency  Memory cannot assess  Fund of knowledge intelligence mom says she is below average judgement reliability actually okay  No active SI HI or plans  Contracts for safety at this time  Abstraction cannot assess    Recall actually normal Language English  Assets,  Supportive Mom and sister who are co-guardians Akathisia none Handedness  Normal  Cognition ---impaired but improving  ADLS ---impaired from Covid  Aims not done  Sleep ---erratic by report even prior to covid                                              Treatment Plan Summary:  Caucasian female with prior stable management of C/S   Currently off lithium due to metabolic precaution.   Continues Abilify qhs  But add Remeron at HS for mood, sleep appetite      Disposition:   Remains in [place] treatment area ---pending stability and transfer when appropriate with above meds maintained so far for her. For Psych  Eulas Post, MD 12/05/2019 9:47 AM

## 2019-12-05 NOTE — Progress Notes (Signed)
PROGRESS NOTE    Elizabeth Gutierrez  YQM:[id number] DOB: 01-24-1961 DOA: 11/22/2019 PCP: Denton Lank, MD   Brief Narrative: Taken from H&P. The patient is a 59 yr old cognitively impaired woman who lives in a group home. She was diagnosed with COVID 19 on 11/10/2019.  past medical history significant for CHF (EF 30-35% from echo 09/27/2019), CAD (Hx MI), DM II, Hypertension, Hyperlipidemia, Paranoid Schizophrenia, Thyroid disease. Admitted for COVID-19 pneumonia, UTI with altered mental status.  Requiring up to 9 L with HFNC, uses 2 L at home. Completed the treatment with remdesivir and UTI. Developed hypernatremia and hypercalcemia, started improving.  Subjective: Patient was more alert and talking, does not make much sensible sentences.  Later notified by nursing staff that patient had a fall from bed to ground.  No significant injuries noted.  Assessment & Plan:   Principal Problem:   COVID-19 Active Problems:   Chronic systolic heart failure (HCC)   CAD in native artery   Controlled type 2 diabetes mellitus without complication (HCC)   Benign essential HTN   Altered mental state   Paranoid schizophrenia ([place])   Type 2 diabetes mellitus with hyperlipidemia (HCC)   Hypothyroidism   Acute respiratory failure with hypoxia (HCC)   Acute kidney injury superimposed on CKD (HCC)   Weakness   Hypercalcemia   Tachycardia   Goals of care, counseling/discussion   Palliative care by specialist   DNR (do not resuscitate) discussion   Malnutrition of moderate degree  Acute hypoxic respiratory failure secondary to COVID-19 pneumonia.   Patient is at baseline for her oxygen requirement now.  Saturating well on 2 L.  Completed the treatment for remdesivir.  Did receive baricitinib which was discontinued due to worsening leukocytosis.  Completed a course of Zithromax and cefepime.  Elevated inflammatory markers which are now improving.  Elevated procalcitonin at 1.34 initially. MRSA nasal swab  was negative. -Continue steroid taper. -Continue supportive care. -Continue supplements. -Palliative was consulted due to worsening encephalopathy-mother needs some time before making some decisions regarding CODE STATUS and further management.  Patient will remain full code till that time.  Fall.  Patient had a fall from bed to floor today.  No significant injuries noted.  Not sure about whether she hit her head or not. -Obtain CT head. -Continue to monitor. -Fall precautions.  Acute metabolic encephalopathy.  Some improvement in mentation . Able to follow command.  May be at baseline.  Found to have hypercalcimia and worsening hypernatremia which can contribute to encephalopathy.  Patient also has underlying psych illness and on multiple psych meds.  -Blood cultures remain negative with normal lactic acid. -Keep holding trazodone, lithium and Neurontin. -Repeat swallow evaluation. -PT evaluation.  Hyperparathyroidism/ hypercalcemia.  Calcium continue to improve.  Corrected calcium of 10.9 today. hypercalcemia labs with mildly decreased vitamin D and elevated parathyroid.  Protein electrophoresis within normal limits. Patient also has CKD stage IIIb.-Complete the course of Miacalcin. PTH related peptide <2.0. -Consult nephrology.-Appreciate their help. -Continue p.o. hydration.  Hypernatremia.  Resolved.  Sodium of 138 today  Lithium was also discontinued as that can also contribute to hypernatremia.  -Continue p.o. hydration.  Nursing concern of aspiration.  Most likely secondary to encephalopathy.  NG tube was placed for few days and she was started on nutrition along with free water to help with hyponatremia.  Patient appears little more alert. We will remove NG tube and try p.o. intake according to swallow team recommendations with dysphagia diet and nectar thick liquids. Electrolytes  were monitored for concern of refeeding syndrome-remained stable.  HFrEF.  Patient has EF of 30  to 35% according to echo done in July 2021. -We will observe volume status as she needs IV fluid for worsening hypernatremia and hypercalcemia.  Diabetes mellitus.  Elevated CBG, improved as compared to before. -Continue frequent CBG check and insulin according to sliding scale.  AKI with CKD stage II.  Resolved.  I am not sure about CKD stage III as patient's creatinine has been completely normalized.  May be stage 2. -Continue to monitor renal function. -Avoid nephrotoxins.  E. coli UTI.  Completed the course of antibiotics.  Acute urinary retention.  Patient developed acute urinary retention with abdominal pain requiring Foley catheter. Foley was discontinued  and patient is able to void.  Schizophrenia.  Patient lives in a group home.  Lithium within therapeutic range on admission.  Lithium and trazodone was discontinued due to worsening hypernatremia and mental status. -Continue rest of psych meds. -Psychiatry was consulted to help with her psych meds as we did stop  Lithium-we will appreciate their recommendations.  Neuropathy.  Her home dose of gabapentin is being held due to increased lethargy and altered mental status.  Can be restarted if patient started complaining about pain.  Hypothyroidism.  Due to TSH in hyperthyroid range her home dose of Synthroid was decreased during current hospitalization. -Continue Synthroid-patient will need a repeat TSH in 4 to 6 weeks.  Physical deconditioning/generalized weakness.   - PT and OT evaluation-recommending SNF placement.  Objective: Vitals:   12/04/19 1933 12/04/19 2323 12/05/19 0446 12/05/19 0450  BP: 103/71 (!) 101/57    Pulse: 84 85    Resp: 20 20    Temp: 98.6 F (37 C) 98.5 F (36.9 C) 98 F (36.7 C)   TempSrc: Oral Oral    SpO2: 94% 92%    Weight:    82.1 kg  Height:        Intake/Output Summary (Last 24 hours) at 12/05/2019 0859 Last data filed at 12/04/2019 2100 Gross per 24 hour  Intake --  Output 700 ml  Net  -700 ml   Filed Weights   12/03/19 0500 12/04/19 0500 12/05/19 0450  Weight: 81.4 kg 82.3 kg 82.1 kg    Examination:  General. Chronically ill-appearing, cognitively impaired lady, In no acute distress. Pulmonary.  Lungs clear bilaterally, normal respiratory effort. CV.  Regular rate and rhythm, no JVD, rub or murmur. Abdomen.  Soft, nontender, nondistended, BS positive. CNS.  Alert and oriented x3.  No focal neurologic deficit. Extremities.  No edema, no cyanosis, pulses intact and symmetrical. Psychiatry.  Judgment and insight appears normal.  DVT prophylaxis: Lovenox Code Status: Full Family Communication: Mother was updated on phone. Disposition Plan:  Status is: Inpatient  Remains inpatient appropriate because:Inpatient level of care appropriate due to severity of illness   Dispo: The patient is from: Group home              Anticipated d/c is to: SNF              Anticipated d/c date is: 2-3 days.              Patient currently is not medically stable to d/c. Pt. Will be going to SNF before returning back to her group home.  Consultants:   Palliative  Nephrology  Procedures:   Antimicrobials:   Cefepime  Data Reviewed: I have personally reviewed following labs and imaging studies  CBC: Recent Labs  Lab 11/30/19 0520 12/01/19 0545 12/02/19 0508 12/03/19 0520 12/05/19 0502  WBC 22.9* 20.6* 20.0* 23.0* 20.0*  HGB 13.0 12.6 13.0 14.0 12.1  HCT 44.7 41.1 42.7 45.7 40.8  MCV 89.0 87.4 86.3 85.7 87.4  PLT 381 311 339 396 426   Basic Metabolic Panel: Recent Labs  Lab 11/30/19 0520 11/30/19 1819 12/01/19 0545 12/01/19 1255 12/02/19 0508 12/02/19 1103 12/03/19 0520 12/04/19 1122 12/05/19 0502  NA 149*   < > 145   < > 147* 146* 148* 138 138  K 3.9   < > 4.3   < > 3.6 4.0 3.5 4.3 4.0  CL 121*   < > 116*   < > 115* 116* 114* 109 104  CO2 20*   < > 22   < > 25 22 25 22 27   GLUCOSE 345*   < > 344*   < > 270* 249* 138* 225* 293*  BUN 70*   < > 56*   <  > 54* 53* 45* 41* 36*  CREATININE 1.67*   < > 1.26*   < > 1.13* 1.04* 1.14* 0.90 1.00  CALCIUM 11.0*   < > 10.9*   < > 10.8* 10.4* 11.0* 9.8 9.7  MG 2.6*  --  2.3  --  2.6*  --  2.7*  --  2.3  PHOS 3.0  --  3.1  --  2.7  --  2.4*  --  3.4   < > = values in this interval not displayed.   GFR: Estimated Creatinine Clearance: 61.5 mL/min (by C-G formula based on SCr of 1 mg/dL). Liver Function Tests: Recent Labs  Lab 12/01/19 0545 12/02/19 0508 12/03/19 0520 12/04/19 1122 12/05/19 0502  AST 12* 13* 16 16 13*  ALT 7 9 13 14 13   ALKPHOS 59 59 65 58 61  BILITOT 0.8 0.8 0.8 0.5 0.5  PROT 5.6* 5.6* 6.3* 5.4* 5.5*  ALBUMIN 2.5* 2.5* 2.8* 2.4* 2.5*   No results for input(s): LIPASE, AMYLASE in the last 168 hours. No results for input(s): AMMONIA in the last 168 hours. Coagulation Profile: No results for input(s): INR, PROTIME in the last 168 hours. Cardiac Enzymes: No results for input(s): CKTOTAL, CKMB, CKMBINDEX, TROPONINI in the last 168 hours. BNP (last 3 results) No results for input(s): PROBNP in the last 8760 hours. HbA1C: No results for input(s): HGBA1C in the last 72 hours. CBG: Recent Labs  Lab 12/04/19 1142 12/04/19 1612 12/04/19 1936 12/05/19 0448 12/05/19 0815  GLUCAP 191* 230* 274* 280* 274*   Lipid Profile: No results for input(s): CHOL, HDL, LDLCALC, TRIG, CHOLHDL, LDLDIRECT in the last 72 hours. Thyroid Function Tests: No results for input(s): TSH, T4TOTAL, FREET4, T3FREE, THYROIDAB in the last 72 hours. Anemia Panel: No results for input(s): VITAMINB12, FOLATE, FERRITIN, TIBC, IRON, RETICCTPCT in the last 72 hours. Sepsis Labs: Recent Labs  Lab 11/29/19 0515  PROCALCITON 0.86    Recent Results (from the past 240 hour(s))  CULTURE, BLOOD (ROUTINE X 2) w Reflex to ID Panel     Status: None   Collection Time: 11/27/19  4:57 PM   Specimen: BLOOD  Result Value Ref Range Status   Specimen Description BLOOD RAC  Final   Special Requests   Final     BOTTLES DRAWN AEROBIC AND ANAEROBIC Blood Culture adequate volume   Culture   Final    NO GROWTH 5 DAYS Performed at [facility], [address] [place], [place] [address]    Report Status  12/02/2019 FINAL  Final  CULTURE, BLOOD (ROUTINE X 2) w Reflex to ID Panel     Status: None   Collection Time: 11/27/19  5:01 PM   Specimen: BLOOD  Result Value Ref Range Status   Specimen Description BLOOD [facility]  Final   Special Requests   Final    BOTTLES DRAWN AEROBIC AND ANAEROBIC Blood Culture adequate volume   Culture   Final    NO GROWTH 5 DAYS Performed at [facility], [address] [place], [place] [address]    Report Status 12/02/2019 FINAL  Final  MRSA PCR Screening     Status: None   Collection Time: 11/29/19 10:28 AM   Specimen: Nasopharyngeal  Result Value Ref Range Status   MRSA by PCR NEGATIVE NEGATIVE Final    Comment:        The GeneXpert MRSA Assay (FDA approved for NASAL specimens only), is one component of a comprehensive MRSA colonization surveillance program. It is not intended to diagnose MRSA infection nor to guide or monitor treatment for MRSA infections. Performed at [facility], [address], [place], [place] [address]      Radiology Studies: No results found.  Scheduled Meds: . albuterol  2 puff Inhalation Q6H  . ARIPiprazole  30 mg Per Tube QHS  . vitamin C  500 mg Per Tube Daily  . aspirin  81 mg Per Tube Daily  . benztropine  0.5 mg Per Tube BID  . carvedilol  3.125 mg Per Tube BID WC  . cholecalciferol  1,000 Units Per Tube Daily  . enoxaparin (LOVENOX) injection  40 mg Subcutaneous Q24H  . feeding supplement (PROSource TF)  45 mL Per Tube BID  . fluticasone  2 spray Each Nare Daily  . free water  250 mL Per Tube Q4H  . insulin aspart  0-20 Units Subcutaneous TID WC  . insulin aspart  0-5 Units Subcutaneous QHS  . insulin detemir  8 Units Subcutaneous BID  . levothyroxine  25 mcg Per Tube QAC breakfast  .  liver oil-zinc oxide   Topical TID  . mouth rinse  15 mL Mouth Rinse BID  . methylPREDNISolone (SOLU-MEDROL) injection  30 mg Intravenous Daily  . pantoprazole (PROTONIX) IV  40 mg Intravenous QHS  . polyethylene glycol  17 g Per Tube Daily  . senna  1 tablet Per Tube QHS  . sertraline  100 mg Per Tube q AM  . zinc sulfate  220 mg Per Tube Daily   Continuous Infusions: . feeding supplement (JEVITY 1.5 CAL/FIBER) 1,000 mL (12/02/19 2115)     LOS: 13 days   Time spent: 30 minutes.  Lorella Nimrod, MD [facility]  If 7PM-7AM, please contact night-coverage [email or web address]  12/05/2019, 8:59 AM   This record has been created using Systems analyst. Errors have been sought and corrected,but may not always be located. Such creation errors do not reflect on the standard of care.

## 2019-12-05 NOTE — Progress Notes (Signed)
Nutrition Follow-up  DOCUMENTATION CODES:   Non-severe (moderate) malnutrition in context of chronic illness  INTERVENTION:  Discontinued tube feed orders.  Provide Nepro Shake po TID between meals, each supplement provides 425 kcal and 19 grams protein. Nepro is nectar-thick.  Provide Magic cup BID with lunch and dinner, each supplement provides 290 kcal and 9 grams of protein.  NUTRITION DIAGNOSIS:   Moderate Malnutrition related to chronic illness (CHF, suspected inadequate oral intake) as evidenced by mild fat depletion, mild muscle depletion, moderate muscle depletion, severe muscle depletion.  Ongoing.  GOAL:   Patient will meet greater than or equal to 90% of their needs  Previously met with TFs; progressing with PO intake.  MONITOR:   Diet advancement, Labs, Weight trends, TF tolerance, I & O's  REASON FOR ASSESSMENT:   Consult Enteral/tube feeding initiation and management  ASSESSMENT:   59 year old female with PMHx of DM, hypothyroidism, CAD, CHF, cognitive impairment, HTN, paranoid schizophrenia admitted with COVID-19 PNA, acute metabolic encephalopathy, AKI.   9/9 DHT inserted x2 but unable to remove stylet so TFs unable to be started 9/10 NGT placed and TFs initiated 9/16 SLP evaluation: diet advanced to dysphagia 1 with nectar-thick liquids 9/16 NGT removed  Met with patient at bedside. She was sitting up in bed eating lunch. Patient much more alert now. NGT has been removed today. She had finished all of her Magic Cup already and was working on the rest of the tray. Patient endorsed she was tolerating food well. She is amenable to eating Magic Cup and drinking oral nutrition supplements.  Medications reviewed and include: vitamin C 500 mg daily, vitamin D3 1000 units daily, Novolog 0-20 units TID, Novolog 0-5 units QHS, Levemir 8 units BID, levothyroxine, Solu-Medrol 30 mg daily IV, Remeron 15 mg QHS, Protonix, Miralax, senna, zinc sulfate 220 mg  daily.  Labs reviewed: CBG 226-280, BUN 36.  Weight trend: 82.1 kg on 9/16; +2.2 kg from 9/10; pt remained weight stable with TFs  Discussed with RN via secure chat.  Diet Order:   Diet Order            DIET - DYS 1 Room service appropriate? Yes; Fluid consistency: Nectar Thick  Diet effective now                EDUCATION NEEDS:   No education needs have been identified at this time  Skin:  Skin Assessment: Reviewed RN Assessment  Last BM:  12/04/2019 - large type 6  Height:   Ht Readings from Last 1 Encounters:  11/22/19 _0  (1.6 m)   Weight:   Wt Readings from Last 1 Encounters:  12/05/19 82.1 kg   Ideal Body Weight:  52.3 kg  BMI:  Body mass index is 32.06 kg/m.  Estimated Nutritional Needs:   Kcal:  1900-2100  Protein:  100-110 grams  Fluid:  1.9-2.1 L/day  Jacklynn Barnacle, MS, RD, LDN Pager number available on Amion

## 2019-12-05 NOTE — Evaluation (Signed)
Physical Therapy Evaluation Patient Details Name: Elizabeth Gutierrez MRN: [id number] DOB: 19-Jul-1960 Today's Date: 12/05/2019   History of Present Illness  Elizabeth Gutierrez is a 52yoF who comes [facility] on 9/[facility] c hypoxia and AMS. PMH: CHF c 3035%EJ, CAD, DM2, HTN, HLD, paranoid schizophrenia, thyroid disease. Pt admitted with COVID19 PNA.  Clinical Impression  Pt admitted with above diagnosis. Pt currently with functional limitations due to the deficits listed below (see "PT Problem List"). Upon entry, pt in bed, sleep, remains drowsy after waking, but more alert once assisted to EOB Pt has baseline I/DD and acute AMS that preclude establishing much background information, pt also very tangential and perseverates on drinking water, even though explained that NPO status need clarification from RN/SLP. Pt requires maxA for bed mobility, MaxA to come to standing 3x, attempts to sidestep toward center of bed, but knees buckle frequently. Functional mobility assessment demonstrates increased effort/time requirements, fair tolerance, and need for physical assistance, whereas the patient performed these at a higher level of independence PTA. Pt does well following simple cues in session. Pt will benefit from skilled PT intervention to increase independence and safety with basic mobility in preparation for discharge to the venue listed below.       Follow Up Recommendations SNF;Supervision for mobility/OOB    Equipment Recommendations  None recommended by PT    Recommendations for Other Services       Precautions / Restrictions Precautions Precautions: Fall      Mobility  Bed Mobility Overal bed mobility: Needs Assistance Bed Mobility: Supine to Sit;Sit to Supine     Supine to sit: Max assist Sit to supine: Max assist      Transfers Overall transfer level: Needs assistance Equipment used: 1 person hand held assist Transfers: Sit to/from Stand Sit to Stand: Max assist         General transfer  comment: performed 3x in session, pt veyr weak, legs rubbery. Pt reports legs feel weak.  Ambulation/Gait Ambulation/Gait assistance:  (unsafe to attempt due to weakness)              Stairs            Wheelchair Mobility    Modified Rankin (Stroke Patients Only)       Balance                                             Pertinent Vitals/Pain Pain Assessment: Faces Faces Pain Scale: Hurts little more Pain Location: pointing to anterior midline neck Pain Intervention(s): Limited activity within patient's tolerance    Home Living Family/patient expects to be discharged to:: Group home                 Additional Comments: unsure of setup/PLOF. Previous therapy notes indicate level entry and 24/7 assistance    Prior Function Level of Independence: Needs assistance   Gait / Transfers Assistance Needed: Pt reports using a walker, but then indicates that she walks "by herself". Unsure of efficacy  ADL's / Homemaking Assistance Needed: Per previous therapy assessment; Pt requires assistance to bathe and dress.        Hand Dominance        Extremity/Trunk Assessment   Upper Extremity Assessment Upper Extremity Assessment: Generalized weakness    Lower Extremity Assessment Lower Extremity Assessment: Generalized weakness       Communication  Cognition Arousal/Alertness: Awake/alert Behavior During Therapy: WFL for tasks assessed/performed Overall Cognitive Status: History of cognitive impairments - at baseline                                 General Comments: tangential, but able to be redirected.      General Comments      Exercises     Assessment/Plan    PT Assessment Patient needs continued PT services  PT Problem List Decreased strength;Decreased cognition;Decreased range of motion;Decreased knowledge of use of DME;Decreased activity tolerance;Decreased balance;Decreased mobility;Decreased safety  awareness;Decreased knowledge of precautions       PT Treatment Interventions DME instruction;Balance training;Gait training;Stair training;Functional mobility training;Therapeutic activities;Patient/family education;Cognitive remediation;Therapeutic exercise    PT Goals (Current goals can be found in the Care Plan section)  Acute Rehab PT Goals Patient Stated Goal: regain strength in standing PT Goal Formulation: With patient Time For Goal Achievement: 12/19/19 Potential to Achieve Goals: Good    Frequency Min 2X/week   Barriers to discharge        Co-evaluation               AM-PAC PT "6 Clicks" Mobility  Outcome Measure Help needed turning from your back to your side while in a flat bed without using bedrails?: A Lot Help needed moving from lying on your back to sitting on the side of a flat bed without using bedrails?: A Lot Help needed moving to and from a bed to a chair (including a wheelchair)?: A Lot Help needed standing up from a chair using your arms (e.g., wheelchair or bedside chair)?: A Lot Help needed to walk in hospital room?: Total Help needed climbing 3-5 steps with a railing? : Total 6 Click Score: 10    End of Session Equipment Utilized During Treatment: Oxygen Activity Tolerance: Patient tolerated treatment well;No increased pain;Patient limited by fatigue Patient left: in bed;with bed alarm set;with call bell/phone within reach Nurse Communication: Mobility status PT Visit Diagnosis: Difficulty in walking, not elsewhere classified (R26.2);Unsteadiness on feet (R26.81);Other abnormalities of gait and mobility (R26.89);Muscle weakness (generalized) (M62.81)    Time: 1031-5945 PT Time Calculation (min) (ACUTE ONLY): 13 min   Charges:   PT Evaluation $PT Eval Moderate Complexity: 1 Mod         3:20 PM, 12/05/19 Elizabeth Gutierrez, PT, DPT Physical Therapist - [facility]  [phone] ([place])      Bryant C 12/05/2019, 3:15 PM

## 2019-12-06 LAB — MAGNESIUM: Magnesium: 2.4 mg/dL (ref 1.7–2.4)

## 2019-12-06 LAB — COMPREHENSIVE METABOLIC PANEL
ALT: 14 U/L (ref 0–44)
AST: 16 U/L (ref 15–41)
Albumin: 2.6 g/dL — ABNORMAL LOW (ref 3.5–5.0)
Alkaline Phosphatase: 63 U/L (ref 38–126)
Anion gap: 8 (ref 5–15)
BUN: 33 mg/dL — ABNORMAL HIGH (ref 6–20)
CO2: 27 mmol/L (ref 22–32)
Calcium: 10.1 mg/dL (ref 8.9–10.3)
Chloride: 108 mmol/L (ref 98–111)
Creatinine, Ser: 0.95 mg/dL (ref 0.44–1.00)
GFR calc Af Amer: >60 mL/min (ref >60)
GFR calc non Af Amer: >60 mL/min (ref >60)
Glucose, Bld: 113 mg/dL — ABNORMAL HIGH (ref 70–99)
Potassium: 4.2 mmol/L (ref 3.5–5.1)
Sodium: 143 mmol/L (ref 135–145)
Total Bilirubin: 0.7 mg/dL (ref 0.3–1.2)
Total Protein: 5.8 g/dL — ABNORMAL LOW (ref 6.5–8.1)

## 2019-12-06 LAB — CBC
HCT: 40.3 % (ref 36.0–46.0)
Hemoglobin: 12.1 g/dL (ref 12.0–15.0)
MCH: 26.1 pg (ref 26.0–34.0)
MCHC: 30 g/dL (ref 30.0–36.0)
MCV: 86.9 fL (ref 80.0–100.0)
Platelets: 373 10*3/uL (ref 150–400)
RBC: 4.64 MIL/uL (ref 3.87–5.11)
RDW: 15.1 % (ref 11.5–15.5)
WBC: 17.4 10*3/uL — ABNORMAL HIGH (ref 4.0–10.5)
nRBC: 0 % (ref 0.0–0.2)

## 2019-12-06 LAB — GLUCOSE, CAPILLARY
Glucose-Capillary: 100 mg/dL — ABNORMAL HIGH (ref 70–99)
Glucose-Capillary: 105 mg/dL — ABNORMAL HIGH (ref 70–99)
Glucose-Capillary: 110 mg/dL — ABNORMAL HIGH (ref 70–99)
Glucose-Capillary: 351 mg/dL — ABNORMAL HIGH (ref 70–99)
Glucose-Capillary: 353 mg/dL — ABNORMAL HIGH (ref 70–99)
Glucose-Capillary: 451 mg/dL — ABNORMAL HIGH (ref 70–99)
Glucose-Capillary: 68 mg/dL — ABNORMAL LOW (ref 70–99)
Glucose-Capillary: 82 mg/dL (ref 70–99)
Glucose-Capillary: 93 mg/dL (ref 70–99)

## 2019-12-06 LAB — PHOSPHORUS: Phosphorus: 3.8 mg/dL (ref 2.5–4.6)

## 2019-12-06 MED ORDER — BENZTROPINE MESYLATE 1 MG PO TABS
1.0000 mg | ORAL_TABLET | Freq: Two times a day (BID) | ORAL | Status: DC
  Administered 2019-12-06 – 2019-12-07 (×2): 1 mg
  Filled 2019-12-06 (×3): qty 1

## 2019-12-06 NOTE — Progress Notes (Signed)
Physical Therapy Treatment Patient Details Name: Elizabeth Gutierrez MRN: [id number] DOB: 08-19-1960 Today's Date: 12/06/2019    History of Present Illness Elizabeth Gutierrez is a 17yoF who comes [facility] on 9/[facility] c hypoxia and AMS. PMH: CHF c 3035%EJ, CAD, DM2, HTN, HLD, paranoid schizophrenia, thyroid disease. Pt admitted with COVID19 PNA.    PT Comments    Pt was long sitting in bed, awake and eager to engage in conversation. Has cognition deficits at baseline but was cooperative and willing to participate. Does have poor insight of deficits and is disoriented to hospitalization however able to follow 1 step commands well with increased time to process. She was on 5 L o2 throughout session. Was able to exit R side of bed with increased time + mod assist. Sat EOB x several minutes prior to performing 4 x STS EOB. Vitals remained stable throughout with slightly elevated RR/HR with activity. sao2 > 92 % throughout. She was able to ambulate with RW + min assist ~ 20 ft. Does endorse fatigue and requested to have seated rest. At conclusion of session, pt was in recliner with BLEs elevated, chair alarm in place, and call bell in reach. Continued recommendation for SNF at DC to address deficits and improve safe functional mobility.      Follow Up Recommendations  SNF;Supervision for mobility/OOB     Equipment Recommendations  None recommended by PT    Recommendations for Other Services       Precautions / Restrictions Precautions Precautions: Fall Restrictions Weight Bearing Restrictions: No Other Position/Activity Restrictions: monitor O2/HR    Mobility  Bed Mobility Overal bed mobility: Needs Assistance Bed Mobility: Supine to Sit     Supine to sit: Mod assist;HOB elevated     General bed mobility comments: mod assist to exit R side of bed with increase time and constant vcs for improved technique, sequencing, and safety. pt does need cueing to focus on desired task. she ask questions throughout  session off topic and has poor insight/awareness of surroundings.  Transfers Overall transfer level: Needs assistance Equipment used: 4-wheeled walker Transfers: Sit to/from Stand Sit to Stand: From elevated surface;Min assist;Mod assist         General transfer comment: pt performed STS 4 x EOB prior to ambulating in room. She has slight posterior push but with cues + min-mod assist is able to stand. She was on 5 L o2 [place] throughout sessiopn with sao2 > 90 %  Ambulation/Gait Ambulation/Gait assistance: Min assist Gait Distance (Feet): 20 Feet Assistive device: Rolling walker (2 wheeled) Gait Pattern/deviations: Step-through pattern;Trunk flexed Gait velocity: decreased   General Gait Details: Pt was able to ambulate ~ 20 ft in room with 5 L O2. does endorse fatigue with slightly elevated HR/RR however overall tolerated well.        Balance Overall balance assessment: Needs assistance Sitting-balance support: Feet supported Sitting balance-Leahy Scale: Fair Sitting balance - Comments: Pt was able to maintain sitting balance EOB with BUE/LE support. no physical assistance required to maintain sitting balance. close SBA 2/2 to cognition/ impulsivity   Standing balance support: Bilateral upper extremity supported;During functional activity Standing balance-Leahy Scale: Fair Standing balance comment: Pt is at high fall risk however due to poor safety awareness moreso than balance deficits         Cognition Arousal/Alertness: Awake/alert Behavior During Therapy: Impulsive Overall Cognitive Status: History of cognitive impairments - at baseline          General Comments: Pt has history of  cognition deficits at baseline however does follow commands consistently with increased time to process and repeating desired task. She is slightly impulsive at times but overall cooperative and pleasant.             Pertinent Vitals/Pain Pain Assessment: No/denies pain Faces Pain  Scale: No hurt           PT Goals (current goals can now be found in the care plan section) Acute Rehab PT Goals Patient Stated Goal: none stated Progress towards PT goals: Progressing toward goals    Frequency    Min 2X/week      PT Plan Current plan remains appropriate       AM-PAC PT "6 Clicks" Mobility   Outcome Measure  Help needed turning from your back to your side while in a flat bed without using bedrails?: A Little Help needed moving from lying on your back to sitting on the side of a flat bed without using bedrails?: A Lot Help needed moving to and from a bed to a chair (including a wheelchair)?: A Lot Help needed standing up from a chair using your arms (e.g., wheelchair or bedside chair)?: A Lot Help needed to walk in hospital room?: A Lot Help needed climbing 3-5 steps with a railing? : A Lot 6 Click Score: 13    End of Session Equipment Utilized During Treatment: Oxygen (5 L throughout session ) Activity Tolerance: Patient tolerated treatment well;No increased pain Patient left: in chair;with call bell/phone within reach;with chair alarm set;with nursing/sitter in room Nurse Communication: Mobility status PT Visit Diagnosis: Difficulty in walking, not elsewhere classified (R26.2);Unsteadiness on feet (R26.81);Other abnormalities of gait and mobility (R26.89);Muscle weakness (generalized) (M62.81)     Time: 4709-6283 PT Time Calculation (min) (ACUTE ONLY): 28 min  Charges:  $Gait Training: 8-22 mins $Therapeutic Activity: 8-22 mins                     Julaine Fusi PTA 12/06/19, 2:56 PM

## 2019-12-06 NOTE — Progress Notes (Signed)
Pt Alert to self and place, disoriented to situation and forgetful. Pt pleasant and cooperative. VS stable, Remains on 2L 02 via [place] No complaints. Pt with good appetite today eating majority of all meals and snacks. BG elevated given insulin coverage per order. meds crushed in applesauce. Aspiration and falls precautions maintained. Pt in low bed and floor mat present. Daughter joyce update on POC.

## 2019-12-06 NOTE — Progress Notes (Signed)
PT capillary blood glucose is 68. 15G of carbs are given orally. Will monitor and update glucose levels. Pt remains alert and baseline orientation is unchanged.

## 2019-12-06 NOTE — Progress Notes (Signed)
PROGRESS NOTE    CONTINA STRAIN  KVQ:[id number] DOB: Jul 30, 1960 DOA: 11/22/2019 PCP: [place] Lank, MD   Brief Narrative: Taken from H&P. The patient is a 59 yr old cognitively impaired woman who lives in a group home. She was diagnosed with COVID 19 on 11/10/2019.  past medical history significant for CHF (EF 30-35% from echo 09/27/2019), CAD (Hx MI), DM II, Hypertension, Hyperlipidemia, Paranoid Schizophrenia, Thyroid disease. Admitted for COVID-19 pneumonia, UTI with altered mental status.  Requiring up to 9 L with HFNC, uses 2 L at home. Completed the treatment with remdesivir and UTI. Developed hypernatremia and hypercalcemia, started improving.  Subjective: Pt denied pain.  Was trying to drink her thickened liquid, and started eating her sherbet.   Said she wanted to go home.   Assessment & Plan:   Principal Problem:   COVID-19 Active Problems:   Chronic systolic heart failure (HCC)   CAD in native artery   Controlled type 2 diabetes mellitus without complication (HCC)   Benign essential HTN   Altered mental state   Paranoid schizophrenia ([place])   Type 2 diabetes mellitus with hyperlipidemia (HCC)   Hypothyroidism   Acute respiratory failure with hypoxia (HCC)   Acute kidney injury superimposed on CKD (HCC)   Weakness   Hypercalcemia   Tachycardia   Goals of care, counseling/discussion   Palliative care by specialist   DNR (do not resuscitate) discussion   Malnutrition of moderate degree  Acute hypoxic respiratory failure secondary to COVID-19 pneumonia.   Needed 9L initially.  Patient is at baseline for her oxygen requirement now.  Saturating well on 2 L.  Completed the treatment for remdesivir.  Did receive baricitinib which was discontinued due to worsening leukocytosis.  Completed a course of Zithromax and cefepime.  Elevated inflammatory markers which are now improving.  Elevated procalcitonin at 1.34 initially. MRSA nasal swab was negative. PLAN: -d/c solumedrol  today (already had >10 days)  Fall.  Patient had a fall from bed to floor.  No significant injuries noted.  Not sure about whether she hit her head or not. --CT head neg. --Fall precaution.   --PT rec SNF rehab  Acute metabolic encephalopathy.   Some improvement in mentation . Able to follow command.  May be at baseline.  Found to have hypercalcimia and worsening hypernatremia which can contribute to encephalopathy.  Patient also has underlying psych illness and on multiple psych meds.  -Blood cultures remain negative with normal lactic acid. -Keep holding trazodone, lithium and Neurontin. -Repeat swallow evaluation rec Dysphagia 1 (puree);Nectar-thick liquid  Hyperparathyroidism/ hypercalcemia.  Calcium continue to improve.  hypercalcemia labs with mildly decreased vitamin D and elevated parathyroid.  Protein electrophoresis within normal limits. Patient also has CKD stage IIIb. -Complete the course of Miacalcin. PTH related peptide <2.0. -Continue p.o. hydration.  Hypernatremia.  Resolved.   --s/p D5W infusion and free water via tube feed.  Lithium was also discontinued as that can also contribute to hypernatremia.  --encourage oral hydration --monitor Na  Nursing concern of aspiration.  Most likely secondary to encephalopathy.  NG tube was placed for few days and she was started on nutrition along with free water to help with hyponatremia.  Patient appears little more alert.  NG tube removed on 9/16. --Repeat swallow evaluation rec Dysphagia 1 (puree);Nectar-thick liquid  HFrEF.  Patient has EF of 30 to 35% according to echo done in July 2021. -We will observe volume status as she needs IV fluid for worsening hypernatremia and hypercalcemia.  Diabetes  mellitus.  Elevated CBG, improved as compared to before. --continue Levemir 8u BID --ACHS and SSI  AKI  No hx of CKD --s/p IVF hydration.  Cr back to baseline now.  E. coli UTI.  Completed the course of antibiotics.  Acute  urinary retention.  Patient developed acute urinary retention with abdominal pain requiring Foley catheter. Foley was discontinued  and patient is able to void.  Schizophrenia.  Patient lives in a group home.  Lithium within therapeutic range on admission.  Lithium and trazodone were discontinued due to worsening hypernatremia and mental status. --psych consulted --continue Abilify and Remeron, per psych.  Neuropathy.  Her home dose of gabapentin is being held due to increased lethargy and altered mental status.  Can be restarted if patient started complaining about pain.  Hypothyroidism.  Due to TSH in hyperthyroid range her home dose of Synthroid was decreased during current hospitalization. -Continue Synthroid-patient will need a repeat TSH in 4 to 6 weeks.  Physical deconditioning/generalized weakness.   - PT and OT evaluation-recommending SNF placement.   Objective: Vitals:   12/06/19 0535 12/06/19 0834 12/06/19 1238 12/06/19 1300  BP: (!) 106/91 102/71 (!) 79/65 106/62  Pulse: 89 77 92 93  Resp: 18 20 (!) 21 14  Temp: 97.9 F (36.6 C) 97.7 F (36.5 C) 98.9 F (37.2 C)   TempSrc: Oral Oral Oral   SpO2: 98% 96% 91% 97%  Weight:      Height:        Intake/Output Summary (Last 24 hours) at 12/06/2019 1513 Last data filed at 12/06/2019 1300 Gross per 24 hour  Intake 860 ml  Output 800 ml  Net 60 ml   Filed Weights   12/04/19 0500 12/05/19 0450 12/06/19 0500  Weight: 82.3 kg 82.1 kg 96.2 kg    Examination:  Constitutional: NAD, alert, talking but not making much sense HEENT: conjunctivae and lids normal, EOMI CV: RRR no M,R,G. Distal pulses +2.  No cyanosis.   RESP: CTA B/L, normal respiratory effort, on RA GI: +BS, NTND Extremities: No effusions, edema in BLE SKIN: warm, dry and intact Neuro: II - XII grossly intact.     DVT prophylaxis: Lovenox SQ Code Status: Full code  Family Communication:  Status is: inpatient Dispo:   The patient is from: group  home Anticipated d/c is to: SNF Anticipated d/c date is: whenever bed available Patient currently is medically stable to d/c.    Consultants:   Palliative  Nephrology  Procedures:   Antimicrobials:   Cefepime  Data Reviewed: I have personally reviewed following labs and imaging studies  CBC: Recent Labs  Lab 12/01/19 0545 12/02/19 0508 12/03/19 0520 12/05/19 0502 12/06/19 0610  WBC 20.6* 20.0* 23.0* 20.0* 17.4*  HGB 12.6 13.0 14.0 12.1 12.1  HCT 41.1 42.7 45.7 40.8 40.3  MCV 87.4 86.3 85.7 87.4 86.9  PLT 311 339 396 346 510   Basic Metabolic Panel: Recent Labs  Lab 12/01/19 0545 12/01/19 1255 12/02/19 0508 12/02/19 0508 12/02/19 1103 12/03/19 0520 12/04/19 1122 12/05/19 0502 12/06/19 0610  NA 145   < > 147*   < > 146* 148* 138 138 143  K 4.3   < > 3.6   < > 4.0 3.5 4.3 4.0 4.2  CL 116*   < > 115*   < > 116* 114* 109 104 108  CO2 22   < > 25   < > 22 25 22 27 27   GLUCOSE 344*   < > 270*   < >  249* 138* 225* 293* 113*  BUN 56*   < > 54*   < > 53* 45* 41* 36* 33*  CREATININE 1.26*   < > 1.13*   < > 1.04* 1.14* 0.90 1.00 0.95  CALCIUM 10.9*   < > 10.8*   < > 10.4* 11.0* 9.8 9.7 10.1  MG 2.3  --  2.6*  --   --  2.7*  --  2.3 2.4  PHOS 3.1  --  2.7  --   --  2.4*  --  3.4 3.8   < > = values in this interval not displayed.   GFR: Estimated Creatinine Clearance: 70.4 mL/min (by C-G formula based on SCr of 0.95 mg/dL). Liver Function Tests: Recent Labs  Lab 12/02/19 0508 12/03/19 0520 12/04/19 1122 12/05/19 0502 12/06/19 0610  AST 13* 16 16 13* 16  ALT 9 13 14 13 14   ALKPHOS 59 65 58 61 63  BILITOT 0.8 0.8 0.5 0.5 0.7  PROT 5.6* 6.3* 5.4* 5.5* 5.8*  ALBUMIN 2.5* 2.8* 2.4* 2.5* 2.6*   No results for input(s): LIPASE, AMYLASE in the last 168 hours. No results for input(s): AMMONIA in the last 168 hours. Coagulation Profile: No results for input(s): INR, PROTIME in the last 168 hours. Cardiac Enzymes: No results for input(s): CKTOTAL, CKMB,  CKMBINDEX, TROPONINI in the last 168 hours. BNP (last 3 results) No results for input(s): PROBNP in the last 8760 hours. HbA1C: No results for input(s): HGBA1C in the last 72 hours. CBG: Recent Labs  Lab 12/06/19 0009 12/06/19 0534 12/06/19 0836 12/06/19 1241 12/06/19 1502  GLUCAP 110* 100* 93 351* 451*   Lipid Profile: No results for input(s): CHOL, HDL, LDLCALC, TRIG, CHOLHDL, LDLDIRECT in the last 72 hours. Thyroid Function Tests: No results for input(s): TSH, T4TOTAL, FREET4, T3FREE, THYROIDAB in the last 72 hours. Anemia Panel: No results for input(s): VITAMINB12, FOLATE, FERRITIN, TIBC, IRON, RETICCTPCT in the last 72 hours. Sepsis Labs: No results for input(s): PROCALCITON, LATICACIDVEN in the last 168 hours.  Recent Results (from the past 240 hour(s))  CULTURE, BLOOD (ROUTINE X 2) w Reflex to ID Panel     Status: None   Collection Time: 11/27/19  4:57 PM   Specimen: BLOOD  Result Value Ref Range Status   Specimen Description BLOOD RAC  Final   Special Requests   Final    BOTTLES DRAWN AEROBIC AND ANAEROBIC Blood Culture adequate volume   Culture   Final    NO GROWTH 5 DAYS Performed at [facility], [address] [place], [place] [address]    Report Status 12/02/2019 FINAL  Final  CULTURE, BLOOD (ROUTINE X 2) w Reflex to ID Panel     Status: None   Collection Time: 11/27/19  5:01 PM   Specimen: BLOOD  Result Value Ref Range Status   Specimen Description BLOOD [facility]  Final   Special Requests   Final    BOTTLES DRAWN AEROBIC AND ANAEROBIC Blood Culture adequate volume   Culture   Final    NO GROWTH 5 DAYS Performed at [facility], [address] [place], [place] [address]    Report Status 12/02/2019 FINAL  Final  MRSA PCR Screening     Status: None   Collection Time: 11/29/19 10:28 AM   Specimen: Nasopharyngeal  Result Value Ref Range Status   MRSA by PCR NEGATIVE NEGATIVE Final    Comment:        The GeneXpert MRSA Assay  (FDA approved for  NASAL specimens only), is one component of a comprehensive MRSA colonization surveillance program. It is not intended to diagnose MRSA infection nor to guide or monitor treatment for MRSA infections. Performed at [facility], [address] [place], [place] [address]      Radiology Studies: CT HEAD WO CONTRAST  Result Date: 12/05/2019 CLINICAL DATA:  59 year old female status post fall. Encephalopathy. Diagnosed with COVID-19. Last month. EXAM: CT HEAD WITHOUT CONTRAST TECHNIQUE: Contiguous axial images were obtained from the base of the skull through the vertex without intravenous contrast. COMPARISON:  Brain MRI 09/19/2019, head CT 11/27/2019. FINDINGS: Brain: Cerebral volume is stable and within normal limits. No midline shift, ventriculomegaly, mass effect, evidence of mass lesion, intracranial hemorrhage or evidence of cortically based acute infarction. Gray-white matter differentiation is largely normal throughout the brain, minimal white matter hypodensity. No cortical encephalomalacia identified. Vascular: Calcified atherosclerosis at the skull base. No suspicious intracranial vascular hyperdensity. Skull: No acute osseous abnormality identified. Hyperostosis, normal variant. Sinuses/Orbits: Visualized paranasal sinuses and mastoids are stable and well pneumatized. Other: No orbit or scalp soft tissue injury identified. IMPRESSION: Stable and negative noncontrast CT appearance of the brain. No acute traumatic injury identified. Electronically Signed   By: Genevie Ann M.D.   On: 12/05/2019 15:49    Scheduled Meds: . albuterol  2 puff Inhalation Q6H  . ARIPiprazole  30 mg Per Tube QHS  . vitamin C  500 mg Per Tube Daily  . aspirin  81 mg Per Tube Daily  . benztropine  0.5 mg Per Tube BID  . carvedilol  3.125 mg Per Tube BID WC  . cholecalciferol  1,000 Units Per Tube Daily  . enoxaparin (LOVENOX) injection  40 mg Subcutaneous Q24H  . feeding supplement  (NEPRO CARB STEADY)  237 mL Oral TID BM  . fluticasone  2 spray Each Nare Daily  . insulin aspart  0-20 Units Subcutaneous TID WC  . insulin aspart  0-5 Units Subcutaneous QHS  . insulin detemir  8 Units Subcutaneous BID  . levothyroxine  25 mcg Per Tube QAC breakfast  . liver oil-zinc oxide   Topical TID  . mouth rinse  15 mL Mouth Rinse BID  . mirtazapine  15 mg Oral QHS  . pantoprazole (PROTONIX) IV  40 mg Intravenous QHS  . polyethylene glycol  17 g Per Tube Daily  . senna  1 tablet Per Tube QHS  . sertraline  100 mg Per Tube q AM  . zinc sulfate  220 mg Per Tube Daily   Continuous Infusions:    LOS: 14 days    Enzo Bi, MD [facility]  If 7PM-7AM, please contact night-coverage [email or web address]  12/06/2019, 3:13 PM

## 2019-12-06 NOTE — Progress Notes (Signed)
Pt blood pressure reading is currently 84/68. Pt is asymptomatic and verbalizes no discomforts.Increase fluid intake is encouraged. Blood pressure has been trending at this value with no symptomatic discomforts that causes medical interventions.

## 2019-12-07 LAB — CBC
HCT: 39.8 % (ref 36.0–46.0)
Hemoglobin: 12.1 g/dL (ref 12.0–15.0)
MCH: 25.7 pg — ABNORMAL LOW (ref 26.0–34.0)
MCHC: 30.4 g/dL (ref 30.0–36.0)
MCV: 84.7 fL (ref 80.0–100.0)
Platelets: 377 10*3/uL (ref 150–400)
RBC: 4.7 MIL/uL (ref 3.87–5.11)
RDW: 14.9 % (ref 11.5–15.5)
WBC: 13.6 10*3/uL — ABNORMAL HIGH (ref 4.0–10.5)
nRBC: 0 % (ref 0.0–0.2)

## 2019-12-07 LAB — BASIC METABOLIC PANEL
Anion gap: 10 (ref 5–15)
BUN: 38 mg/dL — ABNORMAL HIGH (ref 6–20)
CO2: 25 mmol/L (ref 22–32)
Calcium: 9.8 mg/dL (ref 8.9–10.3)
Chloride: 107 mmol/L (ref 98–111)
Creatinine, Ser: 1.09 mg/dL — ABNORMAL HIGH (ref 0.44–1.00)
GFR calc Af Amer: >60 mL/min (ref >60)
GFR calc non Af Amer: 56 mL/min — ABNORMAL LOW (ref >60)
Glucose, Bld: 112 mg/dL — ABNORMAL HIGH (ref 70–99)
Potassium: 3.5 mmol/L (ref 3.5–5.1)
Sodium: 142 mmol/L (ref 135–145)

## 2019-12-07 LAB — MAGNESIUM: Magnesium: 2.5 mg/dL — ABNORMAL HIGH (ref 1.7–2.4)

## 2019-12-07 LAB — GLUCOSE, CAPILLARY
Glucose-Capillary: 88 mg/dL (ref 70–99)
Glucose-Capillary: 86 mg/dL (ref 70–99)
Glucose-Capillary: 311 mg/dL — ABNORMAL HIGH (ref 70–99)
Glucose-Capillary: 104 mg/dL — ABNORMAL HIGH (ref 70–99)

## 2019-12-07 MED ORDER — ASCORBIC ACID 500 MG PO TABS: 500.0000 mg | ORAL_TABLET | Freq: Every day | ORAL | 0 refills | Status: AC

## 2019-12-07 MED ORDER — ZINC SULFATE 220 (50 ZN) MG PO CAPS: 220.0000 mg | ORAL_CAPSULE | Freq: Every day | ORAL | 0 refills | Status: AC

## 2019-12-07 MED ORDER — ZINC OXIDE 40 % EX OINT: TOPICAL_OINTMENT | CUTANEOUS | 0 refills | Status: DC | PRN

## 2019-12-07 MED ORDER — LEVOTHYROXINE SODIUM 25 MCG PO TABS: 25.0000 ug | ORAL_TABLET | Freq: Every day | ORAL | Status: AC

## 2019-12-07 MED ORDER — NYSTATIN 100000 UNIT/GM EX CREA: 1.0000 "application " | TOPICAL_CREAM | Freq: Two times a day (BID) | CUTANEOUS | 0 refills | Status: AC

## 2019-12-07 NOTE — Evaluation (Signed)
Clinical/Bedside Swallow Evaluation Patient Details  Name: Elizabeth Gutierrez MRN: [id number] Date of Birth: [birth year]  Today's Date: 12/07/2019 Time: SLP Start Time (ACUTE ONLY): 58 SLP Stop Time (ACUTE ONLY): 1029 SLP Time Calculation (min) (ACUTE ONLY): 22 min  Past Medical History:  Past Medical History:  Diagnosis Date  . CHF (congestive heart failure) ([place])   . Coronary artery disease   . Diabetes ([place])   . Heart attack ([place])   . Hyperlipidemia   . Hypertension   . MR (mental retardation)    Mild  . Paranoid schizophrenia ([place])   . Thyroid disease    Past Surgical History:  Past Surgical History:  Procedure Laterality Date  . FOOT SURGERY Right    HPI:  Elizabeth Gutierrez is a 59 yr old cognitively impaired (MR) woman who lives in a group home. She was diagnosed with COVID 19 on 11/10/2019. At admit, she was found to have altered mental status. She was saturating 77% on room air.  She has a past medical history significant for CHF (EF 30-35% from echo 09/27/2019), CAD (Hx MI), DM II, Hypertension, Hyperlipidemia, Paranoid Schizophrenia, Thyroid disease.   Assessment / Plan / Recommendation Clinical Impression  Elizabeth Gutierrez's mentation is much improved over previous dysphagia assessment. Elizabeth Gutierrez requesting water and [facility]. During this assessment, Elizabeth Gutierrez demonstrated appropriate oropharyngeal abilities when consuming regular textures and thin liquids via straw. Elizabeth Gutierrez has shown beautiful progress and is appropriate for diet upgraded to regular with thin liquids and medicine whole in puree. ST intervention is not indicated.    SLP Visit Diagnosis: Dysphagia, oropharyngeal phase (R13.12)    Aspiration Risk  No limitations    Diet Recommendation Regular;Thin liquid   Liquid Administration via: Cup;Straw Medication Administration: Whole meds with puree Supervision: Patient able to self feed Compensations: Minimize environmental distractions;Slow rate;Small sips/bites Postural Changes: Seated upright at 90  degrees    Other  Recommendations Oral Care Recommendations: Oral care BID   Follow up Recommendations Skilled Nursing facility      Frequency and Duration   N/A         Prognosis   N/A     Swallow Study   General Date of Onset: 12/06/19 HPI: Elizabeth Gutierrez is a 59 yr old cognitively impaired (MR) woman who lives in a group home. She was diagnosed with COVID 19 on 11/10/2019. At admit, she was found to have altered mental status. She was saturating 77% on room air.  She has a past medical history significant for CHF (EF 30-35% from echo 09/27/2019), CAD (Hx MI), DM II, Hypertension, Hyperlipidemia, Paranoid Schizophrenia, Thyroid disease.  Elizabeth Gutierrez is being followed by Palliative Care services; NGT for nutrition currently. Type of Study: Bedside Swallow Evaluation Previous Swallow Assessment: BSE recommended puree with nectar thick liquids Diet Prior to this Study: Dysphagia 1 (puree);Nectar-thick liquids Temperature Spikes Noted: No Respiratory Status: Room air History of Recent Intubation: No Behavior/Cognition: Alert;Cooperative;Pleasant mood Oral Cavity Assessment: Within Functional Limits Oral Care Completed by SLP: No Oral Cavity - Dentition: Missing dentition Vision: Functional for self-feeding Self-Feeding Abilities: Able to feed self Patient Positioning: Upright in bed Baseline Vocal Quality: Normal Volitional Cough: Strong Volitional Swallow: Able to elicit    Oral/Motor/Sensory Function Overall Oral Motor/Sensory Function: Within functional limits   Ice Chips Ice chips: Not tested   Thin Liquid Thin Liquid: Within functional limits Presentation: Straw    Nectar Thick Nectar Thick Liquid: Not tested   Honey Thick Honey Thick Liquid: Not tested   Puree Puree: Within functional limits  Solid     Solid: Within functional limits      Estevan Kersh 12/07/2019,11:54 AM

## 2019-12-07 NOTE — Discharge Instructions (Signed)

## 2019-12-07 NOTE — Discharge Summary (Signed)
Physician Discharge Summary   Elizabeth Gutierrez  female DOB: 1960-07-23  AJG:[id number]  PCP: Denton Lank, MD  Admit date: 11/22/2019 Discharge date: 12/07/2019  Admitted From: group home Disposition:  SNF CODE STATUS: Full code Palliative care was consulted.  Confirmed pt remains Full Code.  Hospital Course:  For full details, please see H&P, progress notes, consult notes and ancillary notes.  Briefly,  Elizabeth Gutierrez is a 59 yr old cognitively impaired woman who lives in a group home. She was diagnosed with COVID 19 on 11/10/2019.  past medical history significant for CHF (EF 30-35% from echo 09/27/2019), CAD (Hx MI), DM II, Hypertension, Hyperlipidemia, Paranoid Schizophrenia, Thyroid disease. Admitted for COVID-19 pneumonia, UTI with altered mental status.  Requiring up to 9 L with HFNC, uses 2 L at home.  Acute hypoxic respiratory failure secondary to COVID-19 pneumonia.   Needed 9L initially but has since been weaned down to baseline 2L.  Pt completed a course of Remdesivir and steroid with solumedrol.  Pt was started on baricitinib but then discontinued due to worsening leukocytosis.  Elevated procalcitonin at 1.34 initially. MRSA nasal swab was negative.  Pt completed a course of Zithromax and cefepime.  Pt had elevated inflammatory markers which improved with steroid.  Fall Patient had a fall from bed to floor.  No significant injuries noted.  Not sure about whether she hit her head or not.  CT head neg. Fall precaution.    Acute metabolic encephalopathy, improved Found to have hypercalcimia and worsening hypernatremia which could contribute to encephalopathy.  Patient also has underlying psych illness and on multiple psych meds.  Lithium, gabapentin and trazodone were held during hospitalization.  Prior to discharge, pt's mental status improved, was alert and able to follow commands and able to feed self.  All home psych meds resumed at  discharge.  Hyperparathyroidism Hypercalcemia, resolved Calcium peaked at 12.1 but returned to normal range prior to discharge.  hypercalcemia labs with mildly decreased vitamin D and elevated parathyroid.  Protein electrophoresis within normal limits.  Complete the course of Miacalcin. PTH related peptide <2.0.  Hypernatremia, Resolved.   Maybe due to poor oral hydration.  Pt received D5W infusion and then free water via tube feed.  Lithium was also discontinued as that can also contribute to hypernatremia.  Tube feed was d/c'ed 2 days prior to discharge, and pt was eating drinking well on her own, with stable normal Na level.  Nursing concern of aspiration, resolved Most likely secondary to encephalopathy.  NG tube was placed for few days and pt was started on nutrition along with free water to help with hypernatremia.  NG tube removed on 9/16. On the day of discharge, SLP re-evaluated the pt and cleared pt for regular diet, thin liquids via straw and medicine whole with puree.  Chronic systolic CHF Patient has EF of 30 to 35% according to echo done in July 2021.  Stable and not in exacerbation.  Diabetes mellitus Hyperglycemia due to steroid use. A1c 7.6.  Pt received Levemir 8u BID and SSI during hospitalization for elevated BG from high-dose steroid.  Pt was discharged back to home diabetic regimen.  AKI  No hx of CKD Cr peaked at 2.94, likely due to acute illness and dehydration.  Cr improved with IVF hydration.  Cr 1.09 on the day of discharge.  E. coli UTI.   Completed the course of antibiotics (received cefepime that was also for empiric PNA tx)  Acute urinary retention.   Patient  developed acute urinary retention with abdominal pain requiring Foley catheter. Foley was discontinued and patient is able to void.  Schizophrenia.   Patient lives in a group home.  Lithium within therapeutic range on admission.  Lithium and trazodone were held due to worsening  hypernatremia and mental status.  Psych consulted.  Pt continued on Abilify and Remeron added, per psych.  Hypernatremia and mental status back to normal prior to discharge, so pt was discharged back on home regimen.  Neuropathy.   Her home dose of gabapentin was held due to increased lethargy and altered mental status.  Resumed at discharge.  Hx of Hypothyroidism.   TSH low at 0.119, so home Synthroid was decreased to 25 mcg (down from 75 mcg) during hospitalization.  Pt will need a repeat TSH in 3 months.  Physical deconditioning generalized weakness.   PT and OT evaluation-recommending SNF placement.   Discharge Diagnoses:  Principal Problem:   COVID-19 Active Problems:   Chronic systolic heart failure (HCC)   CAD in native artery   Controlled type 2 diabetes mellitus without complication (HCC)   Benign essential HTN   Altered mental state   Paranoid schizophrenia ([place])   Type 2 diabetes mellitus with hyperlipidemia (HCC)   Hypothyroidism   Acute respiratory failure with hypoxia (HCC)   Acute kidney injury superimposed on CKD (HCC)   Weakness   Hypercalcemia   Tachycardia   Goals of care, counseling/discussion   Palliative care by specialist   DNR (do not resuscitate) discussion   Malnutrition of moderate degree    Discharge Instructions:  Allergies as of 12/07/2019   No Known Allergies     Medication List    STOP taking these medications   naproxen 500 MG tablet Commonly known as: NAPROSYN     TAKE these medications   acetaminophen 325 MG tablet Commonly known as: TYLENOL Take 650 mg by mouth every 4 (four) hours as needed for moderate pain, fever or headache.   ARIPiprazole 30 MG tablet Commonly known as: ABILIFY Take 1 tablet (30 mg total) by mouth at bedtime.   ascorbic acid 500 MG tablet Commonly known as: VITAMIN C Take 1 tablet (500 mg total) by mouth daily. Start taking on: December 08, 2019   aspirin 81 MG tablet Take 81 mg by mouth  daily.   atorvastatin 20 MG tablet Commonly known as: LIPITOR Take 20 mg by mouth at bedtime.   benztropine 0.5 MG tablet Commonly known as: COGENTIN Take 0.5 mg by mouth 2 (two) times daily.   carvedilol 3.125 MG tablet Commonly known as: COREG Take 3.125 mg by mouth 2 (two) times daily.   Diabetic Tussin DM 10-100 MG/5ML liquid Generic drug: dextromethorphan-guaiFENesin Take by mouth every 4 (four) hours as needed for cough.   docusate sodium 100 MG capsule Commonly known as: COLACE Take 100 mg by mouth 2 (two) times daily as needed for mild constipation.   fluPHENAZine decanoate 25 MG/ML injection Commonly known as: PROLIXIN Inject 31.25 mg into the muscle every 14 (fourteen) days.   fluticasone 50 MCG/ACT nasal spray Commonly known as: FLONASE Place 2 sprays into both nostrils daily.   furosemide 20 MG tablet Commonly known as: Lasix Take 1 tablet (20 mg total) by mouth daily as needed for fluid or edema.   gabapentin 100 MG capsule Commonly known as: NEURONTIN Take 200 mg by mouth 3 (three) times daily.   levothyroxine 25 MCG tablet Commonly known as: SYNTHROID Take 1 tablet (25 mcg total) by mouth  daily before breakfast. What changed:   medication strength  how much to take   lithium carbonate 300 MG CR tablet Commonly known as: LITHOBID Take 300 mg by mouth every 12 (twelve) hours.   liver oil-zinc oxide 40 % ointment Commonly known as: DESITIN Apply topically as needed for irritation.   metFORMIN 500 MG 24 hr tablet Commonly known as: GLUCOPHAGE-XR Take 1,000 mg by mouth daily with breakfast.   MiraLax 17 g packet Generic drug: polyethylene glycol Take 17 g by mouth daily.   nystatin cream Commonly known as: MYCOSTATIN Apply 1 application topically 2 (two) times daily. What changed: when to take this   omeprazole 20 MG capsule Commonly known as: PRILOSEC Take 20 mg by mouth 2 (two) times daily.   senna 8.6 MG tablet Commonly known as:  SENOKOT Take 1 tablet by mouth at bedtime.   sertraline 100 MG tablet Commonly known as: ZOLOFT Take 100 mg by mouth in the morning.   traZODone 50 MG tablet Commonly known as: DESYREL Take 1 tablet (50 mg total) by mouth at bedtime.   zinc sulfate 220 (50 Zn) MG capsule Take 1 capsule (220 mg total) by mouth daily. Start taking on: December 08, 2019        Follow-up Information    Denton Lank, MD. Schedule an appointment as soon as possible for a visit in 1 week(s).   Specialty: Family Medicine Contact information: [address] [address] [phone]               No Known Allergies   The results of significant diagnostics from this hospitalization (including imaging, microbiology, ancillary and laboratory) are listed below for reference.   Consultations:   Procedures/Studies: CT HEAD WO CONTRAST  Result Date: 12/05/2019 CLINICAL DATA:  59 year old female status post fall. Encephalopathy. Diagnosed with COVID-19. Last month. EXAM: CT HEAD WITHOUT CONTRAST TECHNIQUE: Contiguous axial images were obtained from the base of the skull through the vertex without intravenous contrast. COMPARISON:  Brain MRI 09/19/2019, head CT 11/27/2019. FINDINGS: Brain: Cerebral volume is stable and within normal limits. No midline shift, ventriculomegaly, mass effect, evidence of mass lesion, intracranial hemorrhage or evidence of cortically based acute infarction. Gray-white matter differentiation is largely normal throughout the brain, minimal white matter hypodensity. No cortical encephalomalacia identified. Vascular: Calcified atherosclerosis at the skull base. No suspicious intracranial vascular hyperdensity. Skull: No acute osseous abnormality identified. Hyperostosis, normal variant. Sinuses/Orbits: Visualized paranasal sinuses and mastoids are stable and well pneumatized. Other: No orbit or scalp soft tissue injury identified. IMPRESSION: Stable and  negative noncontrast CT appearance of the brain. No acute traumatic injury identified. Electronically Signed   By: Genevie Ann M.D.   On: 12/05/2019 15:49   CT HEAD WO CONTRAST  Result Date: 11/27/2019 CLINICAL DATA:  Admitted 5 days ago for COVID-19 and altered level of consciousness, worsening mental status EXAM: CT HEAD WITHOUT CONTRAST TECHNIQUE: Contiguous axial images were obtained from the base of the skull through the vertex without intravenous contrast. COMPARISON:  11/22/2019 FINDINGS: Brain: No acute infarct or hemorrhage. Lateral ventricles and midline structures are unremarkable. No acute extra-axial fluid collections. No mass effect. Vascular: No hyperdense vessel or unexpected calcification. Skull: Normal. Negative for fracture or focal lesion. Sinuses/Orbits: No acute finding. Other: None. IMPRESSION: 1. Stable head CT, no acute process. Electronically Signed   By: Randa Ngo M.D.   On: 11/27/2019 15:35   CT Head Wo Contrast  Result Date: 11/22/2019 CLINICAL DATA:  Altered  mental status.  Hypoxia. EXAM: CT HEAD WITHOUT CONTRAST TECHNIQUE: Contiguous axial images were obtained from the base of the skull through the vertex without intravenous contrast. COMPARISON:  09/19/2019 FINDINGS: Brain: Periventricular white matter and corona radiata hypodensities favor chronic ischemic microvascular white matter disease. Otherwise, the brainstem, cerebellum, cerebral peduncles, thalamus, basal ganglia, basilar cisterns, and ventricular system appear within normal limits. No intracranial hemorrhage, mass lesion, or acute CVA. Vascular: There is atherosclerotic calcification of the cavernous carotid arteries bilaterally. Skull: Unremarkable Sinuses/Orbits: Chronic ethmoid, frontal, and left maxillary sinusitis. Other: No supplemental non-categorized findings. IMPRESSION: 1. No acute intracranial findings. 2. Periventricular white matter and corona radiata hypodensities favor chronic ischemic microvascular  white matter disease. 3. Chronic paranasal sinusitis. Electronically Signed   By: Van Clines M.D.   On: 11/22/2019 20:21   DG Chest Port 1 View  Result Date: 11/28/2019 CLINICAL DATA:  Enteric catheter placement EXAM: PORTABLE CHEST 1 VIEW COMPARISON:  11/28/2019 at 2:44 p.m. FINDINGS: Single frontal view of the chest demonstrates enteric catheter weighted tip projecting over the gastric fundus. Cardiac silhouette is unremarkable. Multifocal bilateral airspace disease greatest at the left lung base unchanged. No effusion or pneumothorax. IMPRESSION: 1. Enteric catheter tip projects over gastric fundus. 2. Stable multifocal airspace disease compatible with COVID-19 pneumonia. Electronically Signed   By: Randa Ngo M.D.   On: 11/28/2019 16:54   DG Chest Port 1 View  Result Date: 11/28/2019 CLINICAL DATA:  Check nasogastric catheter placement EXAM: PORTABLE CHEST 1 VIEW COMPARISON:  11/26/2019 FINDINGS: Weighted feeding tube is noted within the stomach. Cardiac shadow is stable. Patchy opacities are again identified bilaterally. IMPRESSION: Weighted feeding tube within the stomach. Otherwise stable appearing chest. Electronically Signed   By: Inez Catalina M.D.   On: 11/28/2019 15:04   DG Chest Port 1 View  Result Date: 11/26/2019 CLINICAL DATA:  COVID-19 positivity with hypoxia EXAM: PORTABLE CHEST 1 VIEW COMPARISON:  11/22/2019 FINDINGS: Cardiac shadow is mildly enlarged but stable. Increasing airspace opacity is noted throughout both lungs accentuated by poor inspiratory effort but likely representing progressive involvement in COVID-19 pneumonia. No sizable effusion is seen. No bony abnormality is noted. IMPRESSION: Changes consistent with the given clinical history of COVID-19 pneumonia which have increased in the interval from the prior exam. Portion of this may be accentuated by poor inspiratory effort. Electronically Signed   By: Inez Catalina M.D.   On: 11/26/2019 15:05   DG Chest Portable  1 View  Result Date: 11/22/2019 CLINICAL DATA:  COVID and poor EF, presenting hypoxic. eval infiltrate and pulm congestion EXAM: PORTABLE CHEST 1 VIEW COMPARISON:  Radiograph and CT 09/26/2019 FINDINGS: Cardiomegaly similar to prior exam. Patchy heterogeneous bilateral airspace opacities in a mid-lower lung zone predominant distribution. Mild vascular congestion. No septal thickening. No pneumothorax or large pleural effusion. IMPRESSION: 1. Patchy heterogeneous bilateral airspace opacities, typical of COVID-19 pneumonia. There is mild vascular congestion, the possibility of an element of pulmonary edema is not entirely excluded. 2. Chronic cardiomegaly. Electronically Signed   By: Keith Rake M.D.   On: 11/22/2019 19:44   DG Abd Portable 1V  Result Date: 11/29/2019 CLINICAL DATA:  Nasogastric tube placement EXAM: PORTABLE ABDOMEN - 1 VIEW COMPARISON:  None similar FINDINGS: The enteric tube tip and side-port overlaps the stomach. Gallstone seen over the right upper quadrant. Partially covered abdomen with normal bowel gas pattern. Streaky density at the left base that is similar to yesterday. Cardiomegaly. IMPRESSION: 1. Expected positioning of the nasogastric tube. 2. Cholelithiasis. Electronically  Signed   By: Monte Fantasia M.D.   On: 11/29/2019 10:57      Labs: BNP (last 3 results) Recent Labs    09/19/19 0236 09/26/19 1557 11/22/19 1945  BNP 75.4 552.6* 08.6   Basic Metabolic Panel: Recent Labs  Lab 12/01/19 0545 12/01/19 1255 12/02/19 0508 12/02/19 1103 12/03/19 0520 12/04/19 1122 12/05/19 0502 12/06/19 0610 12/07/19 0706  NA 145   < > 147*   < > 148* 138 138 143 142  K 4.3   < > 3.6   < > 3.5 4.3 4.0 4.2 3.5  CL 116*   < > 115*   < > 114* 109 104 108 107  CO2 22   < > 25   < > 25 22 27 27 25   GLUCOSE 344*   < > 270*   < > 138* 225* 293* 113* 112*  BUN 56*   < > 54*   < > 45* 41* 36* 33* 38*  CREATININE 1.26*   < > 1.13*   < > 1.14* 0.90 1.00 0.95 1.09*  CALCIUM  10.9*   < > 10.8*   < > 11.0* 9.8 9.7 10.1 9.8  MG 2.3  --  2.6*  --  2.7*  --  2.3 2.4 2.5*  PHOS 3.1  --  2.7  --  2.4*  --  3.4 3.8  --    < > = values in this interval not displayed.   Liver Function Tests: Recent Labs  Lab 12/02/19 0508 12/03/19 0520 12/04/19 1122 12/05/19 0502 12/06/19 0610  AST 13* 16 16 13* 16  ALT 9 13 14 13 14   ALKPHOS 59 65 58 61 63  BILITOT 0.8 0.8 0.5 0.5 0.7  PROT 5.6* 6.3* 5.4* 5.5* 5.8*  ALBUMIN 2.5* 2.8* 2.4* 2.5* 2.6*   No results for input(s): LIPASE, AMYLASE in the last 168 hours. No results for input(s): AMMONIA in the last 168 hours. CBC: Recent Labs  Lab 12/02/19 0508 12/03/19 0520 12/05/19 0502 12/06/19 0610 12/07/19 0706  WBC 20.0* 23.0* 20.0* 17.4* 13.6*  HGB 13.0 14.0 12.1 12.1 12.1  HCT 42.7 45.7 40.8 40.3 39.8  MCV 86.3 85.7 87.4 86.9 84.7  PLT 339 396 346 373 377   Cardiac Enzymes: No results for input(s): CKTOTAL, CKMB, CKMBINDEX, TROPONINI in the last 168 hours. BNP: Invalid input(s): POCBNP CBG: Recent Labs  Lab 12/06/19 1947 12/06/19 2014 12/06/19 2358 12/07/19 0542 12/07/19 0741  GLUCAP 68* 82 105* 88 86   D-Dimer No results for input(s): DDIMER in the last 72 hours. Hgb A1c No results for input(s): HGBA1C in the last 72 hours. Lipid Profile No results for input(s): CHOL, HDL, LDLCALC, TRIG, CHOLHDL, LDLDIRECT in the last 72 hours. Thyroid function studies No results for input(s): TSH, T4TOTAL, T3FREE, THYROIDAB in the last 72 hours.  Invalid input(s): FREET3 Anemia work up No results for input(s): VITAMINB12, FOLATE, FERRITIN, TIBC, IRON, RETICCTPCT in the last 72 hours. Urinalysis    Component Value Date/Time   COLORURINE YELLOW (A) 11/22/2019 2325   APPEARANCEUR CLOUDY (A) 11/22/2019 2325   APPEARANCEUR Clear 04/23/2014 1009   LABSPEC 1.009 11/22/2019 2325   LABSPEC 1.003 04/23/2014 1009   PHURINE 6.0 11/22/2019 2325   GLUCOSEU >=500 (A) 11/22/2019 2325   GLUCOSEU Negative 04/23/2014 1009    HGBUR MODERATE (A) 11/22/2019 2325   BILIRUBINUR NEGATIVE 11/22/2019 2325   BILIRUBINUR Negative 04/23/2014 1009   KETONESUR NEGATIVE 11/22/2019 2325   PROTEINUR 30 (A) 11/22/2019 2325  NITRITE POSITIVE (A) 11/22/2019 2325   LEUKOCYTESUR LARGE (A) 11/22/2019 2325   LEUKOCYTESUR Negative 04/23/2014 1009   Sepsis Labs Invalid input(s): PROCALCITONIN,  WBC,  LACTICIDVEN Microbiology Recent Results (from the past 240 hour(s))  CULTURE, BLOOD (ROUTINE X 2) w Reflex to ID Panel     Status: None   Collection Time: 11/27/19  4:57 PM   Specimen: BLOOD  Result Value Ref Range Status   Specimen Description BLOOD RAC  Final   Special Requests   Final    BOTTLES DRAWN AEROBIC AND ANAEROBIC Blood Culture adequate volume   Culture   Final    NO GROWTH 5 DAYS Performed at [facility], [address] [place], [place] [address]    Report Status 12/02/2019 FINAL  Final  CULTURE, BLOOD (ROUTINE X 2) w Reflex to ID Panel     Status: None   Collection Time: 11/27/19  5:01 PM   Specimen: BLOOD  Result Value Ref Range Status   Specimen Description BLOOD [facility]  Final   Special Requests   Final    BOTTLES DRAWN AEROBIC AND ANAEROBIC Blood Culture adequate volume   Culture   Final    NO GROWTH 5 DAYS Performed at [facility], [address] [place], [place] [address]    Report Status 12/02/2019 FINAL  Final  MRSA PCR Screening     Status: None   Collection Time: 11/29/19 10:28 AM   Specimen: Nasopharyngeal  Result Value Ref Range Status   MRSA by PCR NEGATIVE NEGATIVE Final    Comment:        The GeneXpert MRSA Assay (FDA approved for NASAL specimens only), is one component of a comprehensive MRSA colonization surveillance program. It is not intended to diagnose MRSA infection nor to guide or monitor treatment for MRSA infections. Performed at [facility], [address] [place], [place] [address]      Total time spend on discharging this patient,  including the last patient exam, discussing the hospital stay, instructions for ongoing care as it relates to all pertinent caregivers, as well as preparing the medical discharge records, prescriptions, and/or referrals as applicable, is 40 minutes.    Enzo Bi, MD  [facility] 12/07/2019, 10:20 AM  If 7PM-7AM, please contact night-coverage

## 2019-12-07 NOTE — TOC Transition Note (Addendum)
Transition of Care [facility]) - CM/SW Discharge Note   Patient Details  Name: Elizabeth Gutierrez MRN: [id number] Date of Birth: Jul 29, 1960  Transition of [facility] CM/SW Contact:  Elizabeth Gutierrez [place], [place] Phone Number: [phone] 12/07/2019, 10:41 AM   Clinical Narrative:    Patient to discharge today to [facility] rehab by non-emergent ambulance. Patient to be going to Room 807. Report to be called in to [place], [phone].  Phone call to patient's mother Elizabeth Gutierrez [phone] to inform her of patient's discharge to [facility], message left for a return call.  Phone call to patient's group home, spoke with Elizabeth Gutierrez [phone] to inform him of patient's discharge to [facility].  Gregory, LCSW Transitions of Care [phone]      Barriers to Discharge: Continued Medical Work up   Patient Goals and CMS Choice   CMS Medicare.gov Compare Post Acute Care list provided to:: Legal Guardian Choice offered to / list presented to : [place] / [place]  Discharge Placement                       Discharge Plan and Services   Discharge Planning Services: CM Consult Post Acute Care Choice: [place]: [facility]        Social Determinants of Health (SDOH) Interventions     Readmission Risk Interventions No flowsheet data found.

## 2019-12-07 NOTE — Progress Notes (Signed)
Pt Alert to self and place, disoriented to situation and forgetful. Pt pleasant and cooperative. VS stable BP trend low, on room air Spo2 94% and above. No complaints. Pt with good appetite today eating majority of all meals and snacks . BG elevated given insulin coverage per order. meds crushed in applesauce. Aspiration  and falls precautions maintained. Pt in low bed and floor mat present. Daughter joyce update on POC. Plan for pt to transfer to [facility] place rehab. Report called in to [place] supervisor about 1400. Pt awaiting transport

## 2019-12-08 NOTE — Progress Notes (Signed)
Pt has been successfully discharged. Pt is transported on a stretcher via EMS services. Pt denies any pain and did not endorse any discomforts. Pt is alert and remains at baseline orientation. Pt vitals are within defined limits.

## 2020-02-03 ENCOUNTER — Other Ambulatory Visit: Payer: Self-pay | Admitting: Family Medicine

## 2020-02-03 DIAGNOSIS — Z1231 Encounter for screening mammogram for malignant neoplasm of breast: Secondary | ICD-10-CM

## 2020-03-02 ENCOUNTER — Ambulatory Visit
Admission: RE | Admit: 2020-03-02 | Discharge: 2020-03-02 | Disposition: A | Discharge status: Home / Self Care | Payer: Medicare Other | Source: Ambulatory Visit | Attending: Family Medicine | Admitting: Family Medicine

## 2020-03-02 ENCOUNTER — Other Ambulatory Visit: Payer: Self-pay

## 2020-03-02 DIAGNOSIS — Z1231 Encounter for screening mammogram for malignant neoplasm of breast: Secondary | ICD-10-CM

## 2020-06-25 DIAGNOSIS — I509 Heart failure, unspecified: Secondary | ICD-10-CM | POA: Insufficient documentation

## 2020-06-30 ENCOUNTER — Emergency Department: Payer: Medicare Other

## 2020-06-30 ENCOUNTER — Emergency Department
Admission: EM | Admit: 2020-06-30 | Discharge: 2020-06-30 | Disposition: A | Discharge status: Home / Self Care | Payer: Medicare Other | Attending: Emergency Medicine | Admitting: Emergency Medicine

## 2020-06-30 ENCOUNTER — Other Ambulatory Visit: Payer: Self-pay

## 2020-06-30 DIAGNOSIS — E1122 Type 2 diabetes mellitus with diabetic chronic kidney disease: Secondary | ICD-10-CM | POA: Insufficient documentation

## 2020-06-30 DIAGNOSIS — I5022 Chronic systolic (congestive) heart failure: Secondary | ICD-10-CM | POA: Diagnosis not present

## 2020-06-30 DIAGNOSIS — E039 Hypothyroidism, unspecified: Secondary | ICD-10-CM | POA: Insufficient documentation

## 2020-06-30 DIAGNOSIS — I130 Hypertensive heart and chronic kidney disease with heart failure and stage 1 through stage 4 chronic kidney disease, or unspecified chronic kidney disease: Secondary | ICD-10-CM | POA: Insufficient documentation

## 2020-06-30 DIAGNOSIS — E1140 Type 2 diabetes mellitus with diabetic neuropathy, unspecified: Secondary | ICD-10-CM | POA: Insufficient documentation

## 2020-06-30 DIAGNOSIS — Z20822 Contact with and (suspected) exposure to covid-19: Secondary | ICD-10-CM | POA: Diagnosis not present

## 2020-06-30 DIAGNOSIS — Z7984 Long term (current) use of oral hypoglycemic drugs: Secondary | ICD-10-CM | POA: Insufficient documentation

## 2020-06-30 DIAGNOSIS — Z79899 Other long term (current) drug therapy: Secondary | ICD-10-CM | POA: Diagnosis not present

## 2020-06-30 DIAGNOSIS — I252 Old myocardial infarction: Secondary | ICD-10-CM | POA: Insufficient documentation

## 2020-06-30 DIAGNOSIS — Z87891 Personal history of nicotine dependence: Secondary | ICD-10-CM | POA: Diagnosis not present

## 2020-06-30 DIAGNOSIS — N1831 Chronic kidney disease, stage 3a: Secondary | ICD-10-CM | POA: Diagnosis not present

## 2020-06-30 DIAGNOSIS — R443 Hallucinations, unspecified: Secondary | ICD-10-CM | POA: Insufficient documentation

## 2020-06-30 DIAGNOSIS — Z7982 Long term (current) use of aspirin: Secondary | ICD-10-CM | POA: Insufficient documentation

## 2020-06-30 DIAGNOSIS — Z8616 Personal history of COVID-19: Secondary | ICD-10-CM | POA: Diagnosis not present

## 2020-06-30 DIAGNOSIS — R0902 Hypoxemia: Secondary | ICD-10-CM | POA: Diagnosis not present

## 2020-06-30 DIAGNOSIS — I2510 Atherosclerotic heart disease of native coronary artery without angina pectoris: Secondary | ICD-10-CM | POA: Diagnosis not present

## 2020-06-30 LAB — COMPREHENSIVE METABOLIC PANEL
ALT: 11 U/L (ref 0–44)
AST: 14 U/L — ABNORMAL LOW (ref 15–41)
Albumin: 3.8 g/dL (ref 3.5–5.0)
Alkaline Phosphatase: 111 U/L (ref 38–126)
Anion gap: 7 (ref 5–15)
BUN: 25 mg/dL — ABNORMAL HIGH (ref 6–20)
CO2: 24 mmol/L (ref 22–32)
Calcium: 9.8 mg/dL (ref 8.9–10.3)
Chloride: 106 mmol/L (ref 98–111)
Creatinine, Ser: 1.33 mg/dL — ABNORMAL HIGH (ref 0.44–1.00)
GFR, Estimated: 46 mL/min — ABNORMAL LOW (ref >60)
Glucose, Bld: 173 mg/dL — ABNORMAL HIGH (ref 70–99)
Potassium: 4 mmol/L (ref 3.5–5.1)
Sodium: 137 mmol/L (ref 135–145)
Total Bilirubin: 0.5 mg/dL (ref 0.3–1.2)
Total Protein: 7.2 g/dL (ref 6.5–8.1)

## 2020-06-30 LAB — CBC WITH DIFFERENTIAL/PLATELET
Abs Immature Granulocytes: 0.02 10*3/uL (ref 0.00–0.07)
Basophils Absolute: 0 10*3/uL (ref 0.0–0.1)
Basophils Relative: 1 %
Eosinophils Absolute: 0.1 10*3/uL (ref 0.0–0.5)
Eosinophils Relative: 1 %
HCT: 40.6 % (ref 36.0–46.0)
Hemoglobin: 12.1 g/dL (ref 12.0–15.0)
Immature Granulocytes: 0 %
Lymphocytes Relative: 20 %
Lymphs Abs: 1.5 10*3/uL (ref 0.7–4.0)
MCH: 25.3 pg — ABNORMAL LOW (ref 26.0–34.0)
MCHC: 29.8 g/dL — ABNORMAL LOW (ref 30.0–36.0)
MCV: 84.8 fL (ref 80.0–100.0)
Monocytes Absolute: 0.5 10*3/uL (ref 0.1–1.0)
Monocytes Relative: 7 %
Neutro Abs: 5.2 10*3/uL (ref 1.7–7.7)
Neutrophils Relative %: 71 %
Platelets: 302 10*3/uL (ref 150–400)
RBC: 4.79 MIL/uL (ref 3.87–5.11)
RDW: 14.7 % (ref 11.5–15.5)
WBC: 7.3 10*3/uL (ref 4.0–10.5)
nRBC: 0 % (ref 0.0–0.2)

## 2020-06-30 LAB — URINALYSIS, COMPLETE (UACMP) WITH MICROSCOPIC
Bacteria, UA: NONE SEEN
Bilirubin Urine: NEGATIVE
Glucose, UA: >=500 mg/dL — AB
Hgb urine dipstick: NEGATIVE
Ketones, ur: NEGATIVE mg/dL
Leukocytes,Ua: NEGATIVE
Nitrite: NEGATIVE
Protein, ur: NEGATIVE mg/dL
Specific Gravity, Urine: 1.002 — ABNORMAL LOW (ref 1.005–1.030)
pH: 6 (ref 5.0–8.0)

## 2020-06-30 LAB — TROPONIN I (HIGH SENSITIVITY)
Troponin I (High Sensitivity): 14 ng/L (ref <18)
Troponin I (High Sensitivity): 13 ng/L (ref <18)

## 2020-06-30 LAB — BRAIN NATRIURETIC PEPTIDE: B Natriuretic Peptide: 40.5 pg/mL (ref 0.0–100.0)

## 2020-06-30 LAB — SARS CORONAVIRUS 2 (TAT 6-24 HRS): SARS Coronavirus 2: NEGATIVE

## 2020-06-30 NOTE — ED Triage Notes (Signed)
Pt arrives to ER with caregiver who states pt is from group home for low oxygen sats. States was told they dropped to 88% overnight but caregiver states EMS was not called. States pt is coughing and c/o sore throat.

## 2020-06-30 NOTE — ED Notes (Signed)
Pt and caregived gave verbal understanding on dc, all questions answered

## 2020-06-30 NOTE — Discharge Instructions (Addendum)
The oxygen level has been normal throughout the stay in the ED.  There is no evidence of pneumonia, fluid in the lungs, or other respiratory problems.  CT scan of the head was normal.  Lab work-up shows slightly worsened kidney function (creatinine of 1.3) from before but no other significant findings.  You should talk to Elizabeth Gutierrez's primary care doctor about the recent medication changes and whether they could be contributing to her decreased sleep and symptoms at night.  Return to the ER for new, worsening, or persistent difficulty breathing, low oxygen level, cough, fever, confusion, change in mental status, persistent hallucinations, weakness, or any other new or worsening symptoms that are concerning.

## 2020-06-30 NOTE — ED Notes (Signed)
EDP at bedside  

## 2020-06-30 NOTE — ED Provider Notes (Signed)
[facility] Emergency Department Provider Note ____________________________________________   Event Date/Time   First MD Initiated Contact with Patient 06/30/20 1005     (approximate)  I have reviewed the triage vital signs and the nursing notes.   HISTORY  Chief Complaint No chief complaint on file.  Level 5 caveat: History of present illness limited due to cognitive impairment  HPI Elizabeth Gutierrez is a 60 y.o. female with PMH as noted below including cognitive impairment and schizophrenia as well as CHF, CAD, and diabetes who presents from her group home with possible altered mental status and hypoxia.  The caregiver from the group home reports that the patient has not been sleeping well in the last few days and has had some hallucinations during the night.  These have gone on for some time but seem to worsen recently.  She checked her vital signs several times yesterday evening and this morning.  O2 saturation last night was in the low 90s, and today dipped to 88% on room air.  The patient previously had been on oxygen chronically but was taken off of it in the last several months.  The patient herself has no significant acute complaints.  She initially stated that she felt sick, however she could not describe or qualify this further.  She denies shortness of breath, nausea, or any acute pain.   Past Medical History:  Diagnosis Date  . CHF (congestive heart failure) ([place])   . Coronary artery disease   . Diabetes ([place])   . Heart attack ([place])   . Hyperlipidemia   . Hypertension   . MR (mental retardation)    Mild  . Paranoid schizophrenia ([place])   . Thyroid disease     Patient Active Problem List   Diagnosis Date Noted  . Malnutrition of moderate degree 11/28/2019  . Goals of care, counseling/discussion   . Palliative care by specialist   . DNR (do not resuscitate) discussion   . Hypercalcemia   . Tachycardia   . Weakness   . Acute kidney injury  superimposed on CKD ([place])   . Acute cystitis with hematuria   . Acute respiratory failure with hypoxia ([place]) 11/22/2019  . COVID-19 11/22/2019  . Bilateral pneumonia 09/28/2019  . Shortness of breath 09/26/2019  . Hyperlipidemia   . Paranoid schizophrenia ([place])   . Type 2 diabetes mellitus with hyperlipidemia ([place])   . Hypothyroidism   . CKD (chronic kidney disease), stage IIIa   . Acute on chronic respiratory failure with hypoxia ([place])   . Acute metabolic encephalopathy 31/51/7616  . Altered mental state 08/17/2016  . DDD (degenerative disc disease), lumbar 07/09/2015  . Facet syndrome, lumbar 07/09/2015  . Lumbar radiculopathy 07/09/2015  . Sacroiliac joint dysfunction 07/09/2015  . Spinal stenosis, lumbar region, 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  . Diabetic neuropathy ([place]) 07/09/2015  . CAD in native artery 06/16/2015  . Controlled type 2 diabetes mellitus without complication ([place]) 07/37/1062  . Benign essential HTN 06/16/2015  . Obstructive apnea 01/05/2015  . Esophagitis, reflux 08/06/2014  . Edema leg 05/14/2014  . Chronic systolic heart failure ([place]) 05/14/2014  . TI (tricuspid incompetence) 05/14/2014  . Chest pain 12/13/2013  . Combined fat and carbohydrate induced hyperlipemia 10/09/2013  . Breath shortness 10/09/2013    Past Surgical History:  Procedure Laterality Date  . FOOT SURGERY Right     Prior to Admission medications   Medication Sig Start Date End Date Taking? Authorizing Provider  acetaminophen (TYLENOL) 325 MG tablet  Take 650 mg by mouth every 4 (four) hours as needed for moderate pain, fever or headache.     [provider]  ARIPiprazole (ABILIFY) 30 MG tablet Take 1 tablet (30 mg total) by mouth at bedtime. 09/21/19   Hongalgi, Lenis Dickinson, MD  aspirin 81 MG tablet Take 81 mg by mouth daily.    [provider]  atorvastatin (LIPITOR) 20 MG tablet Take 20 mg by mouth at bedtime.     [provider]  benztropine  (COGENTIN) 0.5 MG tablet Take 0.5 mg by mouth 2 (two) times daily.    [provider]  carvedilol (COREG) 3.125 MG tablet Take 3.125 mg by mouth 2 (two) times daily. 12/26/18   [provider]  dextromethorphan-guaiFENesin (DIABETIC TUSSIN DM) 10-100 MG/5ML liquid Take by mouth every 4 (four) hours as needed for cough.    [provider]  docusate sodium (COLACE) 100 MG capsule Take 100 mg by mouth 2 (two) times daily as needed for mild constipation.     [provider]  fluPHENAZine decanoate (PROLIXIN) 25 MG/ML injection Inject 31.25 mg into the muscle every 14 (fourteen) days.     [provider]  fluticasone (FLONASE) 50 MCG/ACT nasal spray Place 2 sprays into both nostrils daily.     [provider]  furosemide (LASIX) 20 MG tablet Take 1 tablet (20 mg total) by mouth daily as needed for fluid or edema. 10/01/19 09/30/20  Jennye Boroughs, MD  gabapentin (NEURONTIN) 100 MG capsule Take 200 mg by mouth 3 (three) times daily.    [provider]  levothyroxine (SYNTHROID) 25 MCG tablet Take 1 tablet (25 mcg total) by mouth daily before breakfast. 12/07/19   Enzo Bi, MD  lithium carbonate (LITHOBID) 300 MG CR tablet Take 300 mg by mouth every 12 (twelve) hours.     [provider]  liver oil-zinc oxide (DESITIN) 40 % ointment Apply topically as needed for irritation. 12/07/19   Enzo Bi, MD  metFORMIN (GLUCOPHAGE-XR) 500 MG 24 hr tablet Take 1,000 mg by mouth daily with breakfast.    [provider]  nystatin cream (MYCOSTATIN) Apply 1 application topically 2 (two) times daily. 12/07/19   Enzo Bi, MD  omeprazole (PRILOSEC) 20 MG capsule Take 20 mg by mouth 2 (two) times daily.  12/26/18   [provider]  polyethylene glycol (MIRALAX) 17 g packet Take 17 g by mouth daily.     [provider]  senna (SENOKOT) 8.6 MG tablet Take 1 tablet by mouth at bedtime.     [provider]  sertraline (ZOLOFT)  100 MG tablet Take 100 mg by mouth in the morning.     [provider]  traZODone (DESYREL) 50 MG tablet Take 1 tablet (50 mg total) by mouth at bedtime. 08/19/16   Loletha Grayer, MD    Allergies Bee venom  Family History  Problem Relation Age of Onset  . Alcohol abuse Father   . Breast cancer Neg Hx     Social History Social History   Tobacco Use  . Smoking status: Former Smoker    Quit date: 07/08/2013    Years since quitting: 6.9  . Smokeless tobacco: Former Network engineer Use Topics  . Alcohol use: No    Alcohol/week: 0.0 standard drinks  . Drug use: No    Review of Systems Level 5 caveat: Review of systems limited due to cognitive impairment  ENT: No cough or congestion. Cardiovascular: Denies chest pain. Respiratory: Denies  shortness of breath. Gastrointestinal: No vomiting. Genitourinary: Negative for flank pain. Musculoskeletal: Negative for back pain. Neurological: Negative for headache.  ____________________________________________   PHYSICAL EXAM:  VITAL SIGNS: ED Triage Vitals  Enc Vitals Group     BP 06/30/20 1012 129/83     Pulse Rate 06/30/20 1012 67     Resp 06/30/20 1012 20     Temp 06/30/20 1012 98.6 F (37 C)     Temp Source 06/30/20 1012 Oral     SpO2 06/30/20 1012 95 %     Weight 06/30/20 1013 185 lb (83.9 kg)     Height 06/30/20 1013 5\' 8"  (1.727 m)     Head Circumference --      Peak Flow --      Pain Score 06/30/20 1013 0     Pain Loc --      Pain Edu? --      Excl. in [place]? --     Constitutional: Alert, oriented x2.  Comfortable appearing, in no acute distress. Eyes: Conjunctivae are normal.  EOMI.  PERRLA. Head: Atraumatic. Nose: No congestion/rhinnorhea. Mouth/Throat: Mucous membranes are moist.   Neck: Normal range of motion.  Cardiovascular: Normal rate, regular rhythm. Grossly normal heart sounds.  Good peripheral circulation. Respiratory: Normal respiratory effort.  No retractions. Lungs  CTAB. Gastrointestinal: Soft and nontender. No distention.  Genitourinary: No flank tenderness. Musculoskeletal: No lower extremity edema.  Extremities warm and well perfused.  Neurologic:  Normal speech and language.  Motor intact in all extremities.  Normal coordination. Skin:  Skin is warm and dry. No rash noted. Psychiatric: Calm and cooperative.  ____________________________________________   LABS (all labs ordered are listed, but only abnormal results are displayed)  Labs Reviewed  COMPREHENSIVE METABOLIC PANEL - Abnormal; Notable for the following components:      Result Value   Glucose, Bld 173 (*)    BUN 25 (*)    Creatinine, Ser 1.33 (*)    AST 14 (*)    GFR, Estimated 46 (*)    All other components within normal limits  CBC WITH DIFFERENTIAL/PLATELET - Abnormal; Notable for the following components:   MCH 25.3 (*)    MCHC 29.8 (*)    All other components within normal limits  URINALYSIS, COMPLETE (UACMP) WITH MICROSCOPIC - Abnormal; Notable for the following components:   Color, Urine COLORLESS (*)    APPearance CLEAR (*)    Specific Gravity, Urine 1.002 (*)    Glucose, UA >=500 (*)    All other components within normal limits  SARS CORONAVIRUS 2 (TAT 6-24 HRS)  BRAIN NATRIURETIC PEPTIDE  TROPONIN I (HIGH SENSITIVITY)  TROPONIN I (HIGH SENSITIVITY)   ____________________________________________  EKG  ED ECG REPORT I, Arta Silence, the attending physician, personally viewed and interpreted this ECG.  Date: 06/30/2020 EKG Time: 1013 Rate: 70 Rhythm: normal sinus rhythm QRS Axis: normal Intervals: RBBB, LAFB ST/T Wave abnormalities: LVH with nonspecific ST abnormalities (incorrectly read by machine as acute MI) Narrative Interpretation: Nonspecific abnormalities with no evidence of acute ischemia; no significant change when compared to EKG of 11/22/2019  ____________________________________________  RADIOLOGY  CT head: No ICH or other acute  abnormality Chest x-ray interpreted by me shows bibasilar atelectasis with no focal infiltrate or edema  ____________________________________________   PROCEDURES  Procedure(s) performed: No  Procedures  Critical Care performed: No ____________________________________________   INITIAL IMPRESSION / ASSESSMENT AND PLAN / ED COURSE  Pertinent labs & imaging results that were available during my care of the patient  were reviewed by me and considered in my medical decision making (see chart for details).  60 year old female with PMH as noted above including cognitive impairment, schizophrenia, CHF, CAD, and diabetes presents due to hypoxia noted by her caregiver at the group home this morning along with possible altered mental status at night with some hallucinations.  Per the caregiver, the patient is at her baseline currently.  I reviewed the past medical records in [place].  The patient was most recent admitted in September of last year with respiratory failure due to COVID-19 pneumonia.  She has had no ED visits since that time.  She previously was on 2 L of O2 by nasal cannula but the caregiver reports that this was recently discontinued as she did not need it anymore.  On exam, the patient is currently alert and answering questions appropriately.  Her vital signs are normal.  O2 saturation is in the mid 90s on room air and she does not demonstrate increased work of breathing or respiratory distress.  Lungs are clear bilaterally.  Neurologic exam is nonfocal.  Overall the etiology of her hypoxia earlier today and the intermittent delirium or altered mental status is unclear.  Differential includes worsening CHF, pulmonary edema, other cardiac cause, pneumonia, viral syndrome, dehydration, other metabolic derangement, medication side effect.  The patient was recently started on several new medications.  I have a low suspicion for primary CNS cause of the altered mental status.  We will  obtain chest x-ray, CT head, lab work-up, and reassess.  ----------------------------------------- 2:12 PM on 06/30/2020 -----------------------------------------  The patient has remained alert and has had a normal O2 saturation throughout her ED stay, now over 4 hours.  Lab work-up is unremarkable including negative troponins x2.  She does have a slightly increased creatinine from baseline but there is no indication for emergent treatment.  Chest x-ray showed no focal infiltrate or other acute abnormalities, CT head was also negative for acute findings.  It is possible that the patient could be having sleep apnea or other transient cause of hypoxia during the evening or early morning.  She has shown no evidence of altered mental status or delirium while in the ED.  At this time, she is stable for discharge home.  I discussed the results of the work-up with the patient's family member as well as the caregiver from the group home both of whom are comfortable with the discharge plan.  Return precautions provided and they expressed understanding.  ____________________________________________   FINAL CLINICAL IMPRESSION(S) / ED DIAGNOSES  Final diagnoses:  Hypoxia      NEW MEDICATIONS STARTED DURING THIS VISIT:  New Prescriptions   No medications on file     Note:  This document was prepared using Dragon voice recognition software and may include unintentional dictation errors.    Arta Silence, MD 06/30/20 1423

## 2020-07-14 DIAGNOSIS — F22 Delusional disorders: Secondary | ICD-10-CM | POA: Insufficient documentation

## 2020-07-14 DIAGNOSIS — R413 Other amnesia: Secondary | ICD-10-CM | POA: Insufficient documentation

## 2020-07-14 DIAGNOSIS — G479 Sleep disorder, unspecified: Secondary | ICD-10-CM | POA: Insufficient documentation

## 2020-12-29 DIAGNOSIS — F79 Unspecified intellectual disabilities: Secondary | ICD-10-CM | POA: Insufficient documentation

## 2021-02-10 ENCOUNTER — Other Ambulatory Visit: Payer: Self-pay | Admitting: Family Medicine

## 2021-02-10 DIAGNOSIS — Z1231 Encounter for screening mammogram for malignant neoplasm of breast: Secondary | ICD-10-CM

## 2021-04-07 DIAGNOSIS — R441 Visual hallucinations: Secondary | ICD-10-CM | POA: Insufficient documentation

## 2021-04-21 ENCOUNTER — Ambulatory Visit
Admission: RE | Admit: 2021-04-21 | Discharge: 2021-04-21 | Disposition: A | Discharge status: Home / Self Care | Payer: Medicare Other | Source: Ambulatory Visit | Attending: Family Medicine | Admitting: Family Medicine

## 2021-04-21 ENCOUNTER — Other Ambulatory Visit: Payer: Self-pay

## 2021-04-21 DIAGNOSIS — Z1231 Encounter for screening mammogram for malignant neoplasm of breast: Secondary | ICD-10-CM | POA: Insufficient documentation

## 2021-05-07 ENCOUNTER — Other Ambulatory Visit: Payer: Self-pay

## 2021-05-07 ENCOUNTER — Emergency Department
Admission: EM | Admit: 2021-05-07 | Discharge: 2021-05-07 | Disposition: A | Discharge status: Home / Self Care | Payer: Medicare Other | Attending: Emergency Medicine | Admitting: Emergency Medicine

## 2021-05-07 ENCOUNTER — Emergency Department: Payer: Medicare Other

## 2021-05-07 DIAGNOSIS — I509 Heart failure, unspecified: Secondary | ICD-10-CM | POA: Diagnosis not present

## 2021-05-07 DIAGNOSIS — E119 Type 2 diabetes mellitus without complications: Secondary | ICD-10-CM | POA: Insufficient documentation

## 2021-05-07 DIAGNOSIS — I2510 Atherosclerotic heart disease of native coronary artery without angina pectoris: Secondary | ICD-10-CM | POA: Diagnosis not present

## 2021-05-07 DIAGNOSIS — Z20822 Contact with and (suspected) exposure to covid-19: Secondary | ICD-10-CM | POA: Diagnosis not present

## 2021-05-07 DIAGNOSIS — R4182 Altered mental status, unspecified: Secondary | ICD-10-CM | POA: Diagnosis not present

## 2021-05-07 DIAGNOSIS — N390 Urinary tract infection, site not specified: Secondary | ICD-10-CM | POA: Insufficient documentation

## 2021-05-07 DIAGNOSIS — R079 Chest pain, unspecified: Secondary | ICD-10-CM | POA: Diagnosis present

## 2021-05-07 DIAGNOSIS — Z8616 Personal history of COVID-19: Secondary | ICD-10-CM | POA: Diagnosis not present

## 2021-05-07 LAB — URINALYSIS, ROUTINE W REFLEX MICROSCOPIC
Bilirubin Urine: NEGATIVE
Glucose, UA: >=500 mg/dL — AB
Hgb urine dipstick: NEGATIVE
Ketones, ur: NEGATIVE mg/dL
Nitrite: NEGATIVE
Protein, ur: 30 mg/dL — AB
Specific Gravity, Urine: 1.003 — ABNORMAL LOW (ref 1.005–1.030)
WBC, UA: >50 WBC/hpf — ABNORMAL HIGH (ref 0–5)
pH: 5 (ref 5.0–8.0)

## 2021-05-07 LAB — COMPREHENSIVE METABOLIC PANEL
ALT: 10 U/L (ref 0–44)
AST: 11 U/L — ABNORMAL LOW (ref 15–41)
Albumin: 2.6 g/dL — ABNORMAL LOW (ref 3.5–5.0)
Alkaline Phosphatase: 82 U/L (ref 38–126)
Anion gap: 6 (ref 5–15)
BUN: 21 mg/dL — ABNORMAL HIGH (ref 6–20)
CO2: 26 mmol/L (ref 22–32)
Calcium: 9.3 mg/dL (ref 8.9–10.3)
Chloride: 104 mmol/L (ref 98–111)
Creatinine, Ser: 1.22 mg/dL — ABNORMAL HIGH (ref 0.44–1.00)
GFR, Estimated: 51 mL/min — ABNORMAL LOW (ref >60)
Glucose, Bld: 272 mg/dL — ABNORMAL HIGH (ref 70–99)
Potassium: 3.8 mmol/L (ref 3.5–5.1)
Sodium: 136 mmol/L (ref 135–145)
Total Bilirubin: 0.5 mg/dL (ref 0.3–1.2)
Total Protein: 5.9 g/dL — ABNORMAL LOW (ref 6.5–8.1)

## 2021-05-07 LAB — CBC WITH DIFFERENTIAL/PLATELET
Abs Immature Granulocytes: 0.03 10*3/uL (ref 0.00–0.07)
Basophils Absolute: 0 10*3/uL (ref 0.0–0.1)
Basophils Relative: 1 %
Eosinophils Absolute: 0.1 10*3/uL (ref 0.0–0.5)
Eosinophils Relative: 1 %
HCT: 34.5 % — ABNORMAL LOW (ref 36.0–46.0)
Hemoglobin: 10.4 g/dL — ABNORMAL LOW (ref 12.0–15.0)
Immature Granulocytes: 0 %
Lymphocytes Relative: 23 %
Lymphs Abs: 1.7 10*3/uL (ref 0.7–4.0)
MCH: 28 pg (ref 26.0–34.0)
MCHC: 30.1 g/dL (ref 30.0–36.0)
MCV: 93 fL (ref 80.0–100.0)
Monocytes Absolute: 0.6 10*3/uL (ref 0.1–1.0)
Monocytes Relative: 8 %
Neutro Abs: 4.7 10*3/uL (ref 1.7–7.7)
Neutrophils Relative %: 67 %
Platelets: 378 10*3/uL (ref 150–400)
RBC: 3.71 MIL/uL — ABNORMAL LOW (ref 3.87–5.11)
RDW: 13.4 % (ref 11.5–15.5)
WBC: 7.1 10*3/uL (ref 4.0–10.5)
nRBC: 0 % (ref 0.0–0.2)

## 2021-05-07 LAB — RESP PANEL BY RT-PCR (FLU A&B, COVID) ARPGX2
Influenza A by PCR: NEGATIVE
Influenza B by PCR: NEGATIVE
SARS Coronavirus 2 by RT PCR: NEGATIVE

## 2021-05-07 LAB — TROPONIN I (HIGH SENSITIVITY)
Troponin I (High Sensitivity): 13 ng/L (ref <18)
Troponin I (High Sensitivity): 14 ng/L (ref <18)

## 2021-05-07 LAB — LACTIC ACID, PLASMA: Lactic Acid, Venous: 1.6 mmol/L (ref 0.5–1.9)

## 2021-05-07 MED ORDER — SODIUM CHLORIDE 0.9 % IV SOLN
1.0000 g | Freq: Once | INTRAVENOUS | Status: AC
  Administered 2021-05-07: 1 g via INTRAVENOUS
  Filled 2021-05-07: qty 10

## 2021-05-07 MED ORDER — CEPHALEXIN 500 MG PO CAPS: 500.0000 mg | ORAL_CAPSULE | Freq: Two times a day (BID) | ORAL | 0 refills | Status: AC

## 2021-05-07 NOTE — ED Provider Notes (Signed)
Repeat opponent negative.  Patient tolerating antibiotics.  Does appear clinically stable and appropriate for outpatient follow-up   Merlyn Lot, MD 05/07/21 [phone]

## 2021-05-07 NOTE — ED Provider Notes (Signed)
[facility] Provider Note    Event Date/Time   First MD Initiated Contact with Patient 05/07/21 1219     (approximate)   History   Chest Pain and Altered Mental Status   HPI  Level 5 caveat: History of present illness limited due to cognitive impairment  Elizabeth Gutierrez is a 61 y.o. female with a history of cognitive impairment, schizophrenia, CHF, CAD, and diabetes who presents from her day center with altered mental status and chest pain.  The patient states that she had chest pain earlier and that she is not having it now, but is not able to give much other history.  Per EMS, staff at the day center was stated that the patient was not acting like her usual self although they were not able to describe her baseline or give any further information on what her actual symptoms were today.  Initially code STEMI was called as the patient had an abnormal EMS EKG although this was canceled by the cardiologist Dr. Fletcher Anon after reviewing her prior EKGs.   Physical Exam   Triage Vital Signs: ED Triage Vitals  Enc Vitals Group     BP 05/07/21 1224 (!) 104/51     Pulse Rate 05/07/21 1224 78     Resp 05/07/21 1224 20     Temp 05/07/21 1224 98.1 F (36.7 C)     Temp Source 05/07/21 1224 Oral     SpO2 05/07/21 1224 96 %     Weight 05/07/21 1227 162 lb 11.2 oz (73.8 kg)     Height --      Head Circumference --      Peak Flow --      Pain Score --      Pain Loc --      Pain Edu? --      Excl. in [place]? --     Most recent vital signs: Vitals:   05/07/21 1224  BP: (!) 104/51  Pulse: 78  Resp: 20  Temp: 98.1 F (36.7 C)  SpO2: 96%     General: Alert, oriented x2, no distress.  CV:  Good peripheral perfusion.  Resp:  Normal effort.  Lungs CTAB. Abd:  No distention.  Soft and nontender. Other:  EOMI.  PERRLA.  Motor intact in all extremities.  ED Results / Procedures / Treatments   Labs (all labs ordered are listed, but only abnormal results are  displayed) Labs Reviewed  COMPREHENSIVE METABOLIC PANEL - Abnormal; Notable for the following components:      Result Value   Glucose, Bld 272 (*)    BUN 21 (*)    Creatinine, Ser 1.22 (*)    Total Protein 5.9 (*)    Albumin 2.6 (*)    AST 11 (*)    GFR, Estimated 51 (*)    All other components within normal limits  CBC WITH DIFFERENTIAL/PLATELET - Abnormal; Notable for the following components:   RBC 3.71 (*)    Hemoglobin 10.4 (*)    HCT 34.5 (*)    All other components within normal limits  URINALYSIS, ROUTINE W REFLEX MICROSCOPIC - Abnormal; Notable for the following components:   Color, Urine YELLOW (*)    APPearance TURBID (*)    Specific Gravity, Urine 1.003 (*)    Glucose, UA >=500 (*)    Protein, ur 30 (*)    Leukocytes,Ua LARGE (*)    WBC, UA >50 (*)    Bacteria, UA RARE (*)  All other components within normal limits  RESP PANEL BY RT-PCR (FLU A&B, COVID) ARPGX2  LACTIC ACID, PLASMA  TROPONIN I (HIGH SENSITIVITY)  TROPONIN I (HIGH SENSITIVITY)     EKG  ED ECG REPORT I, Arta Silence, the attending physician, personally viewed and interpreted this ECG.  Date: 05/07/2021 EKG Time: 1221 Rate: 77 Rhythm: normal sinus rhythm QRS Axis: normal Intervals: RBBB, LAFB ST/T Wave abnormalities: LVH with repolarization abnormality Narrative Interpretation: Nonspecific abnormalities with no evidence of acute ischemia; no significant change when compared to EKG of 06/30/2020   RADIOLOGY  CT head: I independently viewed and interpreted the images; there is no evidence of ICH or other acute abnormality  PROCEDURES:  Critical Care performed: No  Procedures   MEDICATIONS ORDERED IN ED: Medications  cefTRIAXone (ROCEPHIN) 1 g in sodium chloride 0.9 % 100 mL IVPB (has no administration in time range)     IMPRESSION / MDM / ASSESSMENT AND PLAN / ED COURSE  I reviewed the triage vital signs and the nursing notes.  61 year old female with PMH as noted  above including cognitive impairment, schizophrenia, CHF, CAD, diabetes presents with now resolved chest pain as well as apparent altered mental status, although the nature of this is not clear.  The patient herself is unable to give much history.  On exam, the vital signs are normal.  The patient is alert and oriented x2.  Neurologic exam is nonfocal.  The physical exam is otherwise unremarkable.  I reviewed the past medical records.  The patient was most recently admitted in September 2021, and per the discharge summary from the hospitalist service she was admitted for respiratory failure due to COVID-19 pneumonia.  She was also seen in the ED by me on 4/12 due to hypoxia and some hallucinations.  She was noted to be alert and oriented x2 at that time.  EMS had activated code STEMI but upon review of the EMS EKG in comparison with prior EKGs, there are no acute changes.  EKG performed in the ED also shows no evidence of STEMI.  STEMI code was canceled by Dr. Fletcher Anon from cardiology.  Chest pain: Differential diagnosis includes, but is not limited to, musculoskeletal pain, ACS, pneumonia, bronchitis.  I have a low suspicion for PE or vascular etiology given the normal vital signs, unchanged EKG, and the fact that it has completely resolved.  We will obtain lab work-up including cardiac enzymes.  Altered mental status: Differential is broad and the exact nature and degree of the altered mental status is unclear.  The patient currently appears to be close to her baseline at least as documented previously.  Differential includes, but is not limited to, dehydration, electrolyte abnormality, other metabolic cause, medication side effect, behavioral disturbance, or less likely primary CNS cause.  We will obtain lab work-up and CT head.  ----------------------------------------- 3:22 PM on 05/07/2021 -----------------------------------------  I was able to discuss the the patient with a caregiver from her  group home who is now here with her.  She confirms that the patient is at her baseline mental status.  The patient continues to remain alert and oriented x2.  Initial troponin is negative.  BMP and CBC are otherwise unremarkable for acute findings.  Lactate is normal.  Respiratory panel is negative.  CT head shows no acute abnormality.  Urinalysis shows findings consistent with a UTI.  We have ordered a dose of IV ceftriaxone.  This likely explains the patient's possible delirium or transient AMS.  I did consider  whether the patient may require admission, however given that she is at her baseline mental status now, has no evidence of sepsis, and is tolerating p.o., she would likely be appropriate for discharge to her facility if she is ruled out for ACS.  Repeat troponin is pending.  I have signed the patient out to the oncoming ED physician Dr. Quentin Cornwall.   FINAL CLINICAL IMPRESSION(S) / ED DIAGNOSES   Final diagnoses:  Urinary tract infection without hematuria, site unspecified  Chest pain, unspecified type     Rx / DC Orders   ED Discharge Orders          Ordered    cephALEXin (KEFLEX) 500 MG capsule  2 times daily        05/07/21 1522             Note:  This document was prepared using Dragon voice recognition software and may include unintentional dictation errors.    Arta Silence, MD 05/07/21 1524

## 2021-05-07 NOTE — ED Notes (Signed)
Pt's caregiver provided discharge instructions and prescription information. Pt and cargiver was given the opportunity to ask questions and questions were answered. Discharge signature not obtained in the setting of the COVID-19 pandemic in order to reduce high touch surfaces.

## 2021-05-07 NOTE — Discharge Instructions (Addendum)
Elizabeth Gutierrez should take the antibiotic as prescribed and finish the full course.  She should return to the ER immediately for new, worsening, or recurrent confusion, change in mental status, weakness, chest pain, difficulty breathing, fever, or any other new or worsening symptoms that are concerning.

## 2021-05-07 NOTE — Progress Notes (Signed)
°   05/07/21 1225  Clinical Encounter Type  Visited With Patient not available;Health care provider  Visit Type Initial;Code  Spiritual Encounters  Spiritual Needs Other (Comment) (not assessed)   Chaplain Burris present on unit at time of Pt arrival. Attended to the room although code was cancelled and Pt not yet available to engage. Will f/u to assess needs and to offer support.

## 2021-05-07 NOTE — ED Notes (Signed)
Patient transported to CT 

## 2021-05-07 NOTE — ED Triage Notes (Signed)
Pt BIB EMS from [facility] an adult day care facility. Staff reported pt is not her "normal self."  They were unable to describe what was different from pt's baseline nor describe pt's baseline. Pt is reported to have a lower B/P. Pt c/o CP with radiation into L jaw. EMS encoded a STEMI that was cancelled PTA by the EDP.   EMS Vitals: 96/50 HR 70 SpO2 98% CBG 305.   Pt is alert and only oriented to person on arrival.

## 2021-05-07 NOTE — Progress Notes (Signed)
°   05/07/21 1245  Clinical Encounter Type  Visited With Patient  Visit Type Follow-up;Social support   Chaplain Burris returned to Pt's room to offer support. Pt appeared to be tolerating interventions well; support seems helpful due to cognitive status and her having come from a setting of care. Will continue to follow as able or needed.

## 2021-06-28 IMAGING — MG DIGITAL SCREENING BILAT W/ TOMO W/ CAD
6 of 10 series · 6 of 30 positions shown · non-contrast
Comparison: Previous exam(s).

CLINICAL DATA: Screening.

EXAM:
DIGITAL SCREENING BILATERAL MAMMOGRAM WITH TOMO AND CAD

[L CC synth-2D]
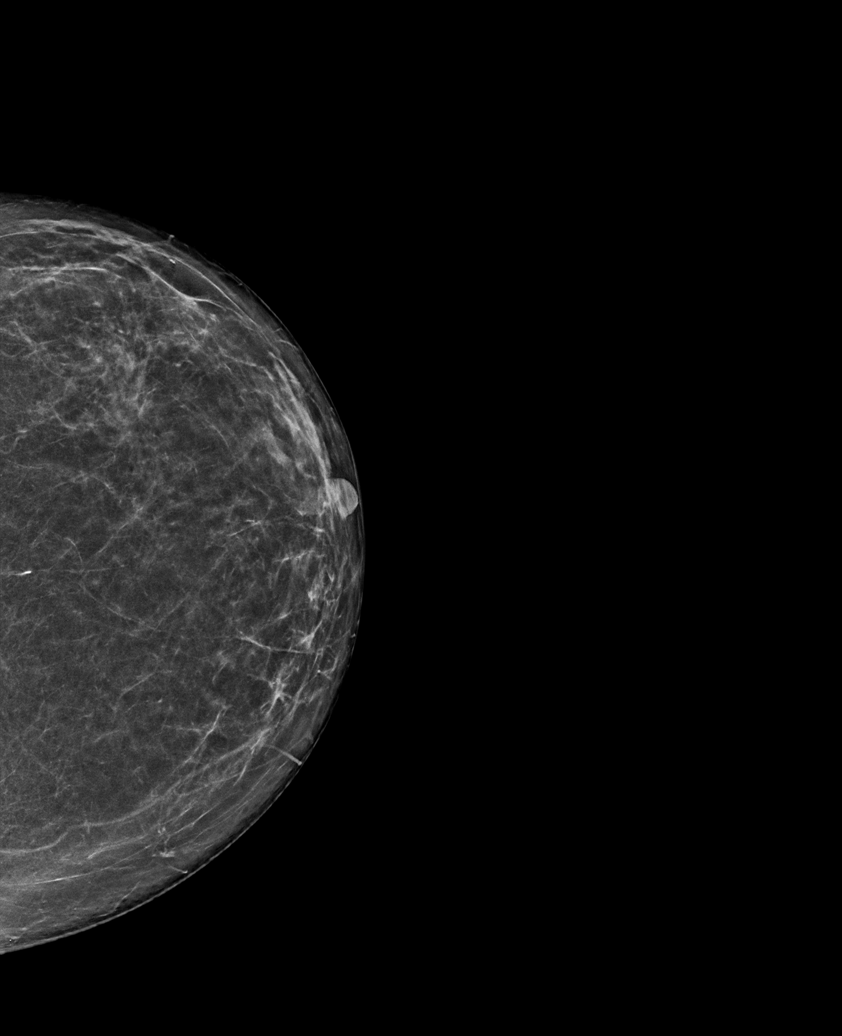

[L MLO synth-2D]
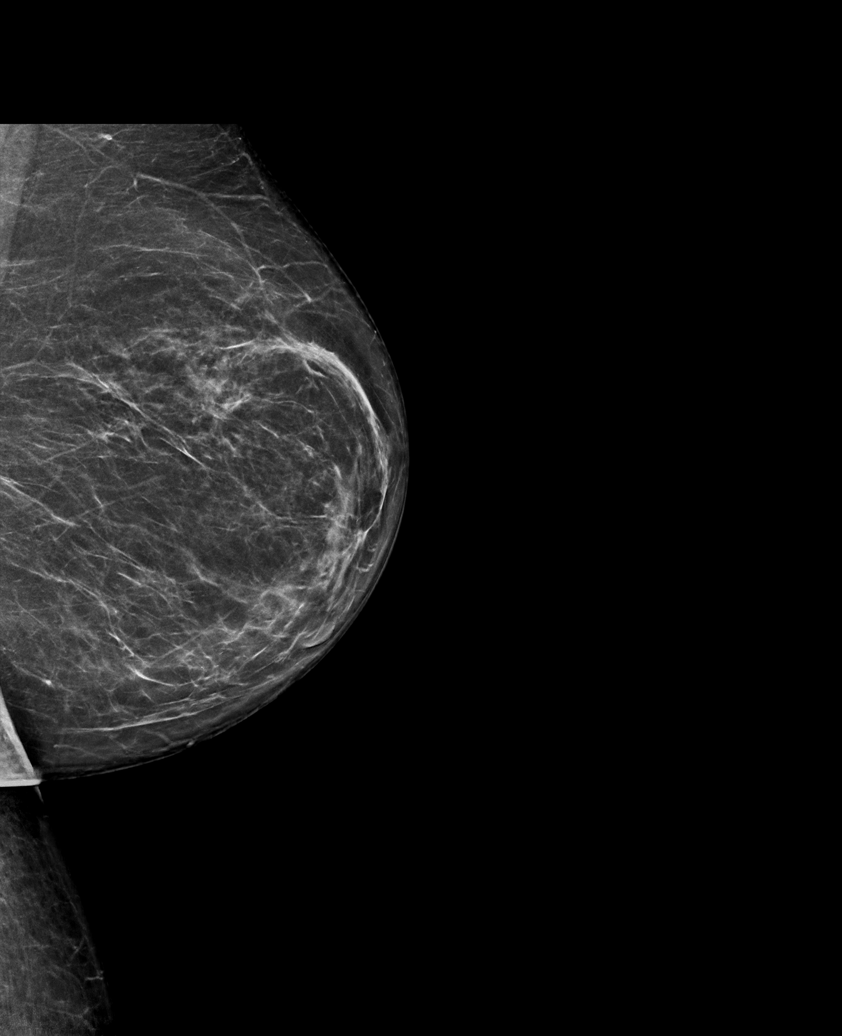

[R CC synth-2D]
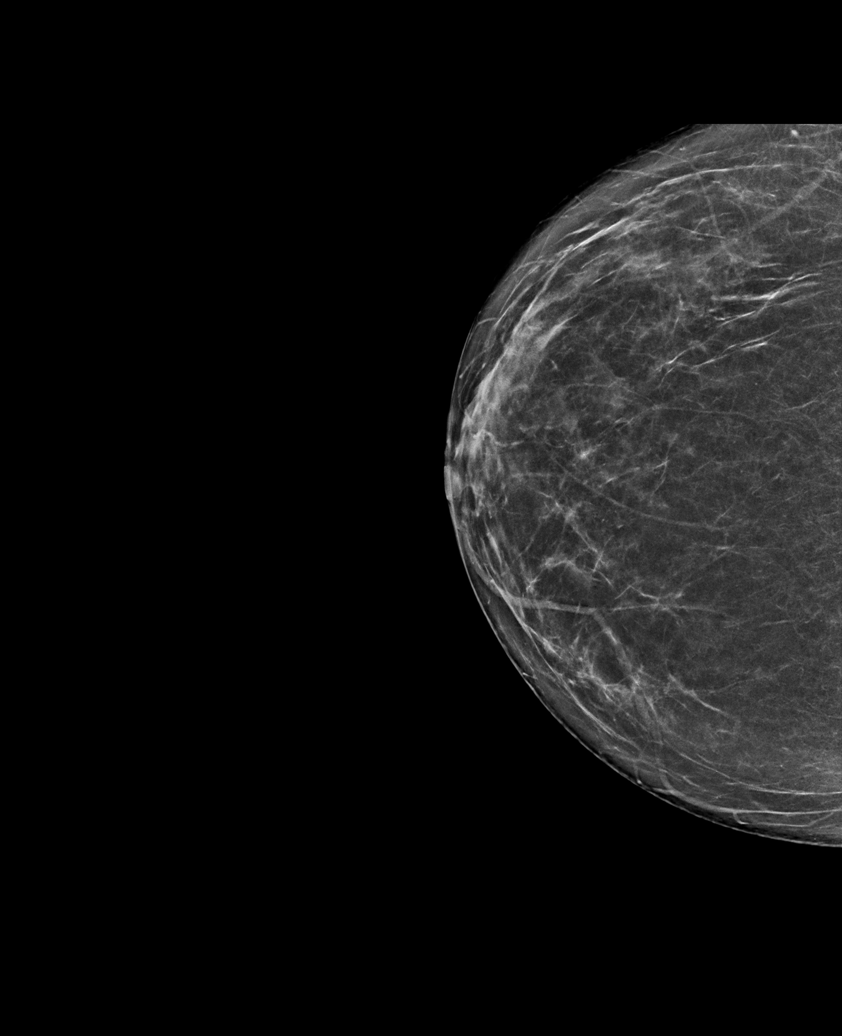

[L XCCL synth-2D]
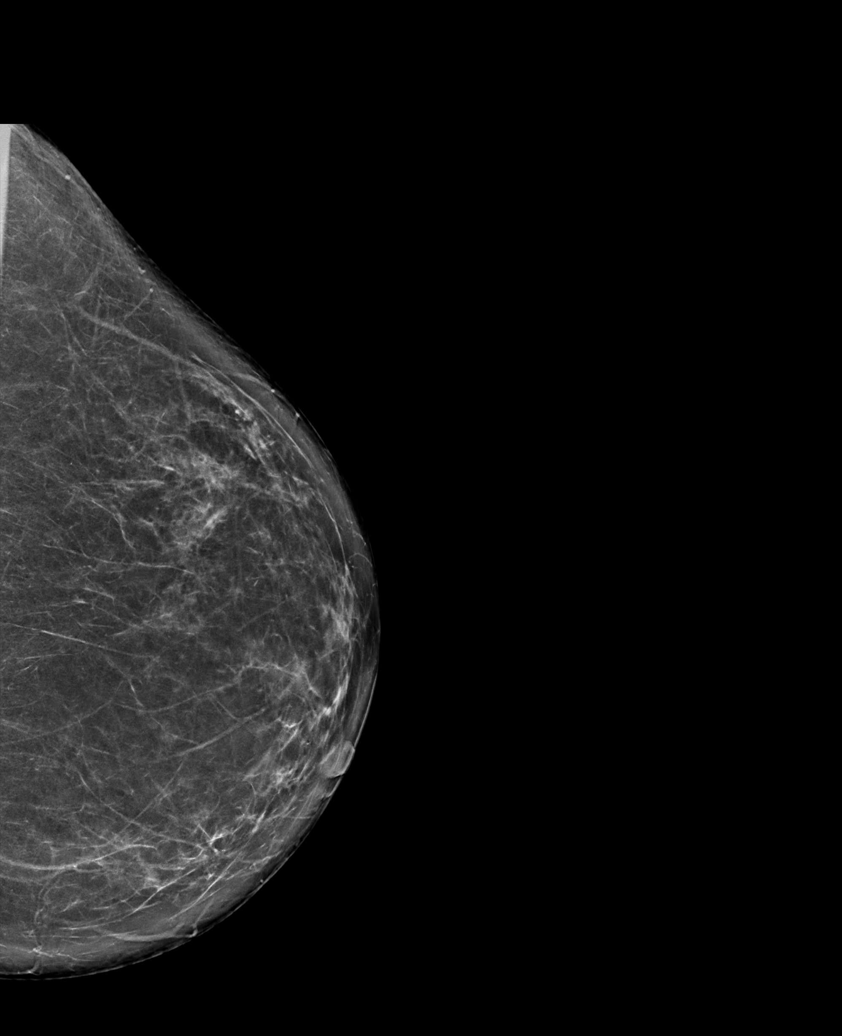

[R MLO synth-2D]
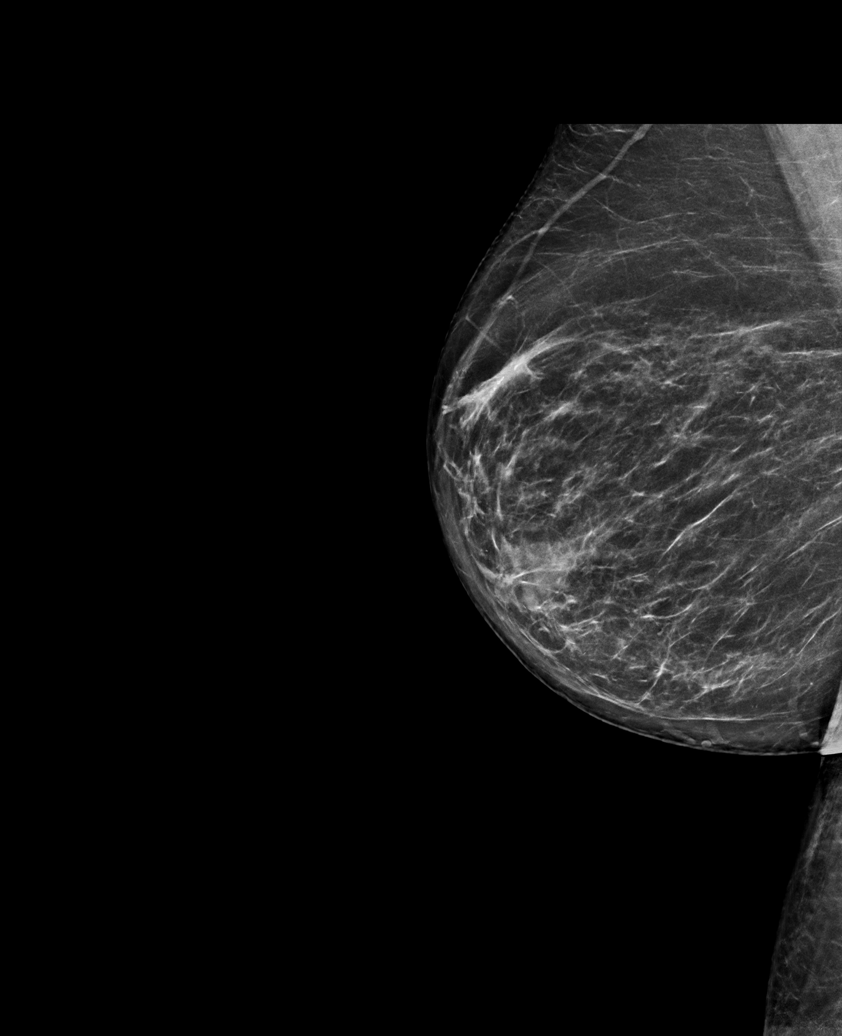

[R CC tomo · tomo slice 33/66.0]
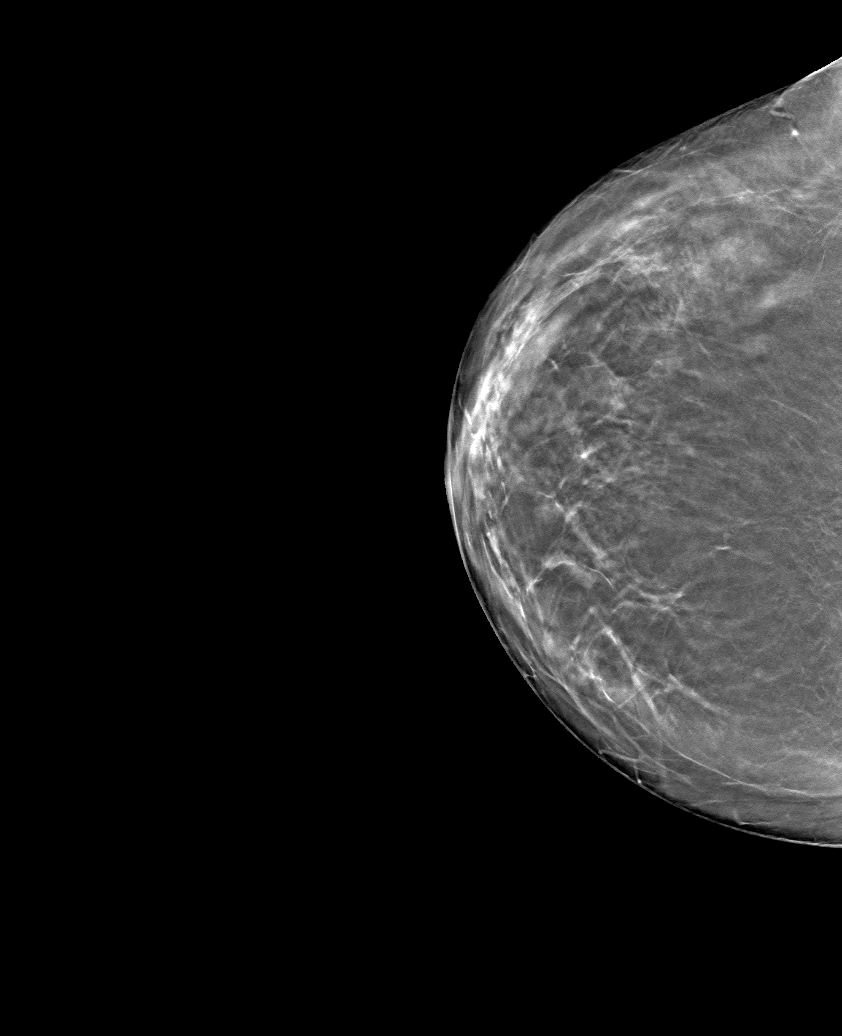

[6 of 30 positions shown; findings below may reference images not displayed]

ACR Breast Density Category b: There are scattered areas of
fibroglandular density.
FINDINGS: There are no findings suspicious for malignancy. Images were
processed with CAD.
IMPRESSION: No mammographic evidence of malignancy. A result letter of this
screening mammogram will be mailed directly to the patient.

RECOMMENDATION:
Screening mammogram in one year. (Code:[id number])

BI-RADS CATEGORY  1: Negative.

## 2021-08-30 ENCOUNTER — Other Ambulatory Visit: Payer: Self-pay | Admitting: Sports Medicine

## 2021-08-30 DIAGNOSIS — G8929 Other chronic pain: Secondary | ICD-10-CM

## 2021-08-30 DIAGNOSIS — W19XXXA Unspecified fall, initial encounter: Secondary | ICD-10-CM

## 2021-09-16 ENCOUNTER — Ambulatory Visit
Admission: RE | Admit: 2021-09-16 | Discharge: 2021-09-16 | Disposition: A | Discharge status: Home / Self Care | Payer: Medicare Other | Source: Ambulatory Visit | Attending: Sports Medicine | Admitting: Sports Medicine

## 2021-09-16 DIAGNOSIS — G8929 Other chronic pain: Secondary | ICD-10-CM | POA: Diagnosis not present

## 2021-09-16 DIAGNOSIS — M25562 Pain in left knee: Secondary | ICD-10-CM | POA: Diagnosis present

## 2021-09-16 DIAGNOSIS — W19XXXA Unspecified fall, initial encounter: Secondary | ICD-10-CM | POA: Diagnosis not present

## 2021-11-11 DIAGNOSIS — M1710 Unilateral primary osteoarthritis, unspecified knee: Secondary | ICD-10-CM | POA: Insufficient documentation

## 2022-03-07 ENCOUNTER — Other Ambulatory Visit: Payer: Self-pay

## 2022-03-07 ENCOUNTER — Emergency Department: Payer: Medicare Other

## 2022-03-07 DIAGNOSIS — G9341 Metabolic encephalopathy: Secondary | ICD-10-CM | POA: Diagnosis present

## 2022-03-07 DIAGNOSIS — N179 Acute kidney failure, unspecified: Secondary | ICD-10-CM | POA: Diagnosis present

## 2022-03-07 DIAGNOSIS — Z79890 Hormone replacement therapy (postmenopausal): Secondary | ICD-10-CM

## 2022-03-07 DIAGNOSIS — Z7984 Long term (current) use of oral hypoglycemic drugs: Secondary | ICD-10-CM

## 2022-03-07 DIAGNOSIS — E1165 Type 2 diabetes mellitus with hyperglycemia: Secondary | ICD-10-CM | POA: Diagnosis present

## 2022-03-07 DIAGNOSIS — I2699 Other pulmonary embolism without acute cor pulmonale: Secondary | ICD-10-CM | POA: Diagnosis present

## 2022-03-07 DIAGNOSIS — F0393 Unspecified dementia, unspecified severity, with mood disturbance: Secondary | ICD-10-CM | POA: Diagnosis present

## 2022-03-07 DIAGNOSIS — E039 Hypothyroidism, unspecified: Secondary | ICD-10-CM | POA: Diagnosis present

## 2022-03-07 DIAGNOSIS — Z811 Family history of alcohol abuse and dependence: Secondary | ICD-10-CM

## 2022-03-07 DIAGNOSIS — I130 Hypertensive heart and chronic kidney disease with heart failure and stage 1 through stage 4 chronic kidney disease, or unspecified chronic kidney disease: Secondary | ICD-10-CM | POA: Diagnosis present

## 2022-03-07 DIAGNOSIS — E785 Hyperlipidemia, unspecified: Secondary | ICD-10-CM | POA: Diagnosis present

## 2022-03-07 DIAGNOSIS — N1831 Chronic kidney disease, stage 3a: Secondary | ICD-10-CM | POA: Diagnosis present

## 2022-03-07 DIAGNOSIS — H1033 Unspecified acute conjunctivitis, bilateral: Secondary | ICD-10-CM | POA: Diagnosis present

## 2022-03-07 DIAGNOSIS — Z794 Long term (current) use of insulin: Secondary | ICD-10-CM

## 2022-03-07 DIAGNOSIS — N390 Urinary tract infection, site not specified: Secondary | ICD-10-CM | POA: Diagnosis not present

## 2022-03-07 DIAGNOSIS — F200 Paranoid schizophrenia: Secondary | ICD-10-CM | POA: Diagnosis present

## 2022-03-07 DIAGNOSIS — E1169 Type 2 diabetes mellitus with other specified complication: Secondary | ICD-10-CM | POA: Diagnosis present

## 2022-03-07 DIAGNOSIS — Z79899 Other long term (current) drug therapy: Secondary | ICD-10-CM

## 2022-03-07 DIAGNOSIS — I4510 Unspecified right bundle-branch block: Secondary | ICD-10-CM | POA: Diagnosis present

## 2022-03-07 DIAGNOSIS — W06XXXA Fall from bed, initial encounter: Secondary | ICD-10-CM | POA: Diagnosis present

## 2022-03-07 DIAGNOSIS — Z1152 Encounter for screening for COVID-19: Secondary | ICD-10-CM

## 2022-03-07 DIAGNOSIS — I5042 Chronic combined systolic (congestive) and diastolic (congestive) heart failure: Secondary | ICD-10-CM | POA: Diagnosis present

## 2022-03-07 DIAGNOSIS — I252 Old myocardial infarction: Secondary | ICD-10-CM

## 2022-03-07 DIAGNOSIS — E1122 Type 2 diabetes mellitus with diabetic chronic kidney disease: Secondary | ICD-10-CM | POA: Diagnosis present

## 2022-03-07 DIAGNOSIS — A4150 Gram-negative sepsis, unspecified: Principal | ICD-10-CM | POA: Diagnosis present

## 2022-03-07 DIAGNOSIS — F32A Depression, unspecified: Secondary | ICD-10-CM | POA: Diagnosis present

## 2022-03-07 DIAGNOSIS — I2510 Atherosclerotic heart disease of native coronary artery without angina pectoris: Secondary | ICD-10-CM | POA: Diagnosis present

## 2022-03-07 DIAGNOSIS — Y92092 Bedroom in other non-institutional residence as the place of occurrence of the external cause: Secondary | ICD-10-CM

## 2022-03-07 DIAGNOSIS — F70 Mild intellectual disabilities: Secondary | ICD-10-CM | POA: Diagnosis present

## 2022-03-07 DIAGNOSIS — Z7901 Long term (current) use of anticoagulants: Secondary | ICD-10-CM

## 2022-03-07 DIAGNOSIS — Z87891 Personal history of nicotine dependence: Secondary | ICD-10-CM

## 2022-03-07 DIAGNOSIS — Z7982 Long term (current) use of aspirin: Secondary | ICD-10-CM

## 2022-03-07 DIAGNOSIS — J9601 Acute respiratory failure with hypoxia: Secondary | ICD-10-CM | POA: Diagnosis present

## 2022-03-07 LAB — CBC WITH DIFFERENTIAL/PLATELET
Abs Immature Granulocytes: 0.16 10*3/uL — ABNORMAL HIGH (ref 0.00–0.07)
Basophils Absolute: 0.1 10*3/uL (ref 0.0–0.1)
Basophils Relative: 0 %
Eosinophils Absolute: 0 10*3/uL (ref 0.0–0.5)
Eosinophils Relative: 0 %
HCT: 36.5 % (ref 36.0–46.0)
Hemoglobin: 10.4 g/dL — ABNORMAL LOW (ref 12.0–15.0)
Immature Granulocytes: 1 %
Lymphocytes Relative: 8 %
Lymphs Abs: 1.8 10*3/uL (ref 0.7–4.0)
MCH: 25.1 pg — ABNORMAL LOW (ref 26.0–34.0)
MCHC: 28.5 g/dL — ABNORMAL LOW (ref 30.0–36.0)
MCV: 88 fL (ref 80.0–100.0)
Monocytes Absolute: 1.6 10*3/uL — ABNORMAL HIGH (ref 0.1–1.0)
Monocytes Relative: 7 %
Neutro Abs: 18.8 10*3/uL — ABNORMAL HIGH (ref 1.7–7.7)
Neutrophils Relative %: 84 %
Platelets: 376 10*3/uL (ref 150–400)
RBC: 4.15 MIL/uL (ref 3.87–5.11)
RDW: 17.6 % — ABNORMAL HIGH (ref 11.5–15.5)
WBC: 22.4 10*3/uL — ABNORMAL HIGH (ref 4.0–10.5)
nRBC: 0 % (ref 0.0–0.2)

## 2022-03-07 LAB — RESP PANEL BY RT-PCR (RSV, FLU A&B, COVID)  RVPGX2
Influenza A by PCR: NEGATIVE
Influenza B by PCR: NEGATIVE
Resp Syncytial Virus by PCR: NEGATIVE
SARS Coronavirus 2 by RT PCR: NEGATIVE

## 2022-03-07 LAB — COMPREHENSIVE METABOLIC PANEL
ALT: 10 U/L (ref 0–44)
AST: 12 U/L — ABNORMAL LOW (ref 15–41)
Albumin: 3.3 g/dL — ABNORMAL LOW (ref 3.5–5.0)
Alkaline Phosphatase: 97 U/L (ref 38–126)
Anion gap: 12 (ref 5–15)
BUN: 23 mg/dL (ref 8–23)
CO2: 22 mmol/L (ref 22–32)
Calcium: 9.8 mg/dL (ref 8.9–10.3)
Chloride: 107 mmol/L (ref 98–111)
Creatinine, Ser: 1.56 mg/dL — ABNORMAL HIGH (ref 0.44–1.00)
GFR, Estimated: 38 mL/min — ABNORMAL LOW (ref >60)
Glucose, Bld: 217 mg/dL — ABNORMAL HIGH (ref 70–99)
Potassium: 4.1 mmol/L (ref 3.5–5.1)
Sodium: 141 mmol/L (ref 135–145)
Total Bilirubin: 0.9 mg/dL (ref 0.3–1.2)
Total Protein: 8.2 g/dL — ABNORMAL HIGH (ref 6.5–8.1)

## 2022-03-07 LAB — TROPONIN I (HIGH SENSITIVITY): Troponin I (High Sensitivity): 27 ng/L — ABNORMAL HIGH (ref <18)

## 2022-03-07 LAB — LACTIC ACID, PLASMA: Lactic Acid, Venous: 0.9 mmol/L (ref 0.5–1.9)

## 2022-03-07 MED ORDER — ACETAMINOPHEN 325 MG PO TABS
650.0000 mg | ORAL_TABLET | Freq: Once | ORAL | Status: AC
  Administered 2022-03-07: 650 mg via ORAL
  Filled 2022-03-07: qty 2

## 2022-03-07 NOTE — ED Provider Triage Note (Signed)
Emergency Medicine Provider Triage Evaluation Note  KHALI PERELLA , a 61 y.o. female  was evaluated in triage.  Pt complains of AMS. Patient in group home, staff with patient said she fell last night, now seems altered. Possible head injury. Possible back pain. Subjective fever.  Review of Systems  Positive: Fever, fall, AMS Negative: Cough, emesis  Physical Exam  LMP  (LMP Unknown)  Gen:   Awake, no distress   Resp:  Normal effort. Wheezing MSK:   Moves extremities without difficulty  Other:    Medical Decision Making  Medically screening exam initiated at 7:32 PM.  Appropriate orders placed.  ELIZABETH PAULSEN was informed that the remainder of the evaluation will be completed by another provider, this initial triage assessment does not replace that evaluation, and the importance of remaining in the ED until their evaluation is complete.  AMS, wheezing, fever, fall. Labs, imaging, urine, swab   Darletta Moll, PA-C 03/07/22 1938

## 2022-03-07 NOTE — ED Triage Notes (Signed)
Patient arrived from [facility] with caretaker. Report patient had unwitnessed fall around 0200. States patient was able to get up and eat breakfast normally but after returning from morning activity patient with increased drowsiness and generalized weakness. Patient with labored respirations while sitting in chair, following commands. Speech difficult to understand. Moves all extremities with symmetrical strength. No facial droop noted.

## 2022-03-08 ENCOUNTER — Inpatient Hospital Stay
Admission: EM | Admit: 2022-03-08 | Discharge: 2022-03-11 | DRG: 871 | Disposition: A | Discharge status: Home Health | Payer: Medicare Other | Attending: Family Medicine | Admitting: Family Medicine

## 2022-03-08 ENCOUNTER — Inpatient Hospital Stay: Payer: Medicare Other

## 2022-03-08 ENCOUNTER — Encounter: Payer: Self-pay | Admitting: Family Medicine

## 2022-03-08 DIAGNOSIS — Z794 Long term (current) use of insulin: Secondary | ICD-10-CM | POA: Diagnosis not present

## 2022-03-08 DIAGNOSIS — I5042 Chronic combined systolic (congestive) and diastolic (congestive) heart failure: Secondary | ICD-10-CM | POA: Diagnosis present

## 2022-03-08 DIAGNOSIS — I10 Essential (primary) hypertension: Secondary | ICD-10-CM | POA: Diagnosis not present

## 2022-03-08 DIAGNOSIS — F32A Depression, unspecified: Secondary | ICD-10-CM | POA: Insufficient documentation

## 2022-03-08 DIAGNOSIS — N179 Acute kidney failure, unspecified: Secondary | ICD-10-CM | POA: Diagnosis not present

## 2022-03-08 DIAGNOSIS — E1169 Type 2 diabetes mellitus with other specified complication: Secondary | ICD-10-CM | POA: Diagnosis present

## 2022-03-08 DIAGNOSIS — I2510 Atherosclerotic heart disease of native coronary artery without angina pectoris: Secondary | ICD-10-CM | POA: Diagnosis present

## 2022-03-08 DIAGNOSIS — F200 Paranoid schizophrenia: Secondary | ICD-10-CM | POA: Diagnosis present

## 2022-03-08 DIAGNOSIS — G9341 Metabolic encephalopathy: Secondary | ICD-10-CM | POA: Diagnosis present

## 2022-03-08 DIAGNOSIS — I2699 Other pulmonary embolism without acute cor pulmonale: Secondary | ICD-10-CM | POA: Diagnosis present

## 2022-03-08 DIAGNOSIS — N390 Urinary tract infection, site not specified: Principal | ICD-10-CM

## 2022-03-08 DIAGNOSIS — I5022 Chronic systolic (congestive) heart failure: Secondary | ICD-10-CM | POA: Diagnosis present

## 2022-03-08 DIAGNOSIS — E1122 Type 2 diabetes mellitus with diabetic chronic kidney disease: Secondary | ICD-10-CM | POA: Diagnosis present

## 2022-03-08 DIAGNOSIS — Z7901 Long term (current) use of anticoagulants: Secondary | ICD-10-CM | POA: Diagnosis not present

## 2022-03-08 DIAGNOSIS — A419 Sepsis, unspecified organism: Secondary | ICD-10-CM

## 2022-03-08 DIAGNOSIS — E1165 Type 2 diabetes mellitus with hyperglycemia: Secondary | ICD-10-CM | POA: Diagnosis present

## 2022-03-08 DIAGNOSIS — Z87891 Personal history of nicotine dependence: Secondary | ICD-10-CM | POA: Diagnosis not present

## 2022-03-08 DIAGNOSIS — Y92092 Bedroom in other non-institutional residence as the place of occurrence of the external cause: Secondary | ICD-10-CM | POA: Diagnosis not present

## 2022-03-08 DIAGNOSIS — J9601 Acute respiratory failure with hypoxia: Secondary | ICD-10-CM | POA: Diagnosis present

## 2022-03-08 DIAGNOSIS — F0393 Unspecified dementia, unspecified severity, with mood disturbance: Secondary | ICD-10-CM | POA: Diagnosis present

## 2022-03-08 DIAGNOSIS — N189 Chronic kidney disease, unspecified: Secondary | ICD-10-CM | POA: Diagnosis not present

## 2022-03-08 DIAGNOSIS — Z1152 Encounter for screening for COVID-19: Secondary | ICD-10-CM | POA: Diagnosis not present

## 2022-03-08 DIAGNOSIS — R531 Weakness: Secondary | ICD-10-CM

## 2022-03-08 DIAGNOSIS — N1831 Chronic kidney disease, stage 3a: Secondary | ICD-10-CM | POA: Diagnosis present

## 2022-03-08 DIAGNOSIS — A4150 Gram-negative sepsis, unspecified: Secondary | ICD-10-CM | POA: Diagnosis present

## 2022-03-08 DIAGNOSIS — W06XXXA Fall from bed, initial encounter: Secondary | ICD-10-CM | POA: Diagnosis present

## 2022-03-08 DIAGNOSIS — E785 Hyperlipidemia, unspecified: Secondary | ICD-10-CM | POA: Diagnosis present

## 2022-03-08 DIAGNOSIS — Z79899 Other long term (current) drug therapy: Secondary | ICD-10-CM | POA: Diagnosis not present

## 2022-03-08 DIAGNOSIS — E039 Hypothyroidism, unspecified: Secondary | ICD-10-CM | POA: Diagnosis present

## 2022-03-08 DIAGNOSIS — F419 Anxiety disorder, unspecified: Secondary | ICD-10-CM | POA: Insufficient documentation

## 2022-03-08 DIAGNOSIS — Z811 Family history of alcohol abuse and dependence: Secondary | ICD-10-CM | POA: Diagnosis not present

## 2022-03-08 DIAGNOSIS — I252 Old myocardial infarction: Secondary | ICD-10-CM | POA: Diagnosis not present

## 2022-03-08 DIAGNOSIS — H1030 Unspecified acute conjunctivitis, unspecified eye: Secondary | ICD-10-CM

## 2022-03-08 DIAGNOSIS — I130 Hypertensive heart and chronic kidney disease with heart failure and stage 1 through stage 4 chronic kidney disease, or unspecified chronic kidney disease: Secondary | ICD-10-CM | POA: Diagnosis present

## 2022-03-08 LAB — BASIC METABOLIC PANEL
Anion gap: 11 (ref 5–15)
BUN: 24 mg/dL — ABNORMAL HIGH (ref 8–23)
CO2: 21 mmol/L — ABNORMAL LOW (ref 22–32)
Calcium: 10 mg/dL (ref 8.9–10.3)
Chloride: 109 mmol/L (ref 98–111)
Creatinine, Ser: 1.45 mg/dL — ABNORMAL HIGH (ref 0.44–1.00)
GFR, Estimated: 41 mL/min — ABNORMAL LOW (ref >60)
Glucose, Bld: 192 mg/dL — ABNORMAL HIGH (ref 70–99)
Potassium: 4.1 mmol/L (ref 3.5–5.1)
Sodium: 141 mmol/L (ref 135–145)

## 2022-03-08 LAB — URINALYSIS, ROUTINE W REFLEX MICROSCOPIC
Bacteria, UA: NONE SEEN
Bilirubin Urine: NEGATIVE
Glucose, UA: >=500 mg/dL — AB
Hgb urine dipstick: NEGATIVE
Ketones, ur: 20 mg/dL — AB
Nitrite: NEGATIVE
Protein, ur: 30 mg/dL — AB
Specific Gravity, Urine: 1.01 (ref 1.005–1.030)
Squamous Epithelial / HPF: NONE SEEN (ref 0–5)
WBC, UA: >50 WBC/hpf — ABNORMAL HIGH (ref 0–5)
pH: 6 (ref 5.0–8.0)

## 2022-03-08 LAB — CBC
HCT: 40.2 % (ref 36.0–46.0)
Hemoglobin: 11.7 g/dL — ABNORMAL LOW (ref 12.0–15.0)
MCH: 25.2 pg — ABNORMAL LOW (ref 26.0–34.0)
MCHC: 29.1 g/dL — ABNORMAL LOW (ref 30.0–36.0)
MCV: 86.6 fL (ref 80.0–100.0)
Platelets: 371 10*3/uL (ref 150–400)
RBC: 4.64 MIL/uL (ref 3.87–5.11)
RDW: 17.6 % — ABNORMAL HIGH (ref 11.5–15.5)
WBC: 22.9 10*3/uL — ABNORMAL HIGH (ref 4.0–10.5)
nRBC: 0 % (ref 0.0–0.2)

## 2022-03-08 LAB — GLUCOSE, CAPILLARY
Glucose-Capillary: 115 mg/dL — ABNORMAL HIGH (ref 70–99)
Glucose-Capillary: 155 mg/dL — ABNORMAL HIGH (ref 70–99)

## 2022-03-08 LAB — D-DIMER, QUANTITATIVE: D-Dimer, Quant: 2.14 ug/mL-FEU — ABNORMAL HIGH (ref 0.00–0.50)

## 2022-03-08 LAB — HIV ANTIBODY (ROUTINE TESTING W REFLEX): HIV Screen 4th Generation wRfx: NONREACTIVE

## 2022-03-08 LAB — BRAIN NATRIURETIC PEPTIDE: B Natriuretic Peptide: 369.5 pg/mL — ABNORMAL HIGH (ref 0.0–100.0)

## 2022-03-08 LAB — TROPONIN I (HIGH SENSITIVITY): Troponin I (High Sensitivity): 33 ng/L — ABNORMAL HIGH (ref <18)

## 2022-03-08 LAB — APTT: aPTT: 35 seconds (ref 24–36)

## 2022-03-08 LAB — PROTIME-INR
INR: 1.3 — ABNORMAL HIGH (ref 0.8–1.2)
Prothrombin Time: 15.6 seconds — ABNORMAL HIGH (ref 11.4–15.2)

## 2022-03-08 LAB — CBG MONITORING, ED
Glucose-Capillary: 129 mg/dL — ABNORMAL HIGH (ref 70–99)
Glucose-Capillary: 188 mg/dL — ABNORMAL HIGH (ref 70–99)

## 2022-03-08 MED ORDER — ATORVASTATIN CALCIUM 20 MG PO TABS
20.0000 mg | ORAL_TABLET | Freq: Every day | ORAL | Status: DC
  Administered 2022-03-08 – 2022-03-11 (×4): 20 mg via ORAL
  Filled 2022-03-08 (×4): qty 1

## 2022-03-08 MED ORDER — CARVEDILOL 6.25 MG PO TABS
3.1250 mg | ORAL_TABLET | Freq: Two times a day (BID) | ORAL | Status: DC
  Administered 2022-03-08 – 2022-03-11 (×6): 3.125 mg via ORAL
  Filled 2022-03-08 (×6): qty 1

## 2022-03-08 MED ORDER — SERTRALINE HCL 50 MG PO TABS
100.0000 mg | ORAL_TABLET | Freq: Every day | ORAL | Status: DC
  Administered 2022-03-08 – 2022-03-11 (×4): 100 mg via ORAL
  Filled 2022-03-08 (×4): qty 2

## 2022-03-08 MED ORDER — GUAIFENESIN-DM 100-10 MG/5ML PO SYRP: 5.0000 mL | ORAL_SOLUTION | ORAL | Status: DC | PRN

## 2022-03-08 MED ORDER — SODIUM CHLORIDE 0.9 % IV BOLUS (SEPSIS)
1000.0000 mL | Freq: Once | INTRAVENOUS | Status: AC
  Administered 2022-03-08: 1000 mL via INTRAVENOUS

## 2022-03-08 MED ORDER — GABAPENTIN 100 MG PO CAPS
200.0000 mg | ORAL_CAPSULE | Freq: Three times a day (TID) | ORAL | Status: DC
  Administered 2022-03-08 – 2022-03-11 (×10): 200 mg via ORAL
  Filled 2022-03-08 (×10): qty 2

## 2022-03-08 MED ORDER — INSULIN ASPART 100 UNIT/ML IJ SOLN: 0.0000 [IU] | Freq: Every day | INTRAMUSCULAR | Status: DC

## 2022-03-08 MED ORDER — LEVOTHYROXINE SODIUM 50 MCG PO TABS
25.0000 ug | ORAL_TABLET | Freq: Every day | ORAL | Status: DC
  Administered 2022-03-09 – 2022-03-11 (×3): 25 ug via ORAL
  Filled 2022-03-08 (×3): qty 1

## 2022-03-08 MED ORDER — QUETIAPINE FUMARATE 200 MG PO TABS
600.0000 mg | ORAL_TABLET | Freq: Every day | ORAL | Status: DC
  Administered 2022-03-08 – 2022-03-10 (×3): 600 mg via ORAL
  Filled 2022-03-08 (×3): qty 3

## 2022-03-08 MED ORDER — ENOXAPARIN SODIUM 40 MG/0.4ML IJ SOSY
40.0000 mg | PREFILLED_SYRINGE | INTRAMUSCULAR | Status: DC
  Administered 2022-03-08: 40 mg via SUBCUTANEOUS
  Filled 2022-03-08: qty 0.4

## 2022-03-08 MED ORDER — DONEPEZIL HCL 5 MG PO TABS
10.0000 mg | ORAL_TABLET | Freq: Every day | ORAL | Status: DC
  Administered 2022-03-08 – 2022-03-10 (×3): 10 mg via ORAL
  Filled 2022-03-08 (×3): qty 2

## 2022-03-08 MED ORDER — SODIUM CHLORIDE 0.9 % IV SOLN
2.0000 g | Freq: Once | INTRAVENOUS | Status: AC
  Administered 2022-03-08: 2 g via INTRAVENOUS
  Filled 2022-03-08: qty 12.5

## 2022-03-08 MED ORDER — VANCOMYCIN HCL IN DEXTROSE 1-5 GM/200ML-% IV SOLN: 1000.0000 mg | Freq: Once | INTRAVENOUS | Status: DC

## 2022-03-08 MED ORDER — POLYETHYLENE GLYCOL 3350 17 G PO PACK
17.0000 g | PACK | Freq: Every day | ORAL | Status: DC
  Administered 2022-03-08 – 2022-03-11 (×4): 17 g via ORAL
  Filled 2022-03-08 (×4): qty 1

## 2022-03-08 MED ORDER — FLUPHENAZINE DECANOATE 25 MG/ML IJ SOLN: 31.2500 mg | INTRAMUSCULAR | Status: DC

## 2022-03-08 MED ORDER — APIXABAN 5 MG PO TABS
10.0000 mg | ORAL_TABLET | Freq: Two times a day (BID) | ORAL | Status: DC
  Administered 2022-03-08 – 2022-03-11 (×6): 10 mg via ORAL
  Filled 2022-03-08 (×6): qty 2

## 2022-03-08 MED ORDER — FLUTICASONE PROPIONATE 50 MCG/ACT NA SUSP
2.0000 | Freq: Every day | NASAL | Status: DC
  Administered 2022-03-09 – 2022-03-10 (×2): 2 via NASAL
  Filled 2022-03-08: qty 16

## 2022-03-08 MED ORDER — DOCUSATE SODIUM 100 MG PO CAPS: 100.0000 mg | ORAL_CAPSULE | Freq: Two times a day (BID) | ORAL | Status: DC | PRN

## 2022-03-08 MED ORDER — TECHNETIUM TO 99M ALBUMIN AGGREGATED
4.0000 | Freq: Once | INTRAVENOUS | Status: AC | PRN
  Administered 2022-03-08: 3.99 via INTRAVENOUS

## 2022-03-08 MED ORDER — QUETIAPINE FUMARATE 100 MG PO TABS
100.0000 mg | ORAL_TABLET | Freq: Every day | ORAL | Status: DC
  Administered 2022-03-08 – 2022-03-11 (×4): 100 mg via ORAL
  Filled 2022-03-08 (×4): qty 1

## 2022-03-08 MED ORDER — ACETAMINOPHEN 650 MG RE SUPP: 650.0000 mg | Freq: Four times a day (QID) | RECTAL | Status: DC | PRN

## 2022-03-08 MED ORDER — QUETIAPINE FUMARATE ER 50 MG PO TB24: 100.0000 mg | ORAL_TABLET | Freq: Every day | ORAL | Status: DC

## 2022-03-08 MED ORDER — TRAZODONE HCL 50 MG PO TABS
25.0000 mg | ORAL_TABLET | Freq: Every evening | ORAL | Status: DC | PRN
  Administered 2022-03-10: 25 mg via ORAL
  Filled 2022-03-08: qty 1

## 2022-03-08 MED ORDER — VITAMIN C 500 MG PO TABS
1000.0000 mg | ORAL_TABLET | Freq: Every day | ORAL | Status: DC
  Administered 2022-03-08 – 2022-03-11 (×4): 1000 mg via ORAL
  Filled 2022-03-08 (×4): qty 2

## 2022-03-08 MED ORDER — ONDANSETRON HCL 4 MG/2ML IJ SOLN: 4.0000 mg | Freq: Four times a day (QID) | INTRAMUSCULAR | Status: DC | PRN

## 2022-03-08 MED ORDER — BENZTROPINE MESYLATE 0.5 MG PO TABS
0.5000 mg | ORAL_TABLET | Freq: Two times a day (BID) | ORAL | Status: DC
  Administered 2022-03-08 – 2022-03-11 (×7): 0.5 mg via ORAL
  Filled 2022-03-08 (×7): qty 1

## 2022-03-08 MED ORDER — ONDANSETRON HCL 4 MG PO TABS: 4.0000 mg | ORAL_TABLET | Freq: Four times a day (QID) | ORAL | Status: DC | PRN

## 2022-03-08 MED ORDER — ARIPIPRAZOLE 15 MG PO TABS: 30.0000 mg | ORAL_TABLET | Freq: Every day | ORAL | Status: DC

## 2022-03-08 MED ORDER — INSULIN ASPART 100 UNIT/ML IJ SOLN: 0.0000 [IU] | Freq: Three times a day (TID) | INTRAMUSCULAR | Status: DC

## 2022-03-08 MED ORDER — VANCOMYCIN HCL 1500 MG/300ML IV SOLN
1500.0000 mg | Freq: Once | INTRAVENOUS | Status: AC
  Administered 2022-03-08: 1500 mg via INTRAVENOUS
  Filled 2022-03-08: qty 300

## 2022-03-08 MED ORDER — PANTOPRAZOLE SODIUM 40 MG PO TBEC
40.0000 mg | DELAYED_RELEASE_TABLET | Freq: Every day | ORAL | Status: DC
  Administered 2022-03-08 – 2022-03-11 (×4): 40 mg via ORAL
  Filled 2022-03-08 (×4): qty 1

## 2022-03-08 MED ORDER — ASPIRIN 81 MG PO TBEC
81.0000 mg | DELAYED_RELEASE_TABLET | Freq: Every day | ORAL | Status: DC
  Administered 2022-03-08 – 2022-03-11 (×4): 81 mg via ORAL
  Filled 2022-03-08 (×4): qty 1

## 2022-03-08 MED ORDER — SODIUM CHLORIDE 0.9 % IV SOLN
2.0000 g | INTRAVENOUS | Status: DC
  Administered 2022-03-08 – 2022-03-10 (×3): 2 g via INTRAVENOUS
  Filled 2022-03-08 (×3): qty 20
  Filled 2022-03-08: qty 2

## 2022-03-08 MED ORDER — MAGNESIUM HYDROXIDE 400 MG/5ML PO SUSP: 30.0000 mL | Freq: Every day | ORAL | Status: DC | PRN

## 2022-03-08 MED ORDER — ERYTHROMYCIN 5 MG/GM OP OINT
TOPICAL_OINTMENT | Freq: Four times a day (QID) | OPHTHALMIC | Status: DC
  Administered 2022-03-08 – 2022-03-11 (×9): 1 via OPHTHALMIC
  Filled 2022-03-08 (×3): qty 1

## 2022-03-08 MED ORDER — ACETAMINOPHEN 325 MG PO TABS: 650.0000 mg | ORAL_TABLET | Freq: Four times a day (QID) | ORAL | Status: DC | PRN

## 2022-03-08 MED ORDER — SENNA 8.6 MG PO TABS
1.0000 | ORAL_TABLET | Freq: Every day | ORAL | Status: DC
  Administered 2022-03-08 – 2022-03-11 (×4): 8.6 mg via ORAL
  Filled 2022-03-08 (×4): qty 1

## 2022-03-08 MED ORDER — METRONIDAZOLE 500 MG/100ML IV SOLN
500.0000 mg | Freq: Once | INTRAVENOUS | Status: AC
  Administered 2022-03-08: 500 mg via INTRAVENOUS
  Filled 2022-03-08: qty 100

## 2022-03-08 MED ORDER — APIXABAN 5 MG PO TABS: 5.0000 mg | ORAL_TABLET | Freq: Two times a day (BID) | ORAL | Status: DC

## 2022-03-08 MED ORDER — INSULIN ASPART 100 UNIT/ML IJ SOLN
0.0000 [IU] | Freq: Three times a day (TID) | INTRAMUSCULAR | Status: DC
  Administered 2022-03-08: 3 [IU] via SUBCUTANEOUS
  Administered 2022-03-08: 2 [IU] via SUBCUTANEOUS
  Administered 2022-03-09: 5 [IU] via SUBCUTANEOUS
  Administered 2022-03-09: 2 [IU] via SUBCUTANEOUS
  Administered 2022-03-09: 5 [IU] via SUBCUTANEOUS
  Administered 2022-03-10: 2 [IU] via SUBCUTANEOUS
  Administered 2022-03-10 – 2022-03-11 (×2): 5 [IU] via SUBCUTANEOUS
  Filled 2022-03-08 (×8): qty 1

## 2022-03-08 MED ORDER — SODIUM CHLORIDE 0.9 % IV SOLN: INTRAVENOUS | Status: DC

## 2022-03-08 NOTE — Progress Notes (Signed)
CODE SEPSIS - PHARMACY COMMUNICATION  **Broad Spectrum Antibiotics should be administered within 1 hour of Sepsis diagnosis**  Time Code Sepsis Called/Page Received: 12/19 @ 0239   Antibiotics Ordered: Cefepime, Vanc, Flagyl   Time of 1st antibiotic administration: Cefepime 2 gm IV X 1 on 12/19 @ 0336  Additional action taken by pharmacy:   If necessary, Name of Provider/Nurse Contacted:     Regana Kemple D ,PharmD Clinical Pharmacist  03/08/2022  4:23 AM

## 2022-03-08 NOTE — ED Notes (Signed)
Pt placed on purewick 

## 2022-03-08 NOTE — Assessment & Plan Note (Addendum)
-   The patient will be admitted to a medical telemetry bed. - Her sepsis manifested by significant leukocytosis, tachycardia and tachypnea and fever. - She will be placed on IV Rocephin. - We will continue hydration with IV normal saline. - We will follow urine and blood cultures. - Her lactic acid was within normal.

## 2022-03-08 NOTE — ED Notes (Signed)
Continued delay on administration of medications while waiting for verification from pharmacy

## 2022-03-08 NOTE — Assessment & Plan Note (Signed)
-   This is manifested by thick eye discharge. - This should be covered with IV Rocephin and will add erythromycin eye ointment and moist scrubs.

## 2022-03-08 NOTE — Assessment & Plan Note (Signed)
-   This could be related to her sepsis. - Her chest x-ray was unremarkable - We will obtain a D-dimer for now and if elevated depending on her creatinine a chest CTA can be obtained or VQ scan. - O2 protocol will be followed.

## 2022-03-08 NOTE — Sepsis Progress Note (Signed)
Following for sepsis monitoring ?

## 2022-03-08 NOTE — Progress Notes (Signed)
Notified RN that medications will be verified and re-timed after fax is received from the patient's group home

## 2022-03-08 NOTE — Assessment & Plan Note (Signed)
-   This is superimposed on stage IIIa chronic kidney disease. - The patient will be hydrated with IV normal saline. - Will follow BMP. - We will avoid nephrotoxins.

## 2022-03-08 NOTE — Progress Notes (Addendum)
Progress Note   Patient: Elizabeth Gutierrez KYH:[id number] DOB: 16-Jun-1960 DOA: 03/08/2022     0 DOS: the patient was seen and examined on 03/08/2022   Brief hospital course: Elizabeth Gutierrez is a 61 y.o. female with medical history significant for systolic CHF, coronary artery disease, type 2 diabetes mellitus, hypertension, dyslipidemia, paranoid schizophrenia and hypothyroidism, who presented to the emergency room with acute onset of altered mental status and generalized weakness with subsequent fall.  She was having fever 100.6.  UA had moderate white cells, was started on Rocephin for UTI. Patient also had some hypoxemia with oxygen saturation 87%, was placed on 2 L oxygen.  VQ scan was performed to rule out PE.  Assessment and Plan: * Sepsis due to gram-negative UTI ([place]) Urinary tract infection. Acute metabolic encephalopathy secondary to UTI. Patient met sepsis criteria with tachycardia, fever, tachypnea and leukocytosis.  No lactic acidosis.  This was felt to be secondary to urinary tract infection. Patient also had altered mental status from UTI. Patient is hemodynamically stable.  Continue Rocephin.   Generalized weakness PT/OT.  Chronic kidney disease stage IIIa. Acute kidney injury superimposed on chronic kidney disease ([place]) ruled out Reviewed patient prior lab results, patient has chronic kidney stage IIIa, renal function still stable, no AKI.  Acute respiratory failure with hypoxia ([facility]) Chronic combined systolic diastolic congestive heart failure. Acute pulmonary embolism. Patient has a D-dimer elevation, will obtain VQ scan as patient has chronic kidney disease. The patient echocardiogram performed recently in 2022, ejection fraction 30 to 35% with grade 1 diastolic dysfunction. Patient had a short of breath and hypoxemia, this could be due to congestive heart failure.  Check a BNP.  X-ray did not show any evidence of congestive heart of exacerbation. Perfusion study showed  segmental PE, start eliquis.   Acute conjunctivitis - This is manifested by thick eye discharge. - This should be covered with IV Rocephin and will add erythromycin eye ointment and moist scrubs.  Essential hypertension Symptoms.  Type 2 diabetes. Check A1c, continue sliding scale insulin.   Anxiety and depression - We will continue her Zoloft, Seroquel and Valium.  Hypothyroidism - We will continue her Synthroid.  Paranoid schizophrenia ([place]) Mild mental retardation. - We will continue her psychotropic medications.      Subjective: Patient still sleepy, on 2L oxygen.  Does not feel short of breath.  Physical Exam: Vitals:   03/08/22 1139 03/08/22 1200 03/08/22 1300 03/08/22 1328  BP: 123/68 113/75    Pulse: (!) 101 (!) 105 99 96  Resp: 20 15 (!) 24 (!) 25  Temp:      TempSrc:      SpO2: 90% 91% 95% 95%  Weight:      Height:       General exam: Appears calm and comfortable  Respiratory system: Clear to auscultation. Respiratory effort normal. Cardiovascular system: S1 & S2 heard, RRR. No JVD, murmurs, rubs, gallops or clicks. No pedal edema. Gastrointestinal system: Abdomen is nondistended, soft and nontender. No organomegaly or masses felt. Normal bowel sounds heard. Central nervous system: Alert and oriented x2. No focal neurological deficits. Extremities: Symmetric 5 x 5 power. Skin: No rashes, lesions or ulcers Psychiatry: Judgement and insight appear normal. Mood & affect appropriate.   Data Reviewed:  Reviewed chest x-ray, lab results.  Family Communication: Discussed with group home staff.  Disposition: Status is: Inpatient Remains inpatient appropriate because: Severity of disease, IV treatment.  Planned Discharge Destination: Home with Home Health  Time spent: 35 minutes, no charge  Author: Sharen Hones, MD 03/08/2022 2:22 PM  For on call review [email or web address].

## 2022-03-08 NOTE — Hospital Course (Signed)
Elizabeth Gutierrez is a 61 y.o. female with medical history significant for systolic CHF, coronary artery disease, type 2 diabetes mellitus, hypertension, dyslipidemia, paranoid schizophrenia and hypothyroidism, who presented to the emergency room with acute onset of altered mental status and generalized weakness with subsequent fall.  She was having fever 100.6.  UA had moderate white cells, was started on Rocephin for UTI. Patient also had some hypoxemia with oxygen saturation 87%, was placed on 2 L oxygen.  VQ scan was performed to rule out PE.

## 2022-03-08 NOTE — Assessment & Plan Note (Signed)
-   We will continue her Synthroid. 

## 2022-03-08 NOTE — Assessment & Plan Note (Signed)
-   We will continue her psychotropic medications.

## 2022-03-08 NOTE — ED Provider Notes (Signed)
[facility] Provider Note    Event Date/Time   First MD Initiated Contact with Patient 03/08/22 [phone]     (approximate)  History   Chief Complaint: Altered Mental Status  HPI  Elizabeth Gutierrez is a 61 y.o. female patient is from [facility] accompanied by her caregiver reported weakness fall fever and fatigue today.  According to the caregiver since yesterday morning the patient has been weak she had a fall slipping out of bed around 2 AM yesterday (12/18) she has been very weak fatigued sleeping a considerable amount which is very atypical per caregiver.  She is also noticed quite a bit of discharge around both of her eyes.  No reported cough vomiting or diarrhea.  Patient is special needs and cannot contribute much to her history although appears calm and cooperative does not appear to be in any distress or in any discomfort.  Physical Exam   Triage Vital Signs: ED Triage Vitals  Enc Vitals Group     BP 03/07/22 1938 111/78     Pulse Rate 03/07/22 1938 (!) 116     Resp 03/07/22 1938 (!) 22     Temp 03/07/22 1938 (!) 100.6 F (38.1 C)     Temp Source 03/07/22 1938 Oral     SpO2 03/07/22 1938 92 %     Weight 03/07/22 1939 150 lb (68 kg)     Height 03/07/22 1939 6' (1.829 m)     Head Circumference --      Peak Flow --      Pain Score --      Pain Loc --      Pain Edu? --      Excl. in [place]? --     Most recent vital signs: Vitals:   03/07/22 1938 03/07/22 2352  BP: 111/78 (!) 130/93  Pulse: (!) 116 (!) 106  Resp: (!) 22 18  Temp: (!) 100.6 F (38.1 C) 98 F (36.7 C)  SpO2: 92% 95%    General: Awake, no distress.  CV:  Good peripheral perfusion.  Regular rate and rhythm  Resp:  Normal effort.  Equal breath sounds bilaterally.  Abd:  No distention.  Soft, nontender.  No rebound or guarding.   ED Results / Procedures / Treatments   EKG  EKG viewed and interpreted by myself shows sinus tachycardia at 105 bpm with a widened QRS, left  axis deviation, prolonged PR interval with slight QTc prolongation, nonspecific ST changes.  RADIOLOGY  I have reviewed and interpreted CT head images.  No obvious intracranial bleed seen on my evaluation. Radiologist read the CT scan is negative. CT C-spine negative Lumbar x-ray negative Pelvis x-ray negative Bilateral femur x-rays negative Chest x-ray negative   MEDICATIONS ORDERED IN ED: Medications  sodium chloride 0.9 % bolus 1,000 mL (has no administration in time range)  ceFEPIme (MAXIPIME) 2 g in sodium chloride 0.9 % 100 mL IVPB (has no administration in time range)  metroNIDAZOLE (FLAGYL) IVPB 500 mg (has no administration in time range)  vancomycin (VANCOCIN) IVPB 1000 mg/200 mL premix (has no administration in time range)  acetaminophen (TYLENOL) tablet 650 mg (650 mg Oral Given 03/07/22 1947)     IMPRESSION / MDM / ASSESSMENT AND PLAN / ED COURSE  I reviewed the triage vital signs and the nursing notes.  Patient's presentation is most consistent with acute presentation with potential threat to life or bodily function.  Patient presents emergency department for increased weakness, a  fall, fatigue found to be febrile.  Patient is special needs and cannot contribute to her history.  Patient found to be febrile and tachycardic meeting SIRS criteria.  Lab work has resulted showing significant leukocytosis of 22,000 otherwise reassuring CBC, chemistry shows mild hyperglycemia with mild renal insufficiency.  Troponin is slightly elevated urinalysis has resulted showing greater than 50 white blood cells with white blood cell clumps likely indicating urinary tract infection.  Given the patient's elevated white blood cell count initial tachycardia and fever concerning for sepsis patient has been covered with broad-spectrum antibiotics.  Patient's COVID/flu/RSV is negative.  Reassuringly patient is lactic acid is normal.  Patient appears well throughout my evaluation.  We will admit to  the hospital service for further workup and treatment.  CRITICAL CARE Performed by: Harvest Dark   Total critical care time: 30 minutes  Critical care time was exclusive of separately billable procedures and treating other patients.  Critical care was necessary to treat or prevent imminent or life-threatening deterioration.  Critical care was time spent personally by me on the following activities: development of treatment plan with patient and/or surrogate as well as nursing, discussions with consultants, evaluation of patient's response to treatment, examination of patient, obtaining history from patient or surrogate, ordering and performing treatments and interventions, ordering and review of laboratory studies, ordering and review of radiographic studies, pulse oximetry and re-evaluation of patient's condition.   FINAL CLINICAL IMPRESSION(S) / ED DIAGNOSES   Sepsis Urinary tract infection   Note:  This document was prepared using Dragon voice recognition software and may include unintentional dictation errors.   Harvest Dark, MD 03/08/22 [id number]

## 2022-03-08 NOTE — Assessment & Plan Note (Signed)
-   This is likely secondary to #1 and is likely the cause for her fall. - Management as above. - PT evaluation will be obtained.

## 2022-03-08 NOTE — H&P (Addendum)
Elizabeth Gutierrez   PATIENT NAME: Elizabeth Gutierrez    MR#:  [id number]  DATE OF BIRTH:  [birth year]  DATE OF ADMISSION:  03/08/2022  PRIMARY CARE PHYSICIAN: Denton Lank, MD   Patient is coming from: Home  REQUESTING/REFERRING PHYSICIAN: Harvest Dark, MD  CHIEF COMPLAINT:   Chief Complaint  Patient presents with   Altered Mental Status    HISTORY OF PRESENT ILLNESS:  Elizabeth Gutierrez is a 61 y.o. female with medical history significant for systolic CHF, coronary artery disease, type 2 diabetes mellitus, hypertension, dyslipidemia, paranoid schizophrenia and hypothyroidism, who presented to the emergency room with acute onset of altered mental status and generalized weakness with subsequent fall.  She was having fever 100.6.  According to her caregiver at her group home she has been weak and had a fall slipping out of bed at around 2 AM yesterday and has been fairly sleepy for considerable amount of time that is atypical for her.  She admits to urinary frequency and urgency.  She was noted to have mild discharge around both her eyes.  No nausea or vomiting or diarrhea.  No cough or wheezing or dyspnea.  The patient is a none historian and history was given by her caregiver.  ED Course: When she came to the ER, temperature was 100.6 with heart rate of 116 and respiratory to of 22 and Pulsoxymeter was 92% on room air and later 97% on 2-3 L O2.  Labs revealed a creatinine of 1.56 compared to 1.22 on 217/2023 with albumin 3.3.  Lactic acid was 0.9 and CBC showed leukocytosis of 22.4 with neutrophilia.  Influenza antigens and COVID-19 PCR as well as RSV PCR came back negative.  UA was positive for UTI and showed more than 500 glucose and 20 ketones. EKG as reviewed by me : EKG showed sinus tachycardia with rate 110 with occasional PVCs and right bundle branch block with T wave inversion anteroseptally Imaging: Portable chest x-ray showed no acute cardiopulmonary disease.  It did show  cardiomegaly.  She had a two-view bilateral femur x-ray that came back negative she had a lumbar spine x-ray that was negative as well as a negative pelvic x-ray.  She had a noncontrasted head CT scan that showed no acute intracranial normalities.  It did show mild small vessel ischemic disease.  C-spine CT showed mild degenerative changes at C5-6 with no traumatic injury.  The patient was given broad-spectrum antibiotic therapy initially with IV cefepime, Flagyl and vancomycin as well as p.o. Tylenol and 1 L bolus of IV normal saline.  She will be admitted to a medical telemetry bed for further evaluation and management. PAST MEDICAL HISTORY:   Past Medical History:  Diagnosis Date   CHF (congestive heart failure) ([place])    Coronary artery disease    Diabetes ([place])    Heart attack ([place])    Hyperlipidemia    Hypertension    MR (mental retardation)    Mild   Paranoid schizophrenia ([place])    Thyroid disease     PAST SURGICAL HISTORY:   Past Surgical History:  Procedure Laterality Date   FOOT SURGERY Right     SOCIAL HISTORY:   Social History   Tobacco Use   Smoking status: Former    Types: Cigarettes    Quit date: 07/08/2013    Years since quitting: 8.6   Smokeless tobacco: Former  Substance Use Topics   Alcohol use: No    Alcohol/week: 0.0 standard drinks of  alcohol    FAMILY HISTORY:   Family History  Problem Relation Age of Onset   Alcohol abuse Father    Breast cancer Neg Hx     DRUG ALLERGIES:   Allergies  Allergen Reactions   Bee Venom     REVIEW OF SYSTEMS:   ROS As per history of present illness. All pertinent systems were reviewed above. Constitutional, HEENT, cardiovascular, respiratory, GI, GU, musculoskeletal, neuro, psychiatric, endocrine, integumentary and hematologic systems were reviewed and are otherwise negative/unremarkable except for positive findings mentioned above in the HPI.   MEDICATIONS AT HOME:   Prior to Admission medications    Medication Sig Start Date End Date Taking? Authorizing Provider  acetaminophen (TYLENOL) 325 MG tablet Take 650 mg by mouth every 4 (four) hours as needed for moderate pain, fever or headache.     [provider]  ARIPiprazole (ABILIFY) 30 MG tablet Take 1 tablet (30 mg total) by mouth at bedtime. 09/21/19   Hongalgi, Lenis Dickinson, MD  ascorbic acid (VITAMIN C) 1000 MG tablet Take 1 tablet by mouth daily.    [provider]  aspirin 81 MG tablet Take 81 mg by mouth daily.    [provider]  atorvastatin (LIPITOR) 20 MG tablet Take 20 mg by mouth daily.    [provider]  BELSOMRA 15 MG TABS Take 1 tablet by mouth at bedtime. 05/05/21   [provider]  benztropine (COGENTIN) 0.5 MG tablet Take 0.5 mg by mouth 2 (two) times daily.    [provider]  carvedilol (COREG) 3.125 MG tablet Take 3.125 mg by mouth 2 (two) times daily. 12/26/18   [provider]  dextromethorphan-guaiFENesin (ROBITUSSIN-DM) 10-100 MG/5ML liquid Take by mouth every 4 (four) hours as needed for cough.    [provider]  docusate sodium (COLACE) 100 MG capsule Take 100 mg by mouth 2 (two) times daily as needed for mild constipation.     [provider]  donepezil (ARICEPT) 10 MG tablet Take 1 tablet by mouth at bedtime. 04/07/21   [provider]  fluPHENAZine decanoate (PROLIXIN) 25 MG/ML injection Inject 31.25 mg into the muscle every 14 (fourteen) days.     [provider]  fluticasone (FLONASE) 50 MCG/ACT nasal spray Place 2 sprays into both nostrils daily.     [provider]  furosemide (LASIX) 20 MG tablet Take 1 tablet (20 mg total) by mouth daily as needed for fluid or edema. 10/01/19 09/30/20  Jennye Boroughs, MD  gabapentin (NEURONTIN) 100 MG capsule Take 200 mg by mouth 3 (three) times daily.    [provider]  ibuprofen (ADVIL) 600 MG tablet Take 600 mg by mouth every 6 (six) hours as needed.    [provider]  Inositol 500 MG TABS Take 500 mg by mouth in the morning and at bedtime.    [provider]  ketoconazole (NIZORAL) 2 % cream Apply 1 application topically in the morning and at bedtime. 04/08/21   [provider]  levothyroxine (SYNTHROID) 25 MCG tablet Take 1 tablet (25 mcg total) by mouth daily before breakfast. 12/07/19   Enzo Bi, MD  lithium carbonate (LITHOBID) 300 MG CR tablet Take 300 mg by mouth every 12 (twelve) hours.  Patient not taking: Reported on 05/07/2021    [provider]  liver oil-zinc oxide (DESITIN) 40 % ointment Apply topically as needed for irritation. Patient not taking: Reported on 05/07/2021 12/07/19   Enzo Bi, MD  metFORMIN ([place]) 500  MG 24 hr tablet Take 500 mg by mouth daily with breakfast.    [provider]  nystatin cream (MYCOSTATIN) Apply 1 application topically 2 (two) times daily. Patient not taking: Reported on 05/07/2021 12/07/19   Enzo Bi, MD  omeprazole (PRILOSEC) 20 MG capsule Take 20 mg by mouth 2 (two) times daily.  12/26/18   [provider]  polyethylene glycol (MIRALAX / GLYCOLAX) 17 g packet Take 17 g by mouth daily.     [provider]  QUEtiapine (SEROQUEL XR) 50 MG TB24 24 hr tablet Take 50 mg by mouth in the morning.    [provider]  senna (SENOKOT) 8.6 MG tablet Take 1 tablet by mouth daily.    [provider]  SEROQUEL XR 400 MG 24 hr tablet Take 400 mg by mouth at bedtime. 04/29/21   [provider]  SEROQUEL XR 50 MG TB24 24 hr tablet Take 2 tablets by mouth at bedtime. 04/29/21   [provider]  sertraline (ZOLOFT) 100 MG tablet Take 100 mg by mouth in the morning.     [provider]  traZODone (DESYREL) 50 MG tablet Take 1 tablet (50 mg total) by mouth at bedtime. Patient not taking: Reported on 05/07/2021 08/19/16   Loletha Grayer, MD  zinc sulfate 220 (50 Zn) MG capsule Take 220 mg by mouth daily.    [provider]      VITAL SIGNS:  Blood pressure 121/78, pulse 100, temperature 97.9 F (36.6 C), temperature source Oral, resp. rate 16, height 6' (1.829 m), weight 68 kg, SpO2 (!) 87 %.  PHYSICAL EXAMINATION:  Physical Exam  GENERAL:  61 y.o.-year-old patient lying in the bed with no acute distress.  EYES: Pupils equal, round, reactive to light and accommodation. No scleral icterus.  She has bilateral thick eye discharge with glued eyelashes secondarily.  Extraocular muscles intact.  HEENT: Head atraumatic, normocephalic. Oropharynx and nasopharynx clear.  NECK:  Supple, no jugular venous distention. No thyroid enlargement, no tenderness.  LUNGS: Normal breath sounds bilaterally, no wheezing, rales,rhonchi or crepitation. No use of accessory muscles of respiration.  CARDIOVASCULAR: Regular rate and rhythm, S1, S2 normal. No murmurs, rubs, or gallops.  ABDOMEN: Soft, nondistended, nontender. Bowel sounds present. No organomegaly or mass.  EXTREMITIES: No pedal edema, cyanosis, or clubbing.  NEUROLOGIC: Cranial nerves II through XII are intact. Muscle strength 5/5 in all extremities. Sensation intact. Gait not checked.  PSYCHIATRIC: The patient is alert and oriented x 3.  Normal affect and good eye contact. SKIN: No obvious rash, lesion, or ulcer.   LABORATORY PANEL:   CBC Recent Labs  Lab 03/07/22 1944  WBC 22.4*  HGB 10.4*  HCT 36.5  PLT 376   ------------------------------------------------------------------------------------------------------------------  Chemistries  Recent Labs  Lab 03/07/22 1944  NA 141  K 4.1  CL 107  CO2 22  GLUCOSE 217*  BUN 23  CREATININE 1.56*  CALCIUM 9.8  AST 12*  ALT 10  ALKPHOS 97  BILITOT 0.9   ------------------------------------------------------------------------------------------------------------------  Cardiac Enzymes No results for input(s): "TROPONINI" in the last 168  hours. ------------------------------------------------------------------------------------------------------------------  RADIOLOGY:  CT Head Wo Contrast  Result Date: 03/07/2022 CLINICAL DATA:  Fall, back pain EXAM: CT HEAD WITHOUT CONTRAST CT CERVICAL SPINE WITHOUT CONTRAST TECHNIQUE: Multidetector CT imaging of the head and cervical spine was performed following the standard protocol without intravenous contrast. Multiplanar CT image reconstructions of the cervical spine were also generated. RADIATION DOSE REDUCTION: This exam was performed according to the  departmental dose-optimization program which includes automated exposure control, adjustment of the mA and/or kV according to patient size and/or use of iterative reconstruction technique. COMPARISON:  CT head dated 05/07/2021 FINDINGS: CT HEAD FINDINGS Brain: No evidence of acute infarction, hemorrhage, hydrocephalus, extra-axial collection or mass lesion/mass effect. Mild subcortical white matter and periventricular small vessel ischemic changes. Vascular: Intracranial atherosclerosis. Skull: Normal. Negative for fracture or focal lesion. Sinuses/Orbits: The visualized paranasal sinuses are essentially clear. The mastoid air cells are unopacified. Other: None. CT CERVICAL SPINE FINDINGS Alignment: Normal cervical lordosis. Skull base and vertebrae: No acute fracture. No primary bone lesion or focal pathologic process. Soft tissues and spinal canal: No prevertebral fluid or swelling. No visible canal hematoma. Disc levels: Mild degenerative changes at C5-6. Spinal canal is patent. Upper chest: Visualized lung apices are clear. Other: Small bilateral thyroid nodules measuring up to 10 mm, likely reflecting multinodular goiter. No follow-up is recommended. IMPRESSION: No evidence of acute intracranial abnormality. Mild small vessel ischemic changes. No traumatic injury to the cervical spine. Mild degenerative changes at C5-6. Electronically Signed   By:  Julian Hy M.D.   On: 03/07/2022 20:34   CT Cervical Spine Wo Contrast  Result Date: 03/07/2022 CLINICAL DATA:  Fall, back pain EXAM: CT HEAD WITHOUT CONTRAST CT CERVICAL SPINE WITHOUT CONTRAST TECHNIQUE: Multidetector CT imaging of the head and cervical spine was performed following the standard protocol without intravenous contrast. Multiplanar CT image reconstructions of the cervical spine were also generated. RADIATION DOSE REDUCTION: This exam was performed according to the departmental dose-optimization program which includes automated exposure control, adjustment of the mA and/or kV according to patient size and/or use of iterative reconstruction technique. COMPARISON:  CT head dated 05/07/2021 FINDINGS: CT HEAD FINDINGS Brain: No evidence of acute infarction, hemorrhage, hydrocephalus, extra-axial collection or mass lesion/mass effect. Mild subcortical white matter and periventricular small vessel ischemic changes. Vascular: Intracranial atherosclerosis. Skull: Normal. Negative for fracture or focal lesion. Sinuses/Orbits: The visualized paranasal sinuses are essentially clear. The mastoid air cells are unopacified. Other: None. CT CERVICAL SPINE FINDINGS Alignment: Normal cervical lordosis. Skull base and vertebrae: No acute fracture. No primary bone lesion or focal pathologic process. Soft tissues and spinal canal: No prevertebral fluid or swelling. No visible canal hematoma. Disc levels: Mild degenerative changes at C5-6. Spinal canal is patent. Upper chest: Visualized lung apices are clear. Other: Small bilateral thyroid nodules measuring up to 10 mm, likely reflecting multinodular goiter. No follow-up is recommended. IMPRESSION: No evidence of acute intracranial abnormality. Mild small vessel ischemic changes. No traumatic injury to the cervical spine. Mild degenerative changes at C5-6. Electronically Signed   By: Julian Hy M.D.   On: 03/07/2022 20:34   DG Chest 1 View  Result  Date: 03/07/2022 CLINICAL DATA:  Fall, back pain EXAM: CHEST  1 VIEW COMPARISON:  06/30/2020 FINDINGS: Lungs are clear.  No pleural effusion or pneumothorax. Cardiomegaly. IMPRESSION: Cardiomegaly. No evidence of acute cardiopulmonary disease. Electronically Signed   By: Julian Hy M.D.   On: 03/07/2022 20:32   DG Lumbar Spine 2-3 Views  Result Date: 03/07/2022 CLINICAL DATA:  Fall EXAM: LUMBAR SPINE - 2-3 VIEW COMPARISON:  Lumbar spine x-ray 12/01/2014 FINDINGS: There is no evidence of lumbar spine fracture. Alignment is normal. Intervertebral disc spaces are maintained. IMPRESSION: Negative. Electronically Signed   By: Ronney Asters M.D.   On: 03/07/2022 20:31   DG Femur Min 2 Views Right  Result Date: 03/07/2022 CLINICAL DATA:  Fall EXAM: RIGHT FEMUR 2 VIEWS COMPARISON:  None Available. FINDINGS: No fracture or dislocation is seen. Joint spaces are preserved. Visualized soft tissues are within normal limits. IMPRESSION: Negative. Electronically Signed   By: Julian Hy M.D.   On: 03/07/2022 20:31   DG Femur Min 2 Views Left  Result Date: 03/07/2022 CLINICAL DATA:  Fall EXAM: LEFT FEMUR 2 VIEWS COMPARISON:  None Available. FINDINGS: No fracture or dislocation is seen. Hip joint space is preserved.  Degenerative changes of the knee. Visualized soft tissues are within normal limits. IMPRESSION: Negative. Electronically Signed   By: Julian Hy M.D.   On: 03/07/2022 20:31   DG Pelvis 1-2 Views  Result Date: 03/07/2022 CLINICAL DATA:  Fall, back pain EXAM: PELVIS - 1-2 VIEW COMPARISON:  None Available. FINDINGS: No fracture or dislocation is seen. Bilobed joint spaces are preserved. Visualized bony pelvis appears intact. IMPRESSION: Negative. Electronically Signed   By: Julian Hy M.D.   On: 03/07/2022 20:30      IMPRESSION AND PLAN:  Assessment and Plan: * Sepsis due to gram-negative UTI [facility]) - The patient will be admitted to a medical telemetry bed. - Her  sepsis manifested by significant leukocytosis, tachycardia and tachypnea and fever. - She will be placed on IV Rocephin. - We will continue hydration with IV normal saline. - We will follow urine and blood cultures. - Her lactic acid was within normal.  Generalized weakness - This is likely secondary to #1 and is likely the cause for her fall. - Management as above. - PT evaluation will be obtained.  Acute kidney injury superimposed on chronic kidney disease ([place]) - This is superimposed on stage IIIa chronic kidney disease. - The patient will be hydrated with IV normal saline. - Will follow BMP. - We will avoid nephrotoxins.  Acute respiratory failure with hypoxia ([facility]) - This could be related to her sepsis. - Her chest x-ray was unremarkable - We will obtain a D-dimer for now and if elevated depending on her creatinine a chest CTA can be obtained or VQ scan. - O2 protocol will be followed.  Acute conjunctivitis - This is manifested by thick eye discharge. - This should be covered with IV Rocephin and will add erythromycin eye ointment and moist scrubs.  Essential hypertension - We will continue her antihypertensives while holding off nephrotoxins.  Anxiety and depression - We will continue her Zoloft, Seroquel and Valium.  Hypothyroidism - We will continue her Synthroid.  Paranoid schizophrenia ([place]) - We will continue her psychotropic medications.    DVT prophylaxis: Lovenox.  Advanced Care Planning:  Code Status: full code.  Family Communication:  The plan of care was discussed in details with the patient (and family). I answered all questions. The patient agreed to proceed with the above mentioned plan. Further management will depend upon hospital course. Disposition Plan: Back to previous home environment Consults called: none.  All the records are reviewed and case discussed with ED provider.  Status is: Inpatient    At the time of the admission, it appears  that the appropriate admission status for this patient is inpatient.  This is judged to be reasonable and necessary in order to provide the required intensity of service to ensure the patient's safety given the presenting symptoms, physical exam findings and initial radiographic and laboratory data in the context of comorbid conditions.  The patient requires inpatient status due to high intensity of service, high risk of further deterioration and high frequency of surveillance required.  I certify that at the time of  admission, it is my clinical judgment that the patient will require inpatient hospital care extending more than 2 midnights.                            Dispo: The patient is from: Home              Anticipated d/c is to: Home              Patient currently is not medically stable to d/c.              Difficult to place patient: No  Christel Mormon M.D on 03/08/2022 at 7:24 AM  [facility]   From 7 PM-7 AM, contact night-coverage [email or web address]  CC: Primary care physician; Denton Lank, MD

## 2022-03-08 NOTE — Assessment & Plan Note (Signed)
-   We will continue her Zoloft, Seroquel and Valium.

## 2022-03-08 NOTE — ED Notes (Signed)
1st set of BC from IV start Left AC and 2nd set from IV start right wrist

## 2022-03-08 NOTE — Assessment & Plan Note (Signed)
-   We will continue her antihypertensives while holding off nephrotoxins.

## 2022-03-08 NOTE — Consult Note (Signed)
ANTICOAGULATION CONSULT NOTE - Initial Consult  Pharmacy Consult for apixaban Indication: pulmonary embolus  Allergies  Allergen Reactions   Bee Venom     Patient Measurements: Height: 6' (182.9 cm) Weight: 68 kg (150 lb) IBW/kg (Calculated) : 73.1   Vital Signs: Temp: 97.7 F (36.5 C) (12/19 0731) Temp Source: Oral (12/19 0731) BP: 128/65 (12/19 1400) Pulse Rate: 111 (12/19 1400)  Labs: Recent Labs    03/07/22 1944 03/07/22 2357 03/08/22 0347  HGB 10.4*  --   --   HCT 36.5  --   --   PLT 376  --   --   APTT  --   --  35  LABPROT  --   --  15.6*  INR  --   --  1.3*  CREATININE 1.56* 1.45*  --   TROPONINIHS 27* 33*  --     Estimated Creatinine Clearance: 43.7 mL/min (A) (by C-G formula based on SCr of 1.45 mg/dL (H)).   Medical History: Past Medical History:  Diagnosis Date   CHF (congestive heart failure) ([place])    Coronary artery disease    Diabetes ([place])    Heart attack ([place])    Hyperlipidemia    Hypertension    MR (mental retardation)    Mild   Paranoid schizophrenia ([place])    Thyroid disease     Medications:  Enoxaparin 40 mg SQ daily. Last dose 12/19 0900  Assessment: 61 y.o. female with medical history significant for systolic CHF, coronary artery disease presented to the emergency room  03/08/22 with acute onset of altered mental status and generalized weakness and hypoxemia. Found to have PE.  Goal of Therapy:  Monitor platelets by anticoagulation protocol: Yes   Plan:  Discontinue SubQ enoxaparin Initiate apixaban 10 mg BID x 7 days followed by 5 mg BID thereafter   Dorothe Pea, PharmD, BCPS Clinical Pharmacist   03/08/2022,4:45 PM

## 2022-03-08 NOTE — ED Notes (Signed)
Delayed in administration of some medications due to them not being verified by pharmacy as of yet.

## 2022-03-08 NOTE — Progress Notes (Signed)
PHARMACY -  BRIEF ANTIBIOTIC NOTE   Pharmacy has received consult(s) for Vancomycin, Cefepime from an ED provider.  The patient's profile has been reviewed for ht/wt/allergies/indication/available labs.    One time order(s) placed for Vancomycin 1500 mg IV X 1 and Cefepime 2 gm IV X 1  Further antibiotics/pharmacy consults should be ordered by admitting physician if indicated.                       Thank you, Kaarin Pardy D 03/08/2022  2:41 AM

## 2022-03-09 DIAGNOSIS — A4150 Gram-negative sepsis, unspecified: Secondary | ICD-10-CM | POA: Diagnosis not present

## 2022-03-09 DIAGNOSIS — N390 Urinary tract infection, site not specified: Secondary | ICD-10-CM | POA: Diagnosis not present

## 2022-03-09 LAB — BASIC METABOLIC PANEL
Anion gap: 9 (ref 5–15)
BUN: 26 mg/dL — ABNORMAL HIGH (ref 8–23)
CO2: 22 mmol/L (ref 22–32)
Calcium: 9.9 mg/dL (ref 8.9–10.3)
Chloride: 110 mmol/L (ref 98–111)
Creatinine, Ser: 1.41 mg/dL — ABNORMAL HIGH (ref 0.44–1.00)
GFR, Estimated: 42 mL/min — ABNORMAL LOW (ref >60)
Glucose, Bld: 240 mg/dL — ABNORMAL HIGH (ref 70–99)
Potassium: 4.3 mmol/L (ref 3.5–5.1)
Sodium: 141 mmol/L (ref 135–145)

## 2022-03-09 LAB — CBC
HCT: 37.3 % (ref 36.0–46.0)
Hemoglobin: 10.8 g/dL — ABNORMAL LOW (ref 12.0–15.0)
MCH: 25.2 pg — ABNORMAL LOW (ref 26.0–34.0)
MCHC: 29 g/dL — ABNORMAL LOW (ref 30.0–36.0)
MCV: 87.1 fL (ref 80.0–100.0)
Platelets: 335 10*3/uL (ref 150–400)
RBC: 4.28 MIL/uL (ref 3.87–5.11)
RDW: 17.9 % — ABNORMAL HIGH (ref 11.5–15.5)
WBC: 21.7 10*3/uL — ABNORMAL HIGH (ref 4.0–10.5)
nRBC: 0 % (ref 0.0–0.2)

## 2022-03-09 LAB — PROCALCITONIN: Procalcitonin: 1.57 ng/mL

## 2022-03-09 LAB — GLUCOSE, CAPILLARY
Glucose-Capillary: 213 mg/dL — ABNORMAL HIGH (ref 70–99)
Glucose-Capillary: 231 mg/dL — ABNORMAL HIGH (ref 70–99)
Glucose-Capillary: 128 mg/dL — ABNORMAL HIGH (ref 70–99)
Glucose-Capillary: 198 mg/dL — ABNORMAL HIGH (ref 70–99)

## 2022-03-09 LAB — PROTIME-INR
INR: 1.8 — ABNORMAL HIGH (ref 0.8–1.2)
Prothrombin Time: 20.5 seconds — ABNORMAL HIGH (ref 11.4–15.2)

## 2022-03-09 LAB — HEMOGLOBIN A1C
Hgb A1c MFr Bld: 9.8 % — ABNORMAL HIGH (ref 4.8–5.6)
Mean Plasma Glucose: 235 mg/dL

## 2022-03-09 LAB — URINE CULTURE

## 2022-03-09 LAB — CORTISOL-AM, BLOOD: Cortisol - AM: 18.8 ug/dL (ref 6.7–22.6)

## 2022-03-09 NOTE — Inpatient Diabetes Management (Addendum)
Inpatient Diabetes Program Recommendations  AACE/ADA: New Consensus Statement on Inpatient Glycemic Control (2015)  Target Ranges:  Prepandial:   less than 140 mg/dL      Peak postprandial:   less than 180 mg/dL (1-2 hours)      Critically ill patients:  140 - 180 mg/dL    Latest Reference Range & Units 03/08/22 08:17 03/08/22 13:27 03/08/22 18:02 03/08/22 21:23  Glucose-Capillary 70 - 99 mg/dL 129 (H)  2 units Novolog  188 (H)  3 units Novolog  115 (H) 155 (H)  (H): Data is abnormally high  Latest Reference Range & Units 03/09/22 07:52 03/09/22 11:31  Glucose-Capillary 70 - 99 mg/dL 213 (H)  5 units Novolog  231 (H)  5 units Novolog   (H): Data is abnormally high    Home Meds: Metformin 500 mg daily           Jardiance 25 mg daily   Current Orders: Novolog 0-15 units TID ac/hs (started yest AM)     MD- May consider adding Novolog Meal Coverage while home oral DM meds are on hold:  Novolog 3 units TID with meals HOLD if pt NPO HOLD if pt eats <50% meals    --Will follow patient during hospitalization--  Wyn Quaker RN, MSN, [place] Diabetes Coordinator Inpatient Glycemic Control Team Team Pager: [phone] (8a-5p)

## 2022-03-09 NOTE — Progress Notes (Signed)
PROGRESS NOTE    Elizabeth Gutierrez  HWK:[id number] DOB: [birth year] DOA: 03/08/2022 PCP: Denton Lank, MD   Brief Narrative:  This 61 yrs old female with PMH significant for Chronic systolic CHF, coronary artery disease, type 2 diabetes mellitus, hypertension, dyslipidemia, paranoid schizophrenia and hypothyroidism, who presented to the emergency room with acute onset of altered mental status and generalized weakness with subsequent fall.  She was having fever 100.6. on arrival in ED. UA had moderate white cells, She was empirically started on Rocephin for UTI. Patient also had some hypoxemia with oxygen saturation 87%, was placed on 2 L oxygen.  VQ scan confirmed the  presence of pulmonary embolism, started on Eliquis.  Assessment & Plan:   Principal Problem:   Sepsis due to gram-negative UTI ([place]) Active Problems:   Generalized weakness   Acute respiratory failure with hypoxia (HCC)   Acute conjunctivitis   Chronic combined systolic (congestive) and diastolic (congestive) heart failure (HCC)   Benign essential HTN   Acute metabolic encephalopathy   Paranoid schizophrenia (HCC)   Type 2 diabetes mellitus with hyperlipidemia (HCC)   Hypothyroidism   Chronic kidney disease, stage 3a (HCC)   Anxiety and depression   Essential hypertension   Sepsis secondary to gram-negative UTI: Acute metabolic encephalopathy secondary to UTI: Likely secondary to UTI. Patient met sepsis criteria with tachycardia, fever, tachypnea and leukocytosis.  No lactic acidosis. She is hemodynamically stable.  Continue IV Rocephin for now. Urine culture contaminated.  Suggest recollection. She seems back to her baseline mental status. Sepsis physiology is improving.   Generalized weakness: PT and OT evaluation.  CKD stage IIIa: Creatinine at baseline. Avoid nephrotoxic medications.  Acute hypoxic respiratory failure, multifactorial: Patient does have chronic combined systolic and diastolic heart  failure. She continues to have shortness of breath.  Elevated D-dimer. Chest x-ray did not show any evidence of CHF or infection. Echo recently done in 2022 shows LVEF 30 to 35% with grade 1 diastolic dysfunction. VQ scan confirms the presence of pulmonary embolism. Continued on supplemental oxygen, weaned down to room air now. Started on Eliquis.   Acute Conjunctivitis: Continue erythromycin eye ointment.  Essential hypertension: Continue Coreg 3.125 mg p.o. 3 times daily   Type 2 diabetes with hyperglycemia: Hemoglobin A1c 9.8.   Continue sliding scale insulin. Carb modified diet.   Anxiety and depression: Continue Zoloft, Seroquel and Valium.   Hypothyroidism: Continue Synthroid.   Paranoid Schizophrenia : Mild mental retardation: Continue psychotropic medications.    DVT prophylaxis: Lovenox Code Status: Full code Family Communication: No family at bed side. Disposition Plan:   Status is: Inpatient Remains inpatient appropriate because: Severity of the disease, IV treatment.    Consultants:  None  Procedures: None  Antimicrobials:  Anti-infectives (From admission, onward)    Start     Dose/Rate Route Frequency Ordered Stop   03/08/22 1100  cefTRIAXone (ROCEPHIN) 2 g in sodium chloride 0.9 % 100 mL IVPB        2 g 200 mL/hr over 30 Minutes Intravenous Every 24 hours 03/08/22 0657 03/15/22 1059   03/08/22 0245  ceFEPIme (MAXIPIME) 2 g in sodium chloride 0.9 % 100 mL IVPB        2 g 200 mL/hr over 30 Minutes Intravenous  Once 03/08/22 0238 03/08/22 0406   03/08/22 0245  metroNIDAZOLE (FLAGYL) IVPB 500 mg        500 mg 100 mL/hr over 60 Minutes Intravenous  Once 03/08/22 0238 03/08/22 0619   03/08/22 0245  vancomycin (  VANCOCIN) IVPB 1000 mg/200 mL premix  Status:  Discontinued        1,000 mg 200 mL/hr over 60 Minutes Intravenous  Once 03/08/22 0238 03/08/22 0240   03/08/22 0245  vancomycin (VANCOREADY) IVPB 1500 mg/300 mL        1,500 mg 150 mL/hr over  120 Minutes Intravenous  Once 03/08/22 0241 03/08/22 4431        Subjective: Patient was seen and examined at bedside.  Overnight events noted. Patient appears slightly anxious and upset, this could be her baseline  as she has dementia. She denies any UTI symptoms.  She denies any fever, remains on room air.  Objective: Vitals:   03/08/22 1400 03/08/22 1645 03/08/22 1924 03/09/22 0751  BP: 128/65 125/72 128/64 109/73  Pulse: (!) 111 100 (!) 108 94  Resp: _0 Temp:  99.6 F (37.6 C) 98.4 F (36.9 C) 98.1 F (36.7 C)  TempSrc:  Oral Oral   SpO2: 100% 92% 95% 98%  Weight:      Height:        Intake/Output Summary (Last 24 hours) at 03/09/2022 1137 Last data filed at 03/09/2022 1052 Gross per 24 hour  Intake 360 ml  Output 1210 ml  Net -850 ml   Filed Weights   03/07/22 1939  Weight: 68 kg    Examination:  General exam: Appears comfortable, not in any acute distress.  Deconditioned. Respiratory system: Clear to auscultation. Respiratory effort normal.  RR 15 Cardiovascular system: S1 & S2 heard, regular rate and rhythm, no murmur. Gastrointestinal system: Abdomen is soft, non tender, non distended, BS+ Central nervous system: Alert and oriented x 2. No focal neurological deficits. Extremities: No edema, no cyanosis, no clubbing. Skin: No rashes, lesions or ulcers Psychiatry: Judgement and insight appear normal. Mood & affect appropriate.     Data Reviewed: I have personally reviewed following labs and imaging studies  CBC: Recent Labs  Lab 03/07/22 1944 03/08/22 1721 03/09/22 1058  WBC 22.4* 22.9* 21.7*  NEUTROABS 18.8*  --   --   HGB 10.4* 11.7* 10.8*  HCT 36.5 40.2 37.3  MCV 88.0 86.6 87.1  PLT 376 371 540   Basic Metabolic Panel: Recent Labs  Lab 03/07/22 1944 03/07/22 2357 03/09/22 1058  NA 141 141 141  K 4.1 4.1 4.3  CL 107 109 110  CO2 22 21* 22  GLUCOSE 217* 192* 240*  BUN 23 24* 26*  CREATININE 1.56* 1.45* 1.41*  CALCIUM 9.8  10.0 9.9   GFR: Estimated Creatinine Clearance: 45 mL/min (A) (by C-G formula based on SCr of 1.41 mg/dL (H)). Liver Function Tests: Recent Labs  Lab 03/07/22 1944  AST 12*  ALT 10  ALKPHOS 97  BILITOT 0.9  PROT 8.2*  ALBUMIN 3.3*   No results for input(s): "LIPASE", "AMYLASE" in the last 168 hours. No results for input(s): "AMMONIA" in the last 168 hours. Coagulation Profile: Recent Labs  Lab 03/08/22 0347 03/09/22 1058  INR 1.3* 1.8*   Cardiac Enzymes: No results for input(s): "CKTOTAL", "CKMB", "CKMBINDEX", "TROPONINI" in the last 168 hours. BNP (last 3 results) No results for input(s): "PROBNP" in the last 8760 hours. HbA1C: Recent Labs    03/07/22 1944  HGBA1C 9.8*   CBG: Recent Labs  Lab 03/08/22 1327 03/08/22 1802 03/08/22 2123 03/09/22 0752 03/09/22 1131  GLUCAP 188* 115* 155* 213* 231*   Lipid Profile: No results for input(s): "CHOL", "HDL", "LDLCALC", "TRIG", "CHOLHDL", "LDLDIRECT" in the last 72 hours. Thyroid  Function Tests: No results for input(s): "TSH", "T4TOTAL", "FREET4", "T3FREE", "THYROIDAB" in the last 72 hours. Anemia Panel: No results for input(s): "VITAMINB12", "FOLATE", "FERRITIN", "TIBC", "IRON", "RETICCTPCT" in the last 72 hours. Sepsis Labs: Recent Labs  Lab 03/07/22 1944  LATICACIDVEN 0.9    Recent Results (from the past 240 hour(s))  Resp panel by RT-PCR (RSV, Flu A&B, Covid) Anterior Nasal Swab     Status: None   Collection Time: 03/07/22  7:44 PM   Specimen: Anterior Nasal Swab  Result Value Ref Range Status   SARS Coronavirus 2 by RT PCR NEGATIVE NEGATIVE Final    Comment: (NOTE) SARS-CoV-2 target nucleic acids are NOT DETECTED.  The SARS-CoV-2 RNA is generally detectable in upper respiratory specimens during the acute phase of infection. The lowest concentration of SARS-CoV-2 viral copies this assay can detect is 138 copies/mL. A negative result does not preclude SARS-Cov-2 infection and should not be used as the  sole basis for treatment or other patient management decisions. A negative result may occur with  improper specimen collection/handling, submission of specimen other than nasopharyngeal swab, presence of viral mutation(s) within the areas targeted by this assay, and inadequate number of viral copies(<138 copies/mL). A negative result must be combined with clinical observations, patient history, and epidemiological information. The expected result is Negative.  Fact Sheet for Patients:  EntrepreneurPulse.com.au  Fact Sheet for Healthcare Providers:  IncredibleEmployment.be  This test is no t yet approved or cleared by the [place] FDA and  has been authorized for detection and/or diagnosis of SARS-CoV-2 by FDA under an Emergency Use Authorization (EUA). This EUA will remain  in effect (meaning this test can be used) for the duration of the COVID-19 declaration under Section 564(b)(1) of the Act, 21 U.S.C.section 360bbb-3(b)(1), unless the authorization is terminated  or revoked sooner.       Influenza A by PCR NEGATIVE NEGATIVE Final   Influenza B by PCR NEGATIVE NEGATIVE Final    Comment: (NOTE) The Xpert Xpress SARS-CoV-2/FLU/RSV plus assay is intended as an aid in the diagnosis of influenza from Nasopharyngeal swab specimens and should not be used as a sole basis for treatment. Nasal washings and aspirates are unacceptable for Xpert Xpress SARS-CoV-2/FLU/RSV testing.  Fact Sheet for Patients: EntrepreneurPulse.com.au  Fact Sheet for Healthcare Providers: IncredibleEmployment.be  This test is not yet approved or cleared by the [place] FDA and has been authorized for detection and/or diagnosis of SARS-CoV-2 by FDA under an Emergency Use Authorization (EUA). This EUA will remain in effect (meaning this test can be used) for the duration of the COVID-19 declaration under Section 564(b)(1) of the  Act, 21 U.S.C. section 360bbb-3(b)(1), unless the authorization is terminated or revoked.     Resp Syncytial Virus by PCR NEGATIVE NEGATIVE Final    Comment: (NOTE) Fact Sheet for Patients: EntrepreneurPulse.com.au  Fact Sheet for Healthcare Providers: IncredibleEmployment.be  This test is not yet approved or cleared by the [place] FDA and has been authorized for detection and/or diagnosis of SARS-CoV-2 by FDA under an Emergency Use Authorization (EUA). This EUA will remain in effect (meaning this test can be used) for the duration of the COVID-19 declaration under Section 564(b)(1) of the Act, 21 U.S.C. section 360bbb-3(b)(1), unless the authorization is terminated or revoked.  Performed at [facility], [address] [place], [place] [address]   Blood Culture (routine x 2)     Status: None (Preliminary result)   Collection Time: 03/08/22  3:47 AM   Specimen: BLOOD  LEFT ARM  Result Value Ref Range Status   Specimen Description BLOOD LEFT ARM  Final   Special Requests   Final    BOTTLES DRAWN AEROBIC AND ANAEROBIC Blood Culture adequate volume   Culture   Final    NO GROWTH 1 DAY Performed at [facility], [address] [place], [place] [address]    Report Status PENDING  Incomplete  Blood Culture (routine x 2)     Status: None (Preliminary result)   Collection Time: 03/08/22  3:47 AM   Specimen: BLOOD RIGHT ARM  Result Value Ref Range Status   Specimen Description BLOOD RIGHT ARM  Final   Special Requests   Final    BOTTLES DRAWN AEROBIC AND ANAEROBIC Blood Culture adequate volume   Culture   Final    NO GROWTH 1 DAY Performed at [facility], [address] [place], [place] [address]    Report Status PENDING  Incomplete  Urine Culture     Status: Abnormal   Collection Time: 03/08/22  4:28 AM   Specimen: In/Out Cath Urine  Result Value Ref Range Status   Specimen Description   Final     IN/OUT CATH URINE Performed at [facility], [address] [place], [place] [address]    Special Requests   Final    NONE Performed at [facility], [address], [place], [place] [address]    Culture MULTIPLE SPECIES PRESENT, SUGGEST RECOLLECTION (A)  Final   Report Status 03/09/2022 FINAL  Final         Radiology Studies: NM Pulmonary Perfusion  Result Date: 03/08/2022 CLINICAL DATA:  Altered level of consciousness, weakness, fever, elevated D-dimer EXAM: NUCLEAR MEDICINE PERFUSION LUNG SCAN TECHNIQUE: Perfusion images were obtained in multiple projections after intravenous injection of radiopharmaceutical. Ventilation scans intentionally deferred if perfusion scan and chest x-ray adequate for interpretation during COVID 19 epidemic. RADIOPHARMACEUTICALS:  3.99 mCi Tc-17mMAA IV COMPARISON:  Chest x-ray 03/07/2022 FINDINGS: Planar imaging of the chest was obtained in multiple projections after the intravenous administration of radiotracer. On the LPO and left lateral projections, there is a wedge-shaped perfusion defect within the superior segment of the left lower lobe, with no clear abnormality on corresponding x-ray. No other perfusion defects are identified. IMPRESSION: 1. Utilizing the PISAPED criteria, examination is positive for pulmonary embolus with wedge-shaped perfusion defect seen in the superior segment left lower lobe. Critical Value/emergent results were called by telephone at the time of interpretation on 03/08/2022 at 3:09 pm to provider [facility], who verbally acknowledged these results. Electronically Signed   By: MRanda NgoM.D.   On: 03/08/2022 15:20   CT Head Wo Contrast  Result Date: 03/07/2022 CLINICAL DATA:  Fall, back pain EXAM: CT HEAD WITHOUT CONTRAST CT CERVICAL SPINE WITHOUT CONTRAST TECHNIQUE: Multidetector CT imaging of the head and cervical spine was performed following the standard protocol without intravenous contrast.  Multiplanar CT image reconstructions of the cervical spine were also generated. RADIATION DOSE REDUCTION: This exam was performed according to the departmental dose-optimization program which includes automated exposure control, adjustment of the mA and/or kV according to patient size and/or use of iterative reconstruction technique. COMPARISON:  CT head dated 05/07/2021 FINDINGS: CT HEAD FINDINGS Brain: No evidence of acute infarction, hemorrhage, hydrocephalus, extra-axial collection or mass lesion/mass effect. Mild subcortical white matter and periventricular small vessel ischemic changes. Vascular: Intracranial atherosclerosis. Skull: Normal. Negative for fracture or focal lesion. Sinuses/Orbits: The visualized paranasal sinuses are essentially clear.  The mastoid air cells are unopacified. Other: None. CT CERVICAL SPINE FINDINGS Alignment: Normal cervical lordosis. Skull base and vertebrae: No acute fracture. No primary bone lesion or focal pathologic process. Soft tissues and spinal canal: No prevertebral fluid or swelling. No visible canal hematoma. Disc levels: Mild degenerative changes at C5-6. Spinal canal is patent. Upper chest: Visualized lung apices are clear. Other: Small bilateral thyroid nodules measuring up to 10 mm, likely reflecting multinodular goiter. No follow-up is recommended. IMPRESSION: No evidence of acute intracranial abnormality. Mild small vessel ischemic changes. No traumatic injury to the cervical spine. Mild degenerative changes at C5-6. Electronically Signed   By: Julian Hy M.D.   On: 03/07/2022 20:34   CT Cervical Spine Wo Contrast  Result Date: 03/07/2022 CLINICAL DATA:  Fall, back pain EXAM: CT HEAD WITHOUT CONTRAST CT CERVICAL SPINE WITHOUT CONTRAST TECHNIQUE: Multidetector CT imaging of the head and cervical spine was performed following the standard protocol without intravenous contrast. Multiplanar CT image reconstructions of the cervical spine were also  generated. RADIATION DOSE REDUCTION: This exam was performed according to the departmental dose-optimization program which includes automated exposure control, adjustment of the mA and/or kV according to patient size and/or use of iterative reconstruction technique. COMPARISON:  CT head dated 05/07/2021 FINDINGS: CT HEAD FINDINGS Brain: No evidence of acute infarction, hemorrhage, hydrocephalus, extra-axial collection or mass lesion/mass effect. Mild subcortical white matter and periventricular small vessel ischemic changes. Vascular: Intracranial atherosclerosis. Skull: Normal. Negative for fracture or focal lesion. Sinuses/Orbits: The visualized paranasal sinuses are essentially clear. The mastoid air cells are unopacified. Other: None. CT CERVICAL SPINE FINDINGS Alignment: Normal cervical lordosis. Skull base and vertebrae: No acute fracture. No primary bone lesion or focal pathologic process. Soft tissues and spinal canal: No prevertebral fluid or swelling. No visible canal hematoma. Disc levels: Mild degenerative changes at C5-6. Spinal canal is patent. Upper chest: Visualized lung apices are clear. Other: Small bilateral thyroid nodules measuring up to 10 mm, likely reflecting multinodular goiter. No follow-up is recommended. IMPRESSION: No evidence of acute intracranial abnormality. Mild small vessel ischemic changes. No traumatic injury to the cervical spine. Mild degenerative changes at C5-6. Electronically Signed   By: Julian Hy M.D.   On: 03/07/2022 20:34   DG Chest 1 View  Result Date: 03/07/2022 CLINICAL DATA:  Fall, back pain EXAM: CHEST  1 VIEW COMPARISON:  06/30/2020 FINDINGS: Lungs are clear.  No pleural effusion or pneumothorax. Cardiomegaly. IMPRESSION: Cardiomegaly. No evidence of acute cardiopulmonary disease. Electronically Signed   By: Julian Hy M.D.   On: 03/07/2022 20:32   DG Lumbar Spine 2-3 Views  Result Date: 03/07/2022 CLINICAL DATA:  Fall EXAM: LUMBAR SPINE -  2-3 VIEW COMPARISON:  Lumbar spine x-ray 12/01/2014 FINDINGS: There is no evidence of lumbar spine fracture. Alignment is normal. Intervertebral disc spaces are maintained. IMPRESSION: Negative. Electronically Signed   By: Ronney Asters M.D.   On: 03/07/2022 20:31   DG Femur Min 2 Views Right  Result Date: 03/07/2022 CLINICAL DATA:  Fall EXAM: RIGHT FEMUR 2 VIEWS COMPARISON:  None Available. FINDINGS: No fracture or dislocation is seen. Joint spaces are preserved. Visualized soft tissues are within normal limits. IMPRESSION: Negative. Electronically Signed   By: Julian Hy M.D.   On: 03/07/2022 20:31   DG Femur Min 2 Views Left  Result Date: 03/07/2022 CLINICAL DATA:  Fall EXAM: LEFT FEMUR 2 VIEWS COMPARISON:  None Available. FINDINGS: No fracture or dislocation is seen. Hip joint space is preserved.  Degenerative changes  of the knee. Visualized soft tissues are within normal limits. IMPRESSION: Negative. Electronically Signed   By: Julian Hy M.D.   On: 03/07/2022 20:31   DG Pelvis 1-2 Views  Result Date: 03/07/2022 CLINICAL DATA:  Fall, back pain EXAM: PELVIS - 1-2 VIEW COMPARISON:  None Available. FINDINGS: No fracture or dislocation is seen. Bilobed joint spaces are preserved. Visualized bony pelvis appears intact. IMPRESSION: Negative. Electronically Signed   By: Julian Hy M.D.   On: 03/07/2022 20:30    Scheduled Meds:  apixaban  10 mg Oral BID   Followed by   Derrill Memo ON 03/15/2022] apixaban  5 mg Oral BID   ascorbic acid  1,000 mg Oral Daily   aspirin EC  81 mg Oral Daily   atorvastatin  20 mg Oral Daily   benztropine  0.5 mg Oral BID   carvedilol  3.125 mg Oral BID   donepezil  10 mg Oral QHS   erythromycin   Both Eyes Q6H   [START ON 03/19/2022] fluPHENAZine decanoate  32.5 mg Intramuscular Q14 Days   fluticasone  2 spray Each Nare Daily   gabapentin  200 mg Oral TID   insulin aspart  0-15 Units Subcutaneous TID WC   insulin aspart  0-5 Units Subcutaneous  QHS   levothyroxine  25 mcg Oral Q0600   pantoprazole  40 mg Oral Daily   polyethylene glycol  17 g Oral Daily   QUEtiapine  100 mg Oral Q1200   QUEtiapine  600 mg Oral QHS   senna  1 tablet Oral Daily   sertraline  100 mg Oral Daily   Continuous Infusions:  cefTRIAXone (ROCEPHIN)  IV Stopped (03/08/22 1128)     LOS: 1 day    Time spent: 50 mins    Elizabeth Paulsen, MD [facility]   If 7PM-7AM, please contact night-coverage

## 2022-03-09 NOTE — Plan of Care (Signed)
  Problem: Fluid Volume: Goal: Hemodynamic stability will improve Outcome: Progressing   Problem: Clinical Measurements: Goal: Diagnostic test results will improve Outcome: Progressing Goal: Signs and symptoms of infection will decrease Outcome: Progressing   Problem: Respiratory: Goal: Ability to maintain adequate ventilation will improve Outcome: Progressing   Problem: Education: Goal: Ability to describe self-care measures that may prevent or decrease complications (Diabetes Survival Skills Education) will improve Outcome: Progressing Goal: Individualized Educational Video(s) Outcome: Progressing

## 2022-03-09 NOTE — TOC Initial Note (Signed)
Transition of Care [facility]) - Initial/Assessment Note    Patient Details  Name: Elizabeth Gutierrez MRN: [id number] Date of Birth: [birth year]  Transition of Care [facility]) CM/SW Contact:    Beverly Sessions, RN Phone Number: 03/09/2022, 3:25 PM  Clinical Narrative:                    Admitted for: UTI Admitted from: [name] PCP: Posey Pronto    -Patient legal Gaurdian listed as mother - VM left - Vm left for Gena Fray Rn at [place] return call from Mother.  States group home will provide transport at discharge, and defers choice to home health if needed to [facility]    Patient Goals and CMS Choice            Expected Discharge Plan and Services                                                Prior Living Arrangements/Services                       Activities of Daily Living Home Assistive Devices/Equipment: Other (Comment) ADL Screening (condition at time of admission) Patient's cognitive ability adequate to safely complete daily activities?: No Is the patient deaf or have difficulty hearing?: No Does the patient have difficulty seeing, even when wearing glasses/contacts?: No Does the patient have difficulty concentrating, remembering, or making decisions?: Yes Patient able to express need for assistance with ADLs?: No Does the patient have difficulty dressing or bathing?: No Independently performs ADLs?: Yes (appropriate for developmental age) Does the patient have difficulty walking or climbing stairs?: No Weakness of Legs: Both Weakness of Arms/Hands: Both  Permission Sought/Granted                  Emotional Assessment              Admission diagnosis:  Lower urinary tract infectious disease [N39.0] Sepsis due to gram-negative UTI ([place]) [A41.50, N39.0] Sepsis, due to unspecified organism, unspecified whether acute organ dysfunction present [facility]) [A41.9] Patient Active Problem List   Diagnosis Date Noted   Sepsis due to  gram-negative UTI ([place]) 03/08/2022   Generalized weakness 03/08/2022   Anxiety and depression 03/08/2022   Essential hypertension 03/08/2022   Acute conjunctivitis 03/08/2022   Malnutrition of moderate degree 11/28/2019   Goals of care, counseling/discussion    Palliative care by specialist    DNR (do not resuscitate) discussion    Hypercalcemia    Tachycardia    Weakness    Acute kidney injury superimposed on CKD ([place])    Acute cystitis with hematuria    Acute respiratory failure with hypoxia ([place]) 11/22/2019   COVID-19 11/22/2019   Bilateral pneumonia 09/28/2019   Shortness of breath 09/26/2019   Hyperlipidemia    Paranoid schizophrenia ([place])    Type 2 diabetes mellitus with hyperlipidemia ([place])    Hypothyroidism    Chronic kidney disease, stage 3a ([place])    Acute on chronic respiratory failure with hypoxia ([place])    Acute metabolic encephalopathy 78/46/9629   Altered mental state 08/17/2016   DDD (degenerative disc disease), lumbar 07/09/2015   Facet syndrome, lumbar 07/09/2015   Lumbar radiculopathy 07/09/2015   Sacroiliac joint dysfunction 07/09/2015   Spinal stenosis, lumbar region, 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  Diabetic neuropathy ([place]) 07/09/2015   CAD in native artery 06/16/2015   Controlled type 2 diabetes mellitus without complication ([place]) 29/92/4268   Benign essential HTN 06/16/2015   Obstructive apnea 01/05/2015   Esophagitis, reflux 08/06/2014   Edema leg 05/14/2014   Chronic combined systolic (congestive) and diastolic (congestive) heart failure ([place]) 05/14/2014   TI (tricuspid incompetence) 05/14/2014   Chest pain 12/13/2013   Combined fat and carbohydrate induced hyperlipemia 10/09/2013   Breath shortness 10/09/2013   PCP:  Denton Lank, MD Pharmacy:   [place], [place] - [place] [address] [place] [address] Phone: [phone] Fax: [phone]     Social Determinants of  Health (SDOH) Social History: SDOH Screenings   Food Insecurity: No Food Insecurity (03/08/2022)  Housing: Low Risk  (03/09/2022)  Transportation Needs: No Transportation Needs (03/08/2022)  Utilities: Not At Risk (03/08/2022)  Tobacco Use: Medium Risk (03/08/2022)   SDOH Interventions: Housing Interventions: Patient Refused   Readmission Risk Interventions     No data to display

## 2022-03-10 DIAGNOSIS — N390 Urinary tract infection, site not specified: Secondary | ICD-10-CM | POA: Diagnosis not present

## 2022-03-10 DIAGNOSIS — A4150 Gram-negative sepsis, unspecified: Secondary | ICD-10-CM | POA: Diagnosis not present

## 2022-03-10 LAB — GLUCOSE, CAPILLARY
Glucose-Capillary: 114 mg/dL — ABNORMAL HIGH (ref 70–99)
Glucose-Capillary: 219 mg/dL — ABNORMAL HIGH (ref 70–99)
Glucose-Capillary: 149 mg/dL — ABNORMAL HIGH (ref 70–99)
Glucose-Capillary: 137 mg/dL — ABNORMAL HIGH (ref 70–99)

## 2022-03-10 LAB — CBC
HCT: 34.4 % — ABNORMAL LOW (ref 36.0–46.0)
Hemoglobin: 9.9 g/dL — ABNORMAL LOW (ref 12.0–15.0)
MCH: 24.9 pg — ABNORMAL LOW (ref 26.0–34.0)
MCHC: 28.8 g/dL — ABNORMAL LOW (ref 30.0–36.0)
MCV: 86.4 fL (ref 80.0–100.0)
Platelets: 320 10*3/uL (ref 150–400)
RBC: 3.98 MIL/uL (ref 3.87–5.11)
RDW: 17.5 % — ABNORMAL HIGH (ref 11.5–15.5)
WBC: 13.4 10*3/uL — ABNORMAL HIGH (ref 4.0–10.5)
nRBC: 0 % (ref 0.0–0.2)

## 2022-03-10 LAB — CREATININE, SERUM
Creatinine, Ser: 1.23 mg/dL — ABNORMAL HIGH (ref 0.44–1.00)
GFR, Estimated: 50 mL/min — ABNORMAL LOW (ref >60)

## 2022-03-10 MED ORDER — DIVALPROEX SODIUM 125 MG PO CSDR
250.0000 mg | DELAYED_RELEASE_CAPSULE | Freq: Two times a day (BID) | ORAL | Status: DC
  Administered 2022-03-10 – 2022-03-11 (×2): 250 mg via ORAL
  Filled 2022-03-10 (×2): qty 2

## 2022-03-10 NOTE — Plan of Care (Signed)
  Problem: Fluid Volume: Goal: Hemodynamic stability will improve 03/10/2022 1116 by Evelena Peat, RN Outcome: Progressing 03/10/2022 1116 by Evelena Peat, RN Outcome: Progressing   Problem: Clinical Measurements: Goal: Diagnostic test results will improve 03/10/2022 1116 by Evelena Peat, RN Outcome: Progressing 03/10/2022 1116 by Evelena Peat, RN Outcome: Progressing Goal: Signs and symptoms of infection will decrease 03/10/2022 1116 by Evelena Peat, RN Outcome: Progressing 03/10/2022 1116 by Evelena Peat, RN Outcome: Progressing   Problem: Respiratory: Goal: Ability to maintain adequate ventilation will improve 03/10/2022 1116 by Evelena Peat, RN Outcome: Progressing 03/10/2022 1116 by Evelena Peat, RN Outcome: Progressing   Problem: Education: Goal: Ability to describe self-care measures that may prevent or decrease complications (Diabetes Survival Skills Education) will improve 03/10/2022 1116 by Evelena Peat, RN Outcome: Progressing 03/10/2022 1116 by Evelena Peat, RN Outcome: Progressing Goal: Individualized Educational Video(s) 03/10/2022 1116 by Evelena Peat, RN Outcome: Progressing 03/10/2022 1116 by Evelena Peat, RN Outcome: Progressing

## 2022-03-10 NOTE — Plan of Care (Incomplete)
  Problem: Fluid Volume: Goal: Hemodynamic stability will improve Outcome: Progressing   Problem: Clinical Measurements: Goal: Diagnostic test results will improve Outcome: Progressing Goal: Signs and symptoms of infection will decrease Outcome: Progressing   Problem: Respiratory: Goal: Ability to maintain adequate ventilation will improve Outcome: Progressing   Problem: Education: Goal: Ability to describe self-care measures that may prevent or decrease complications (Diabetes Survival Skills Education) will improve Outcome: Progressing Goal: Individualized Educational Video(s) Outcome: Progressing

## 2022-03-10 NOTE — Progress Notes (Signed)
PROGRESS NOTE    Elizabeth Gutierrez  QBH:[id number] DOB: Oct 24, 1960 DOA: 03/08/2022 PCP: Denton Lank, MD   Brief Narrative:  This 61 yrs old female with PMH significant for Chronic systolic CHF, coronary artery disease, type 2 diabetes mellitus, hypertension, dyslipidemia, paranoid schizophrenia and hypothyroidism, who presented to the emergency room with acute onset of altered mental status and generalized weakness with subsequent fall.  She was having fever 100.6. on arrival in ED. UA had moderate white cells, She was empirically started on Rocephin for UTI. Patient also had some hypoxemia with oxygen saturation 87%, was placed on 2 L oxygen.  VQ scan confirmed the  presence of pulmonary embolism, started on Eliquis.  Assessment & Plan:   Principal Problem:   Sepsis due to gram-negative UTI ([place]) Active Problems:   Generalized weakness   Acute respiratory failure with hypoxia ([facility])   Acute conjunctivitis   Chronic combined systolic (congestive) and diastolic (congestive) heart failure ([facility])   Benign essential HTN   Acute metabolic encephalopathy   Paranoid schizophrenia ([facility])   Type 2 diabetes mellitus with hyperlipidemia ([facility])   Hypothyroidism   Chronic kidney disease, stage 3a ([facility])   Anxiety and depression   Essential hypertension   Sepsis secondary to UTI: Acute metabolic encephalopathy secondary to UTI: Likely secondary to UTI. Patient met sepsis criteria with tachycardia, fever, tachypnea and leukocytosis.  No lactic acidosis. She is hemodynamically stable.  Continue IV Rocephin x 3 days . Urine culture contaminated.  Suggest recollection. She seems back to her baseline mental status. Sepsis physiology is improving.   Generalized weakness: PT and OT evaluation.  CKD stage IIIa: Creatinine at baseline. Avoid nephrotoxic medications.  Acute hypoxic respiratory failure, multifactorial: Patient does have chronic combined systolic and diastolic heart failure. She  continues to have shortness of breath.  Elevated D-dimer. Chest x-ray did not show any evidence of CHF or infection. Echo recently done in 2022 shows LVEF 30 to 35% with grade 1 diastolic dysfunction. VQ scan confirms the presence of pulmonary embolism. Continued on supplemental oxygen, weaned down to room air now. Continue Eliquis 10 mg bid.   Acute Conjunctivitis: Continue erythromycin eye ointment.  Essential hypertension: Continue Coreg 3.125 mg  q12hr   Type 2 diabetes with hyperglycemia: Hemoglobin A1c 9.8.   Continue sliding scale insulin. Carb modified diet.   Anxiety and depression: Continue Zoloft, Seroquel and Valium.   Hypothyroidism: Continue Synthroid.   Paranoid Schizophrenia : Mild mental retardation: Continue psychotropic medications.    DVT prophylaxis: Eliquis Code Status: Full code Family Communication: No family at bed side. Disposition Plan:   Status is: Inpatient Remains inpatient appropriate because: Severity of the disease, IV treatment.    Consultants:  None  Procedures: None  Antimicrobials:  Anti-infectives (From admission, onward)    Start     Dose/Rate Route Frequency Ordered Stop   03/08/22 1100  cefTRIAXone (ROCEPHIN) 2 g in sodium chloride 0.9 % 100 mL IVPB        2 g 200 mL/hr over 30 Minutes Intravenous Every 24 hours 03/08/22 0657 03/15/22 1059   03/08/22 0245  ceFEPIme (MAXIPIME) 2 g in sodium chloride 0.9 % 100 mL IVPB        2 g 200 mL/hr over 30 Minutes Intravenous  Once 03/08/22 0238 03/08/22 0406   03/08/22 0245  metroNIDAZOLE (FLAGYL) IVPB 500 mg        500 mg 100 mL/hr over 60 Minutes Intravenous  Once 03/08/22 0238 03/08/22 0619   03/08/22 0245  vancomycin (VANCOCIN) IVPB 1000 mg/200 mL premix  Status:  Discontinued        1,000 mg 200 mL/hr over 60 Minutes Intravenous  Once 03/08/22 0238 03/08/22 0240   03/08/22 0245  vancomycin (VANCOREADY) IVPB 1500 mg/300 mL        1,500 mg 150 mL/hr over 120 Minutes  Intravenous  Once 03/08/22 0241 03/08/22 0962        Subjective: Patient was seen and examined at bedside.  Overnight events noted. Patient appears slightly anxious, restless, this could be her baseline as she has dementia. She denies any UTI symptoms.  She denies any fever, remains on room air.  Objective: Vitals:   03/09/22 1542 03/09/22 2126 03/10/22 0310 03/10/22 0757  BP: 118/81 103/72 109/65 127/78  Pulse: 81 91 87 86  Resp: _0 Temp: 98.1 F (36.7 C) 98.3 F (36.8 C) 98.3 F (36.8 C) 97.6 F (36.4 C)  TempSrc:    Oral  SpO2: 97% 99% 94% 90%  Weight:      Height:        Intake/Output Summary (Last 24 hours) at 03/10/2022 1045 Last data filed at 03/10/2022 0647 Gross per 24 hour  Intake 940 ml  Output 800 ml  Net 140 ml   Filed Weights   03/07/22 1939  Weight: 68 kg    Examination:  General exam: Appears comfortable, not in any acute distress.  Deconditioned. Respiratory system: Clear to auscultation. Respiratory effort normal.  RR 15 Cardiovascular system: S1 & S2 heard, regular rate and rhythm, no murmur. Gastrointestinal system: Abdomen is soft, non tender, non distended, BS+ Central nervous system: Alert and oriented x 2, no focal neurological deficits. Extremities: No edema, no cyanosis, no clubbing. Skin: No rashes, lesions or ulcers Psychiatry: Judgement and insight appear normal. Mood & affect appropriate.     Data Reviewed: I have personally reviewed following labs and imaging studies  CBC: Recent Labs  Lab 03/07/22 1944 03/08/22 1721 03/09/22 1058 03/10/22 0627  WBC 22.4* 22.9* 21.7* 13.4*  NEUTROABS 18.8*  --   --   --   HGB 10.4* 11.7* 10.8* 9.9*  HCT 36.5 40.2 37.3 34.4*  MCV 88.0 86.6 87.1 86.4  PLT 376 371 335 836   Basic Metabolic Panel: Recent Labs  Lab 03/07/22 1944 03/07/22 2357 03/09/22 1058 03/10/22 0627  NA 141 141 141  --   K 4.1 4.1 4.3  --   CL 107 109 110  --   CO2 22 21* 22  --   GLUCOSE 217* 192*  240*  --   BUN 23 24* 26*  --   CREATININE 1.56* 1.45* 1.41* 1.23*  CALCIUM 9.8 10.0 9.9  --    GFR: Estimated Creatinine Clearance: 51.6 mL/min (A) (by C-G formula based on SCr of 1.23 mg/dL (H)). Liver Function Tests: Recent Labs  Lab 03/07/22 1944  AST 12*  ALT 10  ALKPHOS 97  BILITOT 0.9  PROT 8.2*  ALBUMIN 3.3*   No results for input(s): "LIPASE", "AMYLASE" in the last 168 hours. No results for input(s): "AMMONIA" in the last 168 hours. Coagulation Profile: Recent Labs  Lab 03/08/22 0347 03/09/22 1058  INR 1.3* 1.8*   Cardiac Enzymes: No results for input(s): "CKTOTAL", "CKMB", "CKMBINDEX", "TROPONINI" in the last 168 hours. BNP (last 3 results) No results for input(s): "PROBNP" in the last 8760 hours. HbA1C: Recent Labs    03/07/22 1944  HGBA1C 9.8*   CBG: Recent Labs  Lab 03/09/22 0752 03/09/22  1131 03/09/22 1559 03/09/22 2134 03/10/22 0754  GLUCAP 213* 231* 128* 198* 114*   Lipid Profile: No results for input(s): "CHOL", "HDL", "LDLCALC", "TRIG", "CHOLHDL", "LDLDIRECT" in the last 72 hours. Thyroid Function Tests: No results for input(s): "TSH", "T4TOTAL", "FREET4", "T3FREE", "THYROIDAB" in the last 72 hours. Anemia Panel: No results for input(s): "VITAMINB12", "FOLATE", "FERRITIN", "TIBC", "IRON", "RETICCTPCT" in the last 72 hours. Sepsis Labs: Recent Labs  Lab 03/07/22 1944 03/09/22 1058  PROCALCITON  --  1.57  LATICACIDVEN 0.9  --     Recent Results (from the past 240 hour(s))  Resp panel by RT-PCR (RSV, Flu A&B, Covid) Anterior Nasal Swab     Status: None   Collection Time: 03/07/22  7:44 PM   Specimen: Anterior Nasal Swab  Result Value Ref Range Status   SARS Coronavirus 2 by RT PCR NEGATIVE NEGATIVE Final    Comment: (NOTE) SARS-CoV-2 target nucleic acids are NOT DETECTED.  The SARS-CoV-2 RNA is generally detectable in upper respiratory specimens during the acute phase of infection. The lowest concentration of SARS-CoV-2 viral  copies this assay can detect is 138 copies/mL. A negative result does not preclude SARS-Cov-2 infection and should not be used as the sole basis for treatment or other patient management decisions. A negative result may occur with  improper specimen collection/handling, submission of specimen other than nasopharyngeal swab, presence of viral mutation(s) within the areas targeted by this assay, and inadequate number of viral copies(<138 copies/mL). A negative result must be combined with clinical observations, patient history, and epidemiological information. The expected result is Negative.  Fact Sheet for Patients:  EntrepreneurPulse.com.au  Fact Sheet for Healthcare Providers:  IncredibleEmployment.be  This test is no t yet approved or cleared by the [place] FDA and  has been authorized for detection and/or diagnosis of SARS-CoV-2 by FDA under an Emergency Use Authorization (EUA). This EUA will remain  in effect (meaning this test can be used) for the duration of the COVID-19 declaration under Section 564(b)(1) of the Act, 21 U.S.C.section 360bbb-3(b)(1), unless the authorization is terminated  or revoked sooner.       Influenza A by PCR NEGATIVE NEGATIVE Final   Influenza B by PCR NEGATIVE NEGATIVE Final    Comment: (NOTE) The Xpert Xpress SARS-CoV-2/FLU/RSV plus assay is intended as an aid in the diagnosis of influenza from Nasopharyngeal swab specimens and should not be used as a sole basis for treatment. Nasal washings and aspirates are unacceptable for Xpert Xpress SARS-CoV-2/FLU/RSV testing.  Fact Sheet for Patients: EntrepreneurPulse.com.au  Fact Sheet for Healthcare Providers: IncredibleEmployment.be  This test is not yet approved or cleared by the [place] FDA and has been authorized for detection and/or diagnosis of SARS-CoV-2 by FDA under an Emergency Use Authorization (EUA). This  EUA will remain in effect (meaning this test can be used) for the duration of the COVID-19 declaration under Section 564(b)(1) of the Act, 21 U.S.C. section 360bbb-3(b)(1), unless the authorization is terminated or revoked.     Resp Syncytial Virus by PCR NEGATIVE NEGATIVE Final    Comment: (NOTE) Fact Sheet for Patients: EntrepreneurPulse.com.au  Fact Sheet for Healthcare Providers: IncredibleEmployment.be  This test is not yet approved or cleared by the [place] FDA and has been authorized for detection and/or diagnosis of SARS-CoV-2 by FDA under an Emergency Use Authorization (EUA). This EUA will remain in effect (meaning this test can be used) for the duration of the COVID-19 declaration under Section 564(b)(1) of the Act, 21 U.S.C. section 360bbb-3(b)(1), unless the  authorization is terminated or revoked.  Performed at [facility], [address] [place], [place] [address]   Blood Culture (routine x 2)     Status: None (Preliminary result)   Collection Time: 03/08/22  3:47 AM   Specimen: BLOOD LEFT ARM  Result Value Ref Range Status   Specimen Description BLOOD LEFT ARM  Final   Special Requests   Final    BOTTLES DRAWN AEROBIC AND ANAEROBIC Blood Culture adequate volume   Culture   Final    NO GROWTH 2 DAYS Performed at [facility], [address] [place], [place] [address]    Report Status PENDING  Incomplete  Blood Culture (routine x 2)     Status: None (Preliminary result)   Collection Time: 03/08/22  3:47 AM   Specimen: BLOOD RIGHT ARM  Result Value Ref Range Status   Specimen Description BLOOD RIGHT ARM  Final   Special Requests   Final    BOTTLES DRAWN AEROBIC AND ANAEROBIC Blood Culture adequate volume   Culture   Final    NO GROWTH 2 DAYS Performed at [facility], [address] [place], [place] [address]    Report Status PENDING  Incomplete  Urine Culture     Status: Abnormal    Collection Time: 03/08/22  4:28 AM   Specimen: In/Out Cath Urine  Result Value Ref Range Status   Specimen Description   Final    IN/OUT CATH URINE Performed at [facility], [address] [place], [place] [address]    Special Requests   Final    NONE Performed at [facility], [address], [place],  [address]    Culture MULTIPLE SPECIES PRESENT, SUGGEST RECOLLECTION (A)  Final   Report Status 03/09/2022 FINAL  Final         Radiology Studies: NM Pulmonary Perfusion  Result Date: 03/08/2022 CLINICAL DATA:  Altered level of consciousness, weakness, fever, elevated D-dimer EXAM: NUCLEAR MEDICINE PERFUSION LUNG SCAN TECHNIQUE: Perfusion images were obtained in multiple projections after intravenous injection of radiopharmaceutical. Ventilation scans intentionally deferred if perfusion scan and chest x-ray adequate for interpretation during COVID 19 epidemic. RADIOPHARMACEUTICALS:  3.99 mCi Tc-47mMAA IV COMPARISON:  Chest x-ray 03/07/2022 FINDINGS: Planar imaging of the chest was obtained in multiple projections after the intravenous administration of radiotracer. On the LPO and left lateral projections, there is a wedge-shaped perfusion defect within the superior segment of the left lower lobe, with no clear abnormality on corresponding x-ray. No other perfusion defects are identified. IMPRESSION: 1. Utilizing the PISAPED criteria, examination is positive for pulmonary embolus with wedge-shaped perfusion defect seen in the superior segment left lower lobe. Critical Value/emergent results were called by telephone at the time of interpretation on 03/08/2022 at 3:09 pm to provider [facility], who verbally acknowledged these results. Electronically Signed   By: MRanda NgoM.D.   On: 03/08/2022 15:20    Scheduled Meds:  apixaban  10 mg Oral BID   Followed by   [Derrill MemoON 03/15/2022] apixaban  5 mg Oral BID   ascorbic acid  1,000 mg Oral Daily   aspirin  EC  81 mg Oral Daily   atorvastatin  20 mg Oral Daily   benztropine  0.5 mg Oral BID   carvedilol  3.125 mg Oral BID   donepezil  10 mg Oral QHS   erythromycin   Both Eyes Q6H   [START ON 03/19/2022] fluPHENAZine decanoate  32.5 mg Intramuscular Q14 Days  fluticasone  2 spray Each Nare Daily   gabapentin  200 mg Oral TID   insulin aspart  0-15 Units Subcutaneous TID WC   insulin aspart  0-5 Units Subcutaneous QHS   levothyroxine  25 mcg Oral Q0600   pantoprazole  40 mg Oral Daily   polyethylene glycol  17 g Oral Daily   QUEtiapine  100 mg Oral Q1200   QUEtiapine  600 mg Oral QHS   senna  1 tablet Oral Daily   sertraline  100 mg Oral Daily   Continuous Infusions:  cefTRIAXone (ROCEPHIN)  IV Stopped (03/09/22 1237)     LOS: 2 days    Time spent: 35 mins    Margit Batte, MD [facility]   If 7PM-7AM, please contact night-coverage

## 2022-03-10 NOTE — Evaluation (Signed)
Physical Therapy Evaluation Patient Details Name: Elizabeth Gutierrez MRN: [id number] DOB: 1960/05/07 Today's Date: 03/10/2022  History of Present Illness  presented to ER secondary to AMS, weakness and fall; admitted for management of sepsis related to UTI, acute respiratory failure related to PE (on eliquis for management)  Clinical Impression  Patient resting in bed upon arrival to room; alert and oriented to self only.  Follows simple commands, very pleasant and cooperative throughout session. Noted deficits in insight, awareness and recall of new information.  No clinical indicators of pain appreciated.  Bilat UE/LE strength and ROM grossly symmetrical and WFL; no focal weakness appreciated (except R hand 4th finger trigger finger).  Able to complete bed mobility with mod indep; sit/stand, basic transfers and gait (40' x2) with RW, cga/close sup.  Demonstrates very forward flexed, kyphotic posture with forward head and mild rotation towards L. Short, shuffling steps with decreased cadence, decreased balance reactions, but no overt buckling or LOB. Easily distracted by external environment, often ceasing gait efforts to complete cognitive/conversational task. No SOB noted; sats >92% on RA throughout session  Would benefit from skilled PT to address above deficits and promote optimal return to PLOF.; Recommend transition to HHPT upon discharge from acute hospitalization.        Recommendations for follow up therapy are one component of a multi-disciplinary discharge planning process, led by the attending physician.  Recommendations may be updated based on patient status, additional functional criteria and insurance authorization.  Follow Up Recommendations Home health PT      Assistance Recommended at Discharge PRN  Patient can return home with the following  A little help with walking and/or transfers;A little help with bathing/dressing/bathroom    Equipment Recommendations Rolling walker (2  wheels)  Recommendations for Other Services       Functional Status Assessment Patient has had a recent decline in their functional status and demonstrates the ability to make significant improvements in function in a reasonable and predictable amount of time.     Precautions / Restrictions Precautions Precautions: Fall Restrictions Weight Bearing Restrictions: No      Mobility  Bed Mobility Overal bed mobility: Modified Independent                  Transfers Overall transfer level: Needs assistance Equipment used: Rolling walker (2 wheels) Transfers: Sit to/from Stand Sit to Stand: Min guard, Supervision                Ambulation/Gait Ambulation/Gait assistance: Min guard, Supervision Gait Distance (Feet): 40 Feet (x2) Assistive device: Rolling walker (2 wheels)         General Gait Details: very forward flexed, kyphotic posture with forward head and mild rotation towards L. Short, shuffling steps with decreased cadence, decreased balance reactions, but no overt buckling or LOB.  Easily distracted by external environment, often ceasing gait efforts to complete cognitive/conversational task.  No SOB noted; sats >92% on RA throughout session  Stairs            Wheelchair Mobility    Modified Rankin (Stroke Patients Only)       Balance Overall balance assessment: Needs assistance Sitting-balance support: No upper extremity supported, Feet supported Sitting balance-Leahy Scale: Good     Standing balance support: Bilateral upper extremity supported Standing balance-Leahy Scale: Fair                               Pertinent Vitals/Pain  Pain Assessment Pain Assessment: No/denies pain    Home Living Family/patient expects to be discharged to:: Group home                        Prior Function Prior Level of Function : Needs assist             Mobility Comments: Patient questionable historian; will verify details with  facility as available.  Notes reference ambulatory at baseline; patient suggests use of RW       Hand Dominance   Dominant Hand: Right    Extremity/Trunk Assessment   Upper Extremity Assessment Upper Extremity Assessment: Overall WFL for tasks assessed    Lower Extremity Assessment Lower Extremity Assessment: Overall WFL for tasks assessed (grossly 4/5 throguhout)       Communication   Communication: Expressive difficulties (intermittently mumbled, garbled, but communicates wants/needs with time/effort)  Cognition Arousal/Alertness: Awake/alert Behavior During Therapy: WFL for tasks assessed/performed Overall Cognitive Status: No family/caregiver present to determine baseline cognitive functioning                                 General Comments: Alert and oriented to self only; unaware of location, reason for admission.  Does follow simple commands, pleasant and cooperative; but notable deficits in insight, awareness and shor-term recall        General Comments      Exercises Other Exercises Other Exercises: Toilet transfer, ambulatory with RW, cga/close sup; sit/stand from standard toilet with grab bar, close sup; static standing balance for hygiene, hand hygiene at sink, cga/close sup.  Very limited ability to mobilize/maintain balance with movemetn outside immediate BOS, requiring contralateral UE to stabilize and extend reach   Assessment/Plan    PT Assessment Patient needs continued PT services  PT Problem List Decreased activity tolerance;Decreased balance;Decreased mobility;Decreased cognition;Decreased knowledge of use of DME;Decreased safety awareness;Decreased knowledge of precautions;Cardiopulmonary status limiting activity       PT Treatment Interventions DME instruction;Gait training;Functional mobility training;Therapeutic activities;Therapeutic exercise;Balance training;Patient/family education;Cognitive remediation    PT Goals (Current  goals can be found in the Care Plan section)  Acute Rehab PT Goals Patient Stated Goal: to go to the bathroom PT Goal Formulation: With patient Time For Goal Achievement: 03/24/22 Potential to Achieve Goals: Good    Frequency Min 2X/week     Co-evaluation               AM-PAC PT "6 Clicks" Mobility  Outcome Measure Help needed turning from your back to your side while in a flat bed without using bedrails?: None Help needed moving from lying on your back to sitting on the side of a flat bed without using bedrails?: None Help needed moving to and from a bed to a chair (including a wheelchair)?: A Little Help needed standing up from a chair using your arms (e.g., wheelchair or bedside chair)?: A Little Help needed to walk in hospital room?: A Little Help needed climbing 3-5 steps with a railing? : A Little 6 Click Score: 20    End of Session Equipment Utilized During Treatment: Gait belt Activity Tolerance: Patient tolerated treatment well Patient left: in chair;with call bell/phone within reach;with chair alarm set Nurse Communication: Mobility status PT Visit Diagnosis: History of falling (Z91.81);Muscle weakness (generalized) (M62.81);Difficulty in walking, not elsewhere classified (R26.2)    Time: 1029-1050 PT Time Calculation (min) (ACUTE ONLY): 21 min   Charges:  PT Evaluation $PT Eval Moderate Complexity: 1 Mod          Moishy Laday H. Owens Shark, PT, DPT, NCS 03/10/22, 11:04 AM [phone]

## 2022-03-10 NOTE — TOC Progression Note (Signed)
Transition of Care [facility]) - Progression Note    Patient Details  Name: Elizabeth Gutierrez MRN: [id number] Date of Birth: 16-Feb-1961  [facility] CM/SW Contact  Beverly Sessions, RN Phone Number: 03/10/2022, 1:35 PM  Clinical Narrative:    Therapy recommending home health Spoke with RN Gena Fray at [facility]. Her preference for home health is [place].  Referral made to [place] with [facility]. Butch Penny states that patient has a RW.  Per MD anticipated DC tomorrow - Call [facility] for Transport - [phone] - Fax Fl2 to [phone] - Have bedside RN call report to Butch Penny at [phone], or if there are any additional questions         Expected Discharge Plan and Services                                               Social Determinants of Health (SDOH) Interventions SDOH Screenings   Food Insecurity: No Food Insecurity (03/08/2022)  Housing: Low Risk  (03/09/2022)  Transportation Needs: No Transportation Needs (03/08/2022)  Utilities: Not At Risk (03/08/2022)  Tobacco Use: Medium Risk (03/08/2022)    Readmission Risk Interventions     No data to display

## 2022-03-10 NOTE — Evaluation (Signed)
Occupational Therapy Evaluation Patient Details Name: Elizabeth Gutierrez MRN: [id number] DOB: 04/29/1960 Today's Date: 03/10/2022   History of Present Illness presented to ER secondary to AMS, weakness and fall; admitted for management of sepsis related to UTI, acute respiratory failure related to PE (on eliquis for management)   Clinical Impression   Ms Jester was seen for OT evaluation this date. Pt is poor historian however per chart pt is from group home and was ambulatory. Pt presents to acute OT demonstrating impaired ADL performance and functional mobility 2/2 decreased activity tolerance and functional balance deficits. Pt is pleasantly confused t/o session, alert to self and location as hospital. Pt currently requires CGA + RW toilet t/f and pericare, MIN A thoroughness for pericare standing. MIN cues hand washing, assist to locate soap/towels. MIN A don/doff gown, assist for sequencing task. Pt would benefit from skilled OT to address noted impairments and functional limitations (see below for any additional details). Upon hospital discharge, recommend HHOT to maximize pt safety and return to PLOF.    Recommendations for follow up therapy are one component of a multi-disciplinary discharge planning process, led by the attending physician.  Recommendations may be updated based on patient status, additional functional criteria and insurance authorization.   Follow Up Recommendations  Home health OT     Assistance Recommended at Discharge Frequent or constant Supervision/Assistance  Patient can return home with the following A little help with walking and/or transfers;A little help with bathing/dressing/bathroom;Help with stairs or ramp for entrance    Functional Status Assessment  Patient has had a recent decline in their functional status and demonstrates the ability to make significant improvements in function in a reasonable and predictable amount of time.  Equipment Recommendations   None recommended by OT    Recommendations for Other Services       Precautions / Restrictions Precautions Precautions: Fall Restrictions Weight Bearing Restrictions: No      Mobility Bed Mobility Overal bed mobility: Modified Independent                  Transfers Overall transfer level: Needs assistance Equipment used: Rolling walker (2 wheels) Transfers: Sit to/from Stand Sit to Stand: Min guard                  Balance Overall balance assessment: Needs assistance Sitting-balance support: No upper extremity supported, Feet supported Sitting balance-Leahy Scale: Good     Standing balance support: No upper extremity supported, During functional activity Standing balance-Leahy Scale: Fair                             ADL either performed or assessed with clinical judgement   ADL Overall ADL's : Needs assistance/impaired                                       General ADL Comments: CGA + RW toilet t/f and pericare, MIN A thoroughness for pericare standing. MIN cues hand washing, assist to locate soap/towels. MIN A don/doff gown, assist for sequencing task      Pertinent Vitals/Pain Pain Assessment Pain Assessment: No/denies pain     Hand Dominance Right   Extremity/Trunk Assessment Upper Extremity Assessment Upper Extremity Assessment: Overall WFL for tasks assessed   Lower Extremity Assessment Lower Extremity Assessment: Overall WFL for tasks assessed       Communication  Communication Communication: Expressive difficulties (garbled)   Cognition Arousal/Alertness: Awake/alert Behavior During Therapy: WFL for tasks assessed/performed Overall Cognitive Status: No family/caregiver present to determine baseline cognitive functioning                                 General Comments: oriented to self and location as hospital however repeatedly asks about breakfast (already eaten) and calls staff wrong  names                Home Living Family/patient expects to be discharged to:: Group home                                        Prior Functioning/Environment Prior Level of Function : Needs assist             Mobility Comments: pt reports RW use, ? historian          OT Problem List: Decreased activity tolerance;Impaired balance (sitting and/or standing);Decreased safety awareness      OT Treatment/Interventions: Self-care/ADL training;Therapeutic exercise;Energy conservation;DME and/or AE instruction;Therapeutic activities;Patient/family education;Balance training    OT Goals(Current goals can be found in the care plan section) Acute Rehab OT Goals Patient Stated Goal: to eat breakfast OT Goal Formulation: With patient Time For Goal Achievement: 03/24/22 Potential to Achieve Goals: Good ADL Goals Pt Will Perform Grooming: with modified independence;standing Pt Will Perform Lower Body Dressing: with modified independence;sit to/from stand Pt Will Transfer to Toilet: with modified independence;ambulating;regular height toilet  OT Frequency: Min 2X/week    Co-evaluation              AM-PAC OT "6 Clicks" Daily Activity     Outcome Measure Help from another person eating meals?: None Help from another person taking care of personal grooming?: A Little Help from another person toileting, which includes using toliet, bedpan, or urinal?: A Little Help from another person bathing (including washing, rinsing, drying)?: A Little Help from another person to put on and taking off regular upper body clothing?: A Little Help from another person to put on and taking off regular lower body clothing?: A Little 6 Click Score: 19   End of Session Nurse Communication: Mobility status  Activity Tolerance: Patient tolerated treatment well Patient left: in bed;with call bell/phone within reach;with bed alarm set  OT Visit Diagnosis: Other abnormalities of gait  and mobility (R26.89);Muscle weakness (generalized) (M62.81)                Time: 8366-2947 OT Time Calculation (min): 34 min Charges:  OT General Charges $OT Visit: 1 Visit OT Evaluation $OT Eval Moderate Complexity: 1 Mod OT Treatments $Self Care/Home Management : 23-37 mins  Dessie Coma, M.S. OTR/L  03/10/22, 1:09 PM  ascom [phone]

## 2022-03-11 DIAGNOSIS — N390 Urinary tract infection, site not specified: Secondary | ICD-10-CM | POA: Diagnosis not present

## 2022-03-11 DIAGNOSIS — A4150 Gram-negative sepsis, unspecified: Secondary | ICD-10-CM | POA: Diagnosis not present

## 2022-03-11 LAB — CBC
HCT: 36.2 % (ref 36.0–46.0)
Hemoglobin: 10.5 g/dL — ABNORMAL LOW (ref 12.0–15.0)
MCH: 24.8 pg — ABNORMAL LOW (ref 26.0–34.0)
MCHC: 29 g/dL — ABNORMAL LOW (ref 30.0–36.0)
MCV: 85.4 fL (ref 80.0–100.0)
Platelets: 344 10*3/uL (ref 150–400)
RBC: 4.24 MIL/uL (ref 3.87–5.11)
RDW: 17.4 % — ABNORMAL HIGH (ref 11.5–15.5)
WBC: 8.6 10*3/uL (ref 4.0–10.5)
nRBC: 0 % (ref 0.0–0.2)

## 2022-03-11 LAB — CREATININE, SERUM
Creatinine, Ser: 1.17 mg/dL — ABNORMAL HIGH (ref 0.44–1.00)
GFR, Estimated: 53 mL/min — ABNORMAL LOW (ref >60)

## 2022-03-11 LAB — GLUCOSE, CAPILLARY
Glucose-Capillary: 119 mg/dL — ABNORMAL HIGH (ref 70–99)
Glucose-Capillary: 242 mg/dL — ABNORMAL HIGH (ref 70–99)

## 2022-03-11 MED ORDER — APIXABAN 5 MG PO TABS: ORAL_TABLET | ORAL | 3 refills | Status: DC

## 2022-03-11 NOTE — Discharge Summary (Addendum)
Physician Discharge Summary  Elizabeth Gutierrez TFT:[id number] DOB: 05-Apr-1960 DOA: 03/08/2022  PCP: Denton Lank, MD  Admit date: 03/08/2022  Discharge date: 03/11/2022  Admitted From: Home.  Disposition: Home Health Services  Recommendations for Outpatient Follow-up:  Follow up with PCP in 1-2 weeks. Please obtain BMP/CBC in one week. Advised to take Eliquis 10 mg bid for 4 days to complete 7 days course followed by Eliquis 5 mg bid for 6-9 months.  Home Health: Home PT/OT Equipment/Devices:None  Discharge Condition: Stable CODE STATUS:Full code Diet recommendation: Heart Healthy  Brief [facility] Course: This 61 yrs old female with PMH significant for Chronic systolic CHF, coronary artery disease, type 2 diabetes mellitus, hypertension, dyslipidemia, paranoid schizophrenia and hypothyroidism, who presented to the emergency room with acute onset of altered mental status and generalized weakness with subsequent fall.  She was having fever 100.6. on arrival in ED. UA had moderate white cells, She was empirically started on Rocephin for UTI. Patient also had some hypoxemia with oxygen saturation 87%, She was placed on 2 L oxygen.  VQ scan confirmed the  presence of pulmonary embolism, She is started on Eliquis.  Patient was admitted for further management.  Patient continued on IV ceftriaxone, IV fluids.  Mental status has significantly improved with resolution of UTI.  PT and OT recommended home health services. She has completed course of antibiotics for UTI.  Patient is being discharged home.  Home health services arranged.  Discharge Diagnoses:  Principal Problem:   Sepsis due to gram-negative UTI [facility]) Active Problems:   Generalized weakness   Acute respiratory failure with hypoxia (HCC)   Acute conjunctivitis   Chronic combined systolic (congestive) and diastolic (congestive) heart failure (HCC)   Benign essential HTN   Acute metabolic encephalopathy   Paranoid  schizophrenia (HCC)   Type 2 diabetes mellitus with hyperlipidemia (HCC)   Hypothyroidism   Chronic kidney disease, stage 3a (HCC)   Anxiety and depression   Essential hypertension  Sepsis secondary to UTI: Acute metabolic encephalopathy secondary to UTI: Likely secondary to UTI. Patient met sepsis criteria with tachycardia, fever, tachypnea and leukocytosis.  No lactic acidosis. She is hemodynamically stable.  Continue IV Rocephin x 3 days . Urine culture contaminated.  Suggest recollection. She seems back to her baseline mental status. Sepsis physiology > Resolved.   Generalized weakness: PT/OT > HH PT/OT   CKD stage IIIa: Creatinine at baseline. Avoid nephrotoxic medications.   Acute hypoxic respiratory failure, multifactorial: Patient does have chronic combined systolic and diastolic heart failure. She continues to have shortness of breath.  Elevated D-dimer. Chest x-ray did not show any evidence of CHF or infection. Echo recently done in 2022 shows LVEF 30 to 35% with grade 1 diastolic dysfunction. VQ scan confirms the presence of pulmonary embolism. Continued on supplemental oxygen, weaned down to room air now. Continue Eliquis 10 mg bid X 7 days followed by Eliquis 5 mg bid.   Acute Conjunctivitis: Continue erythromycin eye ointment.   Essential hypertension: Continue Coreg 3.125 mg  q12hr.   Type 2 diabetes with hyperglycemia: Hemoglobin A1c 9.8.   Continue sliding scale insulin. Carb modified diet.   Anxiety and depression: Continue Zoloft, Seroquel and Valium.   Hypothyroidism: Continue Synthroid.   Paranoid Schizophrenia : Mild mental retardation: Continue psychotropic medications.  Discharge Instructions  Discharge Instructions     Call MD for:  difficulty breathing, headache or visual disturbances   Complete by: As directed    Call MD for:  persistant dizziness or  light-headedness   Complete by: As directed    Call MD for:  persistant nausea  and vomiting   Complete by: As directed    Diet - low sodium heart healthy   Complete by: As directed    Diet Carb Modified   Complete by: As directed    Discharge instructions   Complete by: As directed    Advised to follow up PCP in one week. Advised to take Eliquis 10 mg bid for 4 days to complete 7 days course followed by Eliquis 5 mg bid for 6-9 months.   Increase activity slowly   Complete by: As directed       Allergies as of 03/11/2022       Reactions   Bee Venom         Medication List     STOP taking these medications    dextromethorphan-guaiFENesin 10-100 MG/5ML liquid Commonly known as: ROBITUSSIN-DM       TAKE these medications    acetaminophen 325 MG tablet Commonly known as: TYLENOL Take 650 mg by mouth every 4 (four) hours as needed for moderate pain, fever or headache.   apixaban 5 MG Tabs tablet Commonly known as: ELIQUIS Take Eliquis 10 mg bid for 4 days followed by 5 mg bid Start taking on: March 15, 2022   ascorbic acid 1000 MG tablet Commonly known as: VITAMIN C Take 1 tablet by mouth daily.   aspirin 81 MG tablet Take 81 mg by mouth daily.   atorvastatin 20 MG tablet Commonly known as: LIPITOR Take 20 mg by mouth daily.   Belsomra 15 MG Tabs Generic drug: Suvorexant Take 1 tablet by mouth at bedtime.   benztropine 0.5 MG tablet Commonly known as: COGENTIN Take 0.5 mg by mouth 2 (two) times daily.   carvedilol 3.125 MG tablet Commonly known as: COREG Take 3.125 mg by mouth 2 (two) times daily.   divalproex 125 MG DR tablet Commonly known as: DEPAKOTE Take 250 mg by mouth 2 (two) times daily.   docusate sodium 100 MG capsule Commonly known as: COLACE Take 100 mg by mouth 2 (two) times daily as needed for mild constipation.   donepezil 10 MG tablet Commonly known as: ARICEPT Take 1 tablet by mouth at bedtime.   fluPHENAZine decanoate 25 MG/ML injection Commonly known as: PROLIXIN Inject 31.25 mg into the muscle  every 14 (fourteen) days.   fluticasone 50 MCG/ACT nasal spray Commonly known as: FLONASE Place 2 sprays into both nostrils daily.   furosemide 20 MG tablet Commonly known as: Lasix Take 1 tablet (20 mg total) by mouth daily as needed for fluid or edema.   gabapentin 100 MG capsule Commonly known as: NEURONTIN Take 200 mg by mouth 3 (three) times daily.   ibuprofen 600 MG tablet Commonly known as: ADVIL Take 600 mg by mouth every 6 (six) hours as needed.   Jardiance 25 MG Tabs tablet Generic drug: empagliflozin Take 25 mg by mouth daily.   ketoconazole 2 % cream Commonly known as: NIZORAL Apply 1 application topically in the morning and at bedtime.   levothyroxine 25 MCG tablet Commonly known as: SYNTHROID Take 1 tablet (25 mcg total) by mouth daily before breakfast.   metFORMIN 500 MG 24 hr tablet Commonly known as: GLUCOPHAGE-XR Take 500 mg by mouth daily with breakfast.   nystatin cream Commonly known as: MYCOSTATIN Apply 1 application topically 2 (two) times daily.   omeprazole 20 MG capsule Commonly known as: PRILOSEC Take 20 mg by  mouth 2 (two) times daily.   polyethylene glycol 17 g packet Commonly known as: MIRALAX / GLYCOLAX Take 17 g by mouth daily.   QUEtiapine 200 MG tablet Commonly known as: SEROQUEL Take 600 mg by mouth at bedtime.   QUEtiapine 100 MG tablet Commonly known as: SEROQUEL Take 100 mg by mouth daily. At noon   RA No Flush Niacin 500 MG tablet Generic drug: niacin Take 1,000 mg by mouth 2 (two) times daily.   senna 8.6 MG tablet Commonly known as: SENOKOT Take 1 tablet by mouth daily.   sertraline 100 MG tablet Commonly known as: ZOLOFT Take 100 mg by mouth in the morning.   zinc sulfate 220 (50 Zn) MG capsule Take 220 mg by mouth daily.               Durable Medical Equipment  (From admission, onward)           Start     Ordered   03/10/22 1106  For home use only DME Walker rolling  Once       Question  Answer Comment  Walker: With [place] Wheels   Patient needs a walker to treat with the following condition Muscle weakness (generalized)      03/10/22 1105            Follow-up Information     Denton Lank, MD. Go on 03/16/2022.   Specialty: Family Medicine Why: Appointment @ 1:40 pm on 03/16/22. Contact information: [address] [address] [phone]                Allergies  Allergen Reactions   Bee Venom     Consultations: None   Procedures/Studies: NM Pulmonary Perfusion  Result Date: 03/08/2022 CLINICAL DATA:  Altered level of consciousness, weakness, fever, elevated D-dimer EXAM: NUCLEAR MEDICINE PERFUSION LUNG SCAN TECHNIQUE: Perfusion images were obtained in multiple projections after intravenous injection of radiopharmaceutical. Ventilation scans intentionally deferred if perfusion scan and chest x-ray adequate for interpretation during COVID 19 epidemic. RADIOPHARMACEUTICALS:  3.99 mCi Tc-67mMAA IV COMPARISON:  Chest x-ray 03/07/2022 FINDINGS: Planar imaging of the chest was obtained in multiple projections after the intravenous administration of radiotracer. On the LPO and left lateral projections, there is a wedge-shaped perfusion defect within the superior segment of the left lower lobe, with no clear abnormality on corresponding x-ray. No other perfusion defects are identified. IMPRESSION: 1. Utilizing the PISAPED criteria, examination is positive for pulmonary embolus with wedge-shaped perfusion defect seen in the superior segment left lower lobe. Critical Value/emergent results were called by telephone at the time of interpretation on 03/08/2022 at 3:09 pm [facility] verbally acknowledged these results. Electronically Signed   By: MRanda NgoM.D.   On: 03/08/2022 15:20   CT Head Wo Contrast  Result Date: 03/07/2022 CLINICAL DATA:  Fall, back pain EXAM: CT HEAD WITHOUT CONTRAST CT CERVICAL SPINE WITHOUT  CONTRAST TECHNIQUE: Multidetector CT imaging of the head and cervical spine was performed following the standard protocol without intravenous contrast. Multiplanar CT image reconstructions of the cervical spine were also generated. RADIATION DOSE REDUCTION: This exam was performed according to the departmental dose-optimization program which includes automated exposure control, adjustment of the mA and/or kV according to patient size and/or use of iterative reconstruction technique. COMPARISON:  CT head dated 05/07/2021 FINDINGS: CT HEAD FINDINGS Brain: No evidence of acute infarction, hemorrhage, hydrocephalus, extra-axial collection or mass lesion/mass effect. Mild subcortical white matter and periventricular  small vessel ischemic changes. Vascular: Intracranial atherosclerosis. Skull: Normal. Negative for fracture or focal lesion. Sinuses/Orbits: The visualized paranasal sinuses are essentially clear. The mastoid air cells are unopacified. Other: None. CT CERVICAL SPINE FINDINGS Alignment: Normal cervical lordosis. Skull base and vertebrae: No acute fracture. No primary bone lesion or focal pathologic process. Soft tissues and spinal canal: No prevertebral fluid or swelling. No visible canal hematoma. Disc levels: Mild degenerative changes at C5-6. Spinal canal is patent. Upper chest: Visualized lung apices are clear. Other: Small bilateral thyroid nodules measuring up to 10 mm, likely reflecting multinodular goiter. No follow-up is recommended. IMPRESSION: No evidence of acute intracranial abnormality. Mild small vessel ischemic changes. No traumatic injury to the cervical spine. Mild degenerative changes at C5-6. Electronically Signed   By: Julian Hy M.D.   On: 03/07/2022 20:34   CT Cervical Spine Wo Contrast  Result Date: 03/07/2022 CLINICAL DATA:  Fall, back pain EXAM: CT HEAD WITHOUT CONTRAST CT CERVICAL SPINE WITHOUT CONTRAST TECHNIQUE: Multidetector CT imaging of the head and cervical spine  was performed following the standard protocol without intravenous contrast. Multiplanar CT image reconstructions of the cervical spine were also generated. RADIATION DOSE REDUCTION: This exam was performed according to the departmental dose-optimization program which includes automated exposure control, adjustment of the mA and/or kV according to patient size and/or use of iterative reconstruction technique. COMPARISON:  CT head dated 05/07/2021 FINDINGS: CT HEAD FINDINGS Brain: No evidence of acute infarction, hemorrhage, hydrocephalus, extra-axial collection or mass lesion/mass effect. Mild subcortical white matter and periventricular small vessel ischemic changes. Vascular: Intracranial atherosclerosis. Skull: Normal. Negative for fracture or focal lesion. Sinuses/Orbits: The visualized paranasal sinuses are essentially clear. The mastoid air cells are unopacified. Other: None. CT CERVICAL SPINE FINDINGS Alignment: Normal cervical lordosis. Skull base and vertebrae: No acute fracture. No primary bone lesion or focal pathologic process. Soft tissues and spinal canal: No prevertebral fluid or swelling. No visible canal hematoma. Disc levels: Mild degenerative changes at C5-6. Spinal canal is patent. Upper chest: Visualized lung apices are clear. Other: Small bilateral thyroid nodules measuring up to 10 mm, likely reflecting multinodular goiter. No follow-up is recommended. IMPRESSION: No evidence of acute intracranial abnormality. Mild small vessel ischemic changes. No traumatic injury to the cervical spine. Mild degenerative changes at C5-6. Electronically Signed   By: Julian Hy M.D.   On: 03/07/2022 20:34   DG Chest 1 View  Result Date: 03/07/2022 CLINICAL DATA:  Fall, back pain EXAM: CHEST  1 VIEW COMPARISON:  06/30/2020 FINDINGS: Lungs are clear.  No pleural effusion or pneumothorax. Cardiomegaly. IMPRESSION: Cardiomegaly. No evidence of acute cardiopulmonary disease. Electronically Signed   By:  Julian Hy M.D.   On: 03/07/2022 20:32   DG Lumbar Spine 2-3 Views  Result Date: 03/07/2022 CLINICAL DATA:  Fall EXAM: LUMBAR SPINE - 2-3 VIEW COMPARISON:  Lumbar spine x-ray 12/01/2014 FINDINGS: There is no evidence of lumbar spine fracture. Alignment is normal. Intervertebral disc spaces are maintained. IMPRESSION: Negative. Electronically Signed   By: Ronney Asters M.D.   On: 03/07/2022 20:31   DG Femur Min 2 Views Right  Result Date: 03/07/2022 CLINICAL DATA:  Fall EXAM: RIGHT FEMUR 2 VIEWS COMPARISON:  None Available. FINDINGS: No fracture or dislocation is seen. Joint spaces are preserved. Visualized soft tissues are within normal limits. IMPRESSION: Negative. Electronically Signed   By: Julian Hy M.D.   On: 03/07/2022 20:31   DG Femur Min 2 Views Left  Result Date: 03/07/2022 CLINICAL DATA:  Fall EXAM:  LEFT FEMUR 2 VIEWS COMPARISON:  None Available. FINDINGS: No fracture or dislocation is seen. Hip joint space is preserved.  Degenerative changes of the knee. Visualized soft tissues are within normal limits. IMPRESSION: Negative. Electronically Signed   By: Julian Hy M.D.   On: 03/07/2022 20:31   DG Pelvis 1-2 Views  Result Date: 03/07/2022 CLINICAL DATA:  Fall, back pain EXAM: PELVIS - 1-2 VIEW COMPARISON:  None Available. FINDINGS: No fracture or dislocation is seen. Bilobed joint spaces are preserved. Visualized bony pelvis appears intact. IMPRESSION: Negative. Electronically Signed   By: Julian Hy M.D.   On: 03/07/2022 20:30     Subjective: Patient was seen and examined at bedside.  Overnight events noted.   Patient reports doing much better with treatment. She is  being discharged home and services arranged.  Discharge Exam: Vitals:   03/11/22 0445 03/11/22 0748  BP: 121/67 130/82  Pulse: 91 88  Resp: 17 20  Temp: 97.7 F (36.5 C) 98 F (36.7 C)  SpO2: 98% 100%   Vitals:   03/10/22 1622 03/10/22 1931 03/11/22 0445 03/11/22 0748  BP:  106/69 (!) 112/58 121/67 130/82  Pulse: 80 84 91 88  Resp: _0 Temp: 98.5 F (36.9 C) 98.8 F (37.1 C) 97.7 F (36.5 C) 98 F (36.7 C)  TempSrc: Oral Oral Oral Oral  SpO2: 94% 100% 98% 100%  Weight:      Height:        General: Pt is alert, awake, not in acute distress Cardiovascular: RRR, S1/S2 +, no rubs, no gallops Respiratory: CTA bilaterally, no wheezing, no rhonchi Abdominal: Soft, NT, ND, bowel sounds + Extremities: no edema, no cyanosis    The results of significant diagnostics from this hospitalization (including imaging, microbiology, ancillary and laboratory) are listed below for reference.     Microbiology: Recent Results (from the past 240 hour(s))  Resp panel by RT-PCR (RSV, Flu A&B, Covid) Anterior Nasal Swab     Status: None   Collection Time: 03/07/22  7:44 PM   Specimen: Anterior Nasal Swab  Result Value Ref Range Status   SARS Coronavirus 2 by RT PCR NEGATIVE NEGATIVE Final    Comment: (NOTE) SARS-CoV-2 target nucleic acids are NOT DETECTED.  The SARS-CoV-2 RNA is generally detectable in upper respiratory specimens during the acute phase of infection. The lowest concentration of SARS-CoV-2 viral copies this assay can detect is 138 copies/mL. A negative result does not preclude SARS-Cov-2 infection and should not be used as the sole basis for treatment or other patient management decisions. A negative result may occur with  improper specimen collection/handling, submission of specimen other than nasopharyngeal swab, presence of viral mutation(s) within the areas targeted by this assay, and inadequate number of viral copies(<138 copies/mL). A negative result must be combined with clinical observations, patient history, and epidemiological information. The expected result is Negative.  Fact Sheet for Patients:  EntrepreneurPulse.com.au  Fact Sheet for Healthcare Providers:  IncredibleEmployment.be  This  test is no t yet approved or cleared by the [place] FDA and  has been authorized for detection and/or diagnosis of SARS-CoV-2 by FDA under an Emergency Use Authorization (EUA). This EUA will remain  in effect (meaning this test can be used) for the duration of the COVID-19 declaration under Section 564(b)(1) of the Act, 21 U.S.C.section 360bbb-3(b)(1), unless the authorization is terminated  or revoked sooner.       Influenza A by PCR NEGATIVE NEGATIVE Final   Influenza B  by PCR NEGATIVE NEGATIVE Final    Comment: (NOTE) The Xpert Xpress SARS-CoV-2/FLU/RSV plus assay is intended as an aid in the diagnosis of influenza from Nasopharyngeal swab specimens and should not be used as a sole basis for treatment. Nasal washings and aspirates are unacceptable for Xpert Xpress SARS-CoV-2/FLU/RSV testing.  Fact Sheet for Patients: EntrepreneurPulse.com.au  Fact Sheet for Healthcare Providers: IncredibleEmployment.be  This test is not yet approved or cleared by the [place] FDA and has been authorized for detection and/or diagnosis of SARS-CoV-2 by FDA under an Emergency Use Authorization (EUA). This EUA will remain in effect (meaning this test can be used) for the duration of the COVID-19 declaration under Section 564(b)(1) of the Act, 21 U.S.C. section 360bbb-3(b)(1), unless the authorization is terminated or revoked.     Resp Syncytial Virus by PCR NEGATIVE NEGATIVE Final    Comment: (NOTE) Fact Sheet for Patients: EntrepreneurPulse.com.au  Fact Sheet for Healthcare Providers: IncredibleEmployment.be  This test is not yet approved or cleared by the [place] FDA and has been authorized for detection and/or diagnosis of SARS-CoV-2 by FDA under an Emergency Use Authorization (EUA). This EUA will remain in effect (meaning this test can be used) for the duration of the COVID-19 declaration under  Section 564(b)(1) of the Act, 21 U.S.C. section 360bbb-3(b)(1), unless the authorization is terminated or revoked.  Performed at [facility], [address] [place], [place] [address]   Blood Culture (routine x 2)     Status: None (Preliminary result)   Collection Time: 03/08/22  3:47 AM   Specimen: BLOOD LEFT ARM  Result Value Ref Range Status   Specimen Description BLOOD LEFT ARM  Final   Special Requests   Final    BOTTLES DRAWN AEROBIC AND ANAEROBIC Blood Culture adequate volume   Culture   Final    NO GROWTH 3 DAYS Performed at [facility], [address] [place], [place] [address]    Report Status PENDING  Incomplete  Blood Culture (routine x 2)     Status: None (Preliminary result)   Collection Time: 03/08/22  3:47 AM   Specimen: BLOOD RIGHT ARM  Result Value Ref Range Status   Specimen Description BLOOD RIGHT ARM  Final   Special Requests   Final    BOTTLES DRAWN AEROBIC AND ANAEROBIC Blood Culture adequate volume   Culture   Final    NO GROWTH 3 DAYS Performed at [facility], [address] [place], [place] [address]    Report Status PENDING  Incomplete  Urine Culture     Status: Abnormal   Collection Time: 03/08/22  4:28 AM   Specimen: In/Out Cath Urine  Result Value Ref Range Status   Specimen Description   Final    IN/OUT CATH URINE Performed at [facility], [address], [place], [place] [address]    Special Requests   Final    NONE Performed at [facility], [address] [place], [place] [address]    Culture MULTIPLE SPECIES PRESENT, SUGGEST RECOLLECTION (A)  Final   Report Status 03/09/2022 FINAL  Final     Labs: BNP (last 3 results) Recent Labs    03/08/22 1721  BNP 335.4*   Basic Metabolic Panel: Recent Labs  Lab 03/07/22 1944 03/07/22 2357 03/09/22 1058 03/10/22 0627 03/11/22 0526  NA 141 141 141  --   --   K 4.1 4.1 4.3  --   --   CL 107 109 110  --   --  CO2 22 21* 22   --   --   GLUCOSE 217* 192* 240*  --   --   BUN 23 24* 26*  --   --   CREATININE 1.56* 1.45* 1.41* 1.23* 1.17*  CALCIUM 9.8 10.0 9.9  --   --    Liver Function Tests: Recent Labs  Lab 03/07/22 1944  AST 12*  ALT 10  ALKPHOS 97  BILITOT 0.9  PROT 8.2*  ALBUMIN 3.3*   No results for input(s): "LIPASE", "AMYLASE" in the last 168 hours. No results for input(s): "AMMONIA" in the last 168 hours. CBC: Recent Labs  Lab 03/07/22 1944 03/08/22 1721 03/09/22 1058 03/10/22 0627 03/11/22 0526  WBC 22.4* 22.9* 21.7* 13.4* 8.6  NEUTROABS 18.8*  --   --   --   --   HGB 10.4* 11.7* 10.8* 9.9* 10.5*  HCT 36.5 40.2 37.3 34.4* 36.2  MCV 88.0 86.6 87.1 86.4 85.4  PLT 376 371 335 320 344   Cardiac Enzymes: No results for input(s): "CKTOTAL", "CKMB", "CKMBINDEX", "TROPONINI" in the last 168 hours. BNP: Invalid input(s): "POCBNP" CBG: Recent Labs  Lab 03/10/22 1156 03/10/22 1654 03/10/22 2116 03/11/22 0750 03/11/22 1149  GLUCAP 219* 149* 137* 119* 242*   D-Dimer No results for input(s): "DDIMER" in the last 72 hours. Hgb A1c No results for input(s): "HGBA1C" in the last 72 hours. Lipid Profile No results for input(s): "CHOL", "HDL", "LDLCALC", "TRIG", "CHOLHDL", "LDLDIRECT" in the last 72 hours. Thyroid function studies No results for input(s): "TSH", "T4TOTAL", "T3FREE", "THYROIDAB" in the last 72 hours.  Invalid input(s): "FREET3" Anemia work up No results for input(s): "VITAMINB12", "FOLATE", "FERRITIN", "TIBC", "IRON", "RETICCTPCT" in the last 72 hours. Urinalysis    Component Value Date/Time   COLORURINE STRAW (A) 03/08/2022 0428   APPEARANCEUR CLEAR (A) 03/08/2022 0428   APPEARANCEUR Clear 04/23/2014 1009   LABSPEC 1.010 03/08/2022 0428   LABSPEC 1.003 04/23/2014 1009   PHURINE 6.0 03/08/2022 0428   GLUCOSEU >=500 (A) 03/08/2022 0428   GLUCOSEU Negative 04/23/2014 1009   HGBUR NEGATIVE 03/08/2022 0428   BILIRUBINUR NEGATIVE 03/08/2022 0428   BILIRUBINUR Negative  04/23/2014 1009   KETONESUR 20 (A) 03/08/2022 0428   PROTEINUR 30 (A) 03/08/2022 0428   NITRITE NEGATIVE 03/08/2022 0428   LEUKOCYTESUR MODERATE (A) 03/08/2022 0428   LEUKOCYTESUR Negative 04/23/2014 1009   Sepsis Labs Recent Labs  Lab 03/08/22 1721 03/09/22 1058 03/10/22 0627 03/11/22 0526  WBC 22.9* 21.7* 13.4* 8.6   Microbiology Recent Results (from the past 240 hour(s))  Resp panel by RT-PCR (RSV, Flu A&B, Covid) Anterior Nasal Swab     Status: None   Collection Time: 03/07/22  7:44 PM   Specimen: Anterior Nasal Swab  Result Value Ref Range Status   SARS Coronavirus 2 by RT PCR NEGATIVE NEGATIVE Final    Comment: (NOTE) SARS-CoV-2 target nucleic acids are NOT DETECTED.  The SARS-CoV-2 RNA is generally detectable in upper respiratory specimens during the acute phase of infection. The lowest concentration of SARS-CoV-2 viral copies this assay can detect is 138 copies/mL. A negative result does not preclude SARS-Cov-2 infection and should not be used as the sole basis for treatment or other patient management decisions. A negative result may occur with  improper specimen collection/handling, submission of specimen other than nasopharyngeal swab, presence of viral mutation(s) within the areas targeted by this assay, and inadequate number of viral copies(<138 copies/mL). A negative result must be combined with clinical observations, patient history, and epidemiological information. The expected  result is Negative.  Fact Sheet for Patients:  EntrepreneurPulse.com.au  Fact Sheet for Healthcare Providers:  IncredibleEmployment.be  This test is no t yet approved or cleared by the [place] FDA and  has been authorized for detection and/or diagnosis of SARS-CoV-2 by FDA under an Emergency Use Authorization (EUA). This EUA will remain  in effect (meaning this test can be used) for the duration of the COVID-19 declaration under Section  564(b)(1) of the Act, 21 U.S.C.section 360bbb-3(b)(1), unless the authorization is terminated  or revoked sooner.       Influenza A by PCR NEGATIVE NEGATIVE Final   Influenza B by PCR NEGATIVE NEGATIVE Final    Comment: (NOTE) The Xpert Xpress SARS-CoV-2/FLU/RSV plus assay is intended as an aid in the diagnosis of influenza from Nasopharyngeal swab specimens and should not be used as a sole basis for treatment. Nasal washings and aspirates are unacceptable for Xpert Xpress SARS-CoV-2/FLU/RSV testing.  Fact Sheet for Patients: EntrepreneurPulse.com.au  Fact Sheet for Healthcare Providers: IncredibleEmployment.be  This test is not yet approved or cleared by the [place] FDA and has been authorized for detection and/or diagnosis of SARS-CoV-2 by FDA under an Emergency Use Authorization (EUA). This EUA will remain in effect (meaning this test can be used) for the duration of the COVID-19 declaration under Section 564(b)(1) of the Act, 21 U.S.C. section 360bbb-3(b)(1), unless the authorization is terminated or revoked.     Resp Syncytial Virus by PCR NEGATIVE NEGATIVE Final    Comment: (NOTE) Fact Sheet for Patients: EntrepreneurPulse.com.au  Fact Sheet for Healthcare Providers: IncredibleEmployment.be  This test is not yet approved or cleared by the [place] FDA and has been authorized for detection and/or diagnosis of SARS-CoV-2 by FDA under an Emergency Use Authorization (EUA). This EUA will remain in effect (meaning this test can be used) for the duration of the COVID-19 declaration under Section 564(b)(1) of the Act, 21 U.S.C. section 360bbb-3(b)(1), unless the authorization is terminated or revoked.  Performed at [facility], [address] [place], [place] [address]   Blood Culture (routine x 2)     Status: None (Preliminary result)   Collection Time: 03/08/22  3:47 AM    Specimen: BLOOD LEFT ARM  Result Value Ref Range Status   Specimen Description BLOOD LEFT ARM  Final   Special Requests   Final    BOTTLES DRAWN AEROBIC AND ANAEROBIC Blood Culture adequate volume   Culture   Final    NO GROWTH 3 DAYS Performed at [facility], [address], [place], [place] [address]    Report Status PENDING  Incomplete  Blood Culture (routine x 2)     Status: None (Preliminary result)   Collection Time: 03/08/22  3:47 AM   Specimen: BLOOD RIGHT ARM  Result Value Ref Range Status   Specimen Description BLOOD RIGHT ARM  Final   Special Requests   Final    BOTTLES DRAWN AEROBIC AND ANAEROBIC Blood Culture adequate volume   Culture   Final    NO GROWTH 3 DAYS Performed at [facility], [address], [place], [place] [address]    Report Status PENDING  Incomplete  Urine Culture     Status: Abnormal   Collection Time: 03/08/22  4:28 AM   Specimen: In/Out Cath Urine  Result Value Ref Range Status   Specimen Description   Final    IN/OUT CATH URINE Performed at [facility], [address] [place], [place] [address]  Special Requests   Final    NONE Performed at [facility], [address] [place], [place] [address]    Culture MULTIPLE SPECIES PRESENT, SUGGEST RECOLLECTION (A)  Final   Report Status 03/09/2022 FINAL  Final     Time coordinating discharge: Over 30 minutes  SIGNED:   Shawna Clamp, MD  [facility] 03/11/2022, 2:53 PM Pager   If 7PM-7AM, please contact night-coverage

## 2022-03-11 NOTE — Progress Notes (Signed)
Pt discharged to group home in stable condition. Discharge instructions given. Report called to Adonis Brook from facility. No immediate questions or concerns at this time. Discharged from unit via wheelchair. Legal guardian (mother) notified of DC as well.

## 2022-03-11 NOTE — Care Management Important Message (Signed)
Important Message  Patient Details  Name: Elizabeth Gutierrez MRN: [id number] Date of Birth: September 12, 1960   Medicare Important Message Given:  Other (see comment)  Called to review the Important Message from Medicare with the patient's HCPOA, mother Enslee Bibbins but was unable to leave message as the mailbox is full.    Juliann Pulse A Kee Drudge 03/11/2022, 2:32 PM

## 2022-03-11 NOTE — TOC Transition Note (Signed)
Transition of Care [facility]) - CM/SW Discharge Note   Patient Details  Name: Elizabeth Gutierrez MRN: [id number] Date of Birth: 12-Jan-1961  [facility] of [facility] CM/SW Contact:  Magnus Ivan, LCSW Phone Number: 03/11/2022, 1:39 PM   Clinical Narrative:    Patient to [facility] back to [name] today. Confirmed with [employer] and RN confirmed with caregiver at bedside that patient does have a RW at home. Confirmed with Pharmacist that patient's Eliquis copay shows as $0.  Attempted call to [place], no answer and voicemail box is full. Left VM for Butch Penny with [place] with update - per RN, RN has already called report to [facility]. FL2 and DC Summary faxed.  Asked RN to call Gwyndolyn Saxon for transport when patient is ready for transport.    Final next level of care: Group Home Barriers to Discharge: Barriers Resolved   Patient Goals and CMS Choice CMS Medicare.gov Compare Post Acute Care list provided to:: Legal Guardian    Discharge Placement                      Patient and family notified of of transfer: 03/11/22  Discharge Plan and Services Additional resources added to the After Visit Summary for                            [facility] Arranged: PT, OT [facility] Agency: [place] Date [place]: 03/11/22   Representative spoke with at [place]: [place]  Social Determinants of Health ([place]) Interventions [place]: No Food Insecurity (03/08/2022)  Housing: Low Risk  (03/09/2022)  Transportation Needs: No Transportation Needs (03/08/2022)  Utilities: Not At Risk (03/08/2022)  Tobacco Use: Medium Risk (03/08/2022)     Readmission Risk Interventions     No data to display

## 2022-03-11 NOTE — NC FL2 (Signed)
[place] LEVEL OF CARE FORM     IDENTIFICATION  Patient Name: Elizabeth Gutierrez Birthdate: October 10, 1960 Sex: female Admission Date (Current Location): 03/08/2022  [facility] and [place] Number:  Engineering geologist and Address:  [facility], [address], [place], [place] [address]      Provider Number: [id number]  Attending Physician Name and Address:  Shawna Clamp, MD  Relative Name and Phone Number:  Mengel,Joyce  Mother  [phone]    Current Level of Care: Hospital Recommended Level of Care: [facility] Prior Approval Number:    Date Approved/Denied:   PASRR Number:    Discharge Plan:      Current Diagnoses: Patient Active Problem List   Diagnosis Date Noted   Sepsis due to gram-negative UTI ([place]) 03/08/2022   Generalized weakness 03/08/2022   Anxiety and depression 03/08/2022   Essential hypertension 03/08/2022   Acute conjunctivitis 03/08/2022   Malnutrition of moderate degree 11/28/2019   Goals of care, counseling/discussion    Palliative care by specialist    DNR (do not resuscitate) discussion    Hypercalcemia    Tachycardia    Weakness    Acute kidney injury superimposed on CKD ([place])    Acute cystitis with hematuria    Acute respiratory failure with hypoxia ([place]) 11/22/2019   COVID-19 11/22/2019   Bilateral pneumonia 09/28/2019   Shortness of breath 09/26/2019   Hyperlipidemia    Paranoid schizophrenia ([place])    Type 2 diabetes mellitus with hyperlipidemia ([place])    Hypothyroidism    Chronic kidney disease, stage 3a ([place])    Acute on chronic respiratory failure with hypoxia ([place])    Acute metabolic encephalopathy 89/38/1017   Altered mental state 08/17/2016   DDD (degenerative disc disease), lumbar 07/09/2015   Facet syndrome, lumbar 07/09/2015   Lumbar radiculopathy 07/09/2015   Sacroiliac joint dysfunction 07/09/2015   Spinal stenosis, lumbar region, 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   Diabetic  neuropathy ([place]) 07/09/2015   CAD in native artery 06/16/2015   Controlled type 2 diabetes mellitus without complication ([place]) 51/04/5850   Benign essential HTN 06/16/2015   Obstructive apnea 01/05/2015   Esophagitis, reflux 08/06/2014   Edema leg 05/14/2014   Chronic combined systolic (congestive) and diastolic (congestive) heart failure ([place]) 05/14/2014   TI (tricuspid incompetence) 05/14/2014   Chest pain 12/13/2013   Combined fat and carbohydrate induced hyperlipemia 10/09/2013   Breath shortness 10/09/2013    Orientation RESPIRATION BLADDER Height & Weight     Self, Time, Situation, Place  Normal Incontinent Weight: 150 lb (68 kg) Height:  6' (182.9 cm)  BEHAVIORAL SYMPTOMS/MOOD NEUROLOGICAL BOWEL NUTRITION STATUS      Incontinent Diet (heart healthy/carb modified)  AMBULATORY STATUS COMMUNICATION OF NEEDS Skin   Limited Assist Verbally Normal                       Personal Care Assistance Level of Assistance  Bathing, Feeding, Dressing Bathing Assistance: Limited assistance Feeding assistance: Limited assistance Dressing Assistance: Limited assistance     Functional Limitations Info             SPECIAL CARE FACTORS FREQUENCY  PT (By licensed PT), OT (By licensed OT)     PT Frequency: home health OT Frequency: home health            Contractures      Additional Factors Info  Code Status, Allergies Code Status Info: full Allergies Info: Bee Venom  Current Medications (03/11/2022):   TAKE these medications     acetaminophen 325 MG tablet Commonly known as: TYLENOL Take 650 mg by mouth every 4 (four) hours as needed for moderate pain, fever or headache.    apixaban 5 MG Tabs tablet Commonly known as: ELIQUIS Take Eliquis 10 mg bid for 4 days followed by 5 mg bid Start taking on: March 15, 2022    ascorbic acid 1000 MG tablet Commonly known as: VITAMIN C Take 1 tablet by mouth daily.    aspirin 81 MG tablet Take 81 mg by  mouth daily.    atorvastatin 20 MG tablet Commonly known as: LIPITOR Take 20 mg by mouth daily.    Belsomra 15 MG Tabs Generic drug: Suvorexant Take 1 tablet by mouth at bedtime.    benztropine 0.5 MG tablet Commonly known as: COGENTIN Take 0.5 mg by mouth 2 (two) times daily.    carvedilol 3.125 MG tablet Commonly known as: COREG Take 3.125 mg by mouth 2 (two) times daily.    dextromethorphan-guaiFENesin 10-100 MG/5ML liquid Commonly known as: ROBITUSSIN-DM Take by mouth every 4 (four) hours as needed for cough.    divalproex 125 MG DR tablet Commonly known as: DEPAKOTE Take 250 mg by mouth 2 (two) times daily.    docusate sodium 100 MG capsule Commonly known as: COLACE Take 100 mg by mouth 2 (two) times daily as needed for mild constipation.    donepezil 10 MG tablet Commonly known as: ARICEPT Take 1 tablet by mouth at bedtime.    fluPHENAZine decanoate 25 MG/ML injection Commonly known as: PROLIXIN Inject 31.25 mg into the muscle every 14 (fourteen) days.    fluticasone 50 MCG/ACT nasal spray Commonly known as: FLONASE Place 2 sprays into both nostrils daily.    furosemide 20 MG tablet Commonly known as: Lasix Take 1 tablet (20 mg total) by mouth daily as needed for fluid or edema.    gabapentin 100 MG capsule Commonly known as: NEURONTIN Take 200 mg by mouth 3 (three) times daily.    ibuprofen 600 MG tablet Commonly known as: ADVIL Take 600 mg by mouth every 6 (six) hours as needed.    Jardiance 25 MG Tabs tablet Generic drug: empagliflozin Take 25 mg by mouth daily.    ketoconazole 2 % cream Commonly known as: NIZORAL Apply 1 application topically in the morning and at bedtime.    levothyroxine 25 MCG tablet Commonly known as: SYNTHROID Take 1 tablet (25 mcg total) by mouth daily before breakfast.    metFORMIN 500 MG 24 hr tablet Commonly known as: GLUCOPHAGE-XR Take 500 mg by mouth daily with breakfast.    nystatin cream Commonly known as:  MYCOSTATIN Apply 1 application topically 2 (two) times daily.    omeprazole 20 MG capsule Commonly known as: PRILOSEC Take 20 mg by mouth 2 (two) times daily.    polyethylene glycol 17 g packet Commonly known as: MIRALAX / GLYCOLAX Take 17 g by mouth daily.    QUEtiapine 200 MG tablet Commonly known as: SEROQUEL Take 600 mg by mouth at bedtime.    QUEtiapine 100 MG tablet Commonly known as: SEROQUEL Take 100 mg by mouth daily. At noon    RA No Flush Niacin 500 MG tablet Generic drug: niacin Take 1,000 mg by mouth 2 (two) times daily.    senna 8.6 MG tablet Commonly known as: SENOKOT Take 1 tablet by mouth daily.    sertraline 100 MG tablet Commonly known as: ZOLOFT Take 100 mg  by mouth in the morning.    zinc sulfate 220 (50 Zn) MG capsule Take 220 mg by mouth daily.       Relevant Imaging Results:  Relevant Lab Results:   Additional Information SS #: [id number]  [place], LCSW

## 2022-03-11 NOTE — Plan of Care (Signed)
  Problem: Fluid Volume: Goal: Hemodynamic stability will improve Outcome: Adequate for Discharge   Problem: Clinical Measurements: Goal: Diagnostic test results will improve Outcome: Adequate for Discharge Goal: Signs and symptoms of infection will decrease Outcome: Adequate for Discharge   Problem: Respiratory: Goal: Ability to maintain adequate ventilation will improve Outcome: Adequate for Discharge   Problem: Education: Goal: Ability to describe self-care measures that may prevent or decrease complications (Diabetes Survival Skills Education) will improve Outcome: Adequate for Discharge Goal: Individualized Educational Video(s) Outcome: Adequate for Discharge   Problem: Coping: Goal: Ability to adjust to condition or change in health will improve Outcome: Adequate for Discharge   Problem: Fluid Volume: Goal: Ability to maintain a balanced intake and output will improve Outcome: Adequate for Discharge   Problem: Health Behavior/Discharge Planning: Goal: Ability to identify and utilize available resources and services will improve Outcome: Adequate for Discharge Goal: Ability to manage health-related needs will improve Outcome: Adequate for Discharge   Problem: Metabolic: Goal: Ability to maintain appropriate glucose levels will improve Outcome: Adequate for Discharge   Problem: Nutritional: Goal: Maintenance of adequate nutrition will improve Outcome: Adequate for Discharge Goal: Progress toward achieving an optimal weight will improve Outcome: Adequate for Discharge   Problem: Skin Integrity: Goal: Risk for impaired skin integrity will decrease Outcome: Adequate for Discharge   Problem: Tissue Perfusion: Goal: Adequacy of tissue perfusion will improve Outcome: Adequate for Discharge   Problem: Education: Goal: Knowledge of General Education information will improve Description: Including pain rating scale, medication(s)/side effects and non-pharmacologic  comfort measures Outcome: Adequate for Discharge   Problem: Health Behavior/Discharge Planning: Goal: Ability to manage health-related needs will improve Outcome: Adequate for Discharge   Problem: Clinical Measurements: Goal: Ability to maintain clinical measurements within normal limits will improve Outcome: Adequate for Discharge Goal: Will remain free from infection Outcome: Adequate for Discharge Goal: Diagnostic test results will improve Outcome: Adequate for Discharge Goal: Respiratory complications will improve Outcome: Adequate for Discharge Goal: Cardiovascular complication will be avoided Outcome: Adequate for Discharge   Problem: Activity: Goal: Risk for activity intolerance will decrease Outcome: Adequate for Discharge   Problem: Nutrition: Goal: Adequate nutrition will be maintained Outcome: Adequate for Discharge   Problem: Coping: Goal: Level of anxiety will decrease Outcome: Adequate for Discharge   Problem: Elimination: Goal: Will not experience complications related to bowel motility Outcome: Adequate for Discharge Goal: Will not experience complications related to urinary retention Outcome: Adequate for Discharge   Problem: Pain Managment: Goal: General experience of comfort will improve Outcome: Adequate for Discharge   Problem: Safety: Goal: Ability to remain free from injury will improve Outcome: Adequate for Discharge   Problem: Skin Integrity: Goal: Risk for impaired skin integrity will decrease Outcome: Adequate for Discharge   Problem: Acute Rehab PT Goals(only PT should resolve) Goal: Pt Will Go Supine/Side To Sit Outcome: Adequate for Discharge Goal: Pt Will Transfer Bed To Chair/Chair To Bed Outcome: Adequate for Discharge Goal: Pt Will Ambulate Outcome: Adequate for Discharge   Problem: Acute Rehab OT Goals (only OT should resolve) Goal: Pt. Will Perform Grooming Outcome: Adequate for Discharge Goal: Pt. Will Perform Lower Body  Dressing Outcome: Adequate for Discharge Goal: Pt. Will Transfer To Toilet Outcome: Adequate for Discharge

## 2022-03-11 NOTE — Discharge Instructions (Signed)
Advised to follow up with PCP in one week. Advised to take Eliquis 10 mg bid for 4 days to complete 7 days course followed by Eliquis 5 mg bid for 6-9 months

## 2022-03-13 LAB — CULTURE, BLOOD (ROUTINE X 2)
Culture: NO GROWTH
Special Requests: ADEQUATE

## 2022-03-14 LAB — CULTURE, BLOOD (ROUTINE X 2)
Culture: NO GROWTH
Special Requests: ADEQUATE

## 2022-03-16 DIAGNOSIS — I2699 Other pulmonary embolism without acute cor pulmonale: Secondary | ICD-10-CM | POA: Insufficient documentation

## 2022-03-16 DIAGNOSIS — Z86718 Personal history of other venous thrombosis and embolism: Secondary | ICD-10-CM | POA: Insufficient documentation

## 2022-03-25 ENCOUNTER — Other Ambulatory Visit: Payer: Self-pay | Admitting: Family Medicine

## 2022-03-25 DIAGNOSIS — Z1231 Encounter for screening mammogram for malignant neoplasm of breast: Secondary | ICD-10-CM

## 2022-04-06 ENCOUNTER — Inpatient Hospital Stay
Admission: EM | Admit: 2022-04-06 | Discharge: 2022-04-09 | DRG: 091 | Disposition: A | Discharge status: Home Health | Payer: Medicare Other | Attending: Internal Medicine | Admitting: Internal Medicine

## 2022-04-06 ENCOUNTER — Emergency Department: Payer: Medicare Other

## 2022-04-06 DIAGNOSIS — R400 Somnolence: Secondary | ICD-10-CM | POA: Diagnosis not present

## 2022-04-06 DIAGNOSIS — I252 Old myocardial infarction: Secondary | ICD-10-CM

## 2022-04-06 DIAGNOSIS — G4733 Obstructive sleep apnea (adult) (pediatric): Secondary | ICD-10-CM | POA: Diagnosis present

## 2022-04-06 DIAGNOSIS — R9389 Abnormal findings on diagnostic imaging of other specified body structures: Secondary | ICD-10-CM

## 2022-04-06 DIAGNOSIS — I959 Hypotension, unspecified: Secondary | ICD-10-CM

## 2022-04-06 DIAGNOSIS — N1831 Chronic kidney disease, stage 3a: Secondary | ICD-10-CM | POA: Diagnosis present

## 2022-04-06 DIAGNOSIS — I2510 Atherosclerotic heart disease of native coronary artery without angina pectoris: Secondary | ICD-10-CM | POA: Diagnosis present

## 2022-04-06 DIAGNOSIS — E1169 Type 2 diabetes mellitus with other specified complication: Secondary | ICD-10-CM | POA: Diagnosis present

## 2022-04-06 DIAGNOSIS — F32A Depression, unspecified: Secondary | ICD-10-CM | POA: Diagnosis present

## 2022-04-06 DIAGNOSIS — F200 Paranoid schizophrenia: Secondary | ICD-10-CM | POA: Diagnosis present

## 2022-04-06 DIAGNOSIS — E079 Disorder of thyroid, unspecified: Secondary | ICD-10-CM | POA: Diagnosis present

## 2022-04-06 DIAGNOSIS — Z7984 Long term (current) use of oral hypoglycemic drugs: Secondary | ICD-10-CM

## 2022-04-06 DIAGNOSIS — F79 Unspecified intellectual disabilities: Secondary | ICD-10-CM | POA: Diagnosis present

## 2022-04-06 DIAGNOSIS — Z7982 Long term (current) use of aspirin: Secondary | ICD-10-CM

## 2022-04-06 DIAGNOSIS — R531 Weakness: Secondary | ICD-10-CM | POA: Diagnosis present

## 2022-04-06 DIAGNOSIS — M899 Disorder of bone, unspecified: Secondary | ICD-10-CM | POA: Diagnosis present

## 2022-04-06 DIAGNOSIS — Z79890 Hormone replacement therapy (postmenopausal): Secondary | ICD-10-CM

## 2022-04-06 DIAGNOSIS — G928 Other toxic encephalopathy: Principal | ICD-10-CM | POA: Diagnosis present

## 2022-04-06 DIAGNOSIS — I130 Hypertensive heart and chronic kidney disease with heart failure and stage 1 through stage 4 chronic kidney disease, or unspecified chronic kidney disease: Secondary | ICD-10-CM | POA: Diagnosis present

## 2022-04-06 DIAGNOSIS — E039 Hypothyroidism, unspecified: Secondary | ICD-10-CM | POA: Diagnosis present

## 2022-04-06 DIAGNOSIS — I5022 Chronic systolic (congestive) heart failure: Secondary | ICD-10-CM | POA: Diagnosis present

## 2022-04-06 DIAGNOSIS — E1122 Type 2 diabetes mellitus with diabetic chronic kidney disease: Secondary | ICD-10-CM | POA: Diagnosis present

## 2022-04-06 DIAGNOSIS — J9601 Acute respiratory failure with hypoxia: Secondary | ICD-10-CM | POA: Diagnosis present

## 2022-04-06 DIAGNOSIS — E785 Hyperlipidemia, unspecified: Secondary | ICD-10-CM | POA: Diagnosis present

## 2022-04-06 DIAGNOSIS — I493 Ventricular premature depolarization: Secondary | ICD-10-CM | POA: Diagnosis present

## 2022-04-06 DIAGNOSIS — I5042 Chronic combined systolic (congestive) and diastolic (congestive) heart failure: Secondary | ICD-10-CM | POA: Diagnosis present

## 2022-04-06 DIAGNOSIS — Z7901 Long term (current) use of anticoagulants: Secondary | ICD-10-CM

## 2022-04-06 DIAGNOSIS — F419 Anxiety disorder, unspecified: Secondary | ICD-10-CM | POA: Diagnosis present

## 2022-04-06 DIAGNOSIS — Z86711 Personal history of pulmonary embolism: Secondary | ICD-10-CM

## 2022-04-06 DIAGNOSIS — Z1152 Encounter for screening for COVID-19: Secondary | ICD-10-CM

## 2022-04-06 DIAGNOSIS — Z7985 Long-term (current) use of injectable non-insulin antidiabetic drugs: Secondary | ICD-10-CM

## 2022-04-06 DIAGNOSIS — Z91030 Bee allergy status: Secondary | ICD-10-CM

## 2022-04-06 LAB — CBC
HCT: 37 % (ref 36.0–46.0)
Hemoglobin: 10.5 g/dL — ABNORMAL LOW (ref 12.0–15.0)
MCH: 25.3 pg — ABNORMAL LOW (ref 26.0–34.0)
MCHC: 28.4 g/dL — ABNORMAL LOW (ref 30.0–36.0)
MCV: 89.2 fL (ref 80.0–100.0)
Platelets: 213 10*3/uL (ref 150–400)
RBC: 4.15 MIL/uL (ref 3.87–5.11)
RDW: 18 % — ABNORMAL HIGH (ref 11.5–15.5)
WBC: 5.7 10*3/uL (ref 4.0–10.5)
nRBC: 0 % (ref 0.0–0.2)

## 2022-04-06 LAB — BASIC METABOLIC PANEL
Anion gap: 9 (ref 5–15)
BUN: 25 mg/dL — ABNORMAL HIGH (ref 8–23)
CO2: 23 mmol/L (ref 22–32)
Calcium: 8.9 mg/dL (ref 8.9–10.3)
Chloride: 104 mmol/L (ref 98–111)
Creatinine, Ser: 1.47 mg/dL — ABNORMAL HIGH (ref 0.44–1.00)
GFR, Estimated: 40 mL/min — ABNORMAL LOW (ref >60)
Glucose, Bld: 251 mg/dL — ABNORMAL HIGH (ref 70–99)
Potassium: 4 mmol/L (ref 3.5–5.1)
Sodium: 136 mmol/L (ref 135–145)

## 2022-04-06 LAB — URINALYSIS, ROUTINE W REFLEX MICROSCOPIC
Bacteria, UA: NONE SEEN
Bilirubin Urine: NEGATIVE
Glucose, UA: >=500 mg/dL — AB
Hgb urine dipstick: NEGATIVE
Ketones, ur: NEGATIVE mg/dL
Leukocytes,Ua: NEGATIVE
Nitrite: NEGATIVE
Protein, ur: NEGATIVE mg/dL
Specific Gravity, Urine: 1.013 (ref 1.005–1.030)
Squamous Epithelial / HPF: NONE SEEN /HPF (ref 0–5)
WBC, UA: NONE SEEN WBC/hpf (ref 0–5)
pH: 6 (ref 5.0–8.0)

## 2022-04-06 LAB — TROPONIN I (HIGH SENSITIVITY): Troponin I (High Sensitivity): 15 ng/L (ref <18)

## 2022-04-06 LAB — VALPROIC ACID LEVEL: Valproic Acid Lvl: 19 ug/mL — ABNORMAL LOW (ref 50.0–100.0)

## 2022-04-06 LAB — BLOOD GAS, VENOUS
Acid-Base Excess: 0.8 mmol/L (ref 0.0–2.0)
Bicarbonate: 28.5 mmol/L — ABNORMAL HIGH (ref 20.0–28.0)
O2 Saturation: 50.9 %
Patient temperature: 37
pCO2, Ven: 58 mmHg (ref 44–60)
pH, Ven: 7.3 (ref 7.25–7.43)
pO2, Ven: 33 mmHg (ref 32–45)

## 2022-04-06 LAB — AMMONIA: Ammonia: 11 umol/L (ref 9–35)

## 2022-04-06 LAB — RESP PANEL BY RT-PCR (FLU A&B, COVID) ARPGX2
Influenza A by PCR: NEGATIVE
Influenza B by PCR: NEGATIVE
SARS Coronavirus 2 by RT PCR: NEGATIVE

## 2022-04-06 LAB — BRAIN NATRIURETIC PEPTIDE: B Natriuretic Peptide: 81.5 pg/mL (ref 0.0–100.0)

## 2022-04-06 LAB — LACTIC ACID, PLASMA: Lactic Acid, Venous: 0.9 mmol/L (ref 0.5–1.9)

## 2022-04-06 MED ORDER — QUETIAPINE FUMARATE 25 MG PO TABS
100.0000 mg | ORAL_TABLET | Freq: Every day | ORAL | Status: DC
  Administered 2022-04-07 – 2022-04-09 (×3): 100 mg via ORAL
  Filled 2022-04-06 (×3): qty 4

## 2022-04-06 MED ORDER — STROKE: EARLY STAGES OF RECOVERY BOOK: Freq: Once | Status: DC

## 2022-04-06 MED ORDER — QUETIAPINE FUMARATE 300 MG PO TABS
600.0000 mg | ORAL_TABLET | Freq: Every day | ORAL | Status: DC
  Administered 2022-04-07 – 2022-04-08 (×3): 600 mg via ORAL
  Filled 2022-04-06 (×3): qty 2

## 2022-04-06 MED ORDER — DIVALPROEX SODIUM 250 MG PO DR TAB
250.0000 mg | DELAYED_RELEASE_TABLET | Freq: Two times a day (BID) | ORAL | Status: DC
  Administered 2022-04-07 – 2022-04-09 (×6): 250 mg via ORAL
  Filled 2022-04-06 (×6): qty 1

## 2022-04-06 MED ORDER — ATORVASTATIN CALCIUM 20 MG PO TABS
20.0000 mg | ORAL_TABLET | Freq: Every day | ORAL | Status: DC
  Administered 2022-04-07 – 2022-04-09 (×3): 20 mg via ORAL
  Filled 2022-04-06 (×3): qty 1

## 2022-04-06 MED ORDER — SODIUM CHLORIDE 0.9 % IV BOLUS
500.0000 mL | Freq: Once | INTRAVENOUS | Status: AC
  Administered 2022-04-06: 500 mL via INTRAVENOUS

## 2022-04-06 MED ORDER — LEVOTHYROXINE SODIUM 25 MCG PO TABS
25.0000 ug | ORAL_TABLET | Freq: Every day | ORAL | Status: DC
  Administered 2022-04-07 – 2022-04-09 (×3): 25 ug via ORAL
  Filled 2022-04-06 (×3): qty 1

## 2022-04-06 MED ORDER — IOHEXOL 350 MG/ML SOLN
60.0000 mL | Freq: Once | INTRAVENOUS | Status: AC | PRN
  Administered 2022-04-06: 60 mL via INTRAVENOUS

## 2022-04-06 MED ORDER — APIXABAN 5 MG PO TABS
5.0000 mg | ORAL_TABLET | Freq: Two times a day (BID) | ORAL | Status: DC
  Administered 2022-04-07 – 2022-04-09 (×6): 5 mg via ORAL
  Filled 2022-04-06 (×6): qty 1

## 2022-04-06 NOTE — ED Notes (Signed)
ED Provider at bedside. 

## 2022-04-06 NOTE — ED Triage Notes (Addendum)
Pt to ED via [facility] from a [facility]. Staff concerned pt has been sleeping a lot. Unsure of past medical hx. 2 wks ago pt was treated for UTI. Staff member is suppose to be coming to stay with pt. Abdomen appears distended. CBG 360. RBBB showing on EKG. Pt A&Ox2.  Gwyndolyn Saxon is contact at facility [phone]

## 2022-04-06 NOTE — ED Notes (Signed)
Patient transported back from CT

## 2022-04-06 NOTE — ED Notes (Signed)
Pt brought to ED rm 5 at this time, this RN now assuming care.

## 2022-04-06 NOTE — ED Notes (Signed)
Patient transported to CT 

## 2022-04-06 NOTE — H&P (Signed)
History and Physical    Chief Complaint: Somnolence   HISTORY OF PRESENT ILLNESS: Elizabeth Gutierrez is an 62 y.o. female past medical history of hypertension (heart failure pulmonary embolism diabetes mellitus type 2 from nursing home excessive sleepiness and somnolence. On initial arrival patient was noted to be mildly hypotensive and given a fluid bolus challenge change patient responded well with blood pressure improving.  O2 sats were also 89% which improved to 98% on 2 L nasal cannula. HPI is limited and as per chart review and per ED MD note and nursing note as patient is very sleepy.  Cooperative follows commands oriented but still very somnolent.  Initial lactic acid is within normal limits. Pt is arousable and follows commands. Per caretaker at bedside pt is livign in a group home and has been sleepy. No reports of any speech issue or falls.    Pt has  Past Medical History:  Diagnosis Date   CHF (congestive heart failure) ([place])    Coronary artery disease    Diabetes ([place])    Heart attack ([place])    Hyperlipidemia    Hypertension    MR (mental retardation)    Mild   Paranoid schizophrenia ([place])    Thyroid disease      Review of Systems  Unable to perform ROS: Mental status change (Somnolence)   Allergies  Allergen Reactions   Bee Venom    Past Surgical History:  Procedure Laterality Date   FOOT SURGERY Right        MEDICATIONS: Current Outpatient Medications  Medication Instructions   acetaminophen (TYLENOL) 650 mg, Oral, Every 4 hours PRN   apixaban (ELIQUIS) 5 MG TABS tablet Take Eliquis 10 mg bid for 4 days followed by 5 mg bid   ascorbic acid (VITAMIN C) 1000 MG tablet 1 tablet, Oral, Daily   aspirin 81 mg, Daily   atorvastatin (LIPITOR) 20 mg, Oral, Daily   BELSOMRA 15 MG TABS 1 tablet, Oral, Daily at bedtime   benztropine (COGENTIN) 0.5 mg, 2 times daily   carvedilol (COREG) 3.125 mg, Oral, 2 times daily   divalproex (DEPAKOTE) 250 mg, Oral, 2 times  daily   docusate sodium (COLACE) 100 mg, 2 times daily PRN   donepezil (ARICEPT) 10 MG tablet 1 tablet, Oral, Daily at bedtime   fluPHENAZine decanoate (PROLIXIN) 31.25 mg, Intramuscular, Every 14 days   fluticasone (FLONASE) 50 MCG/ACT nasal spray 2 sprays, Each Nare, Daily   furosemide (LASIX) 20 mg, Oral, Daily PRN   gabapentin (NEURONTIN) 200 mg, Oral, 3 times daily   ibuprofen (ADVIL) 600 mg, Oral, Every 6 hours PRN   Jardiance 25 mg, Oral, Daily   ketoconazole (NIZORAL) 2 % cream 1 application , Topical, 2 times daily   levothyroxine (SYNTHROID) 25 mcg, Oral, Daily before breakfast   metFORMIN (GLUCOPHAGE-XR) 500 mg, Oral, Daily with breakfast   nystatin cream (MYCOSTATIN) 1 application , Topical, 2 times daily   omeprazole (PRILOSEC) 20 mg, Oral, 2 times daily   polyethylene glycol (MIRALAX / GLYCOLAX) 17 g, Oral, Daily   QUEtiapine (SEROQUEL) 100 mg, Oral, Daily, At noon   QUEtiapine (SEROQUEL) 600 mg, Oral, Daily at bedtime   RA No Flush Niacin 1,000 mg, Oral, 2 times daily   senna (SENOKOT) 8.6 MG tablet 1 tablet, Oral, Daily   sertraline (ZOLOFT) 100 mg, Oral, Every morning   Trulicity 8.14 mg, Subcutaneous, Weekly   zinc sulfate 220 mg, Oral, Daily      ED Course: Pt in Ed in  the emergency room patient is sleepy arousable cooperative.  Vitals:   04/06/22 2145 04/06/22 2215 04/06/22 2230 04/06/22 2257  BP: 115/66 (!) 102/53 (!) 92/50 101/68  Pulse: 71 68 66   Resp: '17 14 13   '$ Temp:      SpO2: 100% 97% 98%     Total I/O In: 500 [IV Piggyback:500] Out: 700 [Urine:700] SpO2: 98 % O2 Flow Rate (L/min): 2 L/min Blood work in ed shows: ABG is within normal limits no hypercapnia noted. CMP shows glucose of 251 CKD that is chronic LFTs in December 2023 were within normal limits. CBC shows hemoglobin of 10.5 and anemia and in her case is chronic. Urinalysis is within normal limits head CT was within normal limits. CT of the chest PE protocol was negative for PE but  findings concerning for multiple myeloma. Patient will need outpatient hematology evaluation for along with nephrology and will defer that to primary care provider. Initial troponin of 15. Urine and blood cultures have been ordered. EKG today is similar to her December EKG shows: EKG shows sinus rhythm at 80 with PVCs, biatrial enlargement, right bundle branch block QRS of 160, QTc at 502, T wave inversions in V1 and V2. -       Results for orders placed or performed during the hospital encounter of 04/06/22 (from the past 24 hour(s))  Basic metabolic panel     Status: Abnormal   Collection Time: 04/06/22  5:56 PM  Result Value Ref Range   Sodium 136 135 - 145 mmol/L   Potassium 4.0 3.5 - 5.1 mmol/L   Chloride 104 98 - 111 mmol/L   CO2 23 22 - 32 mmol/L   Glucose, Bld 251 (H) 70 - 99 mg/dL   BUN 25 (H) 8 - 23 mg/dL   Creatinine, Ser 1.47 (H) 0.44 - 1.00 mg/dL   Calcium 8.9 8.9 - 10.3 mg/dL   GFR, Estimated 40 (L) >60 mL/min   Anion gap 9 5 - 15  CBC     Status: Abnormal   Collection Time: 04/06/22  5:56 PM  Result Value Ref Range   WBC 5.7 4.0 - 10.5 K/uL   RBC 4.15 3.87 - 5.11 MIL/uL   Hemoglobin 10.5 (L) 12.0 - 15.0 g/dL   HCT 37.0 36.0 - 46.0 %   MCV 89.2 80.0 - 100.0 fL   MCH 25.3 (L) 26.0 - 34.0 pg   MCHC 28.4 (L) 30.0 - 36.0 g/dL   RDW 18.0 (H) 11.5 - 15.5 %   Platelets 213 150 - 400 K/uL   nRBC 0.0 0.0 - 0.2 %  Urinalysis, Routine w reflex microscopic Anterior Nasal Swab     Status: Abnormal   Collection Time: 04/06/22  9:20 PM  Result Value Ref Range   Color, Urine STRAW (A) YELLOW   APPearance CLEAR (A) CLEAR   Specific Gravity, Urine 1.013 1.005 - 1.030   pH 6.0 5.0 - 8.0   Glucose, UA >=500 (A) NEGATIVE mg/dL   Hgb urine dipstick NEGATIVE NEGATIVE   Bilirubin Urine NEGATIVE NEGATIVE   Ketones, ur NEGATIVE NEGATIVE mg/dL   Protein, ur NEGATIVE NEGATIVE mg/dL   Nitrite NEGATIVE NEGATIVE   Leukocytes,Ua NEGATIVE NEGATIVE   WBC, UA NONE SEEN 0 - 5 WBC/hpf    Bacteria, UA NONE SEEN NONE SEEN   Squamous Epithelial / HPF NONE SEEN 0 - 5 /HPF  Lactic acid, plasma     Status: None   Collection Time: 04/06/22  9:20 PM  Result Value  Ref Range   Lactic Acid, Venous 0.9 0.5 - 1.9 mmol/L  Blood gas, venous     Status: Abnormal   Collection Time: 04/06/22  9:20 PM  Result Value Ref Range   pH, Ven 7.3 7.25 - 7.43   pCO2, Ven 58 44 - 60 mmHg   pO2, Ven 33 32 - 45 mmHg   Bicarbonate 28.5 (H) 20.0 - 28.0 mmol/L   Acid-Base Excess 0.8 0.0 - 2.0 mmol/L   O2 Saturation 50.9 %   Patient temperature 37.0    Collection site VEIN   Ammonia     Status: None   Collection Time: 04/06/22  9:20 PM  Result Value Ref Range   Ammonia 11 9 - 35 umol/L  Resp Panel by RT-PCR (Flu A&B, Covid) Anterior Nasal Swab     Status: None   Collection Time: 04/06/22  9:20 PM   Specimen: Anterior Nasal Swab  Result Value Ref Range   SARS Coronavirus 2 by RT PCR NEGATIVE NEGATIVE   Influenza A by PCR NEGATIVE NEGATIVE   Influenza B by PCR NEGATIVE NEGATIVE  Troponin I (High Sensitivity)     Status: None   Collection Time: 04/06/22  9:20 PM  Result Value Ref Range   Troponin I (High Sensitivity) 15 <18 ng/L    Unresulted Labs (From admission, onward)     Start     Ordered   04/06/22 2255  Brain natriuretic peptide  Add-on,   AD        04/06/22 2254   04/06/22 2253  Valproic acid level  Add-on,   AD        04/06/22 2252   04/06/22 2039  Urine Culture  Once,   URGENT       Question:  Indication  Answer:  Altered mental status (if no other cause identified)   04/06/22 2038   04/06/22 2038  Blood culture (single)  Once,   STAT        04/06/22 2038           Pt has received : Orders Placed This Encounter  Procedures   Blood culture (single)    Standing Status:   Standing    Number of Occurrences:   1   Resp Panel by RT-PCR (Flu A&B, Covid) Anterior Nasal Swab    Standing Status:   Standing    Number of Occurrences:   1   Urine Culture    Standing Status:    Standing    Number of Occurrences:   1    Order Specific Question:   Indication    Answer:   Altered mental status (if no other cause identified)   CT Angio Chest PE W and/or Wo Contrast    Standing Status:   Standing    Number of Occurrences:   1    Order Specific Question:   Does the patient have a contrast media/X-ray dye allergy?    Answer:   No    Order Specific Question:   If indicated for the ordered procedure, I authorize the administration of contrast media per Radiology protocol    Answer:   Yes    Order Specific Question:   Radiology Contrast Protocol - do NOT remove file path    Answer:   [email or web address]   CT Head Wo Contrast    Standing Status:   Standing    Number of Occurrences:   1   Basic metabolic panel    Standing Status:   Standing  Number of Occurrences:   1   CBC    Standing Status:   Standing    Number of Occurrences:   1   Urinalysis, Routine w reflex microscopic    Standing Status:   Standing    Number of Occurrences:   1   Lactic acid, plasma    Standing Status:   Standing    Number of Occurrences:   1   Blood gas, venous    Standing Status:   Standing    Number of Occurrences:   1   Ammonia    Standing Status:   Standing    Number of Occurrences:   1   Valproic acid level    Standing Status:   Standing    Number of Occurrences:   1   Brain natriuretic peptide    Standing Status:   Standing    Number of Occurrences:   1   Document Height and Actual Weight    Use scales to weigh patient, not stated or estimated weight.    Standing Status:   Standing    Number of Occurrences:   1   In and Out Cath    Standing Status:   Standing    Number of Occurrences:   1   Consult to hospitalist    Standing Status:   Standing    Number of Occurrences:   1    Order Specific Question:   Place call to:    Answer:   hospitalist    Order Specific Question:   Reason for Consult    Answer:   Admit    Order Specific  Question:   Diagnosis/Clinical Info for Consult:    Answer:   altered sensorium, oxygen requirement   CBG monitoring, ED    Standing Status:   Standing    Number of Occurrences:   1   ED EKG    Altered mental status    Standing Status:   Standing    Number of Occurrences:   1    Order Specific Question:   Reason for Exam    Answer:   Other (See Comments)    Meds ordered this encounter  Medications   sodium chloride 0.9 % bolus 500 mL   iohexol (OMNIPAQUE) 350 MG/ML injection 60 mL     Admission Imaging : CT Angio Chest PE W and/or Wo Contrast  Result Date: 04/06/2022 CLINICAL DATA:  Pulmonary embolus suspected EXAM: CT ANGIOGRAPHY CHEST WITH CONTRAST TECHNIQUE: Multidetector CT imaging of the chest was performed using the standard protocol during bolus administration of intravenous contrast. Multiplanar CT image reconstructions and MIPs were obtained to evaluate the vascular anatomy. RADIATION DOSE REDUCTION: This exam was performed according to the departmental dose-optimization program which includes automated exposure control, adjustment of the mA and/or kV according to patient size and/or use of iterative reconstruction technique. CONTRAST:  48m OMNIPAQUE IOHEXOL 350 MG/ML SOLN COMPARISON:  Chest CT dated September 26, 2019 FINDINGS: Cardiovascular: No pulmonary embolus. Cardiomegaly. Trace pericardial effusion. Normal caliber thoracic aorta with mild atherosclerotic disease. Dilated main pulmonary artery, measuring up to 3.2 cm. Mediastinum/Nodes: Small hiatal hernia and patulous esophagus. Heterogeneous enlarged thyroid. No pathologically enlarged lymph nodes seen in the chest. Lungs/Pleura: Central airways are patent. Bibasilar atelectasis. Mild scattered linear opacities which are likely due to scarring. No consolidation, pleural effusion or pneumothorax. Upper Abdomen: Gallstones.  No acute abnormality. Musculoskeletal: Numerous tiny scattered lucent lesions are seen throughout the  visualized skeletal structures. Review of the  MIP images confirms the above findings. IMPRESSION: 1. No evidence of pulmonary embolus. 2. Cardiomegaly with trace pericardial effusion. 3. Dilated main pulmonary artery, can be seen in the setting of pulmonary hypertension. 4. Numerous tiny scattered lucent lesions are seen throughout the visualized skeletal structures, findings are concerning for multiple myeloma. Correlate with laboratory analysis. 5. Aortic Atherosclerosis (ICD10-I70.0). Electronically Signed   By: Yetta Glassman M.D.   On: 04/06/2022 21:56   CT Head Wo Contrast  Result Date: 04/06/2022 CLINICAL DATA:  Delirium EXAM: CT HEAD WITHOUT CONTRAST TECHNIQUE: Contiguous axial images were obtained from the base of the skull through the vertex without intravenous contrast. RADIATION DOSE REDUCTION: This exam was performed according to the departmental dose-optimization program which includes automated exposure control, adjustment of the mA and/or kV according to patient size and/or use of iterative reconstruction technique. COMPARISON:  CT head 03/07/2022 FINDINGS: Brain: No evidence of large-territorial acute infarction. No parenchymal hemorrhage. No mass lesion. No extra-axial collection. No mass effect or midline shift. No hydrocephalus. Basilar cisterns are patent. Vascular: No hyperdense vessel. Skull: No acute fracture or focal lesion. Sinuses/Orbits: Paranasal sinuses and mastoid air cells are clear. The orbits are unremarkable. Other: None. IMPRESSION: No acute intracranial abnormality. Electronically Signed   By: Iven Finn M.D.   On: 04/06/2022 21:47    Physical Examination: Vitals:   04/06/22 2145 04/06/22 2215 04/06/22 2230 04/06/22 2257  BP: 115/66 (!) 102/53 (!) 92/50 101/68  Pulse: 71 68 66   Temp:      Resp: '17 14 13   '$ SpO2: 100% 97% 98%    Physical Exam Constitutional:      Appearance: She is obese.  HENT:     Head: Normocephalic.     Mouth/Throat:     Mouth:  Mucous membranes are moist.  Eyes:     Extraocular Movements: Extraocular movements intact.  Cardiovascular:     Rate and Rhythm: Normal rate and regular rhythm.     Pulses: Normal pulses.     Heart sounds: Normal heart sounds.  Pulmonary:     Effort: Pulmonary effort is normal.     Breath sounds: Normal breath sounds.  Abdominal:     General: Bowel sounds are normal.     Palpations: Abdomen is soft.  Musculoskeletal:     Right lower leg: No edema.     Left lower leg: No edema.  Skin:    Findings: No lesion.  Neurological:     General: No focal deficit present.     Mental Status: She is lethargic.       Assessment and Plan: * Somnolence .  Type 2 diabetes mellitus with hyperlipidemia ([place]) .  Essential hypertension .  Anxiety and depression .  Chronic kidney disease, stage 3a ([place]) .  Hypothyroidism .  Paranoid schizophrenia ([place]) .  Obstructive apnea .   CAD in native artery .  Chronic combined systolic (congestive) and diastolic (congestive) heart failure ([place]) .    >> Somnolence: Differentials include metabolic encephalopathy, secondary to hypoperfusion, TIA, CVA, hyper or hypoglycemia. Will admit patient obtain an EEG and an MRI of the brain to identify any seizure activity and a lower possibility of a stroke as patient has a history of stroke. N.p.o., aspiration, seizures, fall precautions.  Review home meds and optimize regimen to avoid polypharmacy and medications contributing to the symptoms.  >> Diabetes mellitus type 2: Currently patient will be n.p.o. will have Accu-Cheks every 2 hours and glycemic coverage as deemed appropriate.   >>  Essential hypertension: Patient on arrival was hypotensive. Again attributed to polypharmacy will modify patient's med list to further optimize blood pressure control. Currently however due to patient's initial hypotensive presentation we will hold all home meds as far as blood pressure control and other  meds that may be causing hypotension.  >> Anxiety and depression/paranoid schizophrenia: Resume home medications as deemed appropriate once med reconciliation is available. Patient on Prolixin, Zoloft, Seroquel, Aricept.   >> CKD stage IIIa: Avoid contrast studies, renally dose all needed medications.   >> Hypothyroidism: Will continue patient on her replacement therapy once med rec is available.  Patient is currently on levothyroxine 25 mcg.   >> Obstructive sleep apnea: CPAP per home settings.    >> CAD/chronic combined systolic and diastolic heart failure. Will continue patient on aspirin, Eliquis, atorvastatin, Coreg once blood pressure improves. Will hold patient's Advil or discontinue it discontinue Jardiance that may be potentiating orthostatic changes and is causing hypotension. And hypotension causing hypoperfusion causing lethargy excessive sleepiness. EKG shows PVCs and prolonged QT interval, will defer to a.m. team to obtain cardiology consult, pt would benefit from monitoring for any dysrhythmia . Most recent echocardiogram in 2021 shows a EF of 30-35% 1. Left ventricular ejection fraction, by estimation, is 30 to 35%. The  left ventricle has moderately decreased function. The left ventricle  demonstrates severe global hypokinesis, wall motion of basal regions best  perserved, unable to exclude stress  cardiomyopathy. Left ventricular diastolic parameters are consistent with  Grade I diastolic dysfunction (impaired relaxation).   2. Right ventricular systolic function is normal. The right ventricular  size is normal.    DVT prophylaxis:  SCDs Code Status:  Full code Family Communication:   Albertsen,Joyce (Mother) [phone] (Mobile) Disposition Plan:  [place] called:  None Admission status: Inpatient Unit/ Expected LOS: Telemetry unit   Para Skeans MD [facility]  6 PM- 2 AM. Please contact me via secure Chat 6 PM-2  AM. [phone]( Pager ) To contact the [facility] Attending or Consulting provider [place] or covering provider during after hours [place], for this patient.   Check the care team in [facility] and look for a) attending/consulting [facility] provider listed and b) the [facility] team listed Log into [email or web address] and use [place]'s universal password to access. If you do not have the password, please contact the hospital operator. Locate the [facility] provider you are looking for under [facility] and page to a number that you can be directly reached. If you still have difficulty reaching the provider, please page the [facility] (Director on Call) for the Hospitalists listed on amion for assistance. [email or web address] 04/06/2022, 11:14 PM

## 2022-04-06 NOTE — ED Notes (Signed)
Elizabeth Gutierrez, [phone]

## 2022-04-06 NOTE — Assessment & Plan Note (Addendum)
Glycemic protocol.  Hold Jardiance and metformin.

## 2022-04-06 NOTE — ED Provider Notes (Signed)
[facility] Provider Note    Event Date/Time   First MD Initiated Contact with Patient 04/06/22 2025     (approximate)   History   Altered Mental Status  EM caveat: Somnolence, slight confusion  HPI  Elizabeth Gutierrez is a 62 y.o. female  PMH  Chronic systolic CHF, coronary artery disease, type 2 diabetes mellitus, hypertension, dyslipidemia, paranoid schizophrenia and hypothyroidism   Reviewed discharge summary from December 22.  Patient was treated for a UTI but then later found to have hypoxia and pulmonary embolism started on Eliquis.  Ental status improved with treatment of the urinary tract infection.   Patient reports that she does not have any concerns right now.  Of note that the patient is somnolent, tends to drift off to sleep when not stimulated.  But she is alert recognizes her group home worker by name give her own name and information when no alert but quickly drowsy back to sleep   Group home worker with her reports that today during her day program they reported she was fatigued and not herself seem tired.  Same was noticed this evening at the group home and also noted her oxygen level was 88% which is unusual for her.  Review of her medication reconciliation shows she is compliant with her medications including her current oral anticoagulant  Patient denies pain no headaches no chest pains no falls or injuries to noted.  Group home concerned that she might have symptoms of another urinary tract infection as this was very similar to when she had in December  Physical Exam   Triage Vital Signs: ED Triage Vitals  Enc Vitals Group     BP 04/06/22 1756 113/67     Pulse Rate 04/06/22 1753 80     Resp 04/06/22 1753 18     Temp 04/06/22 1753 98.1 F (36.7 C)     Temp src --      SpO2 04/06/22 1753 95 %     Weight --      Height --      Head Circumference --      Peak Flow --      Pain Score --      Pain Loc --      Pain Edu? --      Excl.  in [place]? --     Most recent vital signs: Vitals:   04/06/22 2230 04/06/22 2257  BP: (!) 92/50 101/68  Pulse: 66   Resp: 13   Temp:    SpO2: 98%      General: Awake, no distress.  Somnolent though and when not spoken to she tends to fall asleep quite quickly. CV:  Good peripheral perfusion.  Normal tones and rate Resp:  Normal effort.  With assistance able to sit up takes good deep breaths no wheezing no rales no crackles.  Lung sounds are clear.  Shows no respiratory distress. Abd:  No distention.  Soft nontender nondistended.  Reports she did have some constipation earlier but they gave her MiraLAX she had a bowel movement and that has resolved.  Has no pain to palpation in any area or quadrant Other:  Warm well-perfused extremities   ED Results / Procedures / Treatments   Labs (all labs ordered are listed, but only abnormal results are displayed) Labs Reviewed  BASIC METABOLIC PANEL - Abnormal; Notable for the following components:      Result Value   Glucose, Bld 251 (*)  BUN 25 (*)    Creatinine, Ser 1.47 (*)    GFR, Estimated 40 (*)    All other components within normal limits  CBC - Abnormal; Notable for the following components:   Hemoglobin 10.5 (*)    MCH 25.3 (*)    MCHC 28.4 (*)    RDW 18.0 (*)    All other components within normal limits  URINALYSIS, ROUTINE W REFLEX MICROSCOPIC - Abnormal; Notable for the following components:   Color, Urine STRAW (*)    APPearance CLEAR (*)    Glucose, UA >=500 (*)    All other components within normal limits  BLOOD GAS, VENOUS - Abnormal; Notable for the following components:   Bicarbonate 28.5 (*)    All other components within normal limits  VALPROIC ACID LEVEL - Abnormal; Notable for the following components:   Valproic Acid Lvl 19 (*)    All other components within normal limits  RESP PANEL BY RT-PCR (FLU A&B, COVID) ARPGX2  CULTURE, BLOOD (SINGLE)  URINE CULTURE  LACTIC ACID, PLASMA  AMMONIA  BRAIN  NATRIURETIC PEPTIDE  LIPID PANEL  CBG MONITORING, ED  TROPONIN I (HIGH SENSITIVITY)     EKG  And interpreted by me at 1805 heart rate 80 QRS 160 QTc 500 Right bundle branch block with pre-existing intraventricular conduction delay.  Compared to previous EKG from December 21 morphology appears similar without acute change.  No STEMI   RADIOLOGY CT head interpreted by me as negative for acute gross intracranial pathology such as hemorrhage  CT Angio Chest PE W and/or Wo Contrast  Result Date: 04/06/2022 CLINICAL DATA:  Pulmonary embolus suspected EXAM: CT ANGIOGRAPHY CHEST WITH CONTRAST TECHNIQUE: Multidetector CT imaging of the chest was performed using the standard protocol during bolus administration of intravenous contrast. Multiplanar CT image reconstructions and MIPs were obtained to evaluate the vascular anatomy. RADIATION DOSE REDUCTION: This exam was performed according to the departmental dose-optimization program which includes automated exposure control, adjustment of the mA and/or kV according to patient size and/or use of iterative reconstruction technique. CONTRAST:  47m OMNIPAQUE IOHEXOL 350 MG/ML SOLN COMPARISON:  Chest CT dated September 26, 2019 FINDINGS: Cardiovascular: No pulmonary embolus. Cardiomegaly. Trace pericardial effusion. Normal caliber thoracic aorta with mild atherosclerotic disease. Dilated main pulmonary artery, measuring up to 3.2 cm. Mediastinum/Nodes: Small hiatal hernia and patulous esophagus. Heterogeneous enlarged thyroid. No pathologically enlarged lymph nodes seen in the chest. Lungs/Pleura: Central airways are patent. Bibasilar atelectasis. Mild scattered linear opacities which are likely due to scarring. No consolidation, pleural effusion or pneumothorax. Upper Abdomen: Gallstones.  No acute abnormality. Musculoskeletal: Numerous tiny scattered lucent lesions are seen throughout the visualized skeletal structures. Review of the MIP images confirms the above  findings. IMPRESSION: 1. No evidence of pulmonary embolus. 2. Cardiomegaly with trace pericardial effusion. 3. Dilated main pulmonary artery, can be seen in the setting of pulmonary hypertension. 4. Numerous tiny scattered lucent lesions are seen throughout the visualized skeletal structures, findings are concerning for multiple myeloma. Correlate with laboratory analysis. 5. Aortic Atherosclerosis (ICD10-I70.0). Electronically Signed   By: LYetta GlassmanM.D.   On: 04/06/2022 21:56   CT Head Wo Contrast  Result Date: 04/06/2022 CLINICAL DATA:  Delirium EXAM: CT HEAD WITHOUT CONTRAST TECHNIQUE: Contiguous axial images were obtained from the base of the skull through the vertex without intravenous contrast. RADIATION DOSE REDUCTION: This exam was performed according to the departmental dose-optimization program which includes automated exposure control, adjustment of the mA and/or kV according to patient size  and/or use of iterative reconstruction technique. COMPARISON:  CT head 03/07/2022 FINDINGS: Brain: No evidence of large-territorial acute infarction. No parenchymal hemorrhage. No mass lesion. No extra-axial collection. No mass effect or midline shift. No hydrocephalus. Basilar cisterns are patent. Vascular: No hyperdense vessel. Skull: No acute fracture or focal lesion. Sinuses/Orbits: Paranasal sinuses and mastoid air cells are clear. The orbits are unremarkable. Other: None. IMPRESSION: No acute intracranial abnormality. Electronically Signed   By: Iven Finn M.D.   On: 04/06/2022 21:47       PROCEDURES:  Critical Care performed: No  Procedures   MEDICATIONS ORDERED IN ED: Medications  apixaban (ELIQUIS) tablet 5 mg (has no administration in time range)  atorvastatin (LIPITOR) tablet 20 mg (has no administration in time range)  divalproex (DEPAKOTE) DR tablet 250 mg (has no administration in time range)  levothyroxine (SYNTHROID) tablet 25 mcg (has no administration in time range)   QUEtiapine (SEROQUEL) tablet 100 mg (has no administration in time range)  QUEtiapine (SEROQUEL) tablet 600 mg (has no administration in time range)   stroke: early stages of recovery book (has no administration in time range)  sodium chloride 0.9 % bolus 500 mL (0 mLs Intravenous Stopped 04/06/22 2223)  iohexol (OMNIPAQUE) 350 MG/ML injection 60 mL (60 mLs Intravenous Contrast Given 04/06/22 2132)     IMPRESSION / MDM / ASSESSMENT AND PLAN / ED COURSE  I reviewed the triage vital signs and the nursing notes.                              Differential diagnosis includes, but is not limited to, possible recurrent infection, intracranial hemorrhage in the setting of anticoagulation, stroke, central neurologic causation, metabolic infectious, polypharmacy type abnormality, etc.  Differential diagnosis quite broad and we will work it up her multiple status quite broadly as well.  Patient's presentation is most consistent with acute presentation with potential threat to life or bodily function.   The patient is on the cardiac monitor to evaluate for evidence of arrhythmia and/or significant heart rate changes.     ----------------------------------------- 10:50 PM on 04/06/2022 ----------------------------------------- Thus far, no clear causation for the patient's somnolence.  Findings on her CT are somewhat concerning for potential multiple myeloma, but her calcium level is normal and she does not show obvious symptoms that would suggest that to be the cause of altered mental status.  CT of the head negative for acute finding.  No pulmonary embolism.  She continues to rest comfortably, in no distress but remains somnolent.  Venous gas without acute abnormality.  No respiratory distress etc.  Urinalysis no clear evidence of infection  I have consulted with her hospitalist Dr. Posey Pronto, and we discussed her workup to this point and the hospital service will take over further care and management as  we continue to identify potential causes for somnolence.  Other considerations could be possible medication effect polypharmacy etc.  Consulted with the hospitalist patient except the hospitalist service.  Also discussed some of the findings on her CT of the chest including findings that may be represent multiple myeloma etc.   FINAL CLINICAL IMPRESSION(S) / ED DIAGNOSES   Final diagnoses:  Somnolence  Abnormal CT of the chest     Rx / DC Orders   ED Discharge Orders     None        Note:  This document was prepared using Dragon voice recognition software and may include unintentional dictation  errors.   Delman Kitten, MD 04/06/22 2333

## 2022-04-07 ENCOUNTER — Inpatient Hospital Stay (HOSPITAL_COMMUNITY)
Admit: 2022-04-07 | Discharge: 2022-04-07 | Disposition: A | Discharge status: Home / Self Care | Payer: Medicare Other | Attending: Internal Medicine | Admitting: Internal Medicine

## 2022-04-07 DIAGNOSIS — I5042 Chronic combined systolic (congestive) and diastolic (congestive) heart failure: Secondary | ICD-10-CM | POA: Diagnosis present

## 2022-04-07 DIAGNOSIS — I9589 Other hypotension: Secondary | ICD-10-CM | POA: Diagnosis not present

## 2022-04-07 DIAGNOSIS — F200 Paranoid schizophrenia: Secondary | ICD-10-CM | POA: Diagnosis present

## 2022-04-07 DIAGNOSIS — J9601 Acute respiratory failure with hypoxia: Secondary | ICD-10-CM | POA: Diagnosis present

## 2022-04-07 DIAGNOSIS — E079 Disorder of thyroid, unspecified: Secondary | ICD-10-CM | POA: Diagnosis present

## 2022-04-07 DIAGNOSIS — G928 Other toxic encephalopathy: Secondary | ICD-10-CM

## 2022-04-07 DIAGNOSIS — N1831 Chronic kidney disease, stage 3a: Secondary | ICD-10-CM | POA: Diagnosis present

## 2022-04-07 DIAGNOSIS — Z7901 Long term (current) use of anticoagulants: Secondary | ICD-10-CM | POA: Diagnosis not present

## 2022-04-07 DIAGNOSIS — R531 Weakness: Secondary | ICD-10-CM | POA: Diagnosis present

## 2022-04-07 DIAGNOSIS — G4733 Obstructive sleep apnea (adult) (pediatric): Secondary | ICD-10-CM | POA: Diagnosis present

## 2022-04-07 DIAGNOSIS — I959 Hypotension, unspecified: Secondary | ICD-10-CM

## 2022-04-07 DIAGNOSIS — Z91030 Bee allergy status: Secondary | ICD-10-CM | POA: Diagnosis not present

## 2022-04-07 DIAGNOSIS — F79 Unspecified intellectual disabilities: Secondary | ICD-10-CM | POA: Diagnosis present

## 2022-04-07 DIAGNOSIS — I130 Hypertensive heart and chronic kidney disease with heart failure and stage 1 through stage 4 chronic kidney disease, or unspecified chronic kidney disease: Secondary | ICD-10-CM | POA: Diagnosis present

## 2022-04-07 DIAGNOSIS — E785 Hyperlipidemia, unspecified: Secondary | ICD-10-CM | POA: Diagnosis present

## 2022-04-07 DIAGNOSIS — F32A Depression, unspecified: Secondary | ICD-10-CM | POA: Diagnosis present

## 2022-04-07 DIAGNOSIS — R400 Somnolence: Secondary | ICD-10-CM | POA: Diagnosis present

## 2022-04-07 DIAGNOSIS — Z7982 Long term (current) use of aspirin: Secondary | ICD-10-CM | POA: Diagnosis not present

## 2022-04-07 DIAGNOSIS — I252 Old myocardial infarction: Secondary | ICD-10-CM | POA: Diagnosis not present

## 2022-04-07 DIAGNOSIS — I2510 Atherosclerotic heart disease of native coronary artery without angina pectoris: Secondary | ICD-10-CM | POA: Diagnosis present

## 2022-04-07 DIAGNOSIS — Z1152 Encounter for screening for COVID-19: Secondary | ICD-10-CM | POA: Diagnosis not present

## 2022-04-07 DIAGNOSIS — E1169 Type 2 diabetes mellitus with other specified complication: Secondary | ICD-10-CM | POA: Diagnosis present

## 2022-04-07 DIAGNOSIS — I493 Ventricular premature depolarization: Secondary | ICD-10-CM | POA: Diagnosis present

## 2022-04-07 DIAGNOSIS — E039 Hypothyroidism, unspecified: Secondary | ICD-10-CM | POA: Diagnosis present

## 2022-04-07 DIAGNOSIS — R4182 Altered mental status, unspecified: Secondary | ICD-10-CM | POA: Diagnosis not present

## 2022-04-07 DIAGNOSIS — Z86711 Personal history of pulmonary embolism: Secondary | ICD-10-CM | POA: Diagnosis not present

## 2022-04-07 DIAGNOSIS — E1122 Type 2 diabetes mellitus with diabetic chronic kidney disease: Secondary | ICD-10-CM | POA: Diagnosis present

## 2022-04-07 LAB — HEPATIC FUNCTION PANEL
ALT: 22 U/L (ref 0–44)
AST: 23 U/L (ref 15–41)
Albumin: 2.9 g/dL — ABNORMAL LOW (ref 3.5–5.0)
Alkaline Phosphatase: 105 U/L (ref 38–126)
Bilirubin, Direct: <0.1 mg/dL (ref 0.0–0.2)
Total Bilirubin: 0.6 mg/dL (ref 0.3–1.2)
Total Protein: 6.4 g/dL — ABNORMAL LOW (ref 6.5–8.1)

## 2022-04-07 LAB — LIPID PANEL
Cholesterol: 110 mg/dL (ref 0–200)
HDL: 58 mg/dL (ref >40)
LDL Cholesterol: 32 mg/dL (ref 0–99)
Total CHOL/HDL Ratio: 1.9 RATIO
Triglycerides: 98 mg/dL (ref <150)
VLDL: 20 mg/dL (ref 0–40)

## 2022-04-07 LAB — ECHOCARDIOGRAM COMPLETE: S' Lateral: 5.1 cm

## 2022-04-07 LAB — CBG MONITORING, ED
Glucose-Capillary: 355 mg/dL — ABNORMAL HIGH (ref 70–99)
Glucose-Capillary: 214 mg/dL — ABNORMAL HIGH (ref 70–99)

## 2022-04-07 MED ORDER — GABAPENTIN 100 MG PO CAPS
200.0000 mg | ORAL_CAPSULE | Freq: Two times a day (BID) | ORAL | Status: DC
  Administered 2022-04-07 – 2022-04-09 (×4): 200 mg via ORAL
  Filled 2022-04-07 (×4): qty 2

## 2022-04-07 MED ORDER — POLYETHYLENE GLYCOL 3350 17 G PO PACK
17.0000 g | PACK | Freq: Every day | ORAL | Status: DC
  Administered 2022-04-07 – 2022-04-09 (×2): 17 g via ORAL
  Filled 2022-04-07 (×3): qty 1

## 2022-04-07 MED ORDER — BENZTROPINE MESYLATE 0.5 MG PO TABS
0.5000 mg | ORAL_TABLET | Freq: Two times a day (BID) | ORAL | Status: DC
  Administered 2022-04-07 – 2022-04-09 (×4): 0.5 mg via ORAL
  Filled 2022-04-07 (×4): qty 1

## 2022-04-07 MED ORDER — INSULIN ASPART 100 UNIT/ML IJ SOLN
0.0000 [IU] | Freq: Three times a day (TID) | INTRAMUSCULAR | Status: DC
  Administered 2022-04-07: 3 [IU] via SUBCUTANEOUS
  Administered 2022-04-07: 9 [IU] via SUBCUTANEOUS
  Administered 2022-04-08: 1 [IU] via SUBCUTANEOUS
  Administered 2022-04-08: 2 [IU] via SUBCUTANEOUS
  Administered 2022-04-08: 3 [IU] via SUBCUTANEOUS
  Administered 2022-04-09: 5 [IU] via SUBCUTANEOUS
  Filled 2022-04-07 (×6): qty 1

## 2022-04-07 MED ORDER — SENNA 8.6 MG PO TABS
1.0000 | ORAL_TABLET | Freq: Every day | ORAL | Status: DC
  Administered 2022-04-07 – 2022-04-09 (×3): 8.6 mg via ORAL
  Filled 2022-04-07 (×3): qty 1

## 2022-04-07 NOTE — Evaluation (Signed)
Occupational Therapy Evaluation Patient Details Name: Elizabeth Gutierrez MRN: [id number] DOB: [birth year] Today's Date: 04/07/2022   History of Present Illness Elizabeth Gutierrez is an 62 y.o. female past medical history of hypertension (heart failure pulmonary embolism diabetes mellitus type 2 from group home excessive sleepiness and somnolence.  On initial arrival patient was noted to be mildly hypotensive and given a fluid bolus challenge change patient responded well with blood pressure improving.  O2 sats were also 89% which improved to 98% on 2 L nasal cannula.   Clinical Impression   Patient presenting with decreased Ind in self care,balance, functional mobility/transfers, endurance, and safety awareness. Per chart review, pt lives in group home and reports some occasional assist with self care and ambulation with RW at baseline. She is likely close to baseline. Pt follows commands with increased time and so redirection during session. Pt ambulates with close supervision and use of RW to bathroom for toileting needs with min guard for balance during clothing management and hygiene. Patient will benefit from acute OT to increase overall independence in the areas of ADLs, functional mobility, and safety awareness in order to safely discharge back to group home.      Recommendations for follow up therapy are one component of a multi-disciplinary discharge planning process, led by the attending physician.  Recommendations may be updated based on patient status, additional functional criteria and insurance authorization.   Follow Up Recommendations  Home health OT     Assistance Recommended at Discharge Intermittent Supervision/Assistance  Patient can return home with the following A little help with walking and/or transfers;A little help with bathing/dressing/bathroom    Functional Status Assessment  Patient has had a recent decline in their functional status and demonstrates the ability to make  significant improvements in function in a reasonable and predictable amount of time.  Equipment Recommendations  None recommended by OT       Precautions / Restrictions Precautions Precautions: Fall Restrictions Weight Bearing Restrictions: No      Mobility Bed Mobility Overal bed mobility: Modified Independent             General bed mobility comments: pt did best with increased time and gestures towards EOB with increased time to initiate    Transfers Overall transfer level: Needs assistance Equipment used: Rolling walker (2 wheels) Transfers: Sit to/from Stand Sit to Stand: Min guard                  Balance Overall balance assessment: Needs assistance Sitting-balance support: No upper extremity supported Sitting balance-Leahy Scale: Good     Standing balance support: Bilateral upper extremity supported Standing balance-Leahy Scale: Fair Standing balance comment: leaning on walker with no LOBs.  Cues for situational awareness during dynamic standing tasks                           ADL either performed or assessed with clinical judgement   ADL Overall ADL's : Needs assistance/impaired                         Toilet Transfer: Supervision/safety;Regular Toilet;Rolling walker (2 wheels)   Toileting- Clothing Manipulation and Hygiene: Min guard;Sit to/from stand               Vision Patient Visual Report: No change from baseline              Pertinent Vitals/Pain Pain Assessment Pain Assessment: No/denies  pain        Extremity/Trunk Assessment Upper Extremity Assessment Upper Extremity Assessment: Generalized weakness   Lower Extremity Assessment Lower Extremity Assessment: Generalized weakness   Cervical / Trunk Assessment Cervical / Trunk Assessment: Kyphotic   Communication Communication Communication: No difficulties   Cognition Arousal/Alertness: Awake/alert Behavior During Therapy: Restless Overall  Cognitive Status: History of cognitive impairments - at baseline                                 General Comments: Pt alert to self only and follows simple commands with increased time.                Home Living Family/patient expects to be discharged to:: Group home                                        Prior Functioning/Environment Prior Level of Function :  (at group home)             Mobility Comments: Pt indicates that she is using a walker with 4 wheels (unlike the FWW that PT brought) ADLs Comments: at group home, poor historian but she indicates she can manage bathroom clean up, etc?        OT Problem List: Decreased strength;Decreased activity tolerance;Decreased safety awareness;Impaired balance (sitting and/or standing);Decreased knowledge of use of DME or AE      OT Treatment/Interventions: Self-care/ADL training;Therapeutic exercise;Therapeutic activities;DME and/or AE instruction;Patient/family education;Balance training    OT Goals(Current goals can be found in the care plan section) Acute Rehab OT Goals Patient Stated Goal: I want some food OT Goal Formulation: With patient Time For Goal Achievement: 04/21/22 Potential to Achieve Goals: Fair ADL Goals Pt Will Perform Grooming: with modified independence;standing Pt Will Perform Lower Body Dressing: with modified independence;sit to/from stand Pt Will Transfer to Toilet: with modified independence;ambulating Pt Will Perform Toileting - Clothing Manipulation and hygiene: with modified independence;sit to/from stand  OT Frequency: Min 2X/week       AM-PAC OT "6 Clicks" Daily Activity     Outcome Measure Help from another person eating meals?: None Help from another person taking care of personal grooming?: None Help from another person toileting, which includes using toliet, bedpan, or urinal?: A Little Help from another person bathing (including washing, rinsing,  drying)?: A Little Help from another person to put on and taking off regular upper body clothing?: None Help from another person to put on and taking off regular lower body clothing?: A Little 6 Click Score: 21   End of Session Equipment Utilized During Treatment: Rolling walker (2 wheels) Nurse Communication: Mobility status  Activity Tolerance: Patient tolerated treatment well Patient left: in bed;with bed alarm set  OT Visit Diagnosis: Unsteadiness on feet (R26.81);Repeated falls (R29.6);Muscle weakness (generalized) (M62.81)                Time: 9147-8295 OT Time Calculation (min): 12 min Charges:  OT General Charges $OT Visit: 1 Visit OT Evaluation $OT Eval Low Complexity: 1 Low  Darleen Crocker, MS, OTR/L , CBIS ascom [phone]  04/07/22, 1:12 PM

## 2022-04-07 NOTE — ED Notes (Signed)
Pt removed from oxygen per MD [place].

## 2022-04-07 NOTE — ED Notes (Signed)
Ot at bedside

## 2022-04-07 NOTE — ED Notes (Signed)
Pt at bedside

## 2022-04-07 NOTE — Progress Notes (Signed)
*  PRELIMINARY RESULTS* Echocardiogram 2D Echocardiogram has been performed.  Elizabeth Gutierrez 04/07/2022, 10:28 AM

## 2022-04-07 NOTE — ED Notes (Addendum)
EEG at bedside.

## 2022-04-07 NOTE — Progress Notes (Signed)
Eeg done 

## 2022-04-07 NOTE — Progress Notes (Addendum)
Progress Note    JENNIEFER SALAK  UYQ:[id number] DOB: January 21, 1961  DOA: 04/06/2022 PCP: Denton Lank, MD      Brief Narrative:    Medical records reviewed and are as summarized below:  CHANDA LAPERLE is a 62 y.o. female with medical history significant for chronic systolic CHF, CAD, type II DM, hypertension, dyslipidemia, primary schizophrenia, hypothyroidism, acute pulmonary embolism diagnosed in December 2023, hospitalization for sepsis secondary to UTI in December 2023, CKD stage IIIa, who was brought to the hospital because of low saturation at 88% on room air, confusion and somnolence.  She was hypotensive in the emergency department with BP as low as 84/60.     Assessment/Plan:   Principal Problem:   Somnolence Active Problems:   Toxic metabolic encephalopathy   Acute hypoxic respiratory failure (HCC)   Hypotension   Type 2 diabetes mellitus with hyperlipidemia (HCC)   Chronic combined systolic (congestive) and diastolic (congestive) heart failure (HCC)   CAD in native artery   Obstructive apnea   Paranoid schizophrenia (HCC)   Hypothyroidism   Chronic kidney disease, stage 3a (HCC)   Anxiety and depression   Confusion, somnolence, probably from acute toxic metabolic encephalopathy: Probably from polypharmacy. Continue supportive care.  No acute abnormality on MRI brain.  Acute hypoxic respiratory failure: Resolved.  She is tolerating room air  Hypotension: Blood pressure is improved.  Monitor BP closely.  Paranoid schizophrenia: Continue Seroquel.  Elevated creatinine, CKD stage IIIa: Creatinine appears to be close to baseline.  Repeat BMP tomorrow  Numerous tiny scattered lucent lesions throughout the visualized skeletal structures on CT of the chest concerning for multiple myeloma.  Outpatient follow-up with PCP or oncologist for further evaluation.  Chronic systolic and diastolic CHF: Compensated.  2D echo is pending.  Carvedilol on hold.  2D echo in July  2021 showed EF estimated at 30 to 63%, grade 1 diastolic dysfunction.  History of pulmonary embolism in December 2023: Continue Eliquis  Generalized weakness: Consult PT and OT  Other comorbidities include type II DM, anxiety, depression, CKD stage IIIa, hypothyroidism, CAD    Diet Order             Diet Heart Room service appropriate? Yes; Fluid consistency: Thin  Diet effective now                            Consultants: None  Procedures: None    Medications:     stroke: early stages of recovery book   Does not apply Once   apixaban  5 mg Oral BID   atorvastatin  20 mg Oral Daily   benztropine  0.5 mg Oral BID   divalproex  250 mg Oral BID   gabapentin  200 mg Oral BID   insulin aspart  0-9 Units Subcutaneous TID WC   levothyroxine  25 mcg Oral Q0600   polyethylene glycol  17 g Oral Daily   QUEtiapine  100 mg Oral Daily   QUEtiapine  600 mg Oral QHS   senna  1 tablet Oral Daily   Continuous Infusions:   Anti-infectives (From admission, onward)    None              Family Communication/Anticipated D/C date and plan/Code Status   DVT prophylaxis:  apixaban (ELIQUIS) tablet 5 mg     Code Status: Prior  Family Communication: None Disposition Plan: Plan to discharge to group home in 1 to 2  days   Status is: Inpatient Remains inpatient appropriate because: Confusion       Subjective:   Interval events noted.  She is unable to provide an adequate history.  She said"I don't want any surgery" as soon as she saw me.   Objective:    Vitals:   04/07/22 0400 04/07/22 0600 04/07/22 0956 04/07/22 0959  BP: 112/60 115/74 110/65 110/65  Pulse: 74 74 88 88  Resp: '18 16  17  '$ Temp:  98.2 F (36.8 C)  (!) 97.4 F (36.3 C)  TempSrc:    Oral  SpO2: 95% 95% 94% 98%   No data found.   Intake/Output Summary (Last 24 hours) at 04/07/2022 1322 Last data filed at 04/06/2022 2223 Gross per 24 hour  Intake 500 ml  Output 700 ml   Net -200 ml   There were no vitals filed for this visit.  Exam:   GEN: NAD SKIN: Warm and dry EYES: No pallor ENT: MMM CV: RRR PULM: CTA B ABD: soft, ND, NT, +BS CNS: AAO x 2 (person and place), non focal, slurred speech, confused EXT: No edema or tenderness       Data Reviewed:   I have personally reviewed following labs and imaging studies:  Labs: Labs show the following:   Basic Metabolic Panel: Recent Labs  Lab 04/06/22 1756  NA 136  K 4.0  CL 104  CO2 23  GLUCOSE 251*  BUN 25*  CREATININE 1.47*  CALCIUM 8.9   GFR CrCl cannot be calculated (Unknown ideal weight.). Liver Function Tests: Recent Labs  Lab 04/07/22 0838  AST 23  ALT 22  ALKPHOS 105  BILITOT 0.6  PROT 6.4*  ALBUMIN 2.9*   No results for input(s): "LIPASE", "AMYLASE" in the last 168 hours. Recent Labs  Lab 04/06/22 2120  AMMONIA 11   Coagulation profile No results for input(s): "INR", "PROTIME" in the last 168 hours.  CBC: Recent Labs  Lab 04/06/22 1756  WBC 5.7  HGB 10.5*  HCT 37.0  MCV 89.2  PLT 213   Cardiac Enzymes: No results for input(s): "CKTOTAL", "CKMB", "CKMBINDEX", "TROPONINI" in the last 168 hours. BNP (last 3 results) No results for input(s): "PROBNP" in the last 8760 hours. CBG: Recent Labs  Lab 04/07/22 1255  GLUCAP 355*   D-Dimer: No results for input(s): "DDIMER" in the last 72 hours. Hgb A1c: No results for input(s): "HGBA1C" in the last 72 hours. Lipid Profile: Recent Labs    04/07/22 0456  CHOL 110  HDL 58  LDLCALC 32  TRIG 98  CHOLHDL 1.9   Thyroid function studies: No results for input(s): "TSH", "T4TOTAL", "T3FREE", "THYROIDAB" in the last 72 hours.  Invalid input(s): "FREET3" Anemia work up: No results for input(s): "VITAMINB12", "FOLATE", "FERRITIN", "TIBC", "IRON", "RETICCTPCT" in the last 72 hours. Sepsis Labs: Recent Labs  Lab 04/06/22 1756 04/06/22 2120  WBC 5.7  --   LATICACIDVEN  --  0.9     Microbiology Recent Results (from the past 240 hour(s))  Blood culture (single)     Status: None (Preliminary result)   Collection Time: 04/06/22  9:20 PM   Specimen: BLOOD  Result Value Ref Range Status   Specimen Description BLOOD RIGHT ANTECUBITAL  Final   Special Requests   Final    BOTTLES DRAWN AEROBIC AND ANAEROBIC Blood Culture results may not be optimal due to an excessive volume of blood received in culture bottles   Culture   Final    NO GROWTH <  12 HOURS Performed at [facility], [address] [place], [place] [address]    Report Status PENDING  Incomplete  Resp Panel by RT-PCR (Flu A&B, Covid) Anterior Nasal Swab     Status: None   Collection Time: 04/06/22  9:20 PM   Specimen: Anterior Nasal Swab  Result Value Ref Range Status   SARS Coronavirus 2 by RT PCR NEGATIVE NEGATIVE Final    Comment: (NOTE) SARS-CoV-2 target nucleic acids are NOT DETECTED.  The SARS-CoV-2 RNA is generally detectable in upper respiratory specimens during the acute phase of infection. The lowest concentration of SARS-CoV-2 viral copies this assay can detect is 138 copies/mL. A negative result does not preclude SARS-Cov-2 infection and should not be used as the sole basis for treatment or other patient management decisions. A negative result may occur with  improper specimen collection/handling, submission of specimen other than nasopharyngeal swab, presence of viral mutation(s) within the areas targeted by this assay, and inadequate number of viral copies(<138 copies/mL). A negative result must be combined with clinical observations, patient history, and epidemiological information. The expected result is Negative.  Fact Sheet for Patients:  EntrepreneurPulse.com.au  Fact Sheet for Healthcare Providers:  IncredibleEmployment.be  This test is no t yet approved or cleared by the [place] FDA and  has been authorized for detection  and/or diagnosis of SARS-CoV-2 by FDA under an Emergency Use Authorization (EUA). This EUA will remain  in effect (meaning this test can be used) for the duration of the COVID-19 declaration under Section 564(b)(1) of the Act, 21 U.S.C.section 360bbb-3(b)(1), unless the authorization is terminated  or revoked sooner.       Influenza A by PCR NEGATIVE NEGATIVE Final   Influenza B by PCR NEGATIVE NEGATIVE Final    Comment: (NOTE) The Xpert Xpress SARS-CoV-2/FLU/RSV plus assay is intended as an aid in the diagnosis of influenza from Nasopharyngeal swab specimens and should not be used as a sole basis for treatment. Nasal washings and aspirates are unacceptable for Xpert Xpress SARS-CoV-2/FLU/RSV testing.  Fact Sheet for Patients: EntrepreneurPulse.com.au  Fact Sheet for Healthcare Providers: IncredibleEmployment.be  This test is not yet approved or cleared by the [place] FDA and has been authorized for detection and/or diagnosis of SARS-CoV-2 by FDA under an Emergency Use Authorization (EUA). This EUA will remain in effect (meaning this test can be used) for the duration of the COVID-19 declaration under Section 564(b)(1) of the Act, 21 U.S.C. section 360bbb-3(b)(1), unless the authorization is terminated or revoked.  Performed at [facility], [address] [place], [place] [address]     Procedures and diagnostic studies:  MR BRAIN WO CONTRAST  Result Date: 04/07/2022 CLINICAL DATA:  Mental status change, unknown cause EXAM: MRI HEAD WITHOUT CONTRAST TECHNIQUE: Multiplanar, multiecho pulse sequences of the brain and surrounding structures were obtained without intravenous contrast. COMPARISON:  09/19/2019 MRI head, correlation is also made with CT head 04/06/2022 FINDINGS: Brain: No restricted diffusion to suggest acute or subacute infarct. No acute hemorrhage, mass, mass effect, or midline shift. No hydrocephalus or  extra-axial collection. No hemosiderin deposition to suggest remote hemorrhage. Normal pituitary and craniocervical junction. Scattered T2 hyperintense signal in the periventricular white matter, likely the sequela of mild-to-moderate chronic small vessel ischemic disease. Vascular: Patent arterial flow voids. Skull and upper cervical spine: Normal marrow signal. Sinuses/Orbits: Luminal paranasal mucosal thickening. No acute finding in the orbits. Other: The mastoid air cells are well aerated. IMPRESSION: No acute intracranial process. No evidence of acute or  subacute infarct. Electronically Signed   By: Merilyn Baba M.D.   On: 04/07/2022 00:29   CT Angio Chest PE W and/or Wo Contrast  Result Date: 04/06/2022 CLINICAL DATA:  Pulmonary embolus suspected EXAM: CT ANGIOGRAPHY CHEST WITH CONTRAST TECHNIQUE: Multidetector CT imaging of the chest was performed using the standard protocol during bolus administration of intravenous contrast. Multiplanar CT image reconstructions and MIPs were obtained to evaluate the vascular anatomy. RADIATION DOSE REDUCTION: This exam was performed according to the departmental dose-optimization program which includes automated exposure control, adjustment of the mA and/or kV according to patient size and/or use of iterative reconstruction technique. CONTRAST:  41m OMNIPAQUE IOHEXOL 350 MG/ML SOLN COMPARISON:  Chest CT dated September 26, 2019 FINDINGS: Cardiovascular: No pulmonary embolus. Cardiomegaly. Trace pericardial effusion. Normal caliber thoracic aorta with mild atherosclerotic disease. Dilated main pulmonary artery, measuring up to 3.2 cm. Mediastinum/Nodes: Small hiatal hernia and patulous esophagus. Heterogeneous enlarged thyroid. No pathologically enlarged lymph nodes seen in the chest. Lungs/Pleura: Central airways are patent. Bibasilar atelectasis. Mild scattered linear opacities which are likely due to scarring. No consolidation, pleural effusion or pneumothorax. Upper  Abdomen: Gallstones.  No acute abnormality. Musculoskeletal: Numerous tiny scattered lucent lesions are seen throughout the visualized skeletal structures. Review of the MIP images confirms the above findings. IMPRESSION: 1. No evidence of pulmonary embolus. 2. Cardiomegaly with trace pericardial effusion. 3. Dilated main pulmonary artery, can be seen in the setting of pulmonary hypertension. 4. Numerous tiny scattered lucent lesions are seen throughout the visualized skeletal structures, findings are concerning for multiple myeloma. Correlate with laboratory analysis. 5. Aortic Atherosclerosis (ICD10-I70.0). Electronically Signed   By: LYetta GlassmanM.D.   On: 04/06/2022 21:56   CT Head Wo Contrast  Result Date: 04/06/2022 CLINICAL DATA:  Delirium EXAM: CT HEAD WITHOUT CONTRAST TECHNIQUE: Contiguous axial images were obtained from the base of the skull through the vertex without intravenous contrast. RADIATION DOSE REDUCTION: This exam was performed according to the departmental dose-optimization program which includes automated exposure control, adjustment of the mA and/or kV according to patient size and/or use of iterative reconstruction technique. COMPARISON:  CT head 03/07/2022 FINDINGS: Brain: No evidence of large-territorial acute infarction. No parenchymal hemorrhage. No mass lesion. No extra-axial collection. No mass effect or midline shift. No hydrocephalus. Basilar cisterns are patent. Vascular: No hyperdense vessel. Skull: No acute fracture or focal lesion. Sinuses/Orbits: Paranasal sinuses and mastoid air cells are clear. The orbits are unremarkable. Other: None. IMPRESSION: No acute intracranial abnormality. Electronically Signed   By: MIven FinnM.D.   On: 04/06/2022 21:47               LOS: 0 days   Satoya Feeley  [facility]   Pager on [email or web address] If 7PM-7AM, please contact night-coverage at [email or web address]     04/07/2022, 1:22 PM

## 2022-04-07 NOTE — ED Notes (Signed)
Patient transported to MRI 

## 2022-04-07 NOTE — ED Notes (Signed)
Pt requesting "supper". Informed pt dinner tray had already been delivered and she has already eaten it. Pt requesting a snack. Graham crackers and peanut butter provided. WCTM.

## 2022-04-07 NOTE — ED Notes (Signed)
Pt ambulated to and from bathroom with assist from this RN. Pt bed linens changed. Pt given pillow and now in bed resting comfortably watching television.

## 2022-04-07 NOTE — ED Notes (Signed)
Patient transported back from MRI. 

## 2022-04-07 NOTE — ED Notes (Signed)
EEG at this time 

## 2022-04-07 NOTE — ED Notes (Signed)
Pt given breakfast tray, currently feeding self.

## 2022-04-07 NOTE — Evaluation (Signed)
Physical Therapy Evaluation Patient Details Name: Elizabeth Gutierrez MRN: [id number] DOB: 05-20-1960 Today's Date: 04/07/2022  History of Present Illness  Elizabeth Gutierrez is an 62 y.o. female past medical history of hypertension (heart failure pulmonary embolism diabetes mellitus type 2 from group home excessive sleepiness and somnolence.  On initial arrival patient was noted to be mildly hypotensive and given a fluid bolus challenge change patient responded well with blood pressure improving.  O2 sats were also 89% which improved to 98% on 2 L nasal cannula.  Clinical Impression  Pt showed good effort with mobility and ambulation with walker.  She reports she normally uses a nicer walker (per description probably [phone]) and though she is not able to give a lot of clear PLOF/history.  She was able to safely ambulate ~75 ft though she did have some veering R and need for almost constant directional/situational cues.  Pt's O2 had been low, but SpO2 stayed >92% on room air t/o the effort.  Unsure of pt's actual baseline but she is mobile/safe enough to return to group home.  Will maintain on caseload while here to facilitate safe mobility and address functional limitations.       Recommendations for follow up therapy are one component of a multi-disciplinary discharge planning process, led by the attending physician.  Recommendations may be updated based on patient status, additional functional criteria and insurance authorization.  Follow Up Recommendations Home health PT (per progress/baseline may not need PT at d/c)      Assistance Recommended at Discharge Intermittent Supervision/Assistance  Patient can return home with the following  A little help with bathing/dressing/bathroom;Assistance with cooking/housework;Assist for transportation    Equipment Recommendations None recommended by PT  Recommendations for Other Services       Functional Status Assessment Patient has had a recent decline in their  functional status and demonstrates the ability to make significant improvements in function in a reasonable and predictable amount of time.     Precautions / Restrictions Restrictions Weight Bearing Restrictions: No      Mobility  Bed Mobility Overal bed mobility: Modified Independent             General bed mobility comments: Pt needed repeated cuing/explanation to initiate transition to sitting EOB (and getting back into bed) but was able to do so w/o direct assist    Transfers Overall transfer level: Needs assistance Equipment used: Rolling walker (2 wheels) Transfers: Sit to/from Stand Sit to Stand: Min guard           General transfer comment: Again needing some extra cuing/reinforcement to initiate but once she decided to stand she did so w/o direct assist.    Ambulation/Gait Ambulation/Gait assistance: Min guard, Min assist Gait Distance (Feet): 75 Feet Assistive device: Rolling walker (2 wheels)         General Gait Details: Pt with slow but relatively consistent cadence.  She did have some consistent veering to the R but with cuing generally made good adjustments (did hit stationary obstacles X 2 with R wheel).  Pt's O2 remained >92% during the effort on room air  Stairs            Wheelchair Mobility    Modified Rankin (Stroke Patients Only)       Balance Overall balance assessment: Needs assistance Sitting-balance support: No upper extremity supported Sitting balance-Leahy Scale: Good     Standing balance support: Bilateral upper extremity supported Standing balance-Leahy Scale: Fair Standing balance comment: leaning on walker  with no LOBs.  Cues for situational awareness during dynamic standing tasks                             Pertinent Vitals/Pain Pain Assessment Pain Assessment: No/denies pain    Home Living Family/patient expects to be discharged to:: Group home                        Prior Function Prior  Level of Function :  (at group home)             Mobility Comments: Pt indicates that she is using a walker with 4 wheels (unlike the FWW that PT brought) ADLs Comments: at group home, poor historian but she indicates she can manage bathroom clean up, etc?     Hand Dominance        Extremity/Trunk Assessment   Upper Extremity Assessment Upper Extremity Assessment: Generalized weakness    Lower Extremity Assessment Lower Extremity Assessment: Generalized weakness    Cervical / Trunk Assessment Cervical / Trunk Assessment: Kyphotic (L cervical rotation)  Communication   Communication: No difficulties (somewhat garbled/rambling speech - likely baseline per documentation from prior visits)  Cognition Arousal/Alertness: Awake/alert Behavior During Therapy: Restless Overall Cognitive Status: History of cognitive impairments - at baseline                                 General Comments: Pt alert to self, does not have a lot of situational awareness but with extra cuing did know the month and year.        General Comments      Exercises     Assessment/Plan    PT Assessment Patient needs continued PT services  PT Problem List Decreased strength;Decreased activity tolerance;Decreased range of motion;Decreased balance;Decreased mobility;Decreased safety awareness;Decreased cognition;Decreased knowledge of use of DME       PT Treatment Interventions Gait training;Functional mobility training;Therapeutic activities;Therapeutic exercise;Balance training;Cognitive remediation;Patient/family education    PT Goals (Current goals can be found in the Care Plan section)  Acute Rehab PT Goals Patient Stated Goal: go home PT Goal Formulation: With patient Time For Goal Achievement: 04/20/22 Potential to Achieve Goals: Good    Frequency Min 2X/week     Co-evaluation               AM-PAC PT "6 Clicks" Mobility  Outcome Measure Help needed turning from  your back to your side while in a flat bed without using bedrails?: None Help needed moving from lying on your back to sitting on the side of a flat bed without using bedrails?: None Help needed moving to and from a bed to a chair (including a wheelchair)?: None Help needed standing up from a chair using your arms (e.g., wheelchair or bedside chair)?: None Help needed to walk in hospital room?: None Help needed climbing 3-5 steps with a railing? : A Little 6 Click Score: 23    End of Session Equipment Utilized During Treatment: Gait belt Activity Tolerance: Patient tolerated treatment well Patient left: in bed;with call bell/phone within reach Nurse Communication: Mobility status (good SpO2 on room air) PT Visit Diagnosis: Unsteadiness on feet (R26.81);Muscle weakness (generalized) (M62.81)    Time: 2440-1027 PT Time Calculation (min) (ACUTE ONLY): 17 min   Charges:   PT Evaluation $PT Eval Low Complexity: 1 Low  Kreg Shropshire, DPT 04/07/2022, 11:46 AM

## 2022-04-07 NOTE — Procedures (Signed)
Patient Name: Elizabeth Gutierrez  MRN: [id number]  Epilepsy Attending: Lora Havens  Referring Physician/Provider: Para Skeans, MD  Date: 04/07/2022 Duration: 22.54 mins  Patient history: 62yo F with ams. EEG to evaluate for seizure.   Level of alertness: Awake, asleep  AEDs during EEG study: VPA, GBP  Technical aspects: This EEG study was done with scalp electrodes positioned according to the 10-20 International system of electrode placement. Electrical activity was reviewed with band pass filter of 1-'70Hz'$ , sensitivity of 7 uV/mm, display speed of 40m/sec with a '60Hz'$  notched filter applied as appropriate. EEG data were recorded continuously and digitally stored.  Video monitoring was available and reviewed as appropriate.  Description: The posterior dominant rhythm consists of 8 Hz activity of moderate voltage (25-35 uV) seen predominantly in posterior head regions, symmetric and reactive to eye opening and eye closing. Sleep was characterized by vertex waves, sleep spindles (12 to 14 Hz), maximal frontocentral region. Hyperventilation and photic stimulation were not performed.     IMPRESSION: This study is within normal limits. No seizures or epileptiform discharges were seen throughout the recording.  [name] [name]

## 2022-04-07 NOTE — Progress Notes (Signed)
SLP Cancellation Note  Patient Details Name: Elizabeth Gutierrez MRN: [id number] DOB: 06-19-1960   Cancelled treatment:       Reason Eval/Treat Not Completed: SLP screened, no needs identified, will sign off  Pt is at or near baseline. If comm/cog needs arise upon return to normal routines, please consult for home health or outpatient speech services.  Sloan Takagi B. Quentin Ore, [facility], CCC-SLP Speech Language Pathologist  Shonna Chock 04/07/2022, 9:50 AM

## 2022-04-07 NOTE — ED Notes (Signed)
Pt given 2 packs of graham crackers and cup of ice water.

## 2022-04-07 NOTE — ED Notes (Addendum)
Md Ayiku at pt bedside

## 2022-04-08 ENCOUNTER — Other Ambulatory Visit: Payer: Self-pay

## 2022-04-08 ENCOUNTER — Encounter: Payer: Self-pay | Admitting: Internal Medicine

## 2022-04-08 DIAGNOSIS — F200 Paranoid schizophrenia: Secondary | ICD-10-CM | POA: Diagnosis not present

## 2022-04-08 DIAGNOSIS — J9601 Acute respiratory failure with hypoxia: Secondary | ICD-10-CM | POA: Diagnosis not present

## 2022-04-08 DIAGNOSIS — G928 Other toxic encephalopathy: Secondary | ICD-10-CM | POA: Diagnosis not present

## 2022-04-08 DIAGNOSIS — R400 Somnolence: Secondary | ICD-10-CM | POA: Diagnosis not present

## 2022-04-08 LAB — BASIC METABOLIC PANEL
Anion gap: 5 (ref 5–15)
BUN: 24 mg/dL — ABNORMAL HIGH (ref 8–23)
CO2: 27 mmol/L (ref 22–32)
Calcium: 8.8 mg/dL — ABNORMAL LOW (ref 8.9–10.3)
Chloride: 106 mmol/L (ref 98–111)
Creatinine, Ser: 1.53 mg/dL — ABNORMAL HIGH (ref 0.44–1.00)
GFR, Estimated: 38 mL/min — ABNORMAL LOW (ref >60)
Glucose, Bld: 212 mg/dL — ABNORMAL HIGH (ref 70–99)
Potassium: 4.1 mmol/L (ref 3.5–5.1)
Sodium: 138 mmol/L (ref 135–145)

## 2022-04-08 LAB — URINE CULTURE: Culture: NO GROWTH

## 2022-04-08 LAB — GLUCOSE, CAPILLARY: Glucose-Capillary: 196 mg/dL — ABNORMAL HIGH (ref 70–99)

## 2022-04-08 LAB — CBG MONITORING, ED
Glucose-Capillary: 140 mg/dL — ABNORMAL HIGH (ref 70–99)
Glucose-Capillary: 196 mg/dL — ABNORMAL HIGH (ref 70–99)
Glucose-Capillary: 248 mg/dL — ABNORMAL HIGH (ref 70–99)

## 2022-04-08 MED ORDER — CARVEDILOL 3.125 MG PO TABS
3.1250 mg | ORAL_TABLET | Freq: Two times a day (BID) | ORAL | Status: DC
  Administered 2022-04-08 – 2022-04-09 (×2): 3.125 mg via ORAL
  Filled 2022-04-08 (×2): qty 1

## 2022-04-08 NOTE — Progress Notes (Signed)
Progress Note    Elizabeth Gutierrez  OYD:[id number] DOB: 1961-01-11  DOA: 04/06/2022 PCP: Denton Lank, MD      Brief Narrative:    Medical records reviewed and are as summarized below:  Elizabeth Gutierrez is a 62 y.o. female with medical history significant for chronic systolic CHF, CAD, type II DM, hypertension, dyslipidemia, primary schizophrenia, hypothyroidism, acute pulmonary embolism diagnosed in December 2023, hospitalization for sepsis secondary to UTI in December 2023, CKD stage IIIa, who was brought to the hospital because of low saturation at 88% on room air, confusion and somnolence.  She was hypotensive in the emergency department with BP as low as 84/60.     Assessment/Plan:   Principal Problem:   Somnolence Active Problems:   Toxic metabolic encephalopathy   Acute hypoxic respiratory failure (HCC)   Hypotension   Type 2 diabetes mellitus with hyperlipidemia (HCC)   Chronic combined systolic (congestive) and diastolic (congestive) heart failure (HCC)   CAD in native artery   Obstructive apnea   Paranoid schizophrenia (HCC)   Hypothyroidism   Chronic kidney disease, stage 3a (HCC)   Anxiety and depression   Confusion, somnolence, probably from acute toxic metabolic encephalopathy: Probably from polypharmacy. Continue supportive care.  No acute abnormality on MRI brain.  EEG did not show any evidence of seizures or epileptiform discharge.  Acute hypoxic respiratory failure: Patient is intermittently hypoxic.  Check oxygen saturation with ambulation to determine need for oxygen at discharge.  Hypotension: Resolved  Paranoid schizophrenia: Continue Seroquel.  Elevated creatinine, CKD stage IIIa: Creatinine is trending upward.  Continue to monitor.   Numerous tiny scattered lucent lesions throughout the visualized skeletal structures on CT of the chest concerning for multiple myeloma.  Outpatient follow-up with PCP or oncologist for further evaluation.  Chronic  systolic and diastolic CHF: Compensated.  2D echo showed EF estimated at 25 to 30%.  Resume carvedilol.  2D echo in July 2021 showed EF estimated at 30 to 67%, grade 1 diastolic dysfunction.  History of pulmonary embolism in December 2023: Continue Eliquis  Generalized weakness: PT and OT recommended home health therapy  Other comorbidities include type II DM, anxiety, depression, CKD stage IIIa, hypothyroidism, CAD  Monitor patient overnight with plan to discharge to group home tomorrow.  Diet Order             Diet Heart Room service appropriate? Yes; Fluid consistency: Thin  Diet effective now                            Consultants: None  Procedures: None    Medications:     stroke: early stages of recovery book   Does not apply Once   apixaban  5 mg Oral BID   atorvastatin  20 mg Oral Daily   benztropine  0.5 mg Oral BID   divalproex  250 mg Oral BID   gabapentin  200 mg Oral BID   insulin aspart  0-9 Units Subcutaneous TID WC   levothyroxine  25 mcg Oral Q0600   polyethylene glycol  17 g Oral Daily   QUEtiapine  100 mg Oral Daily   QUEtiapine  600 mg Oral QHS   senna  1 tablet Oral Daily   Continuous Infusions:   Anti-infectives (From admission, onward)    None              Family Communication/Anticipated D/C date and plan/Code Status   DVT  prophylaxis:  apixaban (ELIQUIS) tablet 5 mg     Code Status: Prior  Family Communication: None Disposition Plan: Plan to discharge to group home tomorrow  Status is: Inpatient Remains inpatient appropriate because: Increasing creatinine, hypoxia       Subjective:   Interval events noted.  She has no complaints.  No shortness of breath or chest pain.  Patient noted to be hypoxic on room air intermittently.   Objective:    Vitals:   04/08/22 0217 04/08/22 0351 04/08/22 0730 04/08/22 1105  BP:  120/71 123/77 127/76  Pulse:  82 81 80  Resp:  '18 16 19  '$ Temp: 98.3 F (36.8 C)   98.1 F (36.7 C) 98 F (36.7 C)  TempSrc: Oral     SpO2:  94% 95% 93%   No data found.  No intake or output data in the 24 hours ending 04/08/22 1349  There were no vitals filed for this visit.  Exam:   GEN: NAD SKIN: Warm and dry EYES: EOMI ENT: MMM CV: RRR PULM: CTA B ABD: soft, ND, NT, +BS CNS: AAO x 2 (person and place), non focal, slurred speech likely her baseline EXT: No edema or tenderness      Data Reviewed:   I have personally reviewed following labs and imaging studies:  Labs: Labs show the following:   Basic Metabolic Panel: Recent Labs  Lab 04/06/22 1756 04/08/22 0438  NA 136 138  K 4.0 4.1  CL 104 106  CO2 23 27  GLUCOSE 251* 212*  BUN 25* 24*  CREATININE 1.47* 1.53*  CALCIUM 8.9 8.8*   GFR CrCl cannot be calculated (Unknown ideal weight.). Liver Function Tests: Recent Labs  Lab 04/07/22 0838  AST 23  ALT 22  ALKPHOS 105  BILITOT 0.6  PROT 6.4*  ALBUMIN 2.9*   No results for input(s): "LIPASE", "AMYLASE" in the last 168 hours. Recent Labs  Lab 04/06/22 2120  AMMONIA 11   Coagulation profile No results for input(s): "INR", "PROTIME" in the last 168 hours.  CBC: Recent Labs  Lab 04/06/22 1756  WBC 5.7  HGB 10.5*  HCT 37.0  MCV 89.2  PLT 213   Cardiac Enzymes: No results for input(s): "CKTOTAL", "CKMB", "CKMBINDEX", "TROPONINI" in the last 168 hours. BNP (last 3 results) No results for input(s): "PROBNP" in the last 8760 hours. CBG: Recent Labs  Lab 04/07/22 1255 04/07/22 1638 04/08/22 0802 04/08/22 1113  GLUCAP 355* 214* 140* 196*   D-Dimer: No results for input(s): "DDIMER" in the last 72 hours. Hgb A1c: No results for input(s): "HGBA1C" in the last 72 hours. Lipid Profile: Recent Labs    04/07/22 0456  CHOL 110  HDL 58  LDLCALC 32  TRIG 98  CHOLHDL 1.9   Thyroid function studies: No results for input(s): "TSH", "T4TOTAL", "T3FREE", "THYROIDAB" in the last 72 hours.  Invalid input(s):  "FREET3" Anemia work up: No results for input(s): "VITAMINB12", "FOLATE", "FERRITIN", "TIBC", "IRON", "RETICCTPCT" in the last 72 hours. Sepsis Labs: Recent Labs  Lab 04/06/22 1756 04/06/22 2120  WBC 5.7  --   LATICACIDVEN  --  0.9    Microbiology Recent Results (from the past 240 hour(s))  Blood culture (single)     Status: None (Preliminary result)   Collection Time: 04/06/22  9:20 PM   Specimen: BLOOD  Result Value Ref Range Status   Specimen Description BLOOD RIGHT ANTECUBITAL  Final   Special Requests   Final    BOTTLES DRAWN AEROBIC AND ANAEROBIC Blood  Culture results may not be optimal due to an excessive volume of blood received in culture bottles   Culture   Final    NO GROWTH 2 DAYS Performed at [facility], [address] [place], [place] [address]    Report Status PENDING  Incomplete  Resp Panel by RT-PCR (Flu A&B, Covid) Anterior Nasal Swab     Status: None   Collection Time: 04/06/22  9:20 PM   Specimen: Anterior Nasal Swab  Result Value Ref Range Status   SARS Coronavirus 2 by RT PCR NEGATIVE NEGATIVE Final    Comment: (NOTE) SARS-CoV-2 target nucleic acids are NOT DETECTED.  The SARS-CoV-2 RNA is generally detectable in upper respiratory specimens during the acute phase of infection. The lowest concentration of SARS-CoV-2 viral copies this assay can detect is 138 copies/mL. A negative result does not preclude SARS-Cov-2 infection and should not be used as the sole basis for treatment or other patient management decisions. A negative result may occur with  improper specimen collection/handling, submission of specimen other than nasopharyngeal swab, presence of viral mutation(s) within the areas targeted by this assay, and inadequate number of viral copies(<138 copies/mL). A negative result must be combined with clinical observations, patient history, and epidemiological information. The expected result is Negative.  Fact Sheet for Patients:   EntrepreneurPulse.com.au  Fact Sheet for Healthcare Providers:  IncredibleEmployment.be  This test is no t yet approved or cleared by the [place] FDA and  has been authorized for detection and/or diagnosis of SARS-CoV-2 by FDA under an Emergency Use Authorization (EUA). This EUA will remain  in effect (meaning this test can be used) for the duration of the COVID-19 declaration under Section 564(b)(1) of the Act, 21 U.S.C.section 360bbb-3(b)(1), unless the authorization is terminated  or revoked sooner.       Influenza A by PCR NEGATIVE NEGATIVE Final   Influenza B by PCR NEGATIVE NEGATIVE Final    Comment: (NOTE) The Xpert Xpress SARS-CoV-2/FLU/RSV plus assay is intended as an aid in the diagnosis of influenza from Nasopharyngeal swab specimens and should not be used as a sole basis for treatment. Nasal washings and aspirates are unacceptable for Xpert Xpress SARS-CoV-2/FLU/RSV testing.  Fact Sheet for Patients: EntrepreneurPulse.com.au  Fact Sheet for Healthcare Providers: IncredibleEmployment.be  This test is not yet approved or cleared by the [place] FDA and has been authorized for detection and/or diagnosis of SARS-CoV-2 by FDA under an Emergency Use Authorization (EUA). This EUA will remain in effect (meaning this test can be used) for the duration of the COVID-19 declaration under Section 564(b)(1) of the Act, 21 U.S.C. section 360bbb-3(b)(1), unless the authorization is terminated or revoked.  Performed at [facility], [address], [place], [place] [address]   Urine Culture     Status: None   Collection Time: 04/06/22  9:20 PM   Specimen: Urine, Catheterized  Result Value Ref Range Status   Specimen Description   Final    URINE, CATHETERIZED Performed at [facility], [address] [place], [place] [address]    Special Requests   Final     NONE Performed at [facility], [address], [place], [place] [address]    Culture   Final    NO GROWTH Performed at [facility], [place] [address] [place], [place] [address]    Report Status 04/08/2022 FINAL  Final    Procedures and diagnostic studies:  ECHOCARDIOGRAM COMPLETE  Result Date: 04/07/2022    ECHOCARDIOGRAM  REPORT   Patient Name:   Elizabeth Gutierrez Date of Exam: 04/07/2022 Medical Rec #:  [id number]     Height:       72.0 in Accession #:    [id number]    Weight:       150.0 lb Date of Birth:  1960-04-18     BSA:          1.885 m Patient Age:    33 years      BP:           110/65 mmHg Patient Gender: F             HR:           88 bpm. Exam Location:  [facility] Procedure: 2D Echo, Cardiac Doppler and Color Doppler Indications:     Altered mental status  History:         Patient has prior history of Echocardiogram examinations, most                  recent 09/27/2019. CHF; Risk Factors:Hypertension. Heart attack.  Sonographer:     Sherrie Sport Referring Phys:  [place] Diagnosing Phys: Ida Rogue MD  Sonographer Comments: Technically challenging study due to limited acoustic windows, no apical window and no subcostal window. Image acquisition challenging due to patient body habitus and Pt was non-verbal. IMPRESSIONS  1. Left ventricular ejection fraction, by estimation, is 25 to 30%. The left ventricle has severely decreased function. Mildly dilated LV.  2. The left ventricle demonstrates global hypokinesis. basal wall motion best preserved. Apical images not available. Left ventricular diastolic parameters are indeterminate.  3. Right ventricular systolic function is normal. The right ventricular size is normal. There is normal pulmonary artery systolic pressure. The estimated right ventricular systolic pressure is 86.5 mmHg.  4. The mitral valve is normal in structure. No evidence of mitral valve regurgitation. No evidence of mitral stenosis.  5. The aortic valve has an  indeterminant number of cusps. Aortic valve regurgitation is not visualized. No aortic stenosis is present.  6. The inferior vena cava is normal in size with greater than 50% respiratory variability, suggesting right atrial pressure of 3 mmHg. FINDINGS  Left Ventricle: Left ventricular ejection fraction, by estimation, is 25 to 30%. The left ventricle has severely decreased function. The left ventricle demonstrates global hypokinesis. The left ventricular internal cavity size was mildly dilated. There is no left ventricular hypertrophy. Left ventricular diastolic parameters are indeterminate. Right Ventricle: The right ventricular size is normal. No increase in right ventricular wall thickness. Right ventricular systolic function is normal. There is normal pulmonary artery systolic pressure. The tricuspid regurgitant velocity is 1.41 m/s, and  with an assumed right atrial pressure of 5 mmHg, the estimated right ventricular systolic pressure is 78.4 mmHg. Left Atrium: Left atrial size was normal in size. Right Atrium: Right atrial size was normal in size. Pericardium: There is no evidence of pericardial effusion. Mitral Valve: The mitral valve is normal in structure. No evidence of mitral valve regurgitation. No evidence of mitral valve stenosis. Tricuspid Valve: The tricuspid valve is normal in structure. Tricuspid valve regurgitation is not demonstrated. No evidence of tricuspid stenosis. Aortic Valve: The aortic valve has an indeterminant number of cusps. Aortic valve regurgitation is not visualized. No aortic stenosis is present. Pulmonic Valve: The pulmonic valve was normal in structure. Pulmonic valve regurgitation is not visualized. No evidence of pulmonic stenosis. Aorta: The aortic root is normal in size and structure.  Venous: The inferior vena cava is normal in size with greater than 50% respiratory variability, suggesting right atrial pressure of 3 mmHg. IAS/Shunts: No atrial level shunt detected by color  flow Doppler.  LEFT VENTRICLE PLAX 2D LVIDd:         5.70 cm LVIDs:         5.10 cm LV PW:         0.80 cm LV IVS:        1.30 cm LVOT diam:     2.20 cm LVOT Area:     3.80 cm  LEFT ATRIUM         Index LA diam:    3.60 cm 1.91 cm/m   AORTA Ao Root diam: 3.30 cm TRICUSPID VALVE TR Peak grad:   8.0 mmHg TR Vmax:        141.00 cm/s  SHUNTS Systemic Diam: 2.20 cm Ida Rogue MD Electronically signed by Ida Rogue MD Signature Date/Time: 04/07/2022/4:00:21 PM    Final    EEG adult  Result Date: 04/07/2022 Lora Havens, MD     04/07/2022  3:23 PM Patient Name: Elizabeth Gutierrez MRN: [id number] Epilepsy Attending: Lora Havens Referring Physician/Provider: Para Skeans, MD Date: 04/07/2022 Duration: 22.54 mins Patient history: 62yo F with ams. EEG to evaluate for seizure. Level of alertness: Awake, asleep AEDs during EEG study: VPA, GBP Technical aspects: This EEG study was done with scalp electrodes positioned according to the 10-20 International system of electrode placement. Electrical activity was reviewed with band pass filter of 1-'70Hz'$ , sensitivity of 7 uV/mm, display speed of 56m/sec with a '60Hz'$  notched filter applied as appropriate. EEG data were recorded continuously and digitally stored.  Video monitoring was available and reviewed as appropriate. Description: The posterior dominant rhythm consists of 8 Hz activity of moderate voltage (25-35 uV) seen predominantly in posterior head regions, symmetric and reactive to eye opening and eye closing. Sleep was characterized by vertex waves, sleep spindles (12 to 14 Hz), maximal frontocentral region. Hyperventilation and photic stimulation were not performed.   IMPRESSION: This study is within normal limits. No seizures or epileptiform discharges were seen throughout the recording. PLora Havens  MR BRAIN WO CONTRAST  Result Date: 04/07/2022 CLINICAL DATA:  Mental status change, unknown cause EXAM: MRI HEAD WITHOUT CONTRAST TECHNIQUE:  Multiplanar, multiecho pulse sequences of the brain and surrounding structures were obtained without intravenous contrast. COMPARISON:  09/19/2019 MRI head, correlation is also made with CT head 04/06/2022 FINDINGS: Brain: No restricted diffusion to suggest acute or subacute infarct. No acute hemorrhage, mass, mass effect, or midline shift. No hydrocephalus or extra-axial collection. No hemosiderin deposition to suggest remote hemorrhage. Normal pituitary and craniocervical junction. Scattered T2 hyperintense signal in the periventricular white matter, likely the sequela of mild-to-moderate chronic small vessel ischemic disease. Vascular: Patent arterial flow voids. Skull and upper cervical spine: Normal marrow signal. Sinuses/Orbits: Luminal paranasal mucosal thickening. No acute finding in the orbits. Other: The mastoid air cells are well aerated. IMPRESSION: No acute intracranial process. No evidence of acute or subacute infarct. Electronically Signed   By: AMerilyn BabaM.D.   On: 04/07/2022 00:29   CT Angio Chest PE W and/or Wo Contrast  Result Date: 04/06/2022 CLINICAL DATA:  Pulmonary embolus suspected EXAM: CT ANGIOGRAPHY CHEST WITH CONTRAST TECHNIQUE: Multidetector CT imaging of the chest was performed using the standard protocol during bolus administration of intravenous contrast. Multiplanar CT image reconstructions and MIPs were obtained to evaluate the vascular anatomy. RADIATION  DOSE REDUCTION: This exam was performed according to the departmental dose-optimization program which includes automated exposure control, adjustment of the mA and/or kV according to patient size and/or use of iterative reconstruction technique. CONTRAST:  29m OMNIPAQUE IOHEXOL 350 MG/ML SOLN COMPARISON:  Chest CT dated September 26, 2019 FINDINGS: Cardiovascular: No pulmonary embolus. Cardiomegaly. Trace pericardial effusion. Normal caliber thoracic aorta with mild atherosclerotic disease. Dilated main pulmonary artery,  measuring up to 3.2 cm. Mediastinum/Nodes: Small hiatal hernia and patulous esophagus. Heterogeneous enlarged thyroid. No pathologically enlarged lymph nodes seen in the chest. Lungs/Pleura: Central airways are patent. Bibasilar atelectasis. Mild scattered linear opacities which are likely due to scarring. No consolidation, pleural effusion or pneumothorax. Upper Abdomen: Gallstones.  No acute abnormality. Musculoskeletal: Numerous tiny scattered lucent lesions are seen throughout the visualized skeletal structures. Review of the MIP images confirms the above findings. IMPRESSION: 1. No evidence of pulmonary embolus. 2. Cardiomegaly with trace pericardial effusion. 3. Dilated main pulmonary artery, can be seen in the setting of pulmonary hypertension. 4. Numerous tiny scattered lucent lesions are seen throughout the visualized skeletal structures, findings are concerning for multiple myeloma. Correlate with laboratory analysis. 5. Aortic Atherosclerosis (ICD10-I70.0). Electronically Signed   By: LYetta GlassmanM.D.   On: 04/06/2022 21:56   CT Head Wo Contrast  Result Date: 04/06/2022 CLINICAL DATA:  Delirium EXAM: CT HEAD WITHOUT CONTRAST TECHNIQUE: Contiguous axial images were obtained from the base of the skull through the vertex without intravenous contrast. RADIATION DOSE REDUCTION: This exam was performed according to the departmental dose-optimization program which includes automated exposure control, adjustment of the mA and/or kV according to patient size and/or use of iterative reconstruction technique. COMPARISON:  CT head 03/07/2022 FINDINGS: Brain: No evidence of large-territorial acute infarction. No parenchymal hemorrhage. No mass lesion. No extra-axial collection. No mass effect or midline shift. No hydrocephalus. Basilar cisterns are patent. Vascular: No hyperdense vessel. Skull: No acute fracture or focal lesion. Sinuses/Orbits: Paranasal sinuses and mastoid air cells are clear. The orbits are  unremarkable. Other: None. IMPRESSION: No acute intracranial abnormality. Electronically Signed   By: MIven FinnM.D.   On: 04/06/2022 21:47               LOS: 1 day   Dulcy Sida  [facility]   Pager on [email or web address] If 7PM-7AM, please contact night-coverage at [email or web address]     04/08/2022, 1:49 PM

## 2022-04-08 NOTE — Inpatient Diabetes Management (Signed)
Inpatient Diabetes Program Recommendations  AACE/ADA: New Consensus Statement on Inpatient Glycemic Control (2015)  Target Ranges:  Prepandial:   less than 140 mg/dL      Peak postprandial:   less than 180 mg/dL (1-2 hours)      Critically ill patients:  140 - 180 mg/dL   Lab Results  Component Value Date   GLUCAP 140 (H) 04/08/2022   HGBA1C 9.8 (H) 03/07/2022    Diabetes history: DM2 Outpatient Diabetes medications: Trulicity 7.34 Weekly, Metformin 500 mg QD, Jardiance 25 mg QD Current orders for Inpatient glycemic control: Novolog 0-9 units TID  Spoke with patient at bedside.  She is tearful and asking why I took her watch.  She has a visitor at bedside who works with the group home where the patient resides.  The staff at the group home check her blood sugar and administer her her DM medications.  Reviewed patient's current A1c of 9.8% (average blood glucose of 235 mg/dL). Explained what a A1c is and what it measures. Also reviewed goal A1c with patient, importance of good glucose control @ home, and blood sugar goals.  Visitor says they will try their best to watch what she is eating and drinking.  Educated on the Plate Method, importance of eliminating caloric beverages and long and short term complications of uncontrolled glucose.    Will continue to follow while inpatient.  Thank you, Reche Dixon, MSN, [place] Diabetes Coordinator Inpatient Diabetes Program [phone] (team pager from 8a-5p)

## 2022-04-08 NOTE — ED Notes (Signed)
Assumed care from [facility]. Pt resting comfortably in bed at this time. Pt denies any current needs or questions. Call light with in reach.

## 2022-04-08 NOTE — ED Notes (Signed)
Pt ambulated to restroom with nurse assist.

## 2022-04-08 NOTE — ED Notes (Signed)
Pt ambulated with wheelchair and nurse stand by to restroom. Pt back resting comfortably in bed at this time.

## 2022-04-08 NOTE — ED Notes (Signed)
Pt given meal tray and sitting up in bed to eat.

## 2022-04-08 NOTE — ED Notes (Signed)
Pt given phone and RN dialed number to mother phone number listed in chart.

## 2022-04-08 NOTE — TOC Initial Note (Signed)
Transition of Care [facility]) - Initial/Assessment Note    Patient Details  Name: Elizabeth Gutierrez MRN: [id number] Date of Birth: 09/14/1960  [facility] CM/SW Contact:    Shelbie Hutching, RN Phone Number: 04/08/2022, 4:37 PM  Clinical Narrative:                 Patient is from [place].  Talked with Elouise Munroe from [name], he says that he thinks patient has home health already with [facility].  He also says that the nurse with [facility] needs to come and eval patient before she is discharged.  Called and left a message with But[facility] [phone]- left a message that patient may discharge over the weekend.   Spoke with [place] with [facility], patient is open with them for RN and PT.    Expected Discharge Plan: [place] Barriers to Discharge: Continued Medical Work up   Patient Goals and CMS Choice   CMS Medicare.gov Compare Post Acute Care list provided to:: Legal Guardian Choice offered to / list presented to : [facility] POA / Guardian      Expected Discharge Plan and Services   Discharge Planning Services: CM Consult Post Acute Care Choice: Resumption of Svcs/PTA Provider Living arrangements for the past 2 months: Group Home                 DME Arranged: N/A DME Agency: NA       HH Arranged: NA HH Agency: NA        Prior Living Arrangements/Services Living arrangements for the past 2 months: Group Home Lives with:: Facility Resident Patient language and need for interpreter reviewed:: Yes Do you feel safe going back to the place where you live?: Yes      Need for Family Participation in Patient Care: Yes (Comment) Care giver support system in place?: Yes (comment) Current home services: DME (walker) Criminal Activity/Legal Involvement Pertinent to Current Situation/Hospitalization: No - Comment as needed  Activities of Daily Living Home Assistive Devices/Equipment: Walker (specify type),  Eyeglasses ADL Screening (condition at time of admission) Patient's cognitive ability adequate to safely complete daily activities?: No Is the patient deaf or have difficulty hearing?: No Does the patient have difficulty seeing, even when wearing glasses/contacts?: No Does the patient have difficulty concentrating, remembering, or making decisions?: Yes Patient able to express need for assistance with ADLs?: Yes Does the patient have difficulty dressing or bathing?: No Independently performs ADLs?: Yes (appropriate for developmental age) Does the patient have difficulty walking or climbing stairs?: No Weakness of Legs: None Weakness of Arms/Hands: None  Permission Sought/Granted Permission sought to share information with : Facility Sport and exercise psychologist, Case Manager, Family Supports    Share Information with NAME: Shaeley Segall  Permission granted to share info w AGENCY: [name]  Permission granted to share info w Relationship: mother/legal guardian  Permission granted to share info w Contact Information: [phone]  Emotional Assessment Appearance:: Appears stated age       Alcohol / Substance Use: Not Applicable    Admission diagnosis:  Somnolence [R40.0] Patient Active Problem List   Diagnosis Date Noted   Acute hypoxic respiratory failure ([place]) 04/07/2022   Hypotension 31/51/7616   Toxic metabolic encephalopathy 07/37/1062   Somnolence 04/06/2022   Sepsis due to gram-negative UTI ([place]) 03/08/2022   Generalized weakness 03/08/2022   Anxiety and depression 03/08/2022   Essential hypertension 03/08/2022   Acute conjunctivitis 03/08/2022  Malnutrition of moderate degree 11/28/2019   Goals of care, counseling/discussion    Palliative care by specialist    DNR (do not resuscitate) discussion    Hypercalcemia    Tachycardia    Weakness    Acute kidney injury superimposed on CKD ([place])    COVID-19 11/22/2019   Bilateral pneumonia 09/28/2019   Shortness of breath  09/26/2019   Hyperlipidemia    Paranoid schizophrenia ([place])    Type 2 diabetes mellitus with hyperlipidemia ([place])    Hypothyroidism    Chronic kidney disease, stage 3a ([place])    Acute on chronic respiratory failure with hypoxia ([place])    Acute metabolic encephalopathy 32/35/5732   Altered mental state 08/17/2016   DDD (degenerative disc disease), lumbar 07/09/2015   Facet syndrome, lumbar 07/09/2015   Lumbar radiculopathy 07/09/2015   Sacroiliac joint dysfunction 07/09/2015   Spinal stenosis, lumbar region, 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   Diabetic neuropathy ([place]) 07/09/2015   CAD in native artery 06/16/2015   Controlled type 2 diabetes mellitus without complication ([place]) 20/25/4270   Benign essential HTN 06/16/2015   Obstructive apnea 01/05/2015   Esophagitis, reflux 08/06/2014   Edema leg 05/14/2014   Chronic combined systolic (congestive) and diastolic (congestive) heart failure ([place]) 05/14/2014   TI (tricuspid incompetence) 05/14/2014   Chest pain 12/13/2013   Combined fat and carbohydrate induced hyperlipemia 10/09/2013   Breath shortness 10/09/2013   PCP:  Denton Lank, MD Pharmacy:   [place], [place] - [address] [place] [address] Phone: [phone] Fax: [phone]     Social Determinants of Health (SDOH) Social History: SDOH Screenings   Food Insecurity: No Food Insecurity (03/08/2022)  Housing: Low Risk  (03/09/2022)  Transportation Needs: No Transportation Needs (03/08/2022)  Utilities: Not At Risk (03/08/2022)  Tobacco Use: Medium Risk (04/08/2022)   SDOH Interventions:     Readmission Risk Interventions     No data to display

## 2022-04-08 NOTE — ED Notes (Signed)
Patient denies pain and is resting comfortably.  

## 2022-04-08 NOTE — ED Notes (Signed)
Pt ambulated with nurse standby assist using front wheel walker to restroom. Pt had bowel movement and urinated.

## 2022-04-09 DIAGNOSIS — I959 Hypotension, unspecified: Secondary | ICD-10-CM | POA: Diagnosis not present

## 2022-04-09 DIAGNOSIS — J9601 Acute respiratory failure with hypoxia: Secondary | ICD-10-CM | POA: Diagnosis not present

## 2022-04-09 DIAGNOSIS — G928 Other toxic encephalopathy: Secondary | ICD-10-CM | POA: Diagnosis not present

## 2022-04-09 DIAGNOSIS — R400 Somnolence: Secondary | ICD-10-CM | POA: Diagnosis not present

## 2022-04-09 LAB — BASIC METABOLIC PANEL
Anion gap: 8 (ref 5–15)
BUN: 23 mg/dL (ref 8–23)
CO2: 25 mmol/L (ref 22–32)
Calcium: 9.3 mg/dL (ref 8.9–10.3)
Chloride: 105 mmol/L (ref 98–111)
Creatinine, Ser: 1.36 mg/dL — ABNORMAL HIGH (ref 0.44–1.00)
GFR, Estimated: 44 mL/min — ABNORMAL LOW (ref >60)
Glucose, Bld: 146 mg/dL — ABNORMAL HIGH (ref 70–99)
Potassium: 4.6 mmol/L (ref 3.5–5.1)
Sodium: 138 mmol/L (ref 135–145)

## 2022-04-09 LAB — GLUCOSE, CAPILLARY
Glucose-Capillary: 223 mg/dL — ABNORMAL HIGH (ref 70–99)
Glucose-Capillary: 282 mg/dL — ABNORMAL HIGH (ref 70–99)
Glucose-Capillary: 185 mg/dL — ABNORMAL HIGH (ref 70–99)

## 2022-04-09 MED ORDER — APIXABAN 5 MG PO TABS: 5.0000 mg | ORAL_TABLET | Freq: Two times a day (BID) | ORAL | Status: DC

## 2022-04-09 NOTE — Discharge Summary (Addendum)
Physician Discharge Summary   Patient: Elizabeth Gutierrez MRN: [id number] DOB: January 24, 1961  Admit date:     04/06/2022  Discharge date: 04/09/22  Discharge Physician: Jennye Boroughs   PCP: Denton Lank, MD   Recommendations at discharge:   Follow-up with PCP in 1 week  Discharge Diagnoses: Principal Problem:   Toxic metabolic encephalopathy Active Problems:   Somnolence   Acute hypoxic respiratory failure (HCC)   Hypotension   Type 2 diabetes mellitus with hyperlipidemia (HCC)   Chronic combined systolic (congestive) and diastolic (congestive) heart failure (HCC)   CAD in native artery   Obstructive apnea   Paranoid schizophrenia ([place])   Hypothyroidism   Chronic kidney disease, stage 3a ([place])   Anxiety and depression  Resolved Problems:   * No resolved hospital problems. [facility] Course:  Elizabeth Gutierrez is a 62 y.o. female with medical history significant for chronic systolic CHF, CAD, type II DM, hypertension, dyslipidemia, primary schizophrenia, hypothyroidism, acute pulmonary embolism diagnosed in December 2023, hospitalization for sepsis secondary to UTI in December 2023, CKD stage IIIa, who was brought to the hospital because of low saturation at 88% on room air, confusion and somnolence.  She was hypotensive in the emergency department with BP as low as 84/60.   She was admitted to the hospital for acute toxic metabolic encephalopathy, probably multifactorial including polypharmacy, hypotensive and hypoxic. She was treated with IV fluids.  She required 2 L/min oxygen via nasal cannula.  Her condition has improved.  She was able to ambulate on room air without oxygen desaturation.  Mental status is back to baseline.  She is deemed stable for discharge to the group home today.    Assessment and Plan:   Confusion, somnolence, probably from acute toxic metabolic encephalopathy: Multifactorial including polypharmacy, hypotension and hypoxia.  Mental status has improved to  baseline.  No acute abnormality on MRI brain.  EEG did not show any evidence of seizures or epileptiform discharge.   Acute hypoxic respiratory failure: Resolved.  She ambulated on room air without oxygen desaturation.   Hypotension: Resolved   Paranoid schizophrenia: Continue Seroquel.   Elevated creatinine, CKD stage IIIa: Creatinine is trending downward.  She is around her baseline creatinine level.   Numerous tiny scattered lucent lesions throughout the visualized skeletal structures on CT of the chest concerning for multiple myeloma.  Outpatient follow-up with PCP or oncologist for further evaluation.   Chronic systolic and diastolic CHF: Compensated.  2D echo showed EF estimated at 25 to 30%.  Continue carvedilol 2D echo in July 2021 showed EF estimated at 30 to 37%, grade 1 diastolic dysfunction.   History of pulmonary embolism in December 2023: Continue Eliquis   Generalized weakness: PT and OT recommended home health therapy   Other comorbidities include type II DM, anxiety, depression, CKD stage IIIa, hypothyroidism, CAD        Consultants: None Procedures performed: None  Disposition: Group home Diet recommendation:  Discharge Diet Orders (From admission, onward)     Start     Ordered   04/09/22 0000  Diet - low sodium heart healthy        04/09/22 1245   04/09/22 0000  Diet Carb Modified        04/09/22 1245           Cardiac and Carb modified diet DISCHARGE MEDICATION: Allergies as of 04/09/2022       Reactions   Bee Venom  Medication List     STOP taking these medications    docusate sodium 100 MG capsule Commonly known as: COLACE   ibuprofen 600 MG tablet Commonly known as: ADVIL       TAKE these medications    acetaminophen 325 MG tablet Commonly known as: TYLENOL Take 650 mg by mouth every 4 (four) hours as needed for moderate pain, fever or headache.   apixaban 5 MG Tabs tablet Commonly known as: ELIQUIS Take 1 tablet  (5 mg total) by mouth 2 (two) times daily. What changed:  how much to take how to take this when to take this additional instructions   ascorbic acid 1000 MG tablet Commonly known as: VITAMIN C Take 1 tablet by mouth daily.   aspirin 81 MG tablet Take 81 mg by mouth daily.   atorvastatin 20 MG tablet Commonly known as: LIPITOR Take 20 mg by mouth daily.   Belsomra 15 MG Tabs Generic drug: Suvorexant Take 1 tablet by mouth at bedtime.   benztropine 0.5 MG tablet Commonly known as: COGENTIN Take 0.5 mg by mouth 2 (two) times daily.   carvedilol 3.125 MG tablet Commonly known as: COREG Take 3.125 mg by mouth 2 (two) times daily.   divalproex 125 MG DR tablet Commonly known as: DEPAKOTE Take 250 mg by mouth 2 (two) times daily.   donepezil 10 MG tablet Commonly known as: ARICEPT Take 1 tablet by mouth at bedtime.   fluPHENAZine decanoate 25 MG/ML injection Commonly known as: PROLIXIN Inject 31.25 mg into the muscle every 14 (fourteen) days.   fluticasone 50 MCG/ACT nasal spray Commonly known as: FLONASE Place 2 sprays into both nostrils daily.   furosemide 20 MG tablet Commonly known as: Lasix Take 1 tablet (20 mg total) by mouth daily as needed for fluid or edema.   gabapentin 100 MG capsule Commonly known as: NEURONTIN Take 200 mg by mouth 3 (three) times daily.   Jardiance 25 MG Tabs tablet Generic drug: empagliflozin Take 25 mg by mouth daily.   ketoconazole 2 % cream Commonly known as: NIZORAL Apply 1 application topically in the morning and at bedtime.   levothyroxine 25 MCG tablet Commonly known as: SYNTHROID Take 1 tablet (25 mcg total) by mouth daily before breakfast.   metFORMIN 500 MG 24 hr tablet Commonly known as: GLUCOPHAGE-XR Take 500 mg by mouth daily with breakfast.   nystatin cream Commonly known as: MYCOSTATIN Apply 1 application topically 2 (two) times daily.   omeprazole 20 MG capsule Commonly known as: PRILOSEC Take 20 mg  by mouth 2 (two) times daily.   polyethylene glycol 17 g packet Commonly known as: MIRALAX / GLYCOLAX Take 17 g by mouth daily.   QUEtiapine 200 MG tablet Commonly known as: SEROQUEL Take 600 mg by mouth at bedtime.   QUEtiapine 100 MG tablet Commonly known as: SEROQUEL Take 100 mg by mouth daily. At noon   RA No Flush Niacin 500 MG tablet Generic drug: niacin Take 1,000 mg by mouth 2 (two) times daily.   senna 8.6 MG tablet Commonly known as: SENOKOT Take 1 tablet by mouth daily.   sertraline 100 MG tablet Commonly known as: ZOLOFT Take 100 mg by mouth in the morning.   Trulicity 8.93 YB/0.1BP Sopn Generic drug: Dulaglutide Inject 0.75 mg into the skin once a week.   zinc sulfate 220 (50 Zn) MG capsule Take 220 mg by mouth daily.        Follow-up Information     [facility]. Schedule an appointment as soon as possible for a visit in 1 week(s).   Specialty: Oncology Why: Multiple lucent lesions on skeletal system concerning for multiple myeloma Contact information: [place], [place] [phone] [address] [place] [phone]               Discharge Exam: Danley Danker Weights   04/08/22 2131  Weight: 66.5 kg   GEN: NAD SKIN: Warm and dry EYES: No pallor or icterus ENT: MMM CV: RRR PULM: CTA B ABD: soft, ND, NT, +BS CNS: AAO x 3, non focal EXT: No edema or tenderness   Condition at discharge: good  The results of significant diagnostics from this hospitalization (including imaging, microbiology, ancillary and laboratory) are listed below for reference.   Imaging Studies: ECHOCARDIOGRAM COMPLETE  Result Date: 04/07/2022    ECHOCARDIOGRAM REPORT   Patient Name:   PRICILLA MOEHLE Date of Exam: 04/07/2022 Medical Rec #:  [id number]     Height:       72.0 in Accession #:    [id number]    Weight:       150.0 lb Date of Birth:  1960-08-04     BSA:          1.885 m Patient Age:    6 years       BP:           110/65 mmHg Patient Gender: F             HR:           88 bpm. Exam Location:  [facility] Procedure: 2D Echo, Cardiac Doppler and Color Doppler Indications:     Altered mental status  History:         Patient has prior history of Echocardiogram examinations, most                  recent 09/27/2019. CHF; Risk Factors:Hypertension. Heart attack.  Sonographer:     Sherrie Sport Referring Phys:  [place] Diagnosing Phys: Ida Rogue MD  Sonographer Comments: Technically challenging study due to limited acoustic windows, no apical window and no subcostal window. Image acquisition challenging due to patient body habitus and Pt was non-verbal. IMPRESSIONS  1. Left ventricular ejection fraction, by estimation, is 25 to 30%. The left ventricle has severely decreased function. Mildly dilated LV.  2. The left ventricle demonstrates global hypokinesis. basal wall motion best preserved. Apical images not available. Left ventricular diastolic parameters are indeterminate.  3. Right ventricular systolic function is normal. The right ventricular size is normal. There is normal pulmonary artery systolic pressure. The estimated right ventricular systolic pressure is 24.5 mmHg.  4. The mitral valve is normal in structure. No evidence of mitral valve regurgitation. No evidence of mitral stenosis.  5. The aortic valve has an indeterminant number of cusps. Aortic valve regurgitation is not visualized. No aortic stenosis is present.  6. The inferior vena cava is normal in size with greater than 50% respiratory variability, suggesting right atrial pressure of 3 mmHg. FINDINGS  Left Ventricle: Left ventricular ejection fraction, by estimation, is 25 to 30%. The left ventricle has severely decreased function. The left ventricle demonstrates global hypokinesis. The left ventricular internal cavity size was mildly dilated. There is no left ventricular hypertrophy. Left ventricular diastolic parameters are indeterminate. Right  Ventricle: The right ventricular size is normal. No increase in right ventricular wall thickness. Right ventricular systolic function is normal. There is normal  pulmonary artery systolic pressure. The tricuspid regurgitant velocity is 1.41 m/s, and  with an assumed right atrial pressure of 5 mmHg, the estimated right ventricular systolic pressure is 48.5 mmHg. Left Atrium: Left atrial size was normal in size. Right Atrium: Right atrial size was normal in size. Pericardium: There is no evidence of pericardial effusion. Mitral Valve: The mitral valve is normal in structure. No evidence of mitral valve regurgitation. No evidence of mitral valve stenosis. Tricuspid Valve: The tricuspid valve is normal in structure. Tricuspid valve regurgitation is not demonstrated. No evidence of tricuspid stenosis. Aortic Valve: The aortic valve has an indeterminant number of cusps. Aortic valve regurgitation is not visualized. No aortic stenosis is present. Pulmonic Valve: The pulmonic valve was normal in structure. Pulmonic valve regurgitation is not visualized. No evidence of pulmonic stenosis. Aorta: The aortic root is normal in size and structure. Venous: The inferior vena cava is normal in size with greater than 50% respiratory variability, suggesting right atrial pressure of 3 mmHg. IAS/Shunts: No atrial level shunt detected by color flow Doppler.  LEFT VENTRICLE PLAX 2D LVIDd:         5.70 cm LVIDs:         5.10 cm LV PW:         0.80 cm LV IVS:        1.30 cm LVOT diam:     2.20 cm LVOT Area:     3.80 cm  LEFT ATRIUM         Index LA diam:    3.60 cm 1.91 cm/m   AORTA Ao Root diam: 3.30 cm TRICUSPID VALVE TR Peak grad:   8.0 mmHg TR Vmax:        141.00 cm/s  SHUNTS Systemic Diam: 2.20 cm Ida Rogue MD Electronically signed by Ida Rogue MD Signature Date/Time: 04/07/2022/4:00:21 PM    Final    EEG adult  Result Date: 04/07/2022 Lora Havens, MD     04/07/2022  3:23 PM Patient Name: MEAGEN LIMONES MRN: [id number]  Epilepsy Attending: Lora Havens Referring Physician/Provider: Para Skeans, MD Date: 04/07/2022 Duration: 22.54 mins Patient history: 62yo F with ams. EEG to evaluate for seizure. Level of alertness: Awake, asleep AEDs during EEG study: VPA, GBP Technical aspects: This EEG study was done with scalp electrodes positioned according to the 10-20 International system of electrode placement. Electrical activity was reviewed with band pass filter of 1-'70Hz'$ , sensitivity of 7 uV/mm, display speed of 73m/sec with a '60Hz'$  notched filter applied as appropriate. EEG data were recorded continuously and digitally stored.  Video monitoring was available and reviewed as appropriate. Description: The posterior dominant rhythm consists of 8 Hz activity of moderate voltage (25-35 uV) seen predominantly in posterior head regions, symmetric and reactive to eye opening and eye closing. Sleep was characterized by vertex waves, sleep spindles (12 to 14 Hz), maximal frontocentral region. Hyperventilation and photic stimulation were not performed.   IMPRESSION: This study is within normal limits. No seizures or epileptiform discharges were seen throughout the recording. PLora Havens  MR BRAIN WO CONTRAST  Result Date: 04/07/2022 CLINICAL DATA:  Mental status change, unknown cause EXAM: MRI HEAD WITHOUT CONTRAST TECHNIQUE: Multiplanar, multiecho pulse sequences of the brain and surrounding structures were obtained without intravenous contrast. COMPARISON:  09/19/2019 MRI head, correlation is also made with CT head 04/06/2022 FINDINGS: Brain: No restricted diffusion to suggest acute or subacute infarct. No acute hemorrhage, mass, mass effect, or midline shift. No hydrocephalus or extra-axial  collection. No hemosiderin deposition to suggest remote hemorrhage. Normal pituitary and craniocervical junction. Scattered T2 hyperintense signal in the periventricular white matter, likely the sequela of mild-to-moderate chronic small  vessel ischemic disease. Vascular: Patent arterial flow voids. Skull and upper cervical spine: Normal marrow signal. Sinuses/Orbits: Luminal paranasal mucosal thickening. No acute finding in the orbits. Other: The mastoid air cells are well aerated. IMPRESSION: No acute intracranial process. No evidence of acute or subacute infarct. Electronically Signed   By: Merilyn Baba M.D.   On: 04/07/2022 00:29   CT Angio Chest PE W and/or Wo Contrast  Result Date: 04/06/2022 CLINICAL DATA:  Pulmonary embolus suspected EXAM: CT ANGIOGRAPHY CHEST WITH CONTRAST TECHNIQUE: Multidetector CT imaging of the chest was performed using the standard protocol during bolus administration of intravenous contrast. Multiplanar CT image reconstructions and MIPs were obtained to evaluate the vascular anatomy. RADIATION DOSE REDUCTION: This exam was performed according to the departmental dose-optimization program which includes automated exposure control, adjustment of the mA and/or kV according to patient size and/or use of iterative reconstruction technique. CONTRAST:  68m OMNIPAQUE IOHEXOL 350 MG/ML SOLN COMPARISON:  Chest CT dated September 26, 2019 FINDINGS: Cardiovascular: No pulmonary embolus. Cardiomegaly. Trace pericardial effusion. Normal caliber thoracic aorta with mild atherosclerotic disease. Dilated main pulmonary artery, measuring up to 3.2 cm. Mediastinum/Nodes: Small hiatal hernia and patulous esophagus. Heterogeneous enlarged thyroid. No pathologically enlarged lymph nodes seen in the chest. Lungs/Pleura: Central airways are patent. Bibasilar atelectasis. Mild scattered linear opacities which are likely due to scarring. No consolidation, pleural effusion or pneumothorax. Upper Abdomen: Gallstones.  No acute abnormality. Musculoskeletal: Numerous tiny scattered lucent lesions are seen throughout the visualized skeletal structures. Review of the MIP images confirms the above findings. IMPRESSION: 1. No evidence of pulmonary  embolus. 2. Cardiomegaly with trace pericardial effusion. 3. Dilated main pulmonary artery, can be seen in the setting of pulmonary hypertension. 4. Numerous tiny scattered lucent lesions are seen throughout the visualized skeletal structures, findings are concerning for multiple myeloma. Correlate with laboratory analysis. 5. Aortic Atherosclerosis (ICD10-I70.0). Electronically Signed   By: LYetta GlassmanM.D.   On: 04/06/2022 21:56   CT Head Wo Contrast  Result Date: 04/06/2022 CLINICAL DATA:  Delirium EXAM: CT HEAD WITHOUT CONTRAST TECHNIQUE: Contiguous axial images were obtained from the base of the skull through the vertex without intravenous contrast. RADIATION DOSE REDUCTION: This exam was performed according to the departmental dose-optimization program which includes automated exposure control, adjustment of the mA and/or kV according to patient size and/or use of iterative reconstruction technique. COMPARISON:  CT head 03/07/2022 FINDINGS: Brain: No evidence of large-territorial acute infarction. No parenchymal hemorrhage. No mass lesion. No extra-axial collection. No mass effect or midline shift. No hydrocephalus. Basilar cisterns are patent. Vascular: No hyperdense vessel. Skull: No acute fracture or focal lesion. Sinuses/Orbits: Paranasal sinuses and mastoid air cells are clear. The orbits are unremarkable. Other: None. IMPRESSION: No acute intracranial abnormality. Electronically Signed   By: MIven FinnM.D.   On: 04/06/2022 21:47    Microbiology: Results for orders placed or performed during the hospital encounter of 04/06/22  Blood culture (single)     Status: None (Preliminary result)   Collection Time: 04/06/22  9:20 PM   Specimen: BLOOD  Result Value Ref Range Status   Specimen Description BLOOD RIGHT ANTECUBITAL  Final   Special Requests   Final    BOTTLES DRAWN AEROBIC AND ANAEROBIC Blood Culture results may not be optimal due to an excessive volume of blood received  in  culture bottles   Culture   Final    NO GROWTH 3 DAYS Performed at [facility], [address] [place], [place] [address]    Report Status PENDING  Incomplete  Resp Panel by RT-PCR (Flu A&B, Covid) Anterior Nasal Swab     Status: None   Collection Time: 04/06/22  9:20 PM   Specimen: Anterior Nasal Swab  Result Value Ref Range Status   SARS Coronavirus 2 by RT PCR NEGATIVE NEGATIVE Final    Comment: (NOTE) SARS-CoV-2 target nucleic acids are NOT DETECTED.  The SARS-CoV-2 RNA is generally detectable in upper respiratory specimens during the acute phase of infection. The lowest concentration of SARS-CoV-2 viral copies this assay can detect is 138 copies/mL. A negative result does not preclude SARS-Cov-2 infection and should not be used as the sole basis for treatment or other patient management decisions. A negative result may occur with  improper specimen collection/handling, submission of specimen other than nasopharyngeal swab, presence of viral mutation(s) within the areas targeted by this assay, and inadequate number of viral copies(<138 copies/mL). A negative result must be combined with clinical observations, patient history, and epidemiological information. The expected result is Negative.  Fact Sheet for Patients:  EntrepreneurPulse.com.au  Fact Sheet for Healthcare Providers:  IncredibleEmployment.be  This test is no t yet approved or cleared by the [place] FDA and  has been authorized for detection and/or diagnosis of SARS-CoV-2 by FDA under an Emergency Use Authorization (EUA). This EUA will remain  in effect (meaning this test can be used) for the duration of the COVID-19 declaration under Section 564(b)(1) of the Act, 21 U.S.C.section 360bbb-3(b)(1), unless the authorization is terminated  or revoked sooner.       Influenza A by PCR NEGATIVE NEGATIVE Final   Influenza B by PCR NEGATIVE NEGATIVE Final     Comment: (NOTE) The Xpert Xpress SARS-CoV-2/FLU/RSV plus assay is intended as an aid in the diagnosis of influenza from Nasopharyngeal swab specimens and should not be used as a sole basis for treatment. Nasal washings and aspirates are unacceptable for Xpert Xpress SARS-CoV-2/FLU/RSV testing.  Fact Sheet for Patients: EntrepreneurPulse.com.au  Fact Sheet for Healthcare Providers: IncredibleEmployment.be  This test is not yet approved or cleared by the [place] FDA and has been authorized for detection and/or diagnosis of SARS-CoV-2 by FDA under an Emergency Use Authorization (EUA). This EUA will remain in effect (meaning this test can be used) for the duration of the COVID-19 declaration under Section 564(b)(1) of the Act, 21 U.S.C. section 360bbb-3(b)(1), unless the authorization is terminated or revoked.  Performed at [facility], [address] [place], [place] [address]   Urine Culture     Status: None   Collection Time: 04/06/22  9:20 PM   Specimen: Urine, Catheterized  Result Value Ref Range Status   Specimen Description   Final    URINE, CATHETERIZED Performed at [facility], [address] [place], [place] [address]    Special Requests   Final    NONE Performed at [facility], [address] [place], [place] [address]    Culture   Final    NO GROWTH Performed at [facility], [place] [address], [place], [place] [address]    Report Status 04/08/2022 FINAL  Final    Labs: CBC: Recent Labs  Lab 04/06/22 1756  WBC 5.7  HGB 10.5*  HCT 37.0  MCV 89.2  PLT 601   Basic Metabolic Panel: Recent  Labs  Lab 04/06/22 1756 04/08/22 0438 04/09/22 0500  NA 136 138 138  K 4.0 4.1 4.6  CL 104 106 105  CO2 '23 27 25  '$ GLUCOSE 251* 212* 146*  BUN 25* 24* 23  CREATININE 1.47* 1.53* 1.36*  CALCIUM 8.9 8.8* 9.3   Liver Function Tests: Recent Labs  Lab 04/07/22 0838  AST 23  ALT 22   ALKPHOS 105  BILITOT 0.6  PROT 6.4*  ALBUMIN 2.9*   CBG: Recent Labs  Lab 04/08/22 1626 04/08/22 2135 04/09/22 0643 04/09/22 0856 04/09/22 1233  GLUCAP 248* 196* 223* 282* 185*    Discharge time spent: greater than 30 minutes.  Signed: Jennye Boroughs, MD [facility] 04/09/2022

## 2022-04-09 NOTE — Progress Notes (Signed)
Pt's SpO2 98% ambulating on room air.

## 2022-04-09 NOTE — Progress Notes (Signed)
Occupational Therapy Treatment Patient Details Name: Elizabeth Gutierrez MRN: [id number] DOB: 1960/09/28 Today's Date: 04/09/2022   History of present illness Pt is a 62 y/o female admitted secondary to excessive sleepiness and somnolence. Pt found to be mildly hypotensive and was given a fluid bolus with BP improving. Pt also found to have SpO2 at 89% with improvements to 98% on 2L of supplemental O2 via . PMH including but not limited to HTN, heart failure, DM, pulmonary embolism.   OT comments  Elizabeth Gutierrez was seen for OT tx this date. Pt received seated EOB with family at bedside. Pt continues to demonstrate limited cognition but appears at her functional baseline per family. Pt requires consistent cueing and redirection to task/topic at hand during ADL management this session. She is able to perform ADL tasks as described below (See ADL section for additional details regarding occupational performance). She continues to be functionally limited by decreased activity tolerance, generalized weakness, and decreased safety awareness. She requires close CGA during functional mobility with a RW and MOD A to perform hair washing while seated this date. Pt making good progress toward goals and continues to benefit from skilled OT services to maximize return to PLOF and minimize risk of future falls, injury, caregiver burden, and readmission. Will continue to follow POC. Discharge recommendation remains appropriate.     Recommendations for follow up therapy are one component of a multi-disciplinary discharge planning process, led by the attending physician.  Recommendations may be updated based on patient status, additional functional criteria and insurance authorization.    Follow Up Recommendations  Home health OT     Assistance Recommended at Discharge Frequent or constant Supervision/Assistance  Patient can return home with the following  A little help with walking and/or transfers;A little help with  bathing/dressing/bathroom;Assist for transportation;Help with stairs or ramp for entrance   Equipment Recommendations  None recommended by OT    Recommendations for Other Services      Precautions / Restrictions Precautions Precautions: Fall Restrictions Weight Bearing Restrictions: No       Mobility Bed Mobility Overal bed mobility: Modified Independent             General bed mobility comments: HOB elevated    Transfers Overall transfer level: Needs assistance Equipment used: Rolling walker (2 wheels) Transfers: Sit to/from Stand Sit to Stand: Min guard           General transfer comment: min guard for safety, steady with transitional movement; verbal cueing to back all the way up to the EOB prior to sitting back down     Balance Overall balance assessment: Needs assistance Sitting-balance support: No upper extremity supported Sitting balance-Leahy Scale: Good     Standing balance support: Single extremity supported, Reliant on assistive device for balance, During functional activity, No upper extremity supported Standing balance-Leahy Scale: Fair Standing balance comment: leaning on walker with no LOBs.  Cues for situational awareness during dynamic standing tasks                           ADL either performed or assessed with clinical judgement   ADL Overall ADL's : Needs assistance/impaired     Grooming: Sitting;Wash/dry face;Brushing hair;Minimal assistance;Cueing for sequencing;Cueing for safety   Upper Body Bathing: Sitting;Moderate assistance;Cueing for sequencing;Cueing for safety Upper Body Bathing Details (indicate cue type and reason): MOD A to perform hair washing with shampoo cap while seated EOB. Pt easily distracted t/o session and requires  consistent cueing for sequencing and to attend to task t/o session.                         Functional mobility during ADLs: Min guard;Rolling walker (2 wheels);Cueing for  safety;Cueing for sequencing      Extremity/Trunk Assessment              Vision Patient Visual Report: No change from baseline     Perception     Praxis      Cognition Arousal/Alertness: Awake/alert Behavior During Therapy: Restless, WFL for tasks assessed/performed (easily distracted) Overall Cognitive Status: History of cognitive impairments - at baseline Area of Impairment: Attention, Memory, Following commands, Safety/judgement, Awareness, Problem solving                   Current Attention Level: Sustained Memory: Decreased short-term memory Following Commands: Follows one step commands consistently, Follows one step commands with increased time Safety/Judgement: Decreased awareness of deficits, Decreased awareness of safety Awareness: Intellectual Problem Solving: Difficulty sequencing, Requires verbal cues, Requires tactile cues          Exercises Other Exercises Other Exercises: Pt/family educated on role of OT in acute setting, safe use of AE/DME for ADL management, importance of using AE during functional mobility, and falls prevention during ADL management.    Shoulder Instructions       General Comments      Pertinent Vitals/ Pain       Pain Assessment Pain Assessment: No/denies pain  Home Living                                          Prior Functioning/Environment              Frequency  Min 2X/week        Progress Toward Goals  OT Goals(current goals can now be found in the care plan section)  Progress towards OT goals: Progressing toward goals  Acute Rehab OT Goals Patient Stated Goal: to eat lunch OT Goal Formulation: With patient Time For Goal Achievement: 04/21/22 Potential to Achieve Goals: [place] Discharge plan remains appropriate;Frequency remains appropriate    Co-evaluation                 AM-PAC OT "6 Clicks" Daily Activity     Outcome Measure   Help from another person  eating meals?: None Help from another person taking care of personal grooming?: A Little Help from another person toileting, which includes using toliet, bedpan, or urinal?: A Little Help from another person bathing (including washing, rinsing, drying)?: A Little Help from another person to put on and taking off regular upper body clothing?: A Little Help from another person to put on and taking off regular lower body clothing?: A Little 6 Click Score: 19    End of Session Equipment Utilized During Treatment: Rolling walker (2 wheels)  OT Visit Diagnosis: Unsteadiness on feet (R26.81);Repeated falls (R29.6);Muscle weakness (generalized) (M62.81)   Activity Tolerance Patient tolerated treatment well   Patient Left in bed;with bed alarm set;with call bell/phone within reach;with family/visitor present   Nurse Communication Mobility status        Time: 1226-1300 OT Time Calculation (min): 34 min  Charges: OT General Charges $OT Visit: 1 Visit OT Treatments $Self Care/Home Management : 23-37 mins  Shara Blazing, M.S., OTR/L 04/09/22, 1:54  PM

## 2022-04-09 NOTE — Progress Notes (Signed)
Physical Therapy Treatment Patient Details Name: Elizabeth Gutierrez MRN: [id number] DOB: 03-24-1960 Today's Date: 04/09/2022   History of Present Illness Pt is a 62 y/o female admitted secondary to excessive sleepiness and somnolence. Pt found to be mildly hypotensive and was given a fluid bolus with BP improving. Pt also found to have SpO2 at 89% with improvements to 98% on 2L of supplemental O2 via [place]. PMH including but not limited to HTN, heart failure, DM, pulmonary embolism.    PT Comments    Pt making steady progress with functional mobility as indicated by her ability to ambulate further distance this session as compared to previous session. Pt would continue to benefit from skilled physical therapy services at this time while admitted and after d/c to address the below listed limitations in order to improve overall safety and independence with functional mobility.    Recommendations for follow up therapy are one component of a multi-disciplinary discharge planning process, led by the attending physician.  Recommendations may be updated based on patient status, additional functional criteria and insurance authorization.  Follow Up Recommendations  Home health PT     Assistance Recommended at Discharge Intermittent Supervision/Assistance  Patient can return home with the following A little help with bathing/dressing/bathroom;Assistance with cooking/housework;Assist for transportation   Equipment Recommendations  None recommended by PT    Recommendations for Other Services       Precautions / Restrictions Precautions Precautions: Fall Restrictions Weight Bearing Restrictions: No     Mobility  Bed Mobility Overal bed mobility: Modified Independent                  Transfers Overall transfer level: Needs assistance Equipment used: Rolling walker (2 wheels) Transfers: Sit to/from Stand Sit to Stand: Min guard           General transfer comment: min guard for  safety, steady with transitional movement; verbal cueing to back all the way up to the EOB prior to sitting back down    Ambulation/Gait Ambulation/Gait assistance: Min guard, Min assist Gait Distance (Feet): 100 Feet Assistive device: Rolling walker (2 wheels) Gait Pattern/deviations: Step-through pattern, Decreased stride length, Drifts right/left, Trunk flexed Gait velocity: decreased     General Gait Details: pt with slow, steady gait with consistent visual gaze towards the floor, only able to briefly correct with max cueing. pt also requiring max cueing and min A to correct her proximity to the RW and for directional guidance   Stairs             Wheelchair Mobility    Modified Rankin (Stroke Patients Only)       Balance Overall balance assessment: Needs assistance Sitting-balance support: No upper extremity supported Sitting balance-Leahy Scale: Good     Standing balance support: Bilateral upper extremity supported, Single extremity supported Standing balance-Leahy Scale: Poor                              Cognition Arousal/Alertness: Awake/alert Behavior During Therapy: Restless Overall Cognitive Status: History of cognitive impairments - at baseline Area of Impairment: Orientation, Attention, Memory, Following commands, Safety/judgement, Awareness, Problem solving                 Orientation Level: Disoriented to, Situation Current Attention Level: Selective Memory: Decreased recall of precautions Following Commands: Follows one step commands consistently, Follows one step commands with increased time Safety/Judgement: Decreased awareness of deficits, Decreased awareness of safety Awareness: Intellectual  Problem Solving: Difficulty sequencing, Requires verbal cues, Requires tactile cues          Exercises      General Comments        Pertinent Vitals/Pain Pain Assessment Pain Assessment: No/denies pain    Home Living                           Prior Function            PT Goals (current goals can now be found in the care plan section) Acute Rehab PT Goals PT Goal Formulation: With patient Time For Goal Achievement: 04/20/22 Potential to Achieve Goals: Good Progress towards PT goals: Progressing toward goals    Frequency    Min 2X/week      PT Plan Current plan remains appropriate    Co-evaluation              AM-PAC PT "6 Clicks" Mobility   Outcome Measure  Help needed turning from your back to your side while in a flat bed without using bedrails?: None Help needed moving from lying on your back to sitting on the side of a flat bed without using bedrails?: None Help needed moving to and from a bed to a chair (including a wheelchair)?: None Help needed standing up from a chair using your arms (e.g., wheelchair or bedside chair)?: None Help needed to walk in hospital room?: A Little Help needed climbing 3-5 steps with a railing? : A Little 6 Click Score: 22    End of Session   Activity Tolerance: Patient tolerated treatment well Patient left: in bed;with call bell/phone within reach;with bed alarm set Nurse Communication: Mobility status PT Visit Diagnosis: Unsteadiness on feet (R26.81);Muscle weakness (generalized) (M62.81)     Time: 5638-7564 PT Time Calculation (min) (ACUTE ONLY): 14 min  Charges:  $Gait Training: 8-22 mins                     Anastasio Champion, DPT  Acute Rehabilitation Services Office [place] 04/09/2022, 12:02 PM

## 2022-04-09 NOTE — TOC Progression Note (Addendum)
Transition of Care [facility]) - Progression Note    Patient Details  Name: Elizabeth Gutierrez MRN: [id number] Date of Birth: 1960-08-05  Transition of Care [facility]) CM/SW Contact  Valente David, RN Phone Number: 04/09/2022, 11:46 AM  Clinical Narrative:     Spoke with Lottie Rater, RN for group home, state she was at bedside yesterday for in-person assessment of needs.  Report patient is able to come back to the home, she will check to see if this is possible today if patient deemed medically ready (she is unsure if there will be staff to pick up patient today, she will keep this RNCM updated).  She will need new signed FL2 with updated discharge medications once decision on discharge is made.    TOC will continue to follow.   Update @ 1330:  Patient has discharge orders.  Per Butch Penny, there was a call out at group home today, they are unable to provide transportation.  Call placed to patient's mother, she is also unable to provide transportation due to broken ribs and inability to drive currently.  Butch Penny aware, agrees that patient will be safe to transport in taxi.  MD aware of completion of FL2 and need for signature to fax to Butch Penny at [phone].  Katina with [facility] also made aware that patient will be discharging today.    Expected Discharge Plan: [place] Barriers to Discharge: Continued Medical Work up  Expected Discharge Plan and Services   Discharge Planning Services: CM Consult Post Acute Care Choice: Resumption of Svcs/PTA Provider Living arrangements for the past 2 months: Group Home                 DME Arranged: N/A DME Agency: NA       HH Arranged: NA [place] Agency: NA         Social Determinants of Health (SDOH) Interventions [place]: No Food Insecurity (03/08/2022)  Housing: Low Risk  (03/09/2022)  Transportation Needs: No Transportation Needs (03/08/2022)  Utilities: Not At Risk (03/08/2022)  Tobacco Use: Medium Risk  (04/08/2022)    Readmission Risk Interventions     No data to display

## 2022-04-09 NOTE — NC FL2 (Signed)
[place] LEVEL OF CARE FORM     IDENTIFICATION  Patient Name: Elizabeth Gutierrez Birthdate: 03-31-1960 Sex: female Admission Date (Current Location): 04/06/2022  [facility] and [place] Number:  Engineering geologist and Address:  [facility], [address] [place], [place] [address]      Provider Number: [id number]  Attending Physician Name and Address:  Jennye Boroughs, MD  Relative Name and Phone Number:       Current Level of Care: Hospital Recommended Level of Care: Assisted Living Facility Prior Approval Number:    Date Approved/Denied:   PASRR Number:    Discharge Plan: Other (Comment) (ALF at [place])    Current Diagnoses: Patient Active Problem List   Diagnosis Date Noted   Acute hypoxic respiratory failure ([place]) 04/07/2022   Hypotension 17/40/8144   Toxic metabolic encephalopathy 81/85/6314   Somnolence 04/06/2022   Sepsis due to gram-negative UTI ([place]) 03/08/2022   Generalized weakness 03/08/2022   Anxiety and depression 03/08/2022   Essential hypertension 03/08/2022   Acute conjunctivitis 03/08/2022   Malnutrition of moderate degree 11/28/2019   Goals of care, counseling/discussion    Palliative care by specialist    DNR (do not resuscitate) discussion    Hypercalcemia    Tachycardia    Weakness    Acute kidney injury superimposed on CKD ([place])    COVID-19 11/22/2019   Bilateral pneumonia 09/28/2019   Shortness of breath 09/26/2019   Hyperlipidemia    Paranoid schizophrenia ([place])    Type 2 diabetes mellitus with hyperlipidemia ([place])    Hypothyroidism    Chronic kidney disease, stage 3a ([place])    Acute on chronic respiratory failure with hypoxia ([place])    Acute metabolic encephalopathy 97/04/6376   Altered mental state 08/17/2016   DDD (degenerative disc disease), lumbar 07/09/2015   Facet syndrome, lumbar 07/09/2015   Lumbar radiculopathy 07/09/2015   Sacroiliac joint dysfunction 07/09/2015   Spinal stenosis,  lumbar region, 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   Diabetic neuropathy ([place]) 07/09/2015   CAD in native artery 06/16/2015   Controlled type 2 diabetes mellitus without complication ([place]) 58/85/0277   Benign essential HTN 06/16/2015   Obstructive apnea 01/05/2015   Esophagitis, reflux 08/06/2014   Edema leg 05/14/2014   Chronic combined systolic (congestive) and diastolic (congestive) heart failure ([place]) 05/14/2014   TI (tricuspid incompetence) 05/14/2014   Chest pain 12/13/2013   Combined fat and carbohydrate induced hyperlipemia 10/09/2013   Breath shortness 10/09/2013    [place] RESPIRATION BLADDER Height & Weight     Self, Place, Situation, Time  Normal Incontinent Weight: 66.5 kg Height:  '5\' 8"'$  (172.7 cm)  BEHAVIORAL SYMPTOMS/MOOD NEUROLOGICAL BOWEL NUTRITION STATUS      Continent Diet  AMBULATORY STATUS COMMUNICATION OF NEEDS Skin   Independent Verbally Normal                       Personal Care Assistance Level of Assistance  Bathing, Dressing Bathing Assistance: Limited assistance   Dressing Assistance: Limited assistance     Functional Limitations Info             SPECIAL CARE FACTORS FREQUENCY                       Contractures Contractures Info: Not present    Additional Factors Info  Code Status, Allergies Code Status Info: Full Allergies Info: Bee Venom            Discharge  Medications: Medication List       STOP taking these medications     docusate sodium 100 MG capsule Commonly known as: COLACE    ibuprofen 600 MG tablet Commonly known as: ADVIL           TAKE these medications     acetaminophen 325 MG tablet Commonly known as: TYLENOL Take 650 mg by mouth every 4 (four) hours as needed for moderate pain, fever or headache.    apixaban 5 MG Tabs tablet Commonly known as: ELIQUIS Take 1 tablet (5 mg total) by mouth 2 (two) times daily. What changed:  how much to take how to take this when to take  this additional instructions    ascorbic acid 1000 MG tablet Commonly known as: VITAMIN C Take 1 tablet by mouth daily.    aspirin 81 MG tablet Take 81 mg by mouth daily.    atorvastatin 20 MG tablet Commonly known as: LIPITOR Take 20 mg by mouth daily.    Belsomra 15 MG Tabs Generic drug: Suvorexant Take 1 tablet by mouth at bedtime.    benztropine 0.5 MG tablet Commonly known as: COGENTIN Take 0.5 mg by mouth 2 (two) times daily.    carvedilol 3.125 MG tablet Commonly known as: COREG Take 3.125 mg by mouth 2 (two) times daily.    divalproex 125 MG DR tablet Commonly known as: DEPAKOTE Take 250 mg by mouth 2 (two) times daily.    donepezil 10 MG tablet Commonly known as: ARICEPT Take 1 tablet by mouth at bedtime.    fluPHENAZine decanoate 25 MG/ML injection Commonly known as: PROLIXIN Inject 31.25 mg into the muscle every 14 (fourteen) days.    fluticasone 50 MCG/ACT nasal spray Commonly known as: FLONASE Place 2 sprays into both nostrils daily.    furosemide 20 MG tablet Commonly known as: Lasix Take 1 tablet (20 mg total) by mouth daily as needed for fluid or edema.    gabapentin 100 MG capsule Commonly known as: NEURONTIN Take 200 mg by mouth 3 (three) times daily.    Jardiance 25 MG Tabs tablet Generic drug: empagliflozin Take 25 mg by mouth daily.    ketoconazole 2 % cream Commonly known as: NIZORAL Apply 1 application topically in the morning and at bedtime.    levothyroxine 25 MCG tablet Commonly known as: SYNTHROID Take 1 tablet (25 mcg total) by mouth daily before breakfast.    metFORMIN 500 MG 24 hr tablet Commonly known as: GLUCOPHAGE-XR Take 500 mg by mouth daily with breakfast.    nystatin cream Commonly known as: MYCOSTATIN Apply 1 application topically 2 (two) times daily.    omeprazole 20 MG capsule Commonly known as: PRILOSEC Take 20 mg by mouth 2 (two) times daily.    polyethylene glycol 17 g packet Commonly known as:  MIRALAX / GLYCOLAX Take 17 g by mouth daily.    QUEtiapine 200 MG tablet Commonly known as: SEROQUEL Take 600 mg by mouth at bedtime.    QUEtiapine 100 MG tablet Commonly known as: SEROQUEL Take 100 mg by mouth daily. At noon    RA No Flush Niacin 500 MG tablet Generic drug: niacin Take 1,000 mg by mouth 2 (two) times daily.    senna 8.6 MG tablet Commonly known as: SENOKOT Take 1 tablet by mouth daily.    sertraline 100 MG tablet Commonly known as: ZOLOFT Take 100 mg by mouth in the morning.    Trulicity 6.81 EX/5.1ZG Sopn Generic drug: Dulaglutide Inject 0.75 mg  into the skin once a week.    zinc sulfate 220 (50 Zn) MG capsule Take 220 mg by mouth daily.       Relevant Imaging Results:  Relevant Lab Results:   Additional Information SS #: [id number]  Valente David, RN

## 2022-04-11 LAB — CULTURE, BLOOD (SINGLE): Culture: NO GROWTH

## 2022-04-14 DIAGNOSIS — R937 Abnormal findings on diagnostic imaging of other parts of musculoskeletal system: Secondary | ICD-10-CM | POA: Insufficient documentation

## 2022-04-22 ENCOUNTER — Inpatient Hospital Stay: Admission: RE | Admit: 2022-04-22 | Payer: Medicare Other | Source: Ambulatory Visit

## 2022-05-02 ENCOUNTER — Encounter: Payer: Self-pay | Admitting: Family Medicine

## 2022-05-09 ENCOUNTER — Telehealth: Payer: Self-pay | Admitting: *Deleted

## 2022-05-09 NOTE — Telephone Encounter (Signed)
Called the assisted  living and spoke to [place]. He know about the appt. And they know the directions and I went over the the masks are mandatory, we have masks at front of the building when you come in and can have 1 person over 16 to come with her. They are agreeable and will be there tom.

## 2022-05-10 ENCOUNTER — Encounter: Payer: Self-pay | Admitting: Oncology

## 2022-05-10 ENCOUNTER — Inpatient Hospital Stay: Payer: Medicare Other

## 2022-05-10 ENCOUNTER — Inpatient Hospital Stay: Payer: Medicare Other | Attending: Oncology | Admitting: Oncology

## 2022-05-10 ENCOUNTER — Ambulatory Visit
Admission: RE | Admit: 2022-05-10 | Discharge: 2022-05-10 | Disposition: A | Discharge status: Home / Self Care | Payer: Medicare Other | Source: Ambulatory Visit | Attending: Oncology | Admitting: Oncology

## 2022-05-10 ENCOUNTER — Other Ambulatory Visit: Payer: Self-pay | Admitting: *Deleted

## 2022-05-10 VITALS — BP 99/66 | HR 91 | Temp 99.0°F | Resp 18 | Ht 68.0 in | Wt 156.0 lb

## 2022-05-10 DIAGNOSIS — I130 Hypertensive heart and chronic kidney disease with heart failure and stage 1 through stage 4 chronic kidney disease, or unspecified chronic kidney disease: Secondary | ICD-10-CM | POA: Diagnosis not present

## 2022-05-10 DIAGNOSIS — M899 Disorder of bone, unspecified: Secondary | ICD-10-CM | POA: Diagnosis present

## 2022-05-10 DIAGNOSIS — F419 Anxiety disorder, unspecified: Secondary | ICD-10-CM | POA: Insufficient documentation

## 2022-05-10 DIAGNOSIS — N1831 Chronic kidney disease, stage 3a: Secondary | ICD-10-CM | POA: Diagnosis not present

## 2022-05-10 DIAGNOSIS — R9389 Abnormal findings on diagnostic imaging of other specified body structures: Secondary | ICD-10-CM

## 2022-05-10 DIAGNOSIS — E039 Hypothyroidism, unspecified: Secondary | ICD-10-CM | POA: Insufficient documentation

## 2022-05-10 DIAGNOSIS — E1122 Type 2 diabetes mellitus with diabetic chronic kidney disease: Secondary | ICD-10-CM | POA: Insufficient documentation

## 2022-05-10 DIAGNOSIS — I2510 Atherosclerotic heart disease of native coronary artery without angina pectoris: Secondary | ICD-10-CM | POA: Diagnosis not present

## 2022-05-10 DIAGNOSIS — F32A Depression, unspecified: Secondary | ICD-10-CM | POA: Diagnosis not present

## 2022-05-10 DIAGNOSIS — I252 Old myocardial infarction: Secondary | ICD-10-CM | POA: Diagnosis not present

## 2022-05-10 DIAGNOSIS — Z79899 Other long term (current) drug therapy: Secondary | ICD-10-CM | POA: Insufficient documentation

## 2022-05-10 DIAGNOSIS — Z87891 Personal history of nicotine dependence: Secondary | ICD-10-CM | POA: Insufficient documentation

## 2022-05-10 LAB — CBC WITH DIFFERENTIAL/PLATELET
Abs Immature Granulocytes: 0.01 10*3/uL (ref 0.00–0.07)
Basophils Absolute: 0 10*3/uL (ref 0.0–0.1)
Basophils Relative: 0 %
Eosinophils Absolute: 0 10*3/uL (ref 0.0–0.5)
Eosinophils Relative: 1 %
HCT: 34.9 % — ABNORMAL LOW (ref 36.0–46.0)
Hemoglobin: 10.2 g/dL — ABNORMAL LOW (ref 12.0–15.0)
Immature Granulocytes: 0 %
Lymphocytes Relative: 28 %
Lymphs Abs: 1.4 10*3/uL (ref 0.7–4.0)
MCH: 26.3 pg (ref 26.0–34.0)
MCHC: 29.2 g/dL — ABNORMAL LOW (ref 30.0–36.0)
MCV: 89.9 fL (ref 80.0–100.0)
Monocytes Absolute: 0.7 10*3/uL (ref 0.1–1.0)
Monocytes Relative: 14 %
Neutro Abs: 2.7 10*3/uL (ref 1.7–7.7)
Neutrophils Relative %: 57 %
Platelets: 244 10*3/uL (ref 150–400)
RBC: 3.88 MIL/uL (ref 3.87–5.11)
RDW: 18.5 % — ABNORMAL HIGH (ref 11.5–15.5)
WBC: 4.8 10*3/uL (ref 4.0–10.5)
nRBC: 0 % (ref 0.0–0.2)

## 2022-05-10 LAB — COMPREHENSIVE METABOLIC PANEL
ALT: 9 U/L (ref 0–44)
AST: 19 U/L (ref 15–41)
Albumin: 3.3 g/dL — ABNORMAL LOW (ref 3.5–5.0)
Alkaline Phosphatase: 124 U/L (ref 38–126)
Anion gap: 8 (ref 5–15)
BUN: 21 mg/dL (ref 8–23)
CO2: 23 mmol/L (ref 22–32)
Calcium: 9.2 mg/dL (ref 8.9–10.3)
Chloride: 106 mmol/L (ref 98–111)
Creatinine, Ser: 1.26 mg/dL — ABNORMAL HIGH (ref 0.44–1.00)
GFR, Estimated: 49 mL/min — ABNORMAL LOW (ref >60)
Glucose, Bld: 132 mg/dL — ABNORMAL HIGH (ref 70–99)
Potassium: 4.8 mmol/L (ref 3.5–5.1)
Sodium: 137 mmol/L (ref 135–145)
Total Bilirubin: 0.4 mg/dL (ref 0.3–1.2)
Total Protein: 7.1 g/dL (ref 6.5–8.1)

## 2022-05-10 NOTE — Progress Notes (Signed)
Hematology/Oncology Consult note [facility] [phone] [phone]  Patient Care Team: Denton Lank, MD as PCP - General (Family Medicine)   Name of the patient: Elizabeth Gutierrez  [id number]  1960-07-29    Reason for referral-abnormal CT chest/bone lesions   Referring physician-Dr. Denton Lank  Date of visit: 05/10/22   History of presenting illness-patient is a 62 year old female with past medical history significant for hypertension, type 2 diabetes, paranoid schizophrenia who has been a resident of group home for the last 26 years.She went to the ER in January 2024 for symptoms of shortness of breath and had a CT angio chest done which did not show PE but incidentally showed lucent lesions throughout the skeletal structures concerning for possible multiple myeloma.  Myeloma panel done outside did not show any evidence of M protein.  She was noted to have a mild anemia with a hemoglobin of 11.8.  Serum creatinine mildly elevated at 1.6.  Total protein normal.  Calcium normal at 9.9.  ECOG PS- 4  Pain scale- 3   Review of systems- Review of Systems  Unable to perform ROS: Psychiatric disorder  Constitutional:  Negative for chills, fever, malaise/fatigue and weight loss.  HENT:  Negative for congestion, ear discharge and nosebleeds.   Eyes:  Negative for blurred vision.  Respiratory:  Negative for cough, hemoptysis, sputum production, shortness of breath and wheezing.   Cardiovascular:  Negative for chest pain, palpitations, orthopnea and claudication.  Gastrointestinal:  Negative for abdominal pain, blood in stool, constipation, diarrhea, heartburn, melena, nausea and vomiting.  Genitourinary:  Negative for dysuria, flank pain, frequency, hematuria and urgency.  Musculoskeletal:  Negative for back pain, joint pain and myalgias.  Skin:  Negative for rash.  Neurological:  Negative for dizziness, tingling, focal weakness, seizures, weakness  and headaches.  Endo/Heme/Allergies:  Does not bruise/bleed easily.  Psychiatric/Behavioral:  Negative for depression and suicidal ideas. The patient does not have insomnia.     Allergies  Allergen Reactions   Bee Venom     Patient Active Problem List   Diagnosis Date Noted   Acute hypoxic respiratory failure ([place]) 04/07/2022   Hypotension 0000000   Toxic metabolic encephalopathy 0000000   Somnolence 04/06/2022   Sepsis due to gram-negative UTI ([place]) 03/08/2022   Generalized weakness 03/08/2022   Anxiety and depression 03/08/2022   Essential hypertension 03/08/2022   Acute conjunctivitis 03/08/2022   Malnutrition of moderate degree 11/28/2019   Goals of care, counseling/discussion    Palliative care by specialist    DNR (do not resuscitate) discussion    Hypercalcemia    Tachycardia    Weakness    Acute kidney injury superimposed on CKD ([place])    COVID-19 11/22/2019   Bilateral pneumonia 09/28/2019   Shortness of breath 09/26/2019   Hyperlipidemia    Paranoid schizophrenia (HCC)    Type 2 diabetes mellitus with hyperlipidemia ([place])    Hypothyroidism    Chronic kidney disease, stage 3a ([place])    Acute on chronic respiratory failure with hypoxia ([place])    Acute metabolic encephalopathy [id number]   Altered mental state 08/17/2016   DDD (degenerative disc disease), lumbar 07/09/2015   Facet syndrome, lumbar 07/09/2015   Lumbar radiculopathy 07/09/2015   Sacroiliac joint dysfunction 07/09/2015   Spinal stenosis, lumbar region, 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   Diabetic neuropathy ([place]) 07/09/2015   CAD in native artery 06/16/2015   Controlled type 2 diabetes mellitus without complication ([place]) 0000000   Benign essential HTN 06/16/2015  Obstructive apnea 01/05/2015   Esophagitis, reflux 08/06/2014   Edema leg 05/14/2014   Chronic combined systolic (congestive) and diastolic (congestive) heart failure ([place]) 05/14/2014   TI (tricuspid incompetence)  05/14/2014   Chest pain 12/13/2013   Combined fat and carbohydrate induced hyperlipemia 10/09/2013   Breath shortness 10/09/2013     Past Medical History:  Diagnosis Date   CHF (congestive heart failure) ([place])    Coronary artery disease    Diabetes ([place])    Heart attack ([place])    Hyperlipidemia    Hypertension    MR (mental retardation)    Mild   Paranoid schizophrenia ([place])    Thyroid disease      Past Surgical History:  Procedure Laterality Date   FOOT SURGERY Right     Social History   Socioeconomic History   Marital status: Single    Spouse name: Not on file   Number of children: Not on file   Years of education: Not on file   Highest education level: Not on file  Occupational History   Not on file  Tobacco Use   Smoking status: Former    Types: Cigarettes    Quit date: 07/08/2013    Years since quitting: 8.8   Smokeless tobacco: Former  Substance and Sexual Activity   Alcohol use: No    Alcohol/week: 0.0 standard drinks of alcohol   Drug use: No   Sexual activity: Not on file  Other Topics Concern   Not on file  Social History Narrative   Not on file   Social Determinants of Health   Financial Resource Strain: Not on file  Food Insecurity: No Food Insecurity (03/08/2022)   Hunger Vital Sign    Worried About Running Out of Food in the Last Year: Never true    Ran Out of Food in the Last Year: Never true  Transportation Needs: No Transportation Needs (03/08/2022)   PRAPARE - Hydrologist (Medical): No    Lack of Transportation (Non-Medical): No  Physical Activity: Not on file  Stress: Not on file  Social Connections: Not on file  Intimate Partner Violence: Not At Risk (03/08/2022)   Humiliation, Afraid, Rape, and Kick questionnaire    Fear of Current or Ex-Partner: No    Emotionally Abused: No    Physically Abused: No    Sexually Abused: No     Family History  Problem Relation Age of Onset   Alcohol abuse Father     Breast cancer Neg Hx      Current Outpatient Medications:    acetaminophen (TYLENOL) 325 MG tablet, Take 650 mg by mouth every 4 (four) hours as needed for moderate pain, fever or headache. , Disp: , Rfl:    apixaban (ELIQUIS) 5 MG TABS tablet, Take 1 tablet (5 mg total) by mouth 2 (two) times daily., Disp: , Rfl:    ascorbic acid (VITAMIN C) 1000 MG tablet, Take 1 tablet by mouth daily., Disp: , Rfl:    aspirin 81 MG tablet, Take 81 mg by mouth daily., Disp: , Rfl:    atorvastatin (LIPITOR) 20 MG tablet, Take 20 mg by mouth daily., Disp: , Rfl:    BELSOMRA 15 MG TABS, Take 1 tablet by mouth at bedtime., Disp: , Rfl:    benztropine (COGENTIN) 0.5 MG tablet, Take 0.5 mg by mouth 2 (two) times daily., Disp: , Rfl:    carvedilol (COREG) 3.125 MG tablet, Take 3.125 mg by mouth 2 (two) times daily.,  Disp: , Rfl:    divalproex (DEPAKOTE) 125 MG DR tablet, Take 250 mg by mouth 2 (two) times daily., Disp: , Rfl:    donepezil (ARICEPT) 10 MG tablet, Take 1 tablet by mouth at bedtime., Disp: , Rfl:    Dulaglutide (TRULICITY) A999333 0000000 SOPN, Inject 0.75 mg into the skin once a week., Disp: , Rfl:    fluPHENAZine decanoate (PROLIXIN) 25 MG/ML injection, Inject 31.25 mg into the muscle every 14 (fourteen) days. , Disp: , Rfl:    fluticasone (FLONASE) 50 MCG/ACT nasal spray, Place 2 sprays into both nostrils daily. , Disp: , Rfl:    gabapentin (NEURONTIN) 100 MG capsule, Take 200 mg by mouth 3 (three) times daily., Disp: , Rfl:    JARDIANCE 25 MG TABS tablet, Take 25 mg by mouth daily., Disp: , Rfl:    ketoconazole (NIZORAL) 2 % cream, Apply 1 application topically in the morning and at bedtime., Disp: , Rfl:    levothyroxine (SYNTHROID) 25 MCG tablet, Take 1 tablet (25 mcg total) by mouth daily before breakfast., Disp: , Rfl:    metFORMIN (GLUCOPHAGE-XR) 500 MG 24 hr tablet, Take 500 mg by mouth daily with breakfast., Disp: , Rfl:    nystatin cream (MYCOSTATIN), Apply 1 application topically 2  (two) times daily., Disp: 30 g, Rfl: 0   omeprazole (PRILOSEC) 20 MG capsule, Take 20 mg by mouth 2 (two) times daily. , Disp: , Rfl:    polyethylene glycol (MIRALAX / GLYCOLAX) 17 g packet, Take 17 g by mouth daily. , Disp: , Rfl:    QUEtiapine (SEROQUEL) 100 MG tablet, Take 100 mg by mouth daily. At noon, Disp: , Rfl:    QUEtiapine (SEROQUEL) 200 MG tablet, Take 600 mg by mouth at bedtime., Disp: , Rfl:    RA NO FLUSH NIACIN 500 MG tablet, Take 1,000 mg by mouth 2 (two) times daily., Disp: , Rfl:    senna (SENOKOT) 8.6 MG tablet, Take 1 tablet by mouth daily., Disp: , Rfl:    sertraline (ZOLOFT) 100 MG tablet, Take 100 mg by mouth in the morning. , Disp: , Rfl:    zinc sulfate 220 (50 Zn) MG capsule, Take 220 mg by mouth daily., Disp: , Rfl:    furosemide (LASIX) 20 MG tablet, Take 1 tablet (20 mg total) by mouth daily as needed for fluid or edema., Disp: 30 tablet, Rfl: 0   Physical exam:  Vitals:   05/10/22 1100  BP: 99/66  Pulse: 91  Resp: 18  Temp: 99 F (37.2 C)  TempSrc: Tympanic  SpO2: 100%  Weight: 156 lb (70.8 kg)  Height: 5' 8"$  (1.727 m)   Physical Exam Constitutional:      Comments: Sitting in a wheelchair.  Appears in no acute distress  Cardiovascular:     Rate and Rhythm: Normal rate and regular rhythm.     Heart sounds: Normal heart sounds.  Pulmonary:     Effort: Pulmonary effort is normal.     Breath sounds: Normal breath sounds.  Abdominal:     General: Bowel sounds are normal.     Palpations: Abdomen is soft.  Skin:    General: Skin is warm and dry.  Neurological:     Mental Status: She is alert and oriented to person, place, and time.           Latest Ref Rng & Units 05/10/2022   11:53 AM  CMP  Glucose 70 - 99 mg/dL 132   BUN 8 - 23  mg/dL 21   Creatinine 0.44 - 1.00 mg/dL 1.26   Sodium 135 - 145 mmol/L 137   Potassium 3.5 - 5.1 mmol/L 4.8   Chloride 98 - 111 mmol/L 106   CO2 22 - 32 mmol/L 23   Calcium 8.9 - 10.3 mg/dL 9.2   Total Protein  6.5 - 8.1 g/dL 7.1   Total Bilirubin 0.3 - 1.2 mg/dL 0.4   Alkaline Phos 38 - 126 U/L 124   AST 15 - 41 U/L 19   ALT 0 - 44 U/L 9       Latest Ref Rng & Units 05/10/2022   11:53 AM  CBC  WBC 4.0 - 10.5 K/uL 4.8   Hemoglobin 12.0 - 15.0 g/dL 10.2   Hematocrit 36.0 - 46.0 % 34.9   Platelets 150 - 400 K/uL 244      Assessment and plan- Patient is a 62 y.o. female referred for abnormal CT chest findings  CT chest incidentally showed lucent lesions in the skeletal structures concerning for multiple myeloma.  Myeloma panel did not show any evidence of M protein.  Her hemoglobin is nearly normal at 11.8.  She has some chronic kidney disease with a creatinine of 1.6.  Serum calcium and total protein is normal.  I would like to get a CBC with differential CMP myeloma panel serum free light chains and random urine protein electrophoresis today as well as a bone survey.  I will see her back in 3 weeks time to discuss the results with her legal guardian Blanch Media who is her mom.  Bone lesions can also be seen in other metastatic malignancies most commonly breast cancer.  On breast exam today I could not palpate any masses.  She has an upcoming mammogram in early March as well.  Based on the results of her lab work I will decide what further workup is needed   Thank you for this kind referral and the opportunity to participate in the care of this patient   Visit Diagnosis 1. Abnormal CT of the chest     Dr. Randa Evens, MD, MPH [facility] at [facility] [id number] 05/10/2022

## 2022-05-11 LAB — KAPPA/LAMBDA LIGHT CHAINS
Kappa free light chain: 120.5 mg/L — ABNORMAL HIGH (ref 3.3–19.4)
Kappa, lambda light chain ratio: 1.93 — ABNORMAL HIGH (ref 0.26–1.65)
Lambda free light chains: 62.5 mg/L — ABNORMAL HIGH (ref 5.7–26.3)

## 2022-05-16 LAB — MULTIPLE MYELOMA PANEL, SERUM
Albumin SerPl Elph-Mcnc: 3.5 g/dL (ref 2.9–4.4)
Albumin/Glob SerPl: 1.2 (ref 0.7–1.7)
Alpha 1: 0.2 g/dL (ref 0.0–0.4)
Alpha2 Glob SerPl Elph-Mcnc: 0.6 g/dL (ref 0.4–1.0)
B-Globulin SerPl Elph-Mcnc: 1 g/dL (ref 0.7–1.3)
Gamma Glob SerPl Elph-Mcnc: 1.2 g/dL (ref 0.4–1.8)
Globulin, Total: 3 g/dL (ref 2.2–3.9)
IgA: 485 mg/dL — ABNORMAL HIGH (ref 87–352)
IgG (Immunoglobin G), Serum: 1348 mg/dL (ref 586–1602)
IgM (Immunoglobulin M), Srm: 72 mg/dL (ref 26–217)
Total Protein ELP: 6.5 g/dL (ref 6.0–8.5)

## 2022-05-18 ENCOUNTER — Other Ambulatory Visit: Payer: Self-pay

## 2022-05-18 DIAGNOSIS — R9389 Abnormal findings on diagnostic imaging of other specified body structures: Secondary | ICD-10-CM

## 2022-05-20 ENCOUNTER — Ambulatory Visit
Admission: RE | Admit: 2022-05-20 | Discharge: 2022-05-20 | Disposition: A | Discharge status: Home / Self Care | Payer: Medicare Other | Source: Ambulatory Visit | Attending: Family Medicine | Admitting: Family Medicine

## 2022-05-20 DIAGNOSIS — Z1231 Encounter for screening mammogram for malignant neoplasm of breast: Secondary | ICD-10-CM | POA: Insufficient documentation

## 2022-05-20 LAB — PROTEIN ELECTRO, RANDOM URINE
Albumin ELP, Urine: 12.7 %
Alpha-1-Globulin, U: 2.3 %
Alpha-2-Globulin, U: 7.9 %
Beta Globulin, U: 49.1 %
Gamma Globulin, U: 28 %
Total Protein, Urine: 19.2 mg/dL

## 2022-06-08 ENCOUNTER — Encounter: Payer: Self-pay | Admitting: Oncology

## 2022-06-08 ENCOUNTER — Inpatient Hospital Stay: Payer: Medicare Other | Attending: Oncology | Admitting: Oncology

## 2022-06-08 VITALS — BP 114/73 | HR 81 | Temp 96.2°F | Resp 17 | Wt 145.6 lb

## 2022-06-08 DIAGNOSIS — Z87891 Personal history of nicotine dependence: Secondary | ICD-10-CM | POA: Diagnosis not present

## 2022-06-08 DIAGNOSIS — M899 Disorder of bone, unspecified: Secondary | ICD-10-CM | POA: Diagnosis present

## 2022-06-08 DIAGNOSIS — R9389 Abnormal findings on diagnostic imaging of other specified body structures: Secondary | ICD-10-CM

## 2022-06-08 NOTE — Progress Notes (Signed)
Hematology/Oncology Consult note [facility]  [phone] [phone]  Patient Care Team: Denton Lank, MD as PCP - General (Family Medicine)   Name of the patient: Elizabeth Gutierrez  [id number]  03-Jan-1961   Date of visit: 06/08/22  Diagnosis-lucent bone lesions incidentally noted on CT chest of unclear etiology  Chief complaint/ Reason for visit-discuss recent diagnostic workup  Heme/Onc history: patient is a 61 year old female with past medical history significant for hypertension, type 2 diabetes, paranoid schizophrenia who has been a resident of group home for the last 26 years.She went to the ER in January 2024 for symptoms of shortness of breath and had a CT angio chest done which did not show PE but incidentally showed lucent lesions throughout the skeletal structures concerning for possible multiple myeloma.  Myeloma panel done outside did not show any evidence of M protein.  She was noted to have a mild anemia with a hemoglobin of 11.8.  Serum creatinine mildly elevated at 1.6.  Total protein normal.  Calcium normal at 9.9.   Bone survey did not show any lytic bone lesions mammogram bilateral was negative.  Workup for multiple myeloma did not show any evidence of M protein.  Both kappa and lambda light chains were elevated and free light chain ratio was mildly abnormal at 1.9.  Interval history-no acute issues since her last visit.  ECOG PS- 2 Pain scale-unable to obtain   Review of systems- Review of Systems  Unable to perform ROS: Psychiatric disorder      Allergies  Allergen Reactions   Bee Venom Anaphylaxis     Past Medical History:  Diagnosis Date   CHF (congestive heart failure) (HCC)    Coronary artery disease    Diabetes ([place])    Heart attack ([place])    Hyperlipidemia    Hypertension    MR (mental retardation)    Mild   Paranoid schizophrenia ([place])    Thyroid disease      Past Surgical History:  Procedure  Laterality Date   FOOT SURGERY Right     Social History   Socioeconomic History   Marital status: Single    Spouse name: Not on file   Number of children: Not on file   Years of education: Not on file   Highest education level: Not on file  Occupational History   Not on file  Tobacco Use   Smoking status: Former    Types: Cigarettes    Quit date: 07/08/2013    Years since quitting: 8.9   Smokeless tobacco: Former  Substance and Sexual Activity   Alcohol use: No    Alcohol/week: 0.0 standard drinks of alcohol   Drug use: No   Sexual activity: Not on file  Other Topics Concern   Not on file  Social History Narrative   Not on file   Social Determinants of Health   Financial Resource Strain: Not on file  Food Insecurity: No Food Insecurity (03/08/2022)   Hunger Vital Sign    Worried About Running Out of Food in the Last Year: Never true    Ran Out of Food in the Last Year: Never true  Transportation Needs: No Transportation Needs (03/08/2022)   PRAPARE - Hydrologist (Medical): No    Lack of Transportation (Non-Medical): No  Physical Activity: Not on file  Stress: Not on file  Social Connections: Not on file  Intimate Partner Violence: Not At Risk (03/08/2022)   Humiliation,  Afraid, Rape, and Kick questionnaire    Fear of Current or Ex-Partner: No    Emotionally Abused: No    Physically Abused: No    Sexually Abused: No    Family History  Problem Relation Age of Onset   Alcohol abuse Father    Breast cancer Neg Hx      Current Outpatient Medications:    acetaminophen (TYLENOL) 325 MG tablet, Take 650 mg by mouth every 4 (four) hours as needed for moderate pain, fever or headache. , Disp: , Rfl:    apixaban (ELIQUIS) 5 MG TABS tablet, Take 1 tablet (5 mg total) by mouth 2 (two) times daily., Disp: , Rfl:    ascorbic acid (VITAMIN C) 1000 MG tablet, Take 1 tablet by mouth daily., Disp: , Rfl:    aspirin 81 MG tablet, Take 81 mg by  mouth daily., Disp: , Rfl:    atorvastatin (LIPITOR) 20 MG tablet, Take 20 mg by mouth daily., Disp: , Rfl:    BELSOMRA 15 MG TABS, Take 1 tablet by mouth at bedtime., Disp: , Rfl:    benztropine (COGENTIN) 0.5 MG tablet, Take 0.5 mg by mouth 2 (two) times daily., Disp: , Rfl:    carvedilol (COREG) 3.125 MG tablet, Take 3.125 mg by mouth 2 (two) times daily., Disp: , Rfl:    divalproex (DEPAKOTE) 125 MG DR tablet, Take 250 mg by mouth 2 (two) times daily., Disp: , Rfl:    donepezil (ARICEPT) 10 MG tablet, Take 1 tablet by mouth at bedtime., Disp: , Rfl:    Dulaglutide (TRULICITY) A999333 0000000 SOPN, Inject 0.75 mg into the skin once a week., Disp: , Rfl:    fluPHENAZine decanoate (PROLIXIN) 25 MG/ML injection, Inject 31.25 mg into the muscle every 14 (fourteen) days. , Disp: , Rfl:    fluticasone (FLONASE) 50 MCG/ACT nasal spray, Place 2 sprays into both nostrils daily. , Disp: , Rfl:    furosemide (LASIX) 20 MG tablet, Take 1 tablet (20 mg total) by mouth daily as needed for fluid or edema., Disp: 30 tablet, Rfl: 0   gabapentin (NEURONTIN) 100 MG capsule, Take 200 mg by mouth 3 (three) times daily., Disp: , Rfl:    JARDIANCE 25 MG TABS tablet, Take 25 mg by mouth daily., Disp: , Rfl:    ketoconazole (NIZORAL) 2 % cream, Apply 1 application topically in the morning and at bedtime., Disp: , Rfl:    levothyroxine (SYNTHROID) 25 MCG tablet, Take 1 tablet (25 mcg total) by mouth daily before breakfast., Disp: , Rfl:    metFORMIN (GLUCOPHAGE-XR) 500 MG 24 hr tablet, Take 500 mg by mouth daily with breakfast., Disp: , Rfl:    nystatin cream (MYCOSTATIN), Apply 1 application topically 2 (two) times daily., Disp: 30 g, Rfl: 0   omeprazole (PRILOSEC) 20 MG capsule, Take 20 mg by mouth 2 (two) times daily. , Disp: , Rfl:    polyethylene glycol (MIRALAX / GLYCOLAX) 17 g packet, Take 17 g by mouth daily. , Disp: , Rfl:    QUEtiapine (SEROQUEL) 100 MG tablet, Take 100 mg by mouth daily. At noon, Disp: , Rfl:     QUEtiapine (SEROQUEL) 200 MG tablet, Take 600 mg by mouth at bedtime., Disp: , Rfl:    RA NO FLUSH NIACIN 500 MG tablet, Take 1,000 mg by mouth 2 (two) times daily., Disp: , Rfl:    senna (SENOKOT) 8.6 MG tablet, Take 1 tablet by mouth daily., Disp: , Rfl:    sertraline (ZOLOFT) 100 MG  tablet, Take 100 mg by mouth in the morning. , Disp: , Rfl:    zinc sulfate 220 (50 Zn) MG capsule, Take 220 mg by mouth daily., Disp: , Rfl:   Physical exam:  Vitals:   06/08/22 1109  BP: 114/73  Pulse: 81  Resp: 17  Temp: (!) 96.2 F (35.7 C)  TempSrc: Tympanic  SpO2: 100%  Weight: 145 lb 9.6 oz (66 kg)   Physical Exam Constitutional:      Comments: Ambulates with a walker  Cardiovascular:     Rate and Rhythm: Normal rate and regular rhythm.     Heart sounds: Normal heart sounds.  Pulmonary:     Effort: Pulmonary effort is normal.     Breath sounds: Normal breath sounds.  Abdominal:     General: Bowel sounds are normal.     Palpations: Abdomen is soft.  Skin:    General: Skin is warm and dry.  Neurological:     Mental Status: She is alert.         Latest Ref Rng & Units 05/10/2022   11:53 AM  CMP  Glucose 70 - 99 mg/dL 132   BUN 8 - 23 mg/dL 21   Creatinine 0.44 - 1.00 mg/dL 1.26   Sodium 135 - 145 mmol/L 137   Potassium 3.5 - 5.1 mmol/L 4.8   Chloride 98 - 111 mmol/L 106   CO2 22 - 32 mmol/L 23   Calcium 8.9 - 10.3 mg/dL 9.2   Total Protein 6.5 - 8.1 g/dL 7.1   Total Bilirubin 0.3 - 1.2 mg/dL 0.4   Alkaline Phos 38 - 126 U/L 124   AST 15 - 41 U/L 19   ALT 0 - 44 U/L 9       Latest Ref Rng & Units 05/10/2022   11:53 AM  CBC  WBC 4.0 - 10.5 K/uL 4.8   Hemoglobin 12.0 - 15.0 g/dL 10.2   Hematocrit 36.0 - 46.0 % 34.9   Platelets 150 - 400 K/uL 244     No images are attached to the encounter.  MM 3D SCREEN BREAST BILATERAL  Result Date: 05/24/2022 CLINICAL DATA:  Screening. EXAM: DIGITAL SCREENING BILATERAL MAMMOGRAM WITH TOMOSYNTHESIS AND CAD TECHNIQUE: Bilateral  screening digital craniocaudal and mediolateral oblique mammograms were obtained. Bilateral screening digital breast tomosynthesis was performed. The images were evaluated with computer-aided detection. COMPARISON:  Previous exam(s). ACR Breast Density Category b: There are scattered areas of fibroglandular density. FINDINGS: There are no findings suspicious for malignancy. IMPRESSION: No mammographic evidence of malignancy. A result letter of this screening mammogram will be mailed directly to the patient. RECOMMENDATION: Screening mammogram in one year. (Code:SM-B-01Y) BI-RADS CATEGORY  1: Negative. Electronically Signed   By: Abelardo Diesel M.D.   On: 05/24/2022 11:03   DG Bone Survey Met  Result Date: 05/12/2022 CLINICAL DATA:  Lytic lesion EXAM: METASTATIC BONE SURVEY COMPARISON:  None Available. FINDINGS: No focal lytic or blastic bone lesions are identified. Mild cardiomegaly.  Vascular calcifications noted. IMPRESSION: 1. No focal lytic or blastic bone lesions. Electronically Signed   By: Fidela Salisbury M.D.   On: 05/12/2022 17:42     Assessment and plan- Patient is a 62 y.o. female referred for incidentally noted bone lesions on CT chest  Patient was found to have lucent bone lesions that were incidentally noted on her CT chest in January 2024 concerning for possible multiple myeloma.  She had a bone survey subsequently which did not show any evidence of active  bone lesions.  Her mammogram is also negative therefore making metastatic breast cancer unlikely.  Myeloma workup did not show any evidence of M protein.  Both kappa and lambda light chains were elevated with a mildly elevated free light chain ratio of 1.9 which is likely secondary to her CKD.  I will discuss her CT chest findings at tumor board next week as it could not be discussed previously during the conference.  I do not think that these lesions are amenable to biopsy at this time and I am inclined to monitor this conservatively with a  repeat scan in 3 months.  Follow-up with me to be decided based on tumor board consensus   Visit Diagnosis 1. Abnormal CT of the chest      Dr. Randa Evens, MD, MPH [facility] at [facility] [id number] 06/08/2022 1:06 PM

## 2022-06-16 ENCOUNTER — Other Ambulatory Visit: Payer: Medicare Other

## 2022-07-15 ENCOUNTER — Emergency Department: Payer: Medicare Other

## 2022-07-15 ENCOUNTER — Other Ambulatory Visit: Payer: Self-pay

## 2022-07-15 ENCOUNTER — Encounter: Payer: Self-pay | Admitting: Emergency Medicine

## 2022-07-15 DIAGNOSIS — Z794 Long term (current) use of insulin: Secondary | ICD-10-CM | POA: Insufficient documentation

## 2022-07-15 DIAGNOSIS — Z7982 Long term (current) use of aspirin: Secondary | ICD-10-CM | POA: Diagnosis not present

## 2022-07-15 DIAGNOSIS — I2510 Atherosclerotic heart disease of native coronary artery without angina pectoris: Secondary | ICD-10-CM | POA: Insufficient documentation

## 2022-07-15 DIAGNOSIS — S0990XA Unspecified injury of head, initial encounter: Secondary | ICD-10-CM | POA: Diagnosis not present

## 2022-07-15 DIAGNOSIS — N1831 Chronic kidney disease, stage 3a: Secondary | ICD-10-CM | POA: Insufficient documentation

## 2022-07-15 DIAGNOSIS — Z7984 Long term (current) use of oral hypoglycemic drugs: Secondary | ICD-10-CM | POA: Diagnosis not present

## 2022-07-15 DIAGNOSIS — E039 Hypothyroidism, unspecified: Secondary | ICD-10-CM | POA: Diagnosis not present

## 2022-07-15 DIAGNOSIS — Z7901 Long term (current) use of anticoagulants: Secondary | ICD-10-CM | POA: Diagnosis not present

## 2022-07-15 DIAGNOSIS — Z8616 Personal history of COVID-19: Secondary | ICD-10-CM | POA: Insufficient documentation

## 2022-07-15 DIAGNOSIS — E1140 Type 2 diabetes mellitus with diabetic neuropathy, unspecified: Secondary | ICD-10-CM | POA: Diagnosis not present

## 2022-07-15 DIAGNOSIS — I130 Hypertensive heart and chronic kidney disease with heart failure and stage 1 through stage 4 chronic kidney disease, or unspecified chronic kidney disease: Secondary | ICD-10-CM | POA: Insufficient documentation

## 2022-07-15 DIAGNOSIS — I5042 Chronic combined systolic (congestive) and diastolic (congestive) heart failure: Secondary | ICD-10-CM | POA: Diagnosis not present

## 2022-07-15 DIAGNOSIS — W182XXA Fall in (into) shower or empty bathtub, initial encounter: Secondary | ICD-10-CM | POA: Insufficient documentation

## 2022-07-15 DIAGNOSIS — Z79899 Other long term (current) drug therapy: Secondary | ICD-10-CM | POA: Diagnosis not present

## 2022-07-15 DIAGNOSIS — M25562 Pain in left knee: Secondary | ICD-10-CM | POA: Diagnosis not present

## 2022-07-15 NOTE — ED Triage Notes (Signed)
Pt presents via [facility] from [facility] following a witnessed fall in the shower. Pt hit her head on the wall and is on eliquis. Pts only complaint is L knee pain. A&Ox4 - Hx of developmental delay but is at her baseline per EMS. Denies LOC.

## 2022-07-16 ENCOUNTER — Emergency Department
Admission: EM | Admit: 2022-07-16 | Discharge: 2022-07-16 | Disposition: A | Discharge status: Home / Self Care | Payer: Medicare Other | Attending: Emergency Medicine | Admitting: Emergency Medicine

## 2022-07-16 ENCOUNTER — Emergency Department: Payer: Medicare Other

## 2022-07-16 DIAGNOSIS — S0990XA Unspecified injury of head, initial encounter: Secondary | ICD-10-CM | POA: Diagnosis not present

## 2022-07-16 DIAGNOSIS — W19XXXA Unspecified fall, initial encounter: Secondary | ICD-10-CM

## 2022-07-16 DIAGNOSIS — M25562 Pain in left knee: Secondary | ICD-10-CM

## 2022-07-16 NOTE — ED Provider Notes (Signed)
[facility] Provider Note    Event Date/Time   First MD Initiated Contact with Patient 07/16/22 0136     (approximate)   History   Fall   HPI  History obtained via patient's caregiver  Elizabeth Gutierrez is a 62 y.o. female brought to the ED via EMS from [facility] status post witnessed fall in the shower.  Patient fell, striking her head on the wall.  Takes Eliquis.  Caregiver witnessed the incident.  States patient did not suffer LOC.  Patient's only complaint is chronic left knee pain for which she receives IM injections.  Giver states she was able to walk after the incident.  Caregiver states patient is at her baseline mental state.  Patient denies neck pain, chest pain, shortness of breath, nausea, vomiting or dizziness.     Past Medical History   Past Medical History:  Diagnosis Date   CHF (congestive heart failure) (HCC)    Coronary artery disease    Diabetes (HCC)    Heart attack (HCC)    Hyperlipidemia    Hypertension    MR (mental retardation)    Mild   Paranoid schizophrenia (HCC)    Thyroid disease      Active Problem List   Patient Active Problem List   Diagnosis Date Noted   Acute hypoxic respiratory failure (HCC) 04/07/2022   Hypotension 04/07/2022   Toxic metabolic encephalopathy 04/07/2022   Somnolence 04/06/2022   Sepsis due to gram-negative UTI (HCC) 03/08/2022   Generalized weakness 03/08/2022   Anxiety and depression 03/08/2022   Essential hypertension 03/08/2022   Acute conjunctivitis 03/08/2022   Malnutrition of moderate degree 11/28/2019   Goals of care, counseling/discussion    Palliative care by specialist    DNR (do not resuscitate) discussion    Hypercalcemia    Tachycardia    Weakness    Acute kidney injury superimposed on CKD (HCC)    COVID-19 11/22/2019   Bilateral pneumonia 09/28/2019   Shortness of breath 09/26/2019   Hyperlipidemia    Paranoid schizophrenia (HCC)    Type 2 diabetes  mellitus with hyperlipidemia (HCC)    Hypothyroidism    Chronic kidney disease, stage 3a (HCC)    Acute on chronic respiratory failure with hypoxia (HCC)    Acute metabolic encephalopathy 03/20/2019   Altered mental state 08/17/2016   DDD (degenerative disc disease), lumbar 07/09/2015   Facet syndrome, lumbar 07/09/2015   Lumbar radiculopathy 07/09/2015   Sacroiliac joint dysfunction 07/09/2015   Spinal stenosis, lumbar region, 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   Diabetic neuropathy (HCC) 07/09/2015   CAD in native artery 06/16/2015   Controlled type 2 diabetes mellitus without complication (HCC) 06/16/2015   Benign essential HTN 06/16/2015   Obstructive apnea 01/05/2015   Esophagitis, reflux 08/06/2014   Edema leg 05/14/2014   Chronic combined systolic (congestive) and diastolic (congestive) heart failure (HCC) 05/14/2014   TI (tricuspid incompetence) 05/14/2014   Chest pain 12/13/2013   Combined fat and carbohydrate induced hyperlipemia 10/09/2013   Breath shortness 10/09/2013     Past Surgical History   Past Surgical History:  Procedure Laterality Date   FOOT SURGERY Right      Home Medications   Prior to Admission medications   Medication Sig Start Date End Date Taking? Authorizing Provider  acetaminophen (TYLENOL) 325 MG tablet Take 650 mg by mouth every 4 (four) hours as needed for moderate pain, fever or headache.     [provider]  apixaban Everlene Balls)  5 MG TABS tablet Take 1 tablet (5 mg total) by mouth 2 (two) times daily. 04/09/22   Lurene Shadow, MD  ascorbic acid (VITAMIN C) 1000 MG tablet Take 1 tablet by mouth daily.    [provider]  aspirin 81 MG tablet Take 81 mg by mouth daily.    [provider]  atorvastatin (LIPITOR) 20 MG tablet Take 20 mg by mouth daily.    [provider]  BELSOMRA 15 MG TABS Take 1 tablet by mouth at bedtime. Patient not taking: Reported on 06/08/2022 05/05/21   [provider]   benztropine (COGENTIN) 0.5 MG tablet Take 0.5 mg by mouth 2 (two) times daily.    [provider]  carvedilol (COREG) 3.125 MG tablet Take 3.125 mg by mouth 2 (two) times daily. 12/26/18   [provider]  divalproex (DEPAKOTE) 125 MG DR tablet Take 250 mg by mouth 2 (two) times daily.    [provider]  docusate sodium (COLACE) 100 MG capsule Take 100 mg by mouth 2 (two) times daily. As needed for mild constipation    [provider]  donepezil (ARICEPT) 10 MG tablet Take 1 tablet by mouth at bedtime. 04/07/21   [provider]  Dulaglutide (TRULICITY) 0.75 MG/0.5ML SOPN Inject 0.75 mg into the skin once a week.    [provider]  fluPHENAZine decanoate (PROLIXIN) 25 MG/ML injection Inject 31.25 mg into the muscle every 14 (fourteen) days.     [provider]  fluticasone (FLONASE) 50 MCG/ACT nasal spray Place 2 sprays into both nostrils daily.     [provider]  furosemide (LASIX) 20 MG tablet Take 1 tablet (20 mg total) by mouth daily as needed for fluid or edema. 10/01/19 09/30/20  Lurene Shadow, MD  gabapentin (NEURONTIN) 100 MG capsule Take 200 mg by mouth 3 (three) times daily.    [provider]  ibuprofen (ADVIL) 600 MG tablet Take 600 mg by mouth every 6 (six) hours as needed.    [provider]  JARDIANCE 25 MG TABS tablet Take 25 mg by mouth daily.    [provider]  ketoconazole (NIZORAL) 2 % cream Apply 1 application topically in the morning and at bedtime. 04/08/21   [provider]  Lemborexant (DAYVIGO) 10 MG TABS Take 1 tablet by mouth at bedtime.    [provider]  levothyroxine (SYNTHROID) 25 MCG tablet Take 1 tablet (25 mcg total) by mouth daily before breakfast. 12/07/19   Darlin Priestly, MD  metFORMIN (GLUCOPHAGE-XR) 500 MG 24 hr tablet Take 500 mg by mouth daily with breakfast.    [provider]  nystatin cream (MYCOSTATIN) Apply 1 application topically  2 (two) times daily. 12/07/19   Darlin Priestly, MD  omeprazole (PRILOSEC OTC) 20 MG tablet Take 20 mg by mouth daily.    [provider]  omeprazole (PRILOSEC) 20 MG capsule Take 20 mg by mouth 2 (two) times daily.  12/26/18   [provider]  polyethylene glycol (MIRALAX / GLYCOLAX) 17 g packet Take 17 g by mouth daily.     [provider]  QUEtiapine (SEROQUEL) 100 MG tablet Take 100 mg by mouth daily. At noon 12/06/21   [provider]  QUEtiapine (SEROQUEL) 200 MG tablet Take 600 mg by mouth at bedtime. 10/05/21   [provider]  RA NO FLUSH NIACIN 500 MG tablet Take 1,000 mg by mouth 2 (two) times daily. 02/18/22   [provider]  senna Mancel Parsons)  8.6 MG tablet Take 1 tablet by mouth daily.    [provider]  sertraline (ZOLOFT) 100 MG tablet Take 100 mg by mouth in the morning.     [provider]  zinc sulfate 220 (50 Zn) MG capsule Take 220 mg by mouth daily.    [provider]     Allergies  Bee venom   Family History   Family History  Problem Relation Age of Onset   Alcohol abuse Father    Breast cancer Neg Hx      Physical Exam  Triage Vital Signs: ED Triage Vitals  Enc Vitals Group     BP 07/15/22 2342 116/72     Pulse Rate 07/15/22 2342 72     Resp 07/15/22 2342 18     Temp 07/15/22 2342 97.9 F (36.6 C)     Temp Source 07/15/22 2342 Oral     SpO2 07/15/22 2341 98 %     Weight 07/15/22 2343 147 lb 4.3 oz (66.8 kg)     Height 07/15/22 2343 5\' 8"  (1.727 m)     Head Circumference --      Peak Flow --      Pain Score --      Pain Loc --      Pain Edu? --      Excl. in GC? --     Updated Vital Signs: BP 116/72 (BP Location: Left Arm)   Pulse 72   Temp 97.9 F (36.6 C) (Oral)   Resp 18   Ht 5\' 8"  (1.727 m)   Wt 66.8 kg   LMP  (LMP Unknown)   SpO2 98%   BMI 22.39 kg/m    General: Awake, no distress.  CV:  RRR.  Good peripheral perfusion.  Resp:  Normal effort.   CTAB. Abd:  Nontender.  No distention.  Other:  Head is atraumatic.  Nose is atraumatic.  No dental malocclusion.  PERR L.  EOMI.  No cervical spine tenderness to palpation, step-offs or deformities.  Pelvis is stable.  Full range of bilateral hips without pain.  Left knee without hemarthrosis.  Mildly tender anteriorly with full range of motion.  2+ femoral distal pulses.   ED Results / Procedures / Treatments  Labs (all labs ordered are listed, but only abnormal results are displayed) Labs Reviewed - No data to display   EKG  ED ECG REPORT I, Koty Anctil J, the attending physician, personally viewed and interpreted this ECG.   Date: 07/16/2022  EKG Time: 2342  Rate: 68  Rhythm: normal sinus rhythm  Axis: LAD  Intervals: bifascicular block  ST&T Change: Nonspecific    RADIOLOGY I have independently visualized and interpreted patient's xray and CT scans as well as noted the radiology interpretation:  Left knee xray: marked severe tricompartmental degenerative changes  CT Head: No ICH  CT Cervical Spine: No acute fracture  Official radiology report(s): CT HEAD WO CONTRAST ( )  Result Date: 07/16/2022 CLINICAL DATA:  Head trauma, moderate-severe; Neck trauma, midline tenderness (Age 76-64y) EXAM: CT HEAD WITHOUT CONTRAST CT CERVICAL SPINE WITHOUT CONTRAST TECHNIQUE: Multidetector CT imaging of the head and cervical spine was performed following the standard protocol without intravenous contrast. Multiplanar CT image reconstructions of the cervical spine were also generated. RADIATION DOSE REDUCTION: This exam was performed according to the departmental dose-optimization program which includes automated exposure control, adjustment of the mA and/or kV according to patient size and/or use of iterative reconstruction technique. COMPARISON:  CT head 04/06/2022,  CT head and C-spine 03/07/2022, MRI head 04/07/2022, CT angio neck 03/20/2019 FINDINGS: CT HEAD FINDINGS Brain: Patchy and  confluent areas of decreased attenuation are noted throughout the deep and periventricular white matter of the cerebral hemispheres bilaterally, compatible with chronic microvascular ischemic disease. No evidence of large-territorial acute infarction. No parenchymal hemorrhage. No mass lesion. No extra-axial collection. No mass effect or midline shift. No hydrocephalus. Basilar cisterns are patent. Vascular: No hyperdense vessel. Atherosclerotic calcifications are present within the cavernous internal carotid and vertebral arteries. Skull: No acute fracture or focal lesion. Sinuses/Orbits: Paranasal sinuses and mastoid air cells are clear. The orbits are unremarkable. Other: None. CT CERVICAL SPINE FINDINGS Alignment: Slightly more prominent grade 1 anterolisthesis of C4 on C5 and C5 on C6. Skull base and vertebrae: Multilevel mild-to-moderate degenerative changes spine most prominent in the C5-C6 level. No acute fracture. Chronic similar-appearing lytic lesions of the axial and appendicular skeleton. No aggressive appearing focal osseous lesion or focal pathologic process. Soft tissues and spinal canal: No prevertebral fluid or swelling. No visible canal hematoma. Upper chest: Limited evaluation due to motion artifact. No pneumothorax along the apices. Other: Heterogeneous and enlarged thyroid gland. IMPRESSION: 1. No acute intracranial abnormality. 2. No acute displaced fracture or traumatic listhesis of the cervical spine. 3. Slightly more prominent grade 1 anterolisthesis of C4 on C5 and C5 on C6. 4. Chronic similar-appearing lytic lesions of the axial and appendicular skeleton. 5. Heterogeneous and enlarged thyroid gland. Recommend thyroid ultrasound (ref: J Am Coll Radiol. 2015 Feb;12(2): 143-50). Electronically Signed   By: Tish Frederickson M.D.   On: 07/16/2022 00:26   CT Cervical Spine Wo Contrast  Result Date: 07/16/2022 CLINICAL DATA:  Head trauma, moderate-severe; Neck trauma, midline tenderness (Age  58-64y) EXAM: CT HEAD WITHOUT CONTRAST CT CERVICAL SPINE WITHOUT CONTRAST TECHNIQUE: Multidetector CT imaging of the head and cervical spine was performed following the standard protocol without intravenous contrast. Multiplanar CT image reconstructions of the cervical spine were also generated. RADIATION DOSE REDUCTION: This exam was performed according to the departmental dose-optimization program which includes automated exposure control, adjustment of the mA and/or kV according to patient size and/or use of iterative reconstruction technique. COMPARISON:  CT head 04/06/2022, CT head and C-spine 03/07/2022, MRI head 04/07/2022, CT angio neck 03/20/2019 FINDINGS: CT HEAD FINDINGS Brain: Patchy and confluent areas of decreased attenuation are noted throughout the deep and periventricular white matter of the cerebral hemispheres bilaterally, compatible with chronic microvascular ischemic disease. No evidence of large-territorial acute infarction. No parenchymal hemorrhage. No mass lesion. No extra-axial collection. No mass effect or midline shift. No hydrocephalus. Basilar cisterns are patent. Vascular: No hyperdense vessel. Atherosclerotic calcifications are present within the cavernous internal carotid and vertebral arteries. Skull: No acute fracture or focal lesion. Sinuses/Orbits: Paranasal sinuses and mastoid air cells are clear. The orbits are unremarkable. Other: None. CT CERVICAL SPINE FINDINGS Alignment: Slightly more prominent grade 1 anterolisthesis of C4 on C5 and C5 on C6. Skull base and vertebrae: Multilevel mild-to-moderate degenerative changes spine most prominent in the C5-C6 level. No acute fracture. Chronic similar-appearing lytic lesions of the axial and appendicular skeleton. No aggressive appearing focal osseous lesion or focal pathologic process. Soft tissues and spinal canal: No prevertebral fluid or swelling. No visible canal hematoma. Upper chest: Limited evaluation due to motion artifact.  No pneumothorax along the apices. Other: Heterogeneous and enlarged thyroid gland. IMPRESSION: 1. No acute intracranial abnormality. 2. No acute displaced fracture or traumatic listhesis of the cervical spine. 3. Slightly more prominent  grade 1 anterolisthesis of C4 on C5 and C5 on C6. 4. Chronic similar-appearing lytic lesions of the axial and appendicular skeleton. 5. Heterogeneous and enlarged thyroid gland. Recommend thyroid ultrasound (ref: J Am Coll Radiol. 2015 Feb;12(2): 143-50). Electronically Signed   By: Tish Frederickson M.D.   On: 07/16/2022 00:26   DG Knee Complete 4 Views Left  Result Date: 07/16/2022 CLINICAL DATA:  Status post fall. EXAM: LEFT KNEE - COMPLETE 4+ VIEW COMPARISON:  None Available. FINDINGS: No evidence of fracture, dislocation, or joint effusion. Marked severity patellofemoral, medial tibiofemoral and lateral tibiofemoral compartment space narrowing is noted. Soft tissues are unremarkable. IMPRESSION: Marked severity tricompartmental degenerative changes. Electronically Signed   By: Aram Candela M.D.   On: 07/16/2022 00:08     PROCEDURES:  Critical Care performed: No  Procedures   MEDICATIONS ORDERED IN ED: Medications - No data to display   IMPRESSION / MDM / ASSESSMENT AND PLAN / ED COURSE  I reviewed the triage vital signs and the nursing notes.                             62 year old female brought to the ED for witnessed fall. CT imaging, left knee x-ray all negative for acute traumatic injuries.  Caregiver mentions that patient had a Velcro knee brace which is no longer sticky.  Provided knee brace for patient to use as needed for comfort.  Strict return precautions given.  Caregiver verbalizes understanding agrees with plan of care.  Patient's presentation is most consistent with acute, uncomplicated illness.   FINAL CLINICAL IMPRESSION(S) / ED DIAGNOSES   Final diagnoses:  Fall, initial encounter  Minor head injury, initial encounter  Acute  pain of left knee     Rx / DC Orders   ED Discharge Orders     None        Note:  This document was prepared using Dragon voice recognition software and may include unintentional dictation errors.   Irean Hong, MD 07/16/22 [phone]

## 2022-07-16 NOTE — Discharge Instructions (Addendum)
Wear brace as needed for comfort. Return to the ER for worsening symptoms, persistent vomiting, lethargy, difficulty breathing or other concerns.

## 2022-08-25 ENCOUNTER — Ambulatory Visit: Admit: 2022-08-25 | Payer: Medicare Other | Admitting: Ophthalmology

## 2022-08-25 SURGERY — PHACOEMULSIFICATION, CATARACT, WITH IOL INSERTION
Anesthesia: General | Laterality: Right

## 2022-09-08 ENCOUNTER — Ambulatory Visit: Admit: 2022-09-08 | Payer: Medicare Other | Admitting: Ophthalmology

## 2022-09-08 SURGERY — PHACOEMULSIFICATION, CATARACT, WITH IOL INSERTION
Anesthesia: Topical | Laterality: Left

## 2022-09-28 ENCOUNTER — Emergency Department: Payer: Medicare Other

## 2022-09-28 ENCOUNTER — Other Ambulatory Visit: Payer: Self-pay

## 2022-09-28 ENCOUNTER — Emergency Department
Admission: EM | Admit: 2022-09-28 | Discharge: 2022-09-28 | Disposition: A | Discharge status: Home / Self Care | Payer: Medicare Other | Attending: Emergency Medicine | Admitting: Emergency Medicine

## 2022-09-28 DIAGNOSIS — W07XXXA Fall from chair, initial encounter: Secondary | ICD-10-CM | POA: Diagnosis not present

## 2022-09-28 DIAGNOSIS — I509 Heart failure, unspecified: Secondary | ICD-10-CM | POA: Insufficient documentation

## 2022-09-28 DIAGNOSIS — E119 Type 2 diabetes mellitus without complications: Secondary | ICD-10-CM | POA: Insufficient documentation

## 2022-09-28 DIAGNOSIS — S0990XA Unspecified injury of head, initial encounter: Secondary | ICD-10-CM | POA: Diagnosis present

## 2022-09-28 DIAGNOSIS — Z7901 Long term (current) use of anticoagulants: Secondary | ICD-10-CM | POA: Insufficient documentation

## 2022-09-28 DIAGNOSIS — I110 Hypertensive heart disease with heart failure: Secondary | ICD-10-CM | POA: Diagnosis not present

## 2022-09-28 DIAGNOSIS — I2510 Atherosclerotic heart disease of native coronary artery without angina pectoris: Secondary | ICD-10-CM | POA: Diagnosis not present

## 2022-09-28 DIAGNOSIS — W19XXXA Unspecified fall, initial encounter: Secondary | ICD-10-CM

## 2022-09-28 DIAGNOSIS — S0083XA Contusion of other part of head, initial encounter: Secondary | ICD-10-CM | POA: Insufficient documentation

## 2022-09-28 NOTE — ED Notes (Signed)
Legal guardian updated and made aware that patient is here after falling at Group Home

## 2022-09-28 NOTE — ED Provider Notes (Signed)
[facility] Provider Note    Event Date/Time   First MD Initiated Contact with Patient 09/28/22 [phone]     (approximate)   History   Fall   HPI  Elizabeth Gutierrez is a 62 y.o. female with a history of schizophrenia, CHF, CAD, diabetes, hypertension, dyslipidemia, hypothyroidism, and PE on Eliquis who presents from her facility after a head injury.  Per the caregiver, the patient had gotten up from bed, and had just gotten back into bed when she got back up and lost her balance and fell.  The patient denies feeling dizzy or lightheaded.  She reports the front of her head hurts where she hit the ground but denies any other acute pain.  She has no neck or back pain.  She has had no vomiting.  I reviewed the past medical records.  The patient was most recent admitted to the hospital service in January due to metabolic encephalopathy likely due to polypharmacy and hypotension.   Physical Exam   Triage Vital Signs: ED Triage Vitals  Enc Vitals Group     BP 09/28/22 0430 108/66     Pulse Rate 09/28/22 0430 78     Resp 09/28/22 0430 16     Temp 09/28/22 0514 99 F (37.2 C)     Temp Source 09/28/22 0514 Oral     SpO2 09/28/22 0430 92 %     Weight --      Height --      Head Circumference --      Peak Flow --      Pain Score 09/28/22 0436 10     Pain Loc --      Pain Edu? --      Excl. in GC? --     Most recent vital signs: Vitals:   09/28/22 0530 09/28/22 0545  BP: 124/70   Pulse: 75 84  Resp: 16   Temp:    SpO2: 91% 92%     General: Awake, no distress.  CV:  Good peripheral perfusion.  Resp:  Normal effort.  Abd:  No distention.  Other:  Left forehead hematoma and ecchymosis.  EOMI.  PERRLA.  No facial droop.  Motor intact in all extremities.  Normal coordination.  No midline spinal tenderness.   ED Results / Procedures / Treatments   Labs (all labs ordered are listed, but only abnormal results are displayed) Labs Reviewed - No data to  display   EKG  ED ECG REPORT I, Dionne Bucy, the attending physician, personally viewed and interpreted this ECG.  Date: 09/28/2022 EKG Time: 0442 Rate: 69 Rhythm: normal sinus rhythm QRS Axis: normal Intervals: RBBB ST/T Wave abnormalities: normal (initial EKG with poor quality baseline was read by machine as acute MI, repeat is normal) Narrative Interpretation: no evidence of acute ischemia; no significant change when compared EKG of 07/15/2022    RADIOLOGY  CT head: I independently viewed and interpreted the images; there is no ICH.  Radiology report indicates left forehead hematoma with no acute intracranial findings.  PROCEDURES:  Critical Care performed: No  Procedures   MEDICATIONS ORDERED IN ED: Medications - No data to display   IMPRESSION / MDM / ASSESSMENT AND PLAN / ED COURSE  I reviewed the triage vital signs and the nursing notes.  62 year old female with PMH as noted above, on Eliquis, presents with a head injury and left forehead hematoma after an apparent mechanical fall from standing height.  The patient denies other acute  pain.  On exam, the vital signs are normal.  Neurologic exam is nonfocal.  There is no midline spinal tenderness or evidence of other injury.  Differential diagnosis includes, but is not limited to, minor head injury, concussion, ICH.  Will obtain CT for further evaluation.  Patient's presentation is most consistent with acute presentation with potential threat to life or bodily function.  ----------------------------------------- 6:10 AM on 09/28/2022 -----------------------------------------  CT is negative.  The patient remains alert and comfortable appearing.  She is stable for discharge back to her facility.  I counseled the caregiver on the results of the workup and on return precautions; she expressed understanding.   FINAL CLINICAL IMPRESSION(S) / ED DIAGNOSES   Final diagnoses:  Injury of head, initial encounter   Fall, initial encounter  Contusion of forehead, initial encounter     Rx / DC Orders   ED Discharge Orders     None        Note:  This document was prepared using Dragon voice recognition software and may include unintentional dictation errors.    Dionne Bucy, MD 09/28/22 [phone]

## 2022-09-28 NOTE — ED Triage Notes (Signed)
Patient coming to ED for evaluation of fall with swelling and hematoma to L eyebrow.  Patient reports she tripped over a chair and fall.  Fall was unwitnessed.  Pt reports no LOC.  Takes Eliquis.  Resides at [facility].  Hx of developmental delay

## 2022-11-30 ENCOUNTER — Encounter: Payer: Self-pay | Admitting: Primary Care

## 2022-12-05 ENCOUNTER — Other Ambulatory Visit: Payer: Self-pay | Admitting: Family Medicine

## 2022-12-05 DIAGNOSIS — R34 Anuria and oliguria: Secondary | ICD-10-CM

## 2022-12-13 ENCOUNTER — Ambulatory Visit: Payer: Medicare Other

## 2022-12-15 ENCOUNTER — Ambulatory Visit
Admission: RE | Admit: 2022-12-15 | Discharge: 2022-12-15 | Disposition: A | Discharge status: Home / Self Care | Payer: Medicare Other | Source: Ambulatory Visit | Attending: Family Medicine | Admitting: Family Medicine

## 2022-12-15 DIAGNOSIS — R34 Anuria and oliguria: Secondary | ICD-10-CM | POA: Diagnosis present

## 2023-06-26 ENCOUNTER — Emergency Department

## 2023-06-26 ENCOUNTER — Other Ambulatory Visit: Payer: Self-pay

## 2023-06-26 DIAGNOSIS — Z7901 Long term (current) use of anticoagulants: Secondary | ICD-10-CM | POA: Insufficient documentation

## 2023-06-26 DIAGNOSIS — E119 Type 2 diabetes mellitus without complications: Secondary | ICD-10-CM | POA: Diagnosis not present

## 2023-06-26 DIAGNOSIS — Z79899 Other long term (current) drug therapy: Secondary | ICD-10-CM | POA: Insufficient documentation

## 2023-06-26 DIAGNOSIS — I509 Heart failure, unspecified: Secondary | ICD-10-CM | POA: Insufficient documentation

## 2023-06-26 DIAGNOSIS — R0789 Other chest pain: Secondary | ICD-10-CM | POA: Diagnosis not present

## 2023-06-26 DIAGNOSIS — I2510 Atherosclerotic heart disease of native coronary artery without angina pectoris: Secondary | ICD-10-CM | POA: Insufficient documentation

## 2023-06-26 DIAGNOSIS — R079 Chest pain, unspecified: Secondary | ICD-10-CM | POA: Diagnosis present

## 2023-06-26 DIAGNOSIS — Z7984 Long term (current) use of oral hypoglycemic drugs: Secondary | ICD-10-CM | POA: Insufficient documentation

## 2023-06-26 DIAGNOSIS — Z7982 Long term (current) use of aspirin: Secondary | ICD-10-CM | POA: Insufficient documentation

## 2023-06-26 DIAGNOSIS — I110 Hypertensive heart disease with heart failure: Secondary | ICD-10-CM | POA: Insufficient documentation

## 2023-06-26 LAB — BASIC METABOLIC PANEL WITH GFR
Anion gap: 8 (ref 5–15)
BUN: 30 mg/dL — ABNORMAL HIGH (ref 8–23)
CO2: 25 mmol/L (ref 22–32)
Calcium: 9.6 mg/dL (ref 8.9–10.3)
Chloride: 106 mmol/L (ref 98–111)
Creatinine, Ser: 1.33 mg/dL — ABNORMAL HIGH (ref 0.44–1.00)
GFR, Estimated: 45 mL/min — ABNORMAL LOW (ref >60)
Glucose, Bld: 158 mg/dL — ABNORMAL HIGH (ref 70–99)
Potassium: 4 mmol/L (ref 3.5–5.1)
Sodium: 139 mmol/L (ref 135–145)

## 2023-06-26 LAB — CBC
HCT: 38.7 % (ref 36.0–46.0)
Hemoglobin: 12 g/dL (ref 12.0–15.0)
MCH: 29.3 pg (ref 26.0–34.0)
MCHC: 31 g/dL (ref 30.0–36.0)
MCV: 94.4 fL (ref 80.0–100.0)
Platelets: 244 10*3/uL (ref 150–400)
RBC: 4.1 MIL/uL (ref 3.87–5.11)
RDW: 14.9 % (ref 11.5–15.5)
WBC: 5.2 10*3/uL (ref 4.0–10.5)
nRBC: 0 % (ref 0.0–0.2)

## 2023-06-26 LAB — TROPONIN I (HIGH SENSITIVITY): Troponin I (High Sensitivity): 16 ng/L (ref <18)

## 2023-06-26 NOTE — ED Triage Notes (Addendum)
 Pt reports chest pain right arm left knee pain that began tonight. Pt also states she has been congested. Pt denies recent fall or injury to areas. Pt was given 324 aspirin by EMS

## 2023-06-27 ENCOUNTER — Emergency Department
Admission: EM | Admit: 2023-06-27 | Discharge: 2023-06-27 | Disposition: A | Discharge status: Home / Self Care | Attending: Emergency Medicine | Admitting: Emergency Medicine

## 2023-06-27 DIAGNOSIS — R0789 Other chest pain: Secondary | ICD-10-CM

## 2023-06-27 LAB — TROPONIN I (HIGH SENSITIVITY): Troponin I (High Sensitivity): 17 ng/L (ref <18)

## 2023-06-27 MED ORDER — ONDANSETRON 4 MG PO TBDP
4.0000 mg | ORAL_TABLET | Freq: Once | ORAL | Status: AC
  Administered 2023-06-27: 4 mg via ORAL
  Filled 2023-06-27: qty 1

## 2023-06-27 MED ORDER — HYDROCODONE-ACETAMINOPHEN 5-325 MG PO TABS
1.0000 | ORAL_TABLET | Freq: Once | ORAL | Status: AC
  Administered 2023-06-27: 1 via ORAL
  Filled 2023-06-27: qty 1

## 2023-06-27 NOTE — Discharge Instructions (Addendum)
You may alternate Tylenol 1000 mg every 6 hours as needed for pain, fever and Ibuprofen 800 mg every 6-8 hours as needed for pain, fever.  Please take Ibuprofen with food.  Do not take more than 4000 mg of Tylenol (acetaminophen) in a 24 hour period. ° °

## 2023-06-27 NOTE — ED Provider Notes (Signed)
 [facility] Provider Note    Event Date/Time   First MD Initiated Contact with Patient 06/27/23 0400     (approximate)   History   Chest Pain   HPI  Elizabeth Gutierrez is a 63 y.o. female with history of hypertension, hyperlipidemia, diabetes, CAD, CHF, mental retardation, paranoid schizophrenia who presents to the emergency department with her guardian for complaints of chest pain.  She points to the center of her chest but is unable to describe this further.  No shortness of breath, nausea or vomiting, diaphoresis, fever or cough.  No lower extremity swelling or pain.  Pain worse with palpation.   History provided by patient, legal guardian.    Past Medical History:  Diagnosis Date   CHF (congestive heart failure) (HCC)    Coronary artery disease    Diabetes (HCC)    Heart attack (HCC)    Hyperlipidemia    Hypertension    MR (mental retardation)    Mild   Paranoid schizophrenia (HCC)    Thyroid disease     Past Surgical History:  Procedure Laterality Date   FOOT SURGERY Right     MEDICATIONS:  Prior to Admission medications   Medication Sig Start Date End Date Taking? Authorizing Provider  acetaminophen (TYLENOL) 325 MG tablet Take 650 mg by mouth every 4 (four) hours as needed for moderate pain, fever or headache.     [provider]  apixaban (ELIQUIS) 5 MG TABS tablet Take 1 tablet (5 mg total) by mouth 2 (two) times daily. 04/09/22   Lurene Shadow, MD  ascorbic acid (VITAMIN C) 1000 MG tablet Take 1 tablet by mouth daily.    [provider]  aspirin 81 MG tablet Take 81 mg by mouth daily.    [provider]  atorvastatin (LIPITOR) 20 MG tablet Take 20 mg by mouth daily.    [provider]  BELSOMRA 15 MG TABS Take 1 tablet by mouth at bedtime. Patient not taking: Reported on 06/08/2022 05/05/21   [provider]  benztropine (COGENTIN) 0.5 MG tablet Take 0.5 mg by mouth 2 (two) times daily.     [provider]  carvedilol (COREG) 3.125 MG tablet Take 3.125 mg by mouth 2 (two) times daily. 12/26/18   [provider]  divalproex (DEPAKOTE) 125 MG DR tablet Take 250 mg by mouth 2 (two) times daily.    [provider]  docusate sodium (COLACE) 100 MG capsule Take 100 mg by mouth 2 (two) times daily. As needed for mild constipation    [provider]  donepezil (ARICEPT) 10 MG tablet Take 1 tablet by mouth at bedtime. 04/07/21   [provider]  Dulaglutide (TRULICITY) 0.75 MG/0.5ML SOPN Inject 0.75 mg into the skin once a week.    [provider]  fluPHENAZine decanoate (PROLIXIN) 25 MG/ML injection Inject 31.25 mg into the muscle every 14 (fourteen) days.     [provider]  fluticasone (FLONASE) 50 MCG/ACT nasal spray Place 2 sprays into both nostrils daily.     [provider]  furosemide (LASIX) 20 MG tablet Take 1 tablet (20 mg total) by mouth daily as needed for fluid or edema. 10/01/19 09/30/20  Lurene Shadow, MD  gabapentin (NEURONTIN) 100 MG capsule Take 200 mg by mouth 3 (three) times daily.    [provider]  ibuprofen (ADVIL) 600 MG tablet Take 600 mg by mouth every 6 (six) hours as needed.    [provider]  JARDIANCE 25 MG TABS tablet Take 25 mg by mouth daily.    [provider]  ketoconazole (NIZORAL) 2 % cream Apply 1 application topically in the morning and at bedtime. 04/08/21   [provider]  Lemborexant (DAYVIGO) 10 MG TABS Take 1 tablet by mouth at bedtime.    [provider]  levothyroxine (SYNTHROID) 25 MCG tablet Take 1 tablet (25 mcg total) by mouth daily before breakfast. 12/07/19   Darlin Priestly, MD  metFORMIN (GLUCOPHAGE-XR) 500 MG 24 hr tablet Take 500 mg by mouth daily with breakfast.    [provider]  nystatin cream (MYCOSTATIN) Apply 1 application topically 2 (two) times daily. 12/07/19   Darlin Priestly, MD  omeprazole (PRILOSEC OTC) 20 MG  tablet Take 20 mg by mouth daily.    [provider]  omeprazole (PRILOSEC) 20 MG capsule Take 20 mg by mouth 2 (two) times daily.  12/26/18   [provider]  polyethylene glycol (MIRALAX / GLYCOLAX) 17 g packet Take 17 g by mouth daily.     [provider]  QUEtiapine (SEROQUEL) 100 MG tablet Take 100 mg by mouth daily. At noon 12/06/21   [provider]  QUEtiapine (SEROQUEL) 200 MG tablet Take 600 mg by mouth at bedtime. 10/05/21   [provider]  RA NO FLUSH NIACIN 500 MG tablet Take 1,000 mg by mouth 2 (two) times daily. 02/18/22   [provider]  senna (SENOKOT) 8.6 MG tablet Take 1 tablet by mouth daily.    [provider]  sertraline (ZOLOFT) 100 MG tablet Take 100 mg by mouth in the morning.     [provider]  zinc sulfate 220 (50 Zn) MG capsule Take 220 mg by mouth daily.    [provider]    Physical Exam   Triage Vital Signs: ED Triage Vitals  Encounter Vitals Group     BP 06/26/23 2126 113/75     Systolic BP Percentile --      Diastolic BP Percentile --      Pulse Rate 06/26/23 2126 87     Resp 06/26/23 2126 18     Temp 06/26/23 2126 98.3 F (36.8 C)     Temp Source 06/26/23 2126 Oral     SpO2 06/26/23 2126 98 %     Weight 06/26/23 2125 150 lb (68 kg)     Height 06/26/23 2125 5\' 7"  (1.702 m)     Head Circumference --      Peak Flow --      Pain Score 06/26/23 2125 9     Pain Loc --      Pain Education --      Exclude from Growth Chart --     Most recent vital signs: Vitals:   06/27/23 0156 06/27/23 0434  BP: 103/66 110/61  Pulse: 88 80  Resp: 17 18  Temp: 98.6 F (37 C) 98.7 F (37.1 C)  SpO2: 93% 95%    CONSTITUTIONAL: Alert, well-appearing, no distress HEAD: Normocephalic, atraumatic EYES: Conjunctivae clear, pupils appear equal, sclera nonicteric ENT: normal nose; moist mucous membranes NECK: Supple, normal ROM CARD: RRR; S1 and S2 appreciated CHEST:  Chest wall is  tender to palpation.  No crepitus, ecchymosis, erythema, warmth, rash or other lesions present.   RESP: Normal chest excursion without splinting or tachypnea; breath sounds clear and equal bilaterally; no wheezes, no rhonchi, no rales, no hypoxia or respiratory distress, speaking full sentences ABD/GI: Non-distended; soft, non-tender, no rebound, no  guarding, no peritoneal signs BACK: The back appears normal EXT: Normal ROM in all joints; no deformity noted, no edema, no calf tenderness or calf swelling SKIN: Normal color for age and race; warm; no rash on exposed skin NEURO: Moves all extremities equally, normal speech PSYCH: The patient's mood and manner are appropriate.   ED Results / Procedures / Treatments   LABS: (all labs ordered are listed, but only abnormal results are displayed) Labs Reviewed  BASIC METABOLIC PANEL WITH GFR - Abnormal; Notable for the following components:      Result Value   Glucose, Bld 158 (*)    BUN 30 (*)    Creatinine, Ser 1.33 (*)    GFR, Estimated 45 (*)    All other components within normal limits  CBC  TROPONIN I (HIGH SENSITIVITY)  TROPONIN I (HIGH SENSITIVITY)     EKG:  EKG Interpretation Date/Time:  Monday June 26 2023 21:30:23 EDT Ventricular Rate:  93 PR Interval:  182 QRS Duration:  154 QT Interval:  428 QTC Calculation: 532 R Axis:   -83  Text Interpretation: Normal sinus rhythm Right bundle branch block Left anterior fascicular block Bifascicular block Anterolateral infarct , age undetermined Abnormal ECG When compared with ECG of 28-Sep-2022 04:42, PREVIOUS ECG IS PRESENT No significant change since last tracing Confirmed by Rochele Raring [phone]) on 06/27/2023 6:31:12 AM         RADIOLOGY: My personal review and interpretation of imaging: Chest x-ray clear.  I have personally reviewed all radiology reports.   DG Chest 2 View Result Date: 06/26/2023 CLINICAL DATA:  cp EXAM: CHEST - 2 VIEW COMPARISON:  Chest x-ray 03/07/2022  FINDINGS: The heart and mediastinal contours are unchanged. Low lung volumes. No focal consolidation. No pulmonary edema. No pleural effusion. No pneumothorax. No acute osseous abnormality. IMPRESSION: Low lung volumes with no active cardiopulmonary disease. Electronically Signed   By: Tish Frederickson M.D.   On: 06/26/2023 23:28     PROCEDURES:  Critical Care performed: No    Procedures    IMPRESSION / MDM / ASSESSMENT AND PLAN / ED COURSE  I reviewed the triage vital signs and the nursing notes.    Patient here with chest pain.  Chest wall tender to palpation which reproduces her symptoms.    DIFFERENTIAL DIAGNOSIS (includes but not limited to):   Chest wall pain, ACS, PE, pneumonia, CHF, pneumothorax, doubt dissection   Patient's presentation is most consistent with acute presentation with potential threat to life or bodily function.   PLAN: EKG shows no new ischemic change.  Troponin x 2 negative.  Stable chronic kidney disease.  Normal hemoglobin.  Chest x-ray reviewed and interpreted by myself and the radiologist and is clear.  Pain reproducible with palpation.  Likely musculoskeletal.  Will give pain medication here but I feel she is safe for discharge with close outpatient follow-up given she does have history of CAD, CHF.  No signs of volume overload on exam.  Low suspicion for PE given no hypoxia, tachycardia, tachypnea.  Legal guardian at bedside comfortable with this plan.   MEDICATIONS GIVEN IN ED: Medications  HYDROcodone-acetaminophen (NORCO/VICODIN) 5-325 MG per tablet 1 tablet (1 tablet Oral Given 06/27/23 0430)  ondansetron (ZOFRAN-ODT) disintegrating tablet 4 mg (4 mg Oral Given 06/27/23 0430)     ED COURSE:  At this time, I do not feel there is any life-threatening condition present. I reviewed all nursing notes, vitals, pertinent previous records.  All lab and urine results, EKGs, imaging ordered  have been independently reviewed and interpreted by myself.  I  reviewed all available radiology reports from any imaging ordered this visit.  Based on my assessment, I feel the patient is safe to be discharged home without further emergent workup and can continue workup as an outpatient as needed. Discussed all findings, treatment plan as well as usual and customary return precautions.  They verbalize understanding and are comfortable with this plan.  Outpatient follow-up has been provided as needed.  All questions have been answered.    CONSULTS:  none   OUTSIDE RECORDS REVIEWED:  Echo 04/07/2022:  IMPRESSIONS     1. Left ventricular ejection fraction, by estimation, is 25 to 30%. The  left ventricle has severely decreased function. Mildly dilated LV.   2. The left ventricle demonstrates global hypokinesis. basal wall motion  best preserved. Apical images not available. Left ventricular diastolic  parameters are indeterminate.   3. Right ventricular systolic function is normal. The right ventricular  size is normal. There is normal pulmonary artery systolic pressure. The  estimated right ventricular systolic pressure is 13.0 mmHg.   4. The mitral valve is normal in structure. No evidence of mitral valve  regurgitation. No evidence of mitral stenosis.   5. The aortic valve has an indeterminant number of cusps. Aortic valve  regurgitation is not visualized. No aortic stenosis is present.   6. The inferior vena cava is normal in size with greater than 50%  respiratory variability, suggesting right atrial pressure of 3 mmHg.        FINAL CLINICAL IMPRESSION(S) / ED DIAGNOSES   Final diagnoses:  Chest wall pain     Rx / DC Orders   ED Discharge Orders     None        Note:  This document was prepared using Dragon voice recognition software and may include unintentional dictation errors.   Akshaya Toepfer, Layla Maw, DO 06/27/23 [phone]

## 2023-06-28 DIAGNOSIS — Z79899 Other long term (current) drug therapy: Secondary | ICD-10-CM | POA: Insufficient documentation

## 2023-06-28 DIAGNOSIS — F0390 Unspecified dementia without behavioral disturbance: Secondary | ICD-10-CM | POA: Insufficient documentation

## 2023-09-04 ENCOUNTER — Other Ambulatory Visit: Payer: Self-pay | Admitting: Family Medicine

## 2023-09-04 DIAGNOSIS — Z1231 Encounter for screening mammogram for malignant neoplasm of breast: Secondary | ICD-10-CM

## 2023-09-11 ENCOUNTER — Ambulatory Visit
Admission: RE | Admit: 2023-09-11 | Discharge: 2023-09-11 | Disposition: A | Discharge status: Home / Self Care | Source: Ambulatory Visit

## 2023-09-11 DIAGNOSIS — Z1231 Encounter for screening mammogram for malignant neoplasm of breast: Secondary | ICD-10-CM

## 2023-10-23 NOTE — Progress Notes (Deleted)
 PCP: Tobie Domino, MD   No chief complaint on file.   HPI:      [name] is a 63 y.o. No obstetric history on file. whose LMP was No LMP recorded (lmp unknown). Patient is postmenopausal., presents today for her NP MEDICARE annual examination.  Her menses are {norm/abn:715}, lasting {number: 22536} days.  Dysmenorrhea {dysmen:716}. She {does:18564} have intermenstrual bleeding. She {does:18564} have vasomotor sx.   Sex activity: {sex active: 315163}. She {does:18564} have vaginal dryness.  Last Pap: {ijuz:695499699}  Results were: {norm/abn:16707::no abnormalities} /neg HPV DNA.  Hx of STDs: {STD hx:14358}  Last mammogram: 09/11/23 Results were: normal--routine follow-up in 12 months There is no FH of breast cancer. There is no FH of ovarian cancer. The patient {does:18564} do self-breast exams.  Colonoscopy: {hx:15363}  Repeat due after 10*** years.   Tobacco use: {tob:20664} Alcohol use: {Alcohol:11675} No drug use Exercise: {exercise:31265}  She {does:18564} get adequate calcium  and Vitamin D  in her diet.  Labs with PCP.   Patient Active Problem List   Diagnosis Date Noted   Acute hypoxic respiratory failure (HCC) 04/07/2022   Hypotension 04/07/2022   Toxic metabolic encephalopathy 04/07/2022   Somnolence 04/06/2022   Sepsis due to gram-negative UTI (HCC) 03/08/2022   Generalized weakness 03/08/2022   Anxiety and depression 03/08/2022   Essential hypertension 03/08/2022   Acute conjunctivitis 03/08/2022   Malnutrition of moderate degree 11/28/2019   Goals of care, counseling/discussion    Palliative care by specialist    DNR (do not resuscitate) discussion    Hypercalcemia    Tachycardia    Weakness    Acute kidney injury superimposed on CKD (HCC)    COVID-19 11/22/2019   Bilateral pneumonia 09/28/2019   Shortness of breath 09/26/2019   Hyperlipidemia    Paranoid schizophrenia (HCC)    Type 2 diabetes mellitus with hyperlipidemia (HCC)     Hypothyroidism    Chronic kidney disease, stage 3a (HCC)    Acute on chronic respiratory failure with hypoxia (HCC)    Acute metabolic encephalopathy 03/20/2019   Altered mental state 08/17/2016   DDD (degenerative disc disease), lumbar 07/09/2015   Facet syndrome, lumbar 07/09/2015   Lumbar radiculopathy 07/09/2015   Sacroiliac joint dysfunction 07/09/2015   Spinal stenosis, lumbar region, 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   Diabetic neuropathy (HCC) 07/09/2015   CAD in native artery 06/16/2015   Controlled type 2 diabetes mellitus without complication (HCC) 06/16/2015   Benign essential HTN 06/16/2015   Obstructive apnea 01/05/2015   Esophagitis, reflux 08/06/2014   Edema leg 05/14/2014   Chronic combined systolic (congestive) and diastolic (congestive) heart failure (HCC) 05/14/2014   TI (tricuspid incompetence) 05/14/2014   Chest pain 12/13/2013   Combined fat and carbohydrate induced hyperlipemia 10/09/2013   Breath shortness 10/09/2013    Past Surgical History:  Procedure Laterality Date   FOOT SURGERY Right     Family History  Problem Relation Age of Onset   Alcohol abuse Father    Breast cancer Neg Hx     Social History   Socioeconomic History   Marital status: Single    Spouse name: Not on file   Number of children: Not on file   Years of education: Not on file   Highest education level: Not on file  Occupational History   Not on file  Tobacco Use   Smoking status: Former    Current packs/day: 0.00    Types: Cigarettes    Quit date: 07/08/2013    Years  since quitting: 10.2   Smokeless tobacco: Former  Substance and Sexual Activity   Alcohol use: No    Alcohol/week: 0.0 standard drinks of alcohol   Drug use: No   Sexual activity: Not on file  Other Topics Concern   Not on file  Social History Narrative   Not on file   Social Drivers of Health   Financial Resource Strain: Not on file  Food Insecurity: No Food Insecurity (03/08/2022)    Hunger Vital Sign    Worried About Running Out of Food in the Last Year: Never true    Ran Out of Food in the Last Year: Never true  Transportation Needs: No Transportation Needs (03/08/2022)   PRAPARE - Administrator, Civil Service (Medical): No    Lack of Transportation (Non-Medical): No  Physical Activity: Not on file  Stress: Not on file  Social Connections: Not on file  Intimate Partner Violence: Not At Risk (03/08/2022)   Humiliation, Afraid, Rape, and Kick questionnaire    Fear of Current or Ex-Partner: No    Emotionally Abused: No    Physically Abused: No    Sexually Abused: No     Current Outpatient Medications:    acetaminophen  (TYLENOL ) 325 MG tablet, Take 650 mg by mouth every 4 (four) hours as needed for moderate pain, fever or headache. , Disp: , Rfl:    apixaban  (ELIQUIS ) 5 MG TABS tablet, Take 1 tablet (5 mg total) by mouth 2 (two) times daily., Disp: , Rfl:    ascorbic acid  (VITAMIN C ) 1000 MG tablet, Take 1 tablet by mouth daily., Disp: , Rfl:    aspirin  81 MG tablet, Take 81 mg by mouth daily., Disp: , Rfl:    atorvastatin  (LIPITOR) 20 MG tablet, Take 20 mg by mouth daily., Disp: , Rfl:    BELSOMRA 15 MG TABS, Take 1 tablet by mouth at bedtime. (Patient not taking: Reported on 06/08/2022), Disp: , Rfl:    benztropine  (COGENTIN ) 0.5 MG tablet, Take 0.5 mg by mouth 2 (two) times daily., Disp: , Rfl:    carvedilol  (COREG ) 3.125 MG tablet, Take 3.125 mg by mouth 2 (two) times daily., Disp: , Rfl:    divalproex  (DEPAKOTE ) 125 MG DR tablet, Take 250 mg by mouth 2 (two) times daily., Disp: , Rfl:    docusate sodium  (COLACE) 100 MG capsule, Take 100 mg by mouth 2 (two) times daily. As needed for mild constipation, Disp: , Rfl:    donepezil  (ARICEPT ) 10 MG tablet, Take 1 tablet by mouth at bedtime., Disp: , Rfl:    Dulaglutide (TRULICITY) 0.75 MG/0.5ML SOPN, Inject 0.75 mg into the skin once a week., Disp: , Rfl:    fluPHENAZine  decanoate (PROLIXIN ) 25 MG/ML  injection, Inject 31.25 mg into the muscle every 14 (fourteen) days. , Disp: , Rfl:    fluticasone  (FLONASE ) 50 MCG/ACT nasal spray, Place 2 sprays into both nostrils daily. , Disp: , Rfl:    furosemide  (LASIX ) 20 MG tablet, Take 1 tablet (20 mg total) by mouth daily as needed for fluid or edema., Disp: 30 tablet, Rfl: 0   gabapentin  (NEURONTIN ) 100 MG capsule, Take 200 mg by mouth 3 (three) times daily., Disp: , Rfl:    ibuprofen (ADVIL) 600 MG tablet, Take 600 mg by mouth every 6 (six) hours as needed., Disp: , Rfl:    JARDIANCE 25 MG TABS tablet, Take 25 mg by mouth daily., Disp: , Rfl:    ketoconazole (NIZORAL) 2 % cream, Apply 1  application topically in the morning and at bedtime., Disp: , Rfl:    Lemborexant (DAYVIGO) 10 MG TABS, Take 1 tablet by mouth at bedtime., Disp: , Rfl:    levothyroxine  (SYNTHROID ) 25 MCG tablet, Take 1 tablet (25 mcg total) by mouth daily before breakfast., Disp: , Rfl:    metFORMIN  (GLUCOPHAGE -XR) 500 MG 24 hr tablet, Take 500 mg by mouth daily with breakfast., Disp: , Rfl:    nystatin  cream (MYCOSTATIN ), Apply 1 application topically 2 (two) times daily., Disp: 30 g, Rfl: 0   omeprazole (PRILOSEC OTC) 20 MG tablet, Take 20 mg by mouth daily., Disp: , Rfl:    omeprazole (PRILOSEC) 20 MG capsule, Take 20 mg by mouth 2 (two) times daily. , Disp: , Rfl:    polyethylene glycol (MIRALAX  / GLYCOLAX ) 17 g packet, Take 17 g by mouth daily. , Disp: , Rfl:    QUEtiapine  (SEROQUEL ) 100 MG tablet, Take 100 mg by mouth daily. At noon, Disp: , Rfl:    QUEtiapine  (SEROQUEL ) 200 MG tablet, Take 600 mg by mouth at bedtime., Disp: , Rfl:    RA NO FLUSH NIACIN 500 MG tablet, Take 1,000 mg by mouth 2 (two) times daily., Disp: , Rfl:    senna (SENOKOT) 8.6 MG tablet, Take 1 tablet by mouth daily., Disp: , Rfl:    sertraline  (ZOLOFT ) 100 MG tablet, Take 100 mg by mouth in the morning. , Disp: , Rfl:    zinc  sulfate 220 (50 Zn) MG capsule, Take 220 mg by mouth daily., Disp: , Rfl:       ROS:  Review of Systems BREAST: No symptoms    Objective: LMP  (LMP Unknown)    OBGyn Exam  Results: No results found for this or any previous visit (from the past 24 hours).  Assessment/Plan:  No diagnosis found.   No orders of the defined types were placed in this encounter.           GYN counsel {counseling: 16159}    F/U  No follow-ups on file.  Arihana Ambrocio B. Kylo Gavin, PA-C 10/23/2023 4:47 PM

## 2023-10-24 ENCOUNTER — Encounter: Admitting: Obstetrics and Gynecology

## 2023-10-24 DIAGNOSIS — Z1231 Encounter for screening mammogram for malignant neoplasm of breast: Secondary | ICD-10-CM

## 2023-10-24 DIAGNOSIS — Z01419 Encounter for gynecological examination (general) (routine) without abnormal findings: Secondary | ICD-10-CM

## 2023-10-24 DIAGNOSIS — Z1211 Encounter for screening for malignant neoplasm of colon: Secondary | ICD-10-CM

## 2023-10-24 DIAGNOSIS — Z124 Encounter for screening for malignant neoplasm of cervix: Secondary | ICD-10-CM

## 2023-10-27 DIAGNOSIS — Z7189 Other specified counseling: Secondary | ICD-10-CM | POA: Insufficient documentation

## 2023-11-02 DIAGNOSIS — Z5181 Encounter for therapeutic drug level monitoring: Secondary | ICD-10-CM | POA: Insufficient documentation

## 2023-11-29 ENCOUNTER — Emergency Department
Admission: EM | Admit: 2023-11-29 | Discharge: 2023-11-29 | Disposition: A | Discharge status: Home / Self Care | Attending: Emergency Medicine | Admitting: Emergency Medicine

## 2023-11-29 ENCOUNTER — Other Ambulatory Visit: Payer: Self-pay

## 2023-11-29 DIAGNOSIS — Z7901 Long term (current) use of anticoagulants: Secondary | ICD-10-CM | POA: Diagnosis not present

## 2023-11-29 DIAGNOSIS — R040 Epistaxis: Secondary | ICD-10-CM | POA: Insufficient documentation

## 2023-11-29 MED ORDER — OXYMETAZOLINE HCL 0.05 % NA SOLN
1.0000 | Freq: Once | NASAL | Status: AC
  Administered 2023-11-29: 1 via NASAL
  Filled 2023-11-29: qty 30

## 2023-11-29 NOTE — ED Triage Notes (Signed)
 Pt arrives via EMS from [facility] on [address], [place] for epistaxis; currently taking eliquis ; no bleeding currently

## 2023-11-29 NOTE — ED Notes (Signed)
 Left nostril bleeding, 2 x 2 inserted in the pt's left nostril. Provider Dr. Suzanne notified.

## 2023-11-29 NOTE — ED Provider Notes (Signed)
 [facility] Provider Note    Event Date/Time   First MD Initiated Contact with Patient 11/29/23 0740     (approximate)   History   Epistaxis   HPI  Elizabeth Gutierrez is a 63 y.o. female presents to the emergency department for nosebleed.  States that she started having nosebleed earlier this morning.  Does endorse picking her nose.  States that is bleeding from both naris but is worse in the left nare.  Does take Eliquis .  Denies any other trauma.  Denies feeling lightheaded or dizzy.  No prior episodes of nosebleeds in the past.     Physical Exam   Triage Vital Signs: ED Triage Vitals  Encounter Vitals Group     BP 11/29/23 0656 121/87     Girls Systolic BP Percentile --      Girls Diastolic BP Percentile --      Boys Systolic BP Percentile --      Boys Diastolic BP Percentile --      Pulse Rate 11/29/23 0656 78     Resp 11/29/23 0656 19     Temp 11/29/23 0656 97.8 F (36.6 C)     Temp src --      SpO2 11/29/23 0653 92 %     Weight 11/29/23 0654 165 lb (74.8 kg)     Height 11/29/23 0654 5' 5 (1.651 m)     Head Circumference --      Peak Flow --      Pain Score 11/29/23 0654 0     Pain Loc --      Pain Education --      Exclude from Growth Chart --     Most recent vital signs: Vitals:   11/29/23 0656 11/29/23 0752  BP: 121/87   Pulse: 78   Resp: 19   Temp: 97.8 F (36.6 C)   SpO2: 94% 94%    Physical Exam Constitutional:      Appearance: She is well-developed.  HENT:     Head: Atraumatic.     Nose:     Right Nostril: No epistaxis or septal hematoma.     Left Nostril: Epistaxis present. No septal hematoma.     Comments: Mild epistaxis with minimal bleeding to left nare. Eyes:     Conjunctiva/sclera: Conjunctivae normal.  Cardiovascular:     Rate and Rhythm: Regular rhythm.  Pulmonary:     Effort: No respiratory distress.  Abdominal:     General: There is no distension.  Musculoskeletal:        General: Normal range of  motion.     Cervical back: Normal range of motion.  Skin:    General: Skin is warm.  Neurological:     Mental Status: She is alert. Mental status is at baseline.      IMPRESSION / MDM / ASSESSMENT AND PLAN / ED COURSE  I reviewed the triage vital signs and the nursing notes.  Differential diagnosis including epistaxis secondary to trauma from nose picking, anterior nosebleed.  Have a very low suspicion for posterior nosebleed.     Labs (all labs ordered are listed, but only abnormal results are displayed) Labs interpreted as -    Labs Reviewed - No data to display    No significant bleeding at this time.  Given Afrin and pressure.  No significant bleeding do not feel that CBC is necessary.  Otherwise vitally stable.  Afrin and pressure.  Continued to have mild ongoing bleeding.  Small area to the left nare, silver nitrate, pressure after prolonged waiting in the emergency department no recurrent episodes of bleeding.  Sent home with Afrin and instructions of Afrin and pressure if her nose had recurrent epistaxis.  Discussed calling ENT for close follow-up as an outpatient and return precautions for any worsening symptoms.   PROCEDURES:  Critical Care performed: No  Epistaxis Management  Date/Time: 11/29/2023 8:19 AM  Performed by: Suzanne Kirsch, MD Authorized by: Suzanne Kirsch, MD   Consent:    Consent obtained:  Verbal   Consent given by:  Patient   Risks, benefits, and alternatives were discussed: yes     Risks discussed:  Pain, nasal injury and bleeding   Alternatives discussed:  Delayed treatment Universal protocol:    Procedure explained and questions answered to patient or proxy's satisfaction: yes     Required blood products, implants, devices, and special equipment available: yes     Patient identity confirmed:  Verbally with patient Procedure details:    Treatment site:  L septum   Treatment method:  Silver nitrate   Treatment complexity:  Limited    Treatment episode: initial   Post-procedure details:    Assessment:  Bleeding stopped   Procedure completion:  Tolerated   Patient's presentation is most consistent with acute complicated illness / injury requiring diagnostic workup.   MEDICATIONS ORDERED IN ED: Medications  oxymetazoline  (AFRIN) 0.05 % nasal spray 1 spray (1 spray Each Nare Given 11/29/23 0757)    FINAL CLINICAL IMPRESSION(S) / ED DIAGNOSES   Final diagnoses:  Epistaxis     Rx / DC Orders   ED Discharge Orders     None        Note:  This document was prepared using Dragon voice recognition software and may include unintentional dictation errors.   Suzanne Kirsch, MD 11/29/23 [phone]

## 2023-11-29 NOTE — ED Triage Notes (Signed)
 Pt to ED via EMS from group home, pt began to have nose bleed this morning, pt denies injury or pain. Pt take Eliquis , no bleeding at this time.

## 2023-11-29 NOTE — ED Notes (Signed)
 [facility] staff member at the bedside. Staff member advised she was going to be figuring out how the pt will be transported back to the group home.

## 2023-11-29 NOTE — ED Notes (Signed)
 Dr suzanne back to bedside, pt instructed to blow her nose, pt repositioned up in the bed in a more seated position. Pt able to blow a small clot out of her nose and afrin administered by dr suzanne, nos clamp reapplied and ice water  given to the pt

## 2023-11-29 NOTE — Discharge Instructions (Signed)
 Instructions for nose bleed: 1. Blow nose to get rid of any blood clots 2. Use 2 sprays of afrin (oxymetazoline ) to the side that is bleeding 3. Apply direct pressure over the squishy part of your nose, set a timer, hold pressure for 15-20 mins.  4. Lean forward to avoid blood draining down your throat.   If continues to bleed, return to the ER for re-evaluation. Follow up with your primary care doctor and call ENT for follow up.

## 2023-11-29 NOTE — ED Notes (Signed)
 This RN called [facility] to make sure that they were aware the pt was in the ER. Staff stated they would be sending someone over to sit with the pt

## 2023-11-29 NOTE — ED Notes (Signed)
 Group home staff states that a fleeta will be coming to pick her up due to her being on her personal vehicle

## 2023-11-30 ENCOUNTER — Emergency Department
Admission: EM | Admit: 2023-11-30 | Discharge: 2023-12-01 | Disposition: A | Discharge status: Home / Self Care | Attending: Emergency Medicine | Admitting: Emergency Medicine

## 2023-11-30 ENCOUNTER — Encounter: Payer: Self-pay | Admitting: Emergency Medicine

## 2023-11-30 ENCOUNTER — Other Ambulatory Visit: Payer: Self-pay

## 2023-11-30 DIAGNOSIS — R040 Epistaxis: Secondary | ICD-10-CM | POA: Insufficient documentation

## 2023-11-30 DIAGNOSIS — I2510 Atherosclerotic heart disease of native coronary artery without angina pectoris: Secondary | ICD-10-CM | POA: Insufficient documentation

## 2023-11-30 DIAGNOSIS — Z7901 Long term (current) use of anticoagulants: Secondary | ICD-10-CM | POA: Insufficient documentation

## 2023-11-30 DIAGNOSIS — I110 Hypertensive heart disease with heart failure: Secondary | ICD-10-CM | POA: Insufficient documentation

## 2023-11-30 DIAGNOSIS — I509 Heart failure, unspecified: Secondary | ICD-10-CM | POA: Insufficient documentation

## 2023-11-30 NOTE — ED Triage Notes (Signed)
 Pt presents to the ED via POV from group home with complaints of a nose bleed. She note being seen yesterday for same. The bleeding stopped and then restarted this evening around 4pm. Bleeding controlled at this time, nose clip in place. Afrin used x 2 PTA. A&Ox4 at this time. Denies CP or SOB.

## 2023-12-01 DIAGNOSIS — R040 Epistaxis: Secondary | ICD-10-CM | POA: Diagnosis not present

## 2023-12-01 MED ORDER — OXYMETAZOLINE HCL 0.05 % NA SOLN
1.0000 | Freq: Once | NASAL | Status: AC
  Administered 2023-12-01: 1 via NASAL
  Filled 2023-12-01: qty 30

## 2023-12-01 NOTE — ED Provider Notes (Signed)
 [facility] Provider Note    Event Date/Time   First MD Initiated Contact with Patient 12/01/23 0000     (approximate)   History   Epistaxis   HPI  Elizabeth Gutierrez is a 63 y.o. female   Past medical history of cognitive impairment, hypertension, hyperlipidemia, CHF and CAD, thyroid  disease, on apixaban  who presents to the emerged from with left-sided epistaxis.   Was seen yesterday in the emergency department for the same and achieved hemostasis and was discharged.  Caregiver notes that they tried Afrin and clamp but it did not work.  Independent Historian contributed to assessment above: Caregiver at bedside  External Medical Documents Reviewed: Prior hospital note      Physical Exam   Triage Vital Signs: ED Triage Vitals  Encounter Vitals Group     BP 11/30/23 2008 129/87     Girls Systolic BP Percentile --      Girls Diastolic BP Percentile --      Boys Systolic BP Percentile --      Boys Diastolic BP Percentile --      Pulse Rate 11/30/23 2008 90     Resp 11/30/23 2008 18     Temp 11/30/23 2008 97.7 F (36.5 C)     Temp Source 11/30/23 2008 Oral     SpO2 11/30/23 2008 99 %     Weight 11/30/23 2007 164 lb 14.5 oz (74.8 kg)     Height 11/30/23 2007 5' 5 (1.651 m)     Head Circumference --      Peak Flow --      Pain Score --      Pain Loc --      Pain Education --      Exclude from Growth Chart --     Most recent vital signs: Vitals:   11/30/23 2008 12/01/23 0008  BP: 129/87 (!) 134/99  Pulse: 90 77  Resp: 18 17  Temp: 97.7 F (36.5 C) 97.8 F (36.6 C)  SpO2: 99% 100%    General: Awake, no distress.  CV:  Good peripheral perfusion.  Resp:  Normal effort. Abd:  No distention.  Other:  Very pleasant 63 year old woman with a clamp on her nose.  When removed there is no dried blood in either nares.  No active bleeding.  Normal vital signs.   ED Results / Procedures / Treatments   Labs (all labs ordered are listed, but  only abnormal results are displayed) Labs Reviewed - No data to display   PROCEDURES:  Critical Care performed: No  Procedures   MEDICATIONS ORDERED IN ED: Medications  oxymetazoline  (AFRIN) 0.05 % nasal spray 1 spray (1 spray Each Nare Given by Other 12/01/23 0015)    IMPRESSION / MDM / ASSESSMENT AND PLAN / ED COURSE  I reviewed the triage vital signs and the nursing notes.                                Patient's presentation is most consistent with acute complicated illness / injury requiring diagnostic workup.  Differential diagnosis includes, but is not limited to, acute blood loss anemia, epistaxis, trauma   MDM: Epistaxis now resolved.  No obvious blood in the nares or ongoing bleeding at this time nor lesions amenable for cauterization.  I reemphasized the technique for Afrin, clamping, and anticipatory guidance given to the patient and caregiver.  I notified legal guardian via phone.  I considered hospitalization for admission or observation without evidence of ongoing bleeding, and is otherwise well-appearing woman, no evidence of acute blood loss anemia given her normal vital signs no active bleeding noted, I think she can be appropriate for outpatient management at this time.  Discharge.        FINAL CLINICAL IMPRESSION(S) / ED DIAGNOSES   Final diagnoses:  Left-sided epistaxis     Rx / DC Orders   ED Discharge Orders     None        Note:  This document was prepared using Dragon voice recognition software and may include unintentional dictation errors.    Cyrena Mylar, MD 12/01/23 [phone]

## 2023-12-01 NOTE — Discharge Instructions (Signed)
 If you have another nosebleed, blow your nose to dislodge any clots, then give 2 to 4 sprays of the oxymetazoline  to the affected nostril, and then apply clamp for 45 minutes.  This should stop the bleeding.  If ongoing bleeding, come back to the Emergency Department.  Thank you for choosing us  for your health care today!  Please see your primary doctor this week for a follow up appointment.   If you have any new, worsening, or unexpected symptoms call your doctor right away or come back to the emergency department for reevaluation.  It was my pleasure to care for you today.   Ginnie EDISON Cyrena, MD

## 2024-01-13 ENCOUNTER — Emergency Department
Admission: EM | Admit: 2024-01-13 | Discharge: 2024-01-13 | Disposition: A | Discharge status: Home / Self Care | Attending: Emergency Medicine | Admitting: Emergency Medicine

## 2024-01-13 ENCOUNTER — Emergency Department

## 2024-01-13 ENCOUNTER — Other Ambulatory Visit: Payer: Self-pay

## 2024-01-13 DIAGNOSIS — I509 Heart failure, unspecified: Secondary | ICD-10-CM | POA: Diagnosis not present

## 2024-01-13 DIAGNOSIS — S0003XA Contusion of scalp, initial encounter: Secondary | ICD-10-CM | POA: Insufficient documentation

## 2024-01-13 DIAGNOSIS — Z7901 Long term (current) use of anticoagulants: Secondary | ICD-10-CM | POA: Insufficient documentation

## 2024-01-13 DIAGNOSIS — I110 Hypertensive heart disease with heart failure: Secondary | ICD-10-CM | POA: Insufficient documentation

## 2024-01-13 DIAGNOSIS — S0990XA Unspecified injury of head, initial encounter: Secondary | ICD-10-CM

## 2024-01-13 DIAGNOSIS — I2510 Atherosclerotic heart disease of native coronary artery without angina pectoris: Secondary | ICD-10-CM | POA: Insufficient documentation

## 2024-01-13 DIAGNOSIS — W19XXXA Unspecified fall, initial encounter: Secondary | ICD-10-CM | POA: Insufficient documentation

## 2024-01-13 LAB — COMPREHENSIVE METABOLIC PANEL WITH GFR
ALT: 8 U/L (ref 0–44)
AST: 18 U/L (ref 15–41)
Albumin: 3.4 g/dL — ABNORMAL LOW (ref 3.5–5.0)
Alkaline Phosphatase: 93 U/L (ref 38–126)
Anion gap: 8 (ref 5–15)
BUN: 22 mg/dL (ref 8–23)
CO2: 23 mmol/L (ref 22–32)
Calcium: 9.2 mg/dL (ref 8.9–10.3)
Chloride: 108 mmol/L (ref 98–111)
Creatinine, Ser: 1.3 mg/dL — ABNORMAL HIGH (ref 0.44–1.00)
GFR, Estimated: 46 mL/min — ABNORMAL LOW (ref >60)
Glucose, Bld: 167 mg/dL — ABNORMAL HIGH (ref 70–99)
Potassium: 4.4 mmol/L (ref 3.5–5.1)
Sodium: 139 mmol/L (ref 135–145)
Total Bilirubin: 0.5 mg/dL (ref 0.0–1.2)
Total Protein: 6.6 g/dL (ref 6.5–8.1)

## 2024-01-13 LAB — CBC WITH DIFFERENTIAL/PLATELET
Abs Immature Granulocytes: 0.02 K/uL (ref 0.00–0.07)
Basophils Absolute: 0 K/uL (ref 0.0–0.1)
Basophils Relative: 1 %
Eosinophils Absolute: 0.1 K/uL (ref 0.0–0.5)
Eosinophils Relative: 1 %
HCT: 40.4 % (ref 36.0–46.0)
Hemoglobin: 11.4 g/dL — ABNORMAL LOW (ref 12.0–15.0)
Immature Granulocytes: 0 %
Lymphocytes Relative: 27 %
Lymphs Abs: 1.7 K/uL (ref 0.7–4.0)
MCH: 28.8 pg (ref 26.0–34.0)
MCHC: 28.2 g/dL — ABNORMAL LOW (ref 30.0–36.0)
MCV: 102 fL — ABNORMAL HIGH (ref 80.0–100.0)
Monocytes Absolute: 0.6 K/uL (ref 0.1–1.0)
Monocytes Relative: 9 %
Neutro Abs: 4 K/uL (ref 1.7–7.7)
Neutrophils Relative %: 62 %
Platelets: 244 K/uL (ref 150–400)
RBC: 3.96 MIL/uL (ref 3.87–5.11)
RDW: 14.6 % (ref 11.5–15.5)
WBC: 6.4 K/uL (ref 4.0–10.5)
nRBC: 0 % (ref 0.0–0.2)

## 2024-01-13 LAB — TROPONIN I (HIGH SENSITIVITY)
Troponin I (High Sensitivity): 15 ng/L (ref <18)
Troponin I (High Sensitivity): 16 ng/L (ref <18)

## 2024-01-13 NOTE — ED Notes (Signed)
 Pt given snack.

## 2024-01-13 NOTE — Discharge Instructions (Signed)
 CTs were obtained of the head and neck.  There are no concerning findings.  Return to the ED for new, worsening, or persistent severe headache, dizziness, recurrent falls, or any other new or worsening symptoms that are concerning.

## 2024-01-13 NOTE — ED Triage Notes (Addendum)
 Pt had unwitnessed fall today at group home, no loc, pt is on eliquis , hematoma on back of head that has been increasing in size. Skin is intact. Pt states it was mechanical fall.  83 111/60 90% RA

## 2024-01-13 NOTE — ED Notes (Signed)
 Bed alarm turned on, caregiver bedside.

## 2024-01-13 NOTE — ED Notes (Signed)
 Mother updated on tests at this time

## 2024-01-13 NOTE — ED Notes (Signed)
 Called lab to add on troponin

## 2024-01-13 NOTE — ED Provider Notes (Signed)
 [facility] Provider Note    Event Date/Time   First MD Initiated Contact with Patient 01/13/24 1226     (approximate)   History   No chief complaint on file.   HPI  Elizabeth Gutierrez is a 63 y.o. female with a history of CAD, CHF, hypertension, hyperlipidemia, thyroid  disease, and cognitive impairment, on apixaban , who presents with head injury.  The patient's caregiver at her group home states that the patient had a witnessed mechanical fall and hit her head.  She did not lose consciousness.  The patient reports pain to the back of her head.  She denies any other injuries.  She has no chest pain or difficulty breathing.  She is at her baseline mental status.  I reviewed the past medical records.  The patient's most recent prior encounter in our system was on 9/12 in the ED with epistaxis.  Her other most recent outpatient counter was on 8/8 with family medicine for knee pain after an injury.   Physical Exam   Triage Vital Signs: ED Triage Vitals  Encounter Vitals Group     BP 01/13/24 1222 119/69     Girls Systolic BP Percentile --      Girls Diastolic BP Percentile --      Boys Systolic BP Percentile --      Boys Diastolic BP Percentile --      Pulse Rate 01/13/24 1222 87     Resp 01/13/24 1222 18     Temp 01/13/24 1228 (!) 97.4 F (36.3 C)     Temp Source 01/13/24 1228 Oral     SpO2 01/13/24 1222 94 %     Weight --      Height --      Head Circumference --      Peak Flow --      Pain Score 01/13/24 1223 8     Pain Loc --      Pain Education --      Exclude from Growth Chart --     Most recent vital signs: Vitals:   01/13/24 1345 01/13/24 1515  BP:    Pulse: 78 87  Resp: 20 17  Temp:    SpO2: 95% 96%    General: Alert, comfortable appearing, no distress.  CV:  Good peripheral perfusion.  Resp:  Normal effort.  Abd:  No distention.  Other:  EOMI.  PERRLA.  No photophobia.  No facial droop.  Normal speech.  Motor intact in all  extremities.  No midline spinal tenderness.  Parietal hematoma.  No scalp laceration.   ED Results / Procedures / Treatments   Labs (all labs ordered are listed, but only abnormal results are displayed) Labs Reviewed  CBC WITH DIFFERENTIAL/PLATELET - Abnormal; Notable for the following components:      Result Value   Hemoglobin 11.4 (*)    MCV 102.0 (*)    MCHC 28.2 (*)    All other components within normal limits  COMPREHENSIVE METABOLIC PANEL WITH GFR - Abnormal; Notable for the following components:   Glucose, Bld 167 (*)    Creatinine, Ser 1.30 (*)    Albumin  3.4 (*)    GFR, Estimated 46 (*)    All other components within normal limits  TROPONIN I (HIGH SENSITIVITY)  TROPONIN I (HIGH SENSITIVITY)     EKG  ED ECG REPORT I, Waylon Cassis, the attending physician, personally viewed and interpreted this ECG.  Date: 01/13/2024 EKG Time: 1228 Rate: 82 Rhythm:  normal sinus rhythm QRS Axis: normal Intervals: RBBB ST/T Wave abnormalities: LVH with repolarization abnormality Narrative Interpretation: no evidence of acute ischemia read by machine is acute MI, however morphology is unchanged when compared to EKG of 06/26/2023   ED ECG REPORT I, Waylon Cassis, the attending physician, personally viewed and interpreted this ECG.  Date: 01/13/2024 EKG Time: 1258 Rate: 83 Rhythm: normal sinus rhythm QRS Axis: normal Intervals: RBBB ST/T Wave abnormalities: LVH with repolarization abnormality Narrative Interpretation: no evidence of acute ischemia; no dynamic changes when compared to EKG of 1228 today      RADIOLOGY  CT head: I independently viewed and interpreted the images; there is a right parietal scalp hematoma with no ICH.  Radiology report indicates no acute intracranial findings  CT cervical spine: No acute fracture   PROCEDURES:  Critical Care performed: No  Procedures   MEDICATIONS ORDERED IN ED: Medications - No data to  display   IMPRESSION / MDM / ASSESSMENT AND PLAN / ED COURSE  I reviewed the triage vital signs and the nursing notes.  63 year old female with cognitive impairment and other PMH as noted above presents after a mechanical fall from standing height with a parietal scalp hematoma.  She did not have loss of consciousness.  Neurologic exam is nonfocal.  EKG was read by the machine as acute MI.  The patient has LVH with repolarization abnormality, but no changes from prior EKGs.  There is no clinical evidence for acute MI.  Repeat EKG done 30 minutes later shows no dynamic changes.  Differential diagnosis includes, but is not limited to, minor head injury, concussion, ICH, cervical spine fracture.  Will obtain CT head and cervical spine, basic labs, troponins, and reassess.  Patient's presentation is most consistent with acute presentation with potential threat to life or bodily function.  The patient is on the cardiac monitor to evaluate for evidence of arrhythmia and/or significant heart rate changes.  ----------------------------------------- 3:42 PM on 01/13/2024 -----------------------------------------  CTs are negative.  CMP and CBC show no acute findings.  Troponins are negative x 2.  The patient remains asymptomatic.  She is stable for discharge back to her facility.  Return precautions have been provided and the caregiver expressed understanding.  FINAL CLINICAL IMPRESSION(S) / ED DIAGNOSES   Final diagnoses:  Minor head injury, initial encounter     Rx / DC Orders   ED Discharge Orders     None        Note:  This document was prepared using Dragon voice recognition software and may include unintentional dictation errors.    Cassis Waylon, MD 01/13/24 [phone]

## 2024-01-13 NOTE — ED Notes (Signed)
 This tech assisted pt to ambulate to toilet at this time.

## 2024-01-13 NOTE — ED Notes (Signed)
 Mother called and updated on pt condition and plan of care at this time.

## 2024-01-22 DIAGNOSIS — M25562 Pain in left knee: Secondary | ICD-10-CM | POA: Insufficient documentation

## 2024-02-06 ENCOUNTER — Ambulatory Visit: Admitting: Podiatry

## 2024-02-06 ENCOUNTER — Encounter: Payer: Self-pay | Admitting: Podiatry

## 2024-02-06 VITALS — Ht 65.0 in | Wt 164.9 lb

## 2024-02-06 DIAGNOSIS — E119 Type 2 diabetes mellitus without complications: Secondary | ICD-10-CM

## 2024-02-06 DIAGNOSIS — Z0189 Encounter for other specified special examinations: Secondary | ICD-10-CM | POA: Diagnosis not present

## 2024-02-06 NOTE — Progress Notes (Signed)
   Chief Complaint  Patient presents with   Diabetes    Pt is here for diabetic foot exam.    HPI: 63 y.o. female PMHx T2DM, HTN, CHF presenting today as a reestablish new patient for diabetic foot exam.  No pain to the feet.  She has her nails trimmed routinely by her nurse.  No complicating features  Past Medical History:  Diagnosis Date   CHF (congestive heart failure) (HCC)    Coronary artery disease    Diabetes (HCC)    Heart attack (HCC)    Hyperlipidemia    Hypertension    MR (mental retardation)    Mild   Paranoid schizophrenia (HCC)    Thyroid  disease     Past Surgical History:  Procedure Laterality Date   FOOT SURGERY Right     Allergies  Allergen Reactions   Bee Venom Anaphylaxis     Physical Exam: General: The patient is alert and oriented x3 in no acute distress.  Dermatology: Skin is warm, dry and supple bilateral lower extremities. Nails nicely debrided and WNL  Vascular: Palpable pedal pulses bilaterally. Capillary refill within normal limits.  No appreciable edema.  No erythema.  Neurological: Grossly intact via light touch  Musculoskeletal Exam: Pes planus bilateral. ROM WNL   Assessment/Plan of Care: 1.  Encounter for diabetic foot exam  -Patient evaluated -Maintain management for diabetes with PCP -No complicating features to the feet.  She has her nails routinely debrided by her nurse.  Continue -Return to clinic PRN       Thresa EMERSON Sar, DPM [facility]  Dr. Thresa EMERSON Sar, DPM    [address] [place], [place] [address]                Office [phone]  Fax [phone]

## 2024-02-20 ENCOUNTER — Emergency Department
Admission: EM | Admit: 2024-02-20 | Discharge: 2024-02-20 | Disposition: A | Discharge status: Home / Self Care | Attending: Emergency Medicine | Admitting: Emergency Medicine

## 2024-02-20 DIAGNOSIS — R519 Headache, unspecified: Secondary | ICD-10-CM | POA: Insufficient documentation

## 2024-02-20 DIAGNOSIS — W01198A Fall on same level from slipping, tripping and stumbling with subsequent striking against other object, initial encounter: Secondary | ICD-10-CM | POA: Insufficient documentation

## 2024-02-20 DIAGNOSIS — S8991XA Unspecified injury of right lower leg, initial encounter: Secondary | ICD-10-CM | POA: Diagnosis not present

## 2024-02-20 DIAGNOSIS — M546 Pain in thoracic spine: Secondary | ICD-10-CM | POA: Diagnosis not present

## 2024-02-20 DIAGNOSIS — S8992XA Unspecified injury of left lower leg, initial encounter: Secondary | ICD-10-CM | POA: Diagnosis present

## 2024-02-20 DIAGNOSIS — W19XXXA Unspecified fall, initial encounter: Secondary | ICD-10-CM

## 2024-02-20 MED ORDER — ACETAMINOPHEN 325 MG PO TABS
650.0000 mg | ORAL_TABLET | Freq: Once | ORAL | Status: AC
  Administered 2024-02-20: 650 mg via ORAL
  Filled 2024-02-20: qty 2

## 2024-02-20 NOTE — ED Notes (Addendum)
 This RN called pt's legal guardian/mother, Azeneth Carbonell. Pt's legal guardian/mother updated about discharge plan. Legal guardian gave permission for August (group home worker) to bring pt back to group home in company [place]. All questions answered at this time.

## 2024-02-20 NOTE — ED Provider Notes (Signed)
 [facility] Provider Note    Event Date/Time   First MD Initiated Contact with Patient 02/20/24 1924     (approximate)   History   Fall   HPI  Elizabeth Gutierrez is a 63 y.o. female presents to the emergency department from group home following a fall.  States that she got tripped up on her walker and fell forward hitting her knees.  Was initially complaining of a mild headache which brought her into the emergency department.  Not on anticoagulation.  Group home person with her states that her headache resolved whenever she got [employer].  Patient without complaints of a headache at this time.  Denies change in vision, slurring of speech or extremity numbness or weakness.  Denies any neck pain or significant pain to her knees.  Complaining of some mild upper back pain.  No shortness of breath.  No syncope and no loss of consciousness with her fall.     Physical Exam   Triage Vital Signs: ED Triage Vitals  Encounter Vitals Group     BP 02/20/24 1814 124/81     Girls Systolic BP Percentile --      Girls Diastolic BP Percentile --      Boys Systolic BP Percentile --      Boys Diastolic BP Percentile --      Pulse Rate 02/20/24 1814 95     Resp 02/20/24 1814 18     Temp 02/20/24 1814 99.1 F (37.3 C)     Temp Source 02/20/24 1814 Oral     SpO2 02/20/24 1814 93 %     Weight 02/20/24 1814 164 lb (74.4 kg)     Height 02/20/24 1814 5' 5 (1.651 m)     Head Circumference --      Peak Flow --      Pain Score 02/20/24 1827 0     Pain Loc --      Pain Education --      Exclude from Growth Chart --     Most recent vital signs: Vitals:   02/20/24 1814  BP: 124/81  Pulse: 95  Resp: 18  Temp: 99.1 F (37.3 C)  SpO2: 93%    Physical Exam HENT:     Head: Atraumatic.     Right Ear: External ear normal.     Left Ear: External ear normal.  Eyes:     Extraocular Movements: Extraocular movements intact.     Pupils: Pupils are equal, round, and reactive to  light.  Cardiovascular:     Rate and Rhythm: Normal rate.  Pulmonary:     Effort: No respiratory distress.  Chest:     Chest wall: No tenderness.  Abdominal:     General: There is no distension.     Tenderness: There is no abdominal tenderness.  Musculoskeletal:        General: No tenderness. Normal range of motion.     Cervical back: Normal range of motion and neck supple. No tenderness.     Comments: No midline thoracic or lumbar tenderness to palpation.  Mild tenderness to palpation with range of motion of the left knee but no significant palpable tenderness to palpation.  No joint effusion.  No significant chest wall tenderness.  No tenderness with range of motion to bilateral hips.  Neurological:     General: No focal deficit present.     Mental Status: She is alert.      IMPRESSION / MDM / ASSESSMENT  AND PLAN / ED COURSE  I reviewed the triage vital signs and the nursing notes.  Differential diagnosis including musculoskeletal pain, intracranial hemorrhage, concussion  Labs (all labs ordered are listed, but only abnormal results are displayed) Labs interpreted as -    Labs Reviewed - No data to display  Patient not on anticoagulation.  On my evaluation no headache and has normal neurologic exam.  No external signs of head trauma.  Have very low suspicion for intracranial hemorrhage.  No concern for basilar skull fracture.  No midline cervical spine tenderness, have a very low suspicion for cervical spine injury.  No midline thoracic or lumbar tenderness to palpation of low suspicion for back fracture.  Mild tenderness to palpation with range of motion of the left knee but have a low suspicion for fracture.  Low suspicion for ligamentous injury.  Will give Tylenol  for pain control.  Discussed symptomatic treatment and return precautions for any ongoing or worsening symptoms.     PROCEDURES:  Critical Care performed: No  Procedures  Patient's presentation is most  consistent with acute complicated illness / injury requiring diagnostic workup.   MEDICATIONS ORDERED IN ED: Medications  acetaminophen  (TYLENOL ) tablet 650 mg (has no administration in time range)    FINAL CLINICAL IMPRESSION(S) / ED DIAGNOSES   Final diagnoses:  Fall, initial encounter     Rx / DC Orders   ED Discharge Orders     None        Note:  This document was prepared using Dragon voice recognition software and may include unintentional dictation errors.   Suzanne Kirsch, MD 02/20/24 [phone]

## 2024-02-20 NOTE — ED Notes (Signed)
 Mother/Legal Guardian of pt, Joyce Yanik, updated about pt status.

## 2024-02-20 NOTE — ED Notes (Addendum)
 Caretaker from [facility], August, at bedside. Care handoff given to Autumn. Pt will be taken back to [facility] by August in company [place]. Permission for discharge plan given by mother/legal guardian, Joyce Billig.

## 2024-02-20 NOTE — ED Triage Notes (Signed)
 Pt presents to the ED from [facility] group home. Pt had a fall this morning. No LOC. No use of blood thinners. Per caregiver she has is at baseline. Hx of cognitive impairment. Pt denies pain.

## 2024-02-26 ENCOUNTER — Encounter: Payer: Self-pay | Admitting: Cardiology

## 2024-02-26 ENCOUNTER — Ambulatory Visit: Attending: Cardiology | Admitting: Cardiology

## 2024-02-26 VITALS — BP 110/68 | HR 77 | Ht 68.0 in | Wt 185.6 lb

## 2024-02-26 DIAGNOSIS — I2510 Atherosclerotic heart disease of native coronary artery without angina pectoris: Secondary | ICD-10-CM | POA: Diagnosis not present

## 2024-02-26 DIAGNOSIS — I10 Essential (primary) hypertension: Secondary | ICD-10-CM | POA: Diagnosis not present

## 2024-02-26 DIAGNOSIS — I5020 Unspecified systolic (congestive) heart failure: Secondary | ICD-10-CM

## 2024-02-26 NOTE — Progress Notes (Signed)
 Cardiology Office Note:    Date:  02/26/2024   ID:  Elizabeth Gutierrez, DOB [birth year], MRN [id number]  PCP:  Tobie Domino, MD   [facility] Providers Cardiologist:  None     Referring MD: Tobie Domino, MD   Chief Complaint  Patient presents with   New Patient (Initial Visit)    New pt has been doing well with no complaints of chest pain, chest pressure or SOB, medciation reviewed verbally with patient    History of Present Illness:    Elizabeth Gutierrez is a 63 y.o. female with a hx of HFrEF last EF 30%, CAD (LAD, LCx calcifications on chest CT), hypertension, hyperlipidemia, PE diagnosed in 12/23 cognitive impairment, schizophrenia, CKD presenting to establish care due to cardiomyopathy.  Patient lives in an assisted living facility for the past 27 years.  Presents with aide/caretaker.  She has chronic HFrEF, takes Eliquis  due to prior history of PE.  Needs help with activities of daily living, ambulation.  Denies any chest pain or shortness of breath.  Takes Eliquis , Coreg , Lasix  as prescribed.  Prior notes/testing Echo 03/2022 EF 25 to 30% Echo 2021 EF 30 to 35%. Echo 2018 EF 25 to 30%  Past Medical History:  Diagnosis Date   CHF (congestive heart failure) (HCC)    Coronary artery disease    Diabetes (HCC)    Heart attack (HCC)    Hyperlipidemia    Hypertension    MR (mental retardation)    Mild   Paranoid schizophrenia (HCC)    Thyroid  disease     Past Surgical History:  Procedure Laterality Date   FOOT SURGERY Right     Current Medications: Current Meds  Medication Sig   acetaminophen  (TYLENOL ) 325 MG tablet Take 650 mg by mouth every 4 (four) hours as needed for moderate pain, fever or headache.    apixaban  (ELIQUIS ) 5 MG TABS tablet Take 1 tablet (5 mg total) by mouth 2 (two) times daily.   ascorbic acid  (VITAMIN C ) 1000 MG tablet Take 1 tablet by mouth daily.   aspirin  81 MG tablet Take 81 mg by mouth daily.   atorvastatin  (LIPITOR) 20 MG tablet Take 20  mg by mouth daily.   BELSOMRA 15 MG TABS Take 1 tablet by mouth at bedtime.   benztropine  (COGENTIN ) 0.5 MG tablet Take 0.5 mg by mouth 2 (two) times daily.   carvedilol  (COREG ) 3.125 MG tablet Take 3.125 mg by mouth 2 (two) times daily.   divalproex  (DEPAKOTE ) 125 MG DR tablet Take 250 mg by mouth 2 (two) times daily.   docusate sodium  (COLACE) 100 MG capsule Take 100 mg by mouth 2 (two) times daily. As needed for mild constipation   donepezil  (ARICEPT ) 10 MG tablet Take 1 tablet by mouth at bedtime.   Dulaglutide (TRULICITY) 0.75 MG/0.5ML SOPN Inject 0.75 mg into the skin once a week.   fluPHENAZine  decanoate (PROLIXIN ) 25 MG/ML injection Inject 31.25 mg into the muscle every 14 (fourteen) days.    fluticasone  (FLONASE ) 50 MCG/ACT nasal spray Place 2 sprays into both nostrils daily.    furosemide  (LASIX ) 20 MG tablet Take 1 tablet (20 mg total) by mouth daily as needed for fluid or edema.   gabapentin  (NEURONTIN ) 100 MG capsule Take 200 mg by mouth 3 (three) times daily.   ibuprofen (ADVIL) 600 MG tablet Take 600 mg by mouth every 6 (six) hours as needed.   JARDIANCE 25 MG TABS tablet Take 25 mg by mouth daily.  ketoconazole (NIZORAL) 2 % cream Apply 1 application topically in the morning and at bedtime.   Lemborexant (DAYVIGO) 10 MG TABS Take 1 tablet by mouth at bedtime.   levothyroxine  (SYNTHROID ) 25 MCG tablet Take 1 tablet (25 mcg total) by mouth daily before breakfast.   metFORMIN  (GLUCOPHAGE -XR) 500 MG 24 hr tablet Take 500 mg by mouth daily with breakfast.   nystatin  cream (MYCOSTATIN ) Apply 1 application topically 2 (two) times daily.   omeprazole (PRILOSEC OTC) 20 MG tablet Take 20 mg by mouth daily.   omeprazole (PRILOSEC) 20 MG capsule Take 20 mg by mouth 2 (two) times daily.    polyethylene glycol (MIRALAX  / GLYCOLAX ) 17 g packet Take 17 g by mouth daily.    QUEtiapine  (SEROQUEL ) 100 MG tablet Take 100 mg by mouth daily. At noon   QUEtiapine  (SEROQUEL ) 200 MG tablet Take 600 mg  by mouth at bedtime.   RA NO FLUSH NIACIN 500 MG tablet Take 1,000 mg by mouth 2 (two) times daily.   senna (SENOKOT) 8.6 MG tablet Take 1 tablet by mouth daily.   sertraline  (ZOLOFT ) 100 MG tablet Take 100 mg by mouth in the morning.    zinc  sulfate 220 (50 Zn) MG capsule Take 220 mg by mouth daily.     Allergies:   Bee venom   Social History   Socioeconomic History   Marital status: Single    Spouse name: Not on file   Number of children: Not on file   Years of education: Not on file   Highest education level: Not on file  Occupational History   Not on file  Tobacco Use   Smoking status: Former    Current packs/day: 0.00    Types: Cigarettes    Quit date: 07/08/2013    Years since quitting: 10.6   Smokeless tobacco: Former  Substance and Sexual Activity   Alcohol use: No    Alcohol/week: 0.0 standard drinks of alcohol   Drug use: No   Sexual activity: Not on file  Other Topics Concern   Not on file  Social History Narrative   Not on file   Social Drivers of Health   Financial Resource Strain: Low Risk  (10/27/2023)   Received from [facility]   Overall Financial Resource Strain (CARDIA)    Difficulty of Paying Living Expenses: Not hard at all  Food Insecurity: No Food Insecurity (10/27/2023)   Received from [facility]   Hunger Vital Sign    Within the past 12 months, you worried that your food would run out before you got the money to buy more.: Never true    Within the past 12 months, the food you bought just didn't last and you didn't have money to get more.: Never true  Transportation Needs: No Transportation Needs (10/27/2023)   Received from [facility] - Transportation    In the past 12 months, has lack of transportation kept you from medical appointments or from getting medications?: No    Lack of Transportation (Non-Medical): No  Physical Activity: Not on file  Stress: Not on file  Social  Connections: Not on file     Family History: The patient's family history includes Alcohol abuse in her father. There is no history of Breast cancer.  ROS:   Please see the history of present illness.     All other systems reviewed and are negative.  EKGs/Labs/Other Studies Reviewed:    The following  studies were reviewed today:  EKG Interpretation Date/Time:  Monday February 26 2024 10:27:06 EST Ventricular Rate:  77 PR Interval:  172 QRS Duration:  156 QT Interval:  440 QTC Calculation: 497 R Axis:   -81  Text Interpretation: Normal sinus rhythm Right bundle branch block Left anterior fascicular block Bifascicular block Confirmed by Darliss Rogue ([id number]) on 02/26/2024 10:42:28 AM    Recent Labs: 01/13/2024: ALT 8; BUN 22; Creatinine, Ser 1.30; Hemoglobin 11.4; Platelets 244; Potassium 4.4; Sodium 139  Recent Lipid Panel    Component Value Date/Time   CHOL 110 04/07/2022 0456   CHOL 146 04/07/2011 0634   TRIG 98 04/07/2022 0456   TRIG 116 04/07/2011 0634   HDL 58 04/07/2022 0456   HDL 48 04/07/2011 0634   CHOLHDL 1.9 04/07/2022 0456   VLDL 20 04/07/2022 0456   VLDL 23 04/07/2011 0634   LDLCALC 32 04/07/2022 0456   LDLCALC 75 04/07/2011 0634     Risk Assessment/Calculations:             Physical Exam:    VS:  BP 110/68 (BP Location: Left Arm, Patient Position: Sitting, Cuff Size: Normal)   Pulse 77   Ht 5' 8 (1.727 m)   Wt 185 lb 9.6 oz (84.2 kg)   LMP  (LMP Unknown)   SpO2 97%   BMI 28.22 kg/m     Wt Readings from Last 3 Encounters:  02/26/24 185 lb 9.6 oz (84.2 kg)  02/20/24 164 lb (74.4 kg)  02/06/24 164 lb 14.4 oz (74.8 kg)     GEN:  Well nourished, well developed in no acute distress HEENT: Normal NECK: No JVD; No carotid bruits CARDIAC: RRR, no murmurs, rubs, gallops RESPIRATORY:  Clear to auscultation without rales, wheezing or rhonchi  ABDOMEN: Soft, non-tender, non-distended MUSCULOSKELETAL:  trace edema; No deformity  SKIN: Warm  and dry NEUROLOGIC:  Alert and oriented to person. PSYCHIATRIC:  Normal affect   ASSESSMENT:    1. HFrEF (heart failure with reduced ejection fraction) (HCC)   2. Coronary artery disease involving native coronary artery of native heart, unspecified whether angina present   3. Primary hypertension    PLAN:    In order of problems listed above:  HFrEF, appears euvolemic, NYHA class II symptoms.  Denies shortness of breath, trace edema with CKD.  Continue Lasix  20 mg daily, Coreg  3.125 mg twice daily. CAD, LAD, LCx calcifications on chest CT.  Denies chest pain.  Continue Eliquis , Lipitor 20 mg daily. Hypertension, BP controlled.  Continue Coreg  3.125 mg twice daily, Lasix  20 mg daily.  Follow-up in 1 year.     Medication Adjustments/Labs and Tests Ordered: Current medicines are reviewed at length with the patient today.  Concerns regarding medicines are outlined above.  Orders Placed This Encounter  Procedures   EKG 12-Lead   No orders of the defined types were placed in this encounter.   Patient Instructions  Medication Instructions:  Your physician recommends that you continue on your current medications as directed. Please refer to the Current Medication list given to you today.   *If you need a refill on your cardiac medications before your next appointment, please call your pharmacy*  Lab Work: No labs ordered today  If you have labs (blood work) drawn today and your tests are completely normal, you will receive your results only by: MyChart Message (if you have MyChart) OR A paper copy in the mail If you have any lab test that is abnormal or we need  to change your treatment, we will call you to review the results.  Testing/Procedures: No test ordered today   Follow-Up: At [facility], you and your health needs are our priority.  As part of our continuing mission to provide you with exceptional heart care, our providers are all part of one team.  This team  includes your primary Cardiologist (physician) and Advanced Practice Providers or APPs (Physician Assistants and Nurse Practitioners) who all work together to provide you with the care you need, when you need it.  Your next appointment:   1 year(s)  Provider:   You may see Dr Darliss or one of the following Advanced Practice Providers on your designated Care Team:   Lonni Meager, NP Lesley Maffucci, PA-C Bernardino Bring, PA-C Cadence [place], PA-C Tylene Lunch, NP Barnie Hila, NP    We recommend signing up for the patient portal called MyChart.  Sign up information is provided on this After Visit Summary.  MyChart is used to connect with patients for Virtual Visits (Telemedicine).  Patients are able to view lab/test results, encounter notes, upcoming appointments, etc.  Non-urgent messages can be sent to your provider as well.   To learn more about what you can do with MyChart, go to [email or web address].            Signed, Redell Darliss, MD  02/26/2024 11:29 AM    [facility]

## 2024-02-26 NOTE — Patient Instructions (Signed)
 Medication Instructions:  Your physician recommends that you continue on your current medications as directed. Please refer to the Current Medication list given to you today.   *If you need a refill on your cardiac medications before your next appointment, please call your pharmacy*  Lab Work: No labs ordered today  If you have labs (blood work) drawn today and your tests are completely normal, you will receive your results only by: MyChart Message (if you have MyChart) OR A paper copy in the mail If you have any lab test that is abnormal or we need to change your treatment, we will call you to review the results.  Testing/Procedures: No test ordered today   Follow-Up: At [facility], you and your health needs are our priority.  As part of our continuing mission to provide you with exceptional heart care, our providers are all part of one team.  This team includes your primary Cardiologist (physician) and Advanced Practice Providers or APPs (Physician Assistants and Nurse Practitioners) who all work together to provide you with the care you need, when you need it.  Your next appointment:   1 year(s)  Provider:   You may see Dr. Darliss or one of the following Advanced Practice Providers on your designated Care Team:   Lonni Meager, NP Lesley Maffucci, PA-C Bernardino Bring, PA-C Cadence [place], PA-C Tylene Lunch, NP Barnie Hila, NP    We recommend signing up for the patient portal called MyChart.  Sign up information is provided on this After Visit Summary.  MyChart is used to connect with patients for Virtual Visits (Telemedicine).  Patients are able to view lab/test results, encounter notes, upcoming appointments, etc.  Non-urgent messages can be sent to your provider as well.   To learn more about what you can do with MyChart, go to [email or web address].

## 2024-04-11 ENCOUNTER — Encounter: Payer: Self-pay | Admitting: Obstetrics and Gynecology

## 2024-04-11 ENCOUNTER — Ambulatory Visit: Admitting: Obstetrics and Gynecology

## 2024-04-11 VITALS — BP 120/78 | HR 103 | Wt 185.0 lb

## 2024-04-11 DIAGNOSIS — Z01419 Encounter for gynecological examination (general) (routine) without abnormal findings: Secondary | ICD-10-CM

## 2024-04-11 DIAGNOSIS — Z0000 Encounter for general adult medical examination without abnormal findings: Secondary | ICD-10-CM

## 2024-04-11 NOTE — Progress Notes (Addendum)
 Obstetrics and Gynecology New Patient Evaluation  Appointment Date: 04/11/2024  OBGYN Clinic: [facility] for [facility]  Primary Care Provider: Tobie Domino  Referring Provider: [facility]  Chief Complaint:  Chief Complaint  Patient presents with   Gynecologic Exam    History of Present Illness: Elizabeth Gutierrez is a 64 y.o.  G0P0000 (No LMP recorded (lmp unknown). Patient is postmenopausal.), seen for the above chief complaint. Her past medical history is significant for mental retardation, lives in group home.    Patient sent by home's supervising RN for GYN exam. Patient here with two caretakers and they deny and they spoke to the RN and no GYN issues concerns today and, specifically, no bleeding or discharge or odor or itching.   Review of Systems: Pertinent items are noted in HPI.   Patient Active Problem List   Diagnosis Date Noted   Acute hypoxic respiratory failure (HCC) 04/07/2022   Hypotension 04/07/2022   Toxic metabolic encephalopathy 04/07/2022   Somnolence 04/06/2022   Sepsis due to gram-negative UTI (HCC) 03/08/2022   Generalized weakness 03/08/2022   Anxiety and depression 03/08/2022   Essential hypertension 03/08/2022   Acute conjunctivitis 03/08/2022   Malnutrition of moderate degree 11/28/2019   Goals of care, counseling/discussion    Palliative care by specialist    DNR (do not resuscitate) discussion    Hypercalcemia    Tachycardia    Weakness    Acute kidney injury superimposed on CKD    COVID-19 11/22/2019   Bilateral pneumonia 09/28/2019   Shortness of breath 09/26/2019   Hyperlipidemia    Paranoid schizophrenia (HCC)    Type 2 diabetes mellitus with hyperlipidemia (HCC)    Hypothyroidism    Chronic kidney disease, stage 3a (HCC)    Acute on chronic respiratory failure with hypoxia (HCC)    Acute metabolic encephalopathy 03/20/2019   Altered mental state 08/17/2016   DDD (degenerative disc disease), lumbar  07/09/2015   Facet syndrome, lumbar 07/09/2015   Lumbar radiculopathy 07/09/2015   Sacroiliac joint dysfunction 07/09/2015   Spinal stenosis, lumbar region, 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   Diabetic neuropathy (HCC) 07/09/2015   CAD in native artery 06/16/2015   Controlled type 2 diabetes mellitus without complication (HCC) 06/16/2015   Benign essential HTN 06/16/2015   Obstructive apnea 01/05/2015   Esophagitis, reflux 08/06/2014   Edema leg 05/14/2014   Chronic combined systolic (congestive) and diastolic (congestive) heart failure (HCC) 05/14/2014   TI (tricuspid incompetence) 05/14/2014   Chest pain 12/13/2013   Combined fat and carbohydrate induced hyperlipemia 10/09/2013   Breath shortness 10/09/2013   Past Medical History:  Past Medical History:  Diagnosis Date   CHF (congestive heart failure) (HCC)    Coronary artery disease    Diabetes (HCC)    Heart attack (HCC)    Hyperlipidemia    Hypertension    MR (mental retardation)    Mild   Paranoid schizophrenia (HCC)    Thyroid  disease    Past Surgical History:  Past Surgical History:  Procedure Laterality Date   FOOT SURGERY Right    Past Obstetrical History:  OB History  Gravida Para Term Preterm AB Living  0 0 0 0 0 0  SAB IAB Ectopic Multiple Live Births  0 0 0 0 0   Past Gynecological History: As per HPI.  Social History:  Social History   Socioeconomic History   Marital status: Single    Spouse name: Not on file   Number of children: Not on  file   Years of education: Not on file   Highest education level: Not on file  Occupational History   Not on file  Tobacco Use   Smoking status: Former    Current packs/day: 0.00    Types: Cigarettes    Quit date: 07/08/2013    Years since quitting: 10.7   Smokeless tobacco: Former  Substance and Sexual Activity   Alcohol use: No    Alcohol/week: 0.0 standard drinks of alcohol   Drug use: No   Sexual activity: Not on file  Other Topics Concern    Not on file  Social History Narrative   Not on file   Social Drivers of Health   Tobacco Use: Medium Risk (02/26/2024)   Patient History    Smoking Tobacco Use: Former    Smokeless Tobacco Use: Former    Passive Exposure: Not on Actuary Strain: Low Risk  (10/27/2023)   Received from [facility]   Overall Financial Resource Strain (CARDIA)    Difficulty of Paying Living Expenses: Not hard at all  Food Insecurity: No Food Insecurity (10/27/2023)   Received from [facility]   Epic    Within the past 12 months, you worried that your food would run out before you got the money to buy more.: Never true    Within the past 12 months, the food you bought just didn't last and you didn't have money to get more.: Never true  Transportation Needs: No Transportation Needs (10/27/2023)   Received from [facility] - Transportation    In the past 12 months, has lack of transportation kept you from medical appointments or from getting medications?: No    Lack of Transportation (Non-Medical): No  Physical Activity: Not on file  Stress: Not on file  Social Connections: Not on file  Intimate Partner Violence: Not At Risk (03/08/2022)   Humiliation, Afraid, Rape, and Kick questionnaire    Fear of Current or Ex-Partner: No    Emotionally Abused: No    Physically Abused: No    Sexually Abused: No  Depression (PHQ2-9): Not on file  Alcohol Screen: Not on file  Housing: Low Risk  (10/27/2023)   Received from [facility]   Epic    In the last 12 months, was there a time when you were not able to pay the mortgage or rent on time?: No    In the past 12 months, how many times have you moved where you were living?: 0    At any time in the past 12 months, were you homeless or living in a shelter (including now)?: No  Utilities: Not At Risk (10/27/2023)   Received from [facility]   Epic    In the  past 12 months has the electric, gas, oil, or water  company threatened to shut off services in your home?: No  Health Literacy: Not on file   Family History:  Family History  Problem Relation Age of Onset   Alcohol abuse Father    Breast cancer Neg Hx    Health Maintenance:  Mammogram(s): Yes.   Date: 08/2023  Medications Itzel D. Astacio had no medications administered during this visit. Current Outpatient Medications  Medication Sig Dispense Refill   acetaminophen  (TYLENOL ) 325 MG tablet Take 650 mg by mouth every 4 (four) hours as needed for moderate pain, fever or headache.      apixaban  (ELIQUIS ) 5 MG  TABS tablet Take 1 tablet (5 mg total) by mouth 2 (two) times daily.     ascorbic acid  (VITAMIN C ) 1000 MG tablet Take 1 tablet by mouth daily.     aspirin  81 MG tablet Take 81 mg by mouth daily.     atorvastatin  (LIPITOR) 20 MG tablet Take 20 mg by mouth daily.     BELSOMRA 15 MG TABS Take 1 tablet by mouth at bedtime.     benztropine  (COGENTIN ) 0.5 MG tablet Take 0.5 mg by mouth 2 (two) times daily.     carvedilol  (COREG ) 3.125 MG tablet Take 3.125 mg by mouth 2 (two) times daily.     divalproex  (DEPAKOTE ) 125 MG DR tablet Take 250 mg by mouth 2 (two) times daily.     docusate sodium  (COLACE) 100 MG capsule Take 100 mg by mouth 2 (two) times daily. As needed for mild constipation     donepezil  (ARICEPT ) 10 MG tablet Take 1 tablet by mouth at bedtime.     Dulaglutide (TRULICITY) 0.75 MG/0.5ML SOPN Inject 0.75 mg into the skin once a week.     fluPHENAZine  decanoate (PROLIXIN ) 25 MG/ML injection Inject 31.25 mg into the muscle every 14 (fourteen) days.      fluticasone  (FLONASE ) 50 MCG/ACT nasal spray Place 2 sprays into both nostrils daily.      furosemide  (LASIX ) 20 MG tablet Take 1 tablet (20 mg total) by mouth daily as needed for fluid or edema. 30 tablet 0   gabapentin  (NEURONTIN ) 100 MG capsule Take 200 mg by mouth 3 (three) times daily.     ibuprofen (ADVIL) 600 MG tablet Take  600 mg by mouth every 6 (six) hours as needed.     JARDIANCE 25 MG TABS tablet Take 25 mg by mouth daily.     ketoconazole (NIZORAL) 2 % cream Apply 1 application topically in the morning and at bedtime.     Lemborexant (DAYVIGO) 10 MG TABS Take 1 tablet by mouth at bedtime.     levothyroxine  (SYNTHROID ) 25 MCG tablet Take 1 tablet (25 mcg total) by mouth daily before breakfast.     metFORMIN  (GLUCOPHAGE -XR) 500 MG 24 hr tablet Take 500 mg by mouth daily with breakfast.     nystatin  cream (MYCOSTATIN ) Apply 1 application topically 2 (two) times daily. 30 g 0   omeprazole (PRILOSEC OTC) 20 MG tablet Take 20 mg by mouth daily.     omeprazole (PRILOSEC) 20 MG capsule Take 20 mg by mouth 2 (two) times daily.      polyethylene glycol (MIRALAX  / GLYCOLAX ) 17 g packet Take 17 g by mouth daily.      QUEtiapine  (SEROQUEL ) 100 MG tablet Take 100 mg by mouth daily. At noon     QUEtiapine  (SEROQUEL ) 200 MG tablet Take 600 mg by mouth at bedtime.     RA NO FLUSH NIACIN 500 MG tablet Take 1,000 mg by mouth 2 (two) times daily.     senna (SENOKOT) 8.6 MG tablet Take 1 tablet by mouth daily.     sertraline  (ZOLOFT ) 100 MG tablet Take 100 mg by mouth in the morning.      zinc  sulfate 220 (50 Zn) MG capsule Take 220 mg by mouth daily.     No current facility-administered medications for this visit.   Allergies Bee venom  Physical Exam:  BP 120/78   Pulse (!) 103   Wt 185 lb (83.9 kg)   LMP  (LMP Unknown)   BMI 28.13 kg/m  Body  mass index is 28.13 kg/m. General appearance: Well nourished, well developed female in no acute distress.  Respiratory:  Normal respiratory effort Abdomen:no masses, hernias; diffusely non tender to palpation, non distended Neuro/Psych:  Normal mood and affect.  Skin:  Warm and dry.   Pelvic exam: exam declined by the patient.  Laboratory: none  Radiology: none  Assessment: patient doing well  Plan:  1. Encounter for well woman exam without gynecological exam  (Primary) Exam declined by patient. Since asymptomatic and no issues, I don't feel putting her through exam is worthwhile. If signs or symptoms ever appear, I said to let us  know and can try and do exam but would likely need exam under anesthesia in the OR.   RTC PRN  Future Appointments  Date Time Provider Department Center  04/24/2024 10:00 AM Isaiah Scrivener, MD [id number]    Bebe Izell Raddle MD Attending [facility])

## 2024-04-19 ENCOUNTER — Encounter: Payer: Self-pay | Admitting: Internal Medicine

## 2024-04-24 ENCOUNTER — Ambulatory Visit: Admitting: Internal Medicine

## 2024-04-24 ENCOUNTER — Encounter: Payer: Self-pay | Admitting: Internal Medicine

## 2024-04-24 VITALS — BP 110/60 | HR 81 | Temp 98.5°F | Ht 69.0 in | Wt 185.0 lb

## 2024-04-24 DIAGNOSIS — I2699 Other pulmonary embolism without acute cor pulmonale: Secondary | ICD-10-CM | POA: Diagnosis not present

## 2024-04-24 DIAGNOSIS — Z87891 Personal history of nicotine dependence: Secondary | ICD-10-CM

## 2024-04-24 NOTE — Progress Notes (Signed)
 "  Name: Elizabeth Gutierrez MRN: [id number] DOB: 03/16/1961    CHIEF COMPLAINT:  Assessment of pulmonary embolism and continuation of oral anticoagulation   HISTORY OF PRESENT ILLNESS: 64 y.o. female with a hx of HFrEF last EF 30%, CAD (LAD, LCx calcifications on chest CT), hypertension, hyperlipidemia, PE diagnosed in 12/23 cognitive impairment, schizophrenia, CKD    Patient lives in an assisted living facility for the past 27 years.  She has chronic HFrEF, takes Eliquis  due to prior history of PE. Needs help with activities of daily living, ambulation. Denies any chest pain or shortness of breath. Takes Eliquis , Coreg , Lasix  as prescribed.   Patient has severe neurocognitive dysfunction Patient lives in a group home Patient uses a walker  Patient was accompanied by healthcare providers at the group home Since November patient has 3-4 falls at home Since November patient has had 3-4 episodes of nosebleeds Patient had 1 ER visit for nosebleed in the last several months  Based on this history and the fact that in January 2024 there was no evidence of bilateral PE I would recommend that we stop Eliquis  at this time as the risk of bleeding outweighs the benefits  Patient does not have any respiratory issues at this time No shortness of breath no chest pain Vital signs are stable in the office   Patient would not wear CPAP or oxygen therapy if we were to test for No indication for testing at this time     PAST MEDICAL HISTORY :   has a past medical history of CHF (congestive heart failure) (HCC), Coronary artery disease, Diabetes (HCC), Encounter for therapeutic drug level monitoring (12/20/2013), Encounter for therapeutic drug monitoring (11/02/2023), Heart attack (HCC), Hyperlipidemia, Hypertension, MR (mental retardation), Other specified counseling (10/27/2023), Paranoid schizophrenia (HCC), Pulmonary embolism (HCC) (03/16/2022), and Thyroid  disease.  has a past surgical history that  includes Foot surgery (Right). Prior to Admission medications  Medication Sig Start Date End Date Taking? Authorizing Provider  Alcohol Swabs (B-D SINGLE USE SWABS REGULAR) PADS BD SWAB SINGLE USE REGULAR PADS 11/26/20  Yes [provider]  Blood Glucose Monitoring Suppl (TRUE METRIX METER) DEVI True Metrix Glucose Meter 03/29/23  Yes [provider]  diclofenac Sodium (VOLTAREN) 1 % GEL Apply 2 g topically 4 (four) times daily. 10/27/23 01/17/25 Yes [provider]  ELIQUIS  2.5 MG TABS tablet Take 2.5 mg by mouth 2 (two) times daily. 03/28/24  Yes [provider]  Embrace Lancets Ultra Thin 30G MISC EMBRACE LANCETS ULTRA THIN 30G 03/29/23  Yes [provider]  glucose blood (TRUE METRIX BLOOD GLUCOSE TEST) test strip 1 each as needed. 04/14/22  Yes [provider]  LINZESS 145 MCG CAPS capsule Take 145 mcg by mouth daily before breakfast. 03/28/24  Yes [provider]  memantine (NAMENDA) 10 MG tablet Take 10 mg by mouth 2 (two) times daily. 03/26/24  Yes [provider]  MOUNJARO 2.5 MG/0.5ML Pen Inject 2.5 mg into the skin once a week. 03/28/24  Yes [provider]  QUEtiapine  (SEROQUEL  XR) 200 MG 24 hr tablet Take 200 mg by mouth daily. 03/28/24  Yes [provider]  QUEtiapine  (SEROQUEL  XR) 50 MG TB24 24 hr tablet Take 50 mg by mouth daily. 03/26/24  Yes [provider]  REFRESH RELIEVA PF 0.5-1 % SOLN REFRESH RELIEVA PF 0.5-1 % SOLN 12/05/23  Yes [provider]  RSV bivalent vaccine (ABRYSVO) 120 MCG/0.5ML injection Inject 0.5 mLs into the muscle once. 12/07/23  Yes [provider]  Zoster Vaccine Adjuvanted [facility]) injection Inject 0.5 mLs into the muscle once. 12/07/23  Yes [provider]  acetaminophen  (TYLENOL ) 325 MG tablet Take 650 mg by mouth every 4 (four) hours as needed for moderate pain, fever or headache.     [provider]  ascorbic acid  (VITAMIN C ) 1000 MG tablet  Take 1 tablet by mouth daily.    [provider]  aspirin  81 MG tablet Take 81 mg by mouth daily.    [provider]  atorvastatin  (LIPITOR) 20 MG tablet Take 20 mg by mouth daily.    [provider]  BELSOMRA 15 MG TABS Take 1 tablet by mouth at bedtime. 05/05/21   [provider]  benztropine  (COGENTIN ) 0.5 MG tablet Take 0.5 mg by mouth 2 (two) times daily.    [provider]  carboxymethylcellulose (REFRESH PLUS) 0.5 % SOLN Apply 2 drops to eye daily as needed.    [provider]  carvedilol  (COREG ) 3.125 MG tablet Take 3.125 mg by mouth 2 (two) times daily. 12/26/18   [provider]  divalproex  (DEPAKOTE ) 250 MG DR tablet Take 250 mg by mouth 2 (two) times daily.    [provider]  docusate sodium  (COLACE) 100 MG capsule Take 100 mg by mouth 2 (two) times daily. As needed for mild constipation    [provider]  donepezil  (ARICEPT ) 10 MG tablet Take 1 tablet by mouth at bedtime. 04/07/21   [provider]  Dulaglutide (TRULICITY) 0.75 MG/0.5ML SOPN Inject 0.75 mg into the skin once a week.    [provider]  fluPHENAZine  decanoate (PROLIXIN ) 25 MG/ML injection Inject 31.25 mg into the muscle every 14 (fourteen) days.     [provider]  fluticasone  (FLONASE ) 50 MCG/ACT nasal spray Place 2 sprays into both nostrils daily.     [provider]  furosemide  (LASIX ) 20 MG tablet Take 1 tablet (20 mg total) by mouth daily as needed for fluid or edema. 10/01/19 04/11/24  Jens Durand, MD  gabapentin  (NEURONTIN ) 100 MG capsule Take 200 mg by mouth 3 (three) times daily.    [provider]  ibuprofen (ADVIL) 600 MG tablet Take 600 mg by mouth every 6 (six) hours as needed.    [provider]  JARDIANCE 25 MG TABS tablet Take 25 mg by mouth daily.    [provider]  ketoconazole (NIZORAL) 2 % cream Apply 1 application topically in the morning and at bedtime.  04/08/21   [provider]  Lemborexant (DAYVIGO) 10 MG TABS Take 1 tablet by mouth at bedtime.    [provider]  levothyroxine  (SYNTHROID ) 25 MCG tablet Take 1 tablet (25 mcg total) by mouth daily before breakfast. 12/07/19   Awanda City, MD  metFORMIN  (GLUCOPHAGE -XR) 500 MG 24 hr tablet Take 500 mg by mouth daily with breakfast.    [provider]  nystatin  cream (MYCOSTATIN ) Apply 1 application topically 2 (two) times daily. 12/07/19   Awanda City, MD  omeprazole (PRILOSEC OTC) 20 MG tablet Take 20 mg by mouth daily.    [provider]  omeprazole (PRILOSEC) 20 MG capsule Take 20 mg by mouth 2 (two) times daily.  12/26/18   [provider]  polyethylene glycol (MIRALAX  / GLYCOLAX ) 17 g packet Take 17 g by mouth daily.     [provider]  RA NO FLUSH NIACIN 500 MG tablet Take 1,000 mg by mouth 2 (two) times daily. 02/18/22   [provider]  ramelteon (ROZEREM) 8 MG tablet Take 8 mg by mouth at bedtime.    [provider]  REXULTI 2 MG TABS tablet Take 2 mg by mouth daily.    [provider]  senna (SENOKOT) 8.6 MG tablet Take 1 tablet by mouth daily.    [provider]  sertraline  (ZOLOFT ) 100 MG tablet Take 100 mg by mouth in the morning.     [provider]  zinc  sulfate 220 (50 Zn) MG capsule Take 220 mg by mouth daily.    [provider]   Allergies[1]  FAMILY HISTORY:  family history includes Alcohol abuse in her father. SOCIAL HISTORY:  reports that she quit smoking about 10 years ago. Her smoking use included cigarettes. She has quit using smokeless tobacco. She reports that she does not drink alcohol and does not use drugs.    BP 110/60   Pulse 81   Temp 98.5 F (36.9 C)   Ht 5' 9 (1.753 m)   Wt 185 lb (83.9 kg)   LMP  (LMP Unknown)   SpO2 96%   BMI 27.32 kg/m     Review of Systems: Gen:  Denies  fever, sweats, chills weight loss  HEENT: Denies blurred vision,  double vision, ear pain, eye pain, hearing loss, nose bleeds, sore throat Cardiac:  No dizziness, chest pain or heaviness, chest tightness,edema, No JVD Resp:   No cough, -sputum production, -shortness of breath,-wheezing, -hemoptysis,  Other:  All other systems negative   Physical Examination:   General Appearance: No distress  EYES PERRLA, EOM intact.   NECK Supple, No JVD Pulmonary: normal breath sounds, No wheezing.  CardiovascularNormal S1,S2.  No m/r/g.   Abdomen: Benign, Soft, non-tender. Neurology UE/LE 5/5 strength, no focal deficits Ext pulses intact, cap refill intact ALL OTHER ROS ARE NEGATIVE     ASSESSMENT AND PLAN SYNOPSIS  64 year old pleasant white female with neurocognitive deficiency lives in a group home with a history of DVT in 2023 currently on Eliquis  with a 39-month history of recurrent falls and recurrent nosebleeds  After further evaluation and review of CT scans, I recommend that patient do not continue with oral anticoagulation due to high fall risk and high bleeding risk  Patient does not have any significant respiratory issues at this time Patient uses Lasix  intermittently based on her swelling and weight gain   CURRENT MEDICATIONS REVIEWED AT LENGTH WITH PATIENT TODAY   Patient  satisfied with Plan of action and management. All questions answered   Follow up 6 months   I spent a total of 65 minutes dedicated to the care of this patient on the date of this encounter to include pre-visit review of records, face-to-face time with the patient discussing conditions above, post visit ordering of testing, clinical documentation with the electronic health record, making appropriate referrals as documented, and communicating necessary information to the patient's healthcare team.     Nickolas Alm Cellar, M.D.  [facility]  Medical Director [facility] [place]         [1]  Allergies Allergen Reactions   Bee  Venom Anaphylaxis   "

## 2024-04-24 NOTE — Patient Instructions (Signed)
" ° °  PLEASE STOP TAKING ELIQUIS !!! HIGH RISK FOR BLEEDING!   "
